# Patient Record
Sex: Female | Born: 1945
Health system: Southern US, Community
[De-identification: ages and names within clinical notes are randomized; demographics above are authoritative.]

## PROBLEM LIST (undated history)

## (undated) DIAGNOSIS — M1990 Unspecified osteoarthritis, unspecified site: Secondary | ICD-10-CM

## (undated) DIAGNOSIS — R0981 Nasal congestion: Secondary | ICD-10-CM

## (undated) DIAGNOSIS — I10 Essential (primary) hypertension: Secondary | ICD-10-CM

## (undated) DIAGNOSIS — M810 Age-related osteoporosis without current pathological fracture: Secondary | ICD-10-CM

## (undated) DIAGNOSIS — E7800 Pure hypercholesterolemia, unspecified: Secondary | ICD-10-CM

## (undated) DIAGNOSIS — M5126 Other intervertebral disc displacement, lumbar region: Secondary | ICD-10-CM

## (undated) DIAGNOSIS — R0600 Dyspnea, unspecified: Secondary | ICD-10-CM

## (undated) DIAGNOSIS — K219 Gastro-esophageal reflux disease without esophagitis: Secondary | ICD-10-CM

## (undated) HISTORY — PX: COLONOSCOPY: SHX174

## (undated) HISTORY — DX: Age-related osteoporosis without current pathological fracture: M81.0

## (undated) HISTORY — PX: OTHER SURGICAL HISTORY: SHX169

## (undated) HISTORY — PX: CATARACT EXTRACTION: SUR2

---

## 1998-07-19 ENCOUNTER — Other Ambulatory Visit: Admission: RE | Admit: 1998-07-19 | Discharge: 1998-07-19 | Payer: Self-pay | Admitting: Family Medicine

## 2008-08-12 LAB — HM COLONOSCOPY

## 2008-12-27 ENCOUNTER — Ambulatory Visit (HOSPITAL_COMMUNITY): Admission: RE | Admit: 2008-12-27 | Discharge: 2008-12-27 | Payer: Self-pay | Admitting: Ophthalmology

## 2010-08-20 LAB — HEMOGLOBIN AND HEMATOCRIT, BLOOD
HCT: 38.2 % (ref 36.0–46.0)
Hemoglobin: 13.2 g/dL (ref 12.0–15.0)

## 2010-08-20 LAB — BASIC METABOLIC PANEL
BUN: 12 mg/dL (ref 6–23)
GFR calc Af Amer: >60 mL/min (ref >60)
GFR calc non Af Amer: >60 mL/min (ref >60)
Potassium: 4.2 mEq/L (ref 3.5–5.1)
Sodium: 140 mEq/L (ref 135–145)

## 2011-04-15 ENCOUNTER — Emergency Department (HOSPITAL_COMMUNITY)
Admission: EM | Admit: 2011-04-15 | Discharge: 2011-04-15 | Disposition: A | Discharge status: Home / Self Care | Payer: Medicare Other | Attending: Emergency Medicine | Admitting: Emergency Medicine

## 2011-04-15 ENCOUNTER — Emergency Department (HOSPITAL_COMMUNITY): Payer: Medicare Other

## 2011-04-15 DIAGNOSIS — R42 Dizziness and giddiness: Secondary | ICD-10-CM | POA: Insufficient documentation

## 2011-04-15 DIAGNOSIS — E7800 Pure hypercholesterolemia, unspecified: Secondary | ICD-10-CM | POA: Insufficient documentation

## 2011-04-15 DIAGNOSIS — I10 Essential (primary) hypertension: Secondary | ICD-10-CM | POA: Insufficient documentation

## 2011-04-15 DIAGNOSIS — R51 Headache: Secondary | ICD-10-CM | POA: Insufficient documentation

## 2011-04-15 DIAGNOSIS — Z9849 Cataract extraction status, unspecified eye: Secondary | ICD-10-CM | POA: Insufficient documentation

## 2011-04-15 DIAGNOSIS — R509 Fever, unspecified: Secondary | ICD-10-CM | POA: Insufficient documentation

## 2011-04-15 DIAGNOSIS — K219 Gastro-esophageal reflux disease without esophagitis: Secondary | ICD-10-CM | POA: Insufficient documentation

## 2011-04-15 DIAGNOSIS — R112 Nausea with vomiting, unspecified: Secondary | ICD-10-CM | POA: Insufficient documentation

## 2011-04-15 HISTORY — DX: Gastro-esophageal reflux disease without esophagitis: K21.9

## 2011-04-15 HISTORY — DX: Essential (primary) hypertension: I10

## 2011-04-15 HISTORY — DX: Pure hypercholesterolemia, unspecified: E78.00

## 2011-04-15 HISTORY — DX: Nasal congestion: R09.81

## 2011-04-15 LAB — URINE MICROSCOPIC-ADD ON

## 2011-04-15 LAB — URINALYSIS, ROUTINE W REFLEX MICROSCOPIC
Nitrite: NEGATIVE
Protein, ur: NEGATIVE mg/dL
Urobilinogen, UA: 0.2 mg/dL (ref 0.0–1.0)

## 2011-04-15 MED ORDER — MECLIZINE HCL 25 MG PO TABS: 25.0000 mg | ORAL_TABLET | Freq: Three times a day (TID) | ORAL | Status: AC | PRN

## 2011-04-15 MED ORDER — MECLIZINE HCL 12.5 MG PO TABS
25.0000 mg | ORAL_TABLET | Freq: Once | ORAL | Status: AC
  Administered 2011-04-15: 25 mg via ORAL
  Filled 2011-04-15: qty 2

## 2011-04-15 NOTE — ED Notes (Signed)
Pt became dizzy while driving today, multiple other complaints, shaky at times, legs hurt, face felt hot, pressure from ear to ear--thinks may be sinus?Marland Kitchen

## 2011-04-15 NOTE — ED Provider Notes (Signed)
History  Scribed for Joya Gaskins, MD, the patient was seen in [facility]. The chart was scribed by Gilman Schmidt. The patients care was started at 9:52 PM.   CSN: [id number] Arrival date & time: 04/15/2011  9:27 PM   First MD Initiated Contact with Patient 04/15/11 2140      Chief Complaint  Patient presents with  . Dizziness    multiple other complaints     HPI Cheryl Lee is a 65 y.o. female who presents to the Emergency Department complaining of dizziness. Patient reports loss of balance ~6 hours ago and becoming dizzy while driving today. Patient also reports amultiple other complaints such as leg pain, face feeling hot, and headache (pressure). Pt has never had similar symptoms. Pt also notes numbness in left arm that has improved. Denies any LOC, vision loss, hearing loss, vomiting, or weakness in extremities. Denies any history or MI or stroke. There are no other associated symptoms and no other alleviating or aggravating factors.    Past Medical History  Diagnosis Date  . Hypercholesteremia   . Hypertension   . Acid reflux   . Sinus congestion     Past Surgical History  Procedure Date  . Cataract extraction     No family history on file.  History  Substance Use Topics  . Smoking status: Never Smoker   . Smokeless tobacco: Not on file  . Alcohol Use: No    Review of Systems  Constitutional: Positive for fever.  HENT: Negative for hearing loss.   Eyes: Negative for visual disturbance.  Gastrointestinal: Positive for nausea and vomiting.  Neurological: Positive for dizziness and headaches. Negative for syncope and weakness.  All other systems reviewed and are negative.    Allergies  Review of patient's allergies indicates not on file.  Home Medications  No current outpatient prescriptions on file.  BP 141/84  Pulse 81  Temp(Src) 98.6 F (37 C) (Oral)  Resp 17  Ht 5\' 2"  (1.575 m)  Wt 121 lb (54.885 kg)  BMI 22.13 kg/m2  SpO2 99%  Physical  Exam CONSTITUTIONAL: Well developed/well nourished HEAD AND FACE: Normocephalic/atraumatic EYES: EOMI/PERRL ENMT: Mucous membranes moist, left TM/right TM normal NECK: supple no meningeal signs, no bruits SPINE:entire spine nontender CV: S1/S2 noted, no murmurs/rubs/gallops noted LUNGS: Lungs are clear to auscultation bilaterally, no apparent distress ABDOMEN: soft, nontender, no rebound or guarding GU:no cva tenderness NEURO: no past pointing, no ataxia  Awake/alert, facies symmetric, no arm or leg drift is noted Cranial nerves 3/4/5/6/11/19/08/11/12 tested and intact Gait normal EXTREMITIES: pulses normal, full ROM SKIN: warm, color normal PSYCH: no abnormalities of mood noted   ED Course  Procedures   LABS Results for orders placed during the hospital encounter of 04/15/11  URINALYSIS, ROUTINE W REFLEX MICROSCOPIC      Component Value Range   Color, Urine YELLOW  YELLOW    APPearance CLEAR  CLEAR    Specific Gravity, Urine <1.005 (*) 1.005 - 1.030    pH 6.0  5.0 - 8.0    Glucose, UA NEGATIVE  NEGATIVE (mg/dL)   Hgb urine dipstick TRACE (*) NEGATIVE    Bilirubin Urine NEGATIVE  NEGATIVE    Ketones, ur TRACE (*) NEGATIVE (mg/dL)   Protein, ur NEGATIVE  NEGATIVE (mg/dL)   Urobilinogen, UA 0.2  0.0 - 1.0 (mg/dL)   Nitrite NEGATIVE  NEGATIVE    Leukocytes, UA TRACE (*) NEGATIVE   URINE MICROSCOPIC-ADD ON      Component Value Range  Squamous Epithelial / LPF FEW (*) RARE    WBC, UA 7-10  <3 (WBC/hpf)   RBC / HPF 3-6  <3 (RBC/hpf)   Bacteria, UA FEW (*) RARE    Radiology:  IMPRESSION: No acute intracranial findings. Original Report Authenticated By: Gerrianne Scale, M.D.   DIAGNOSTIC STUDIES: Oxygen Saturation is 100% on room air, normal by my interpretation.    COORDINATION OF CARE: 9:52pm:  - Patient evaluated by ED physician, CT Head, UA, Urine microscopic ordered   Pt well appearing, report symptoms worsened by sitting up/changing position Acute in onset,  normal gait, no motor deficits, likely peripheral vertigo  MDM  Nursing notes reviewed and considered in documentation All labs/vitals reviewed and considered   I personally performed the services described in this documentation, which was scribed in my presence. The recorded information has been reviewed and considered.          Joya Gaskins, MD 04/15/11 [phone]

## 2011-05-17 DIAGNOSIS — E785 Hyperlipidemia, unspecified: Secondary | ICD-10-CM | POA: Diagnosis not present

## 2011-05-22 DIAGNOSIS — S82899A Other fracture of unspecified lower leg, initial encounter for closed fracture: Secondary | ICD-10-CM | POA: Diagnosis not present

## 2011-06-21 DIAGNOSIS — S82899A Other fracture of unspecified lower leg, initial encounter for closed fracture: Secondary | ICD-10-CM | POA: Diagnosis not present

## 2011-06-26 DIAGNOSIS — E785 Hyperlipidemia, unspecified: Secondary | ICD-10-CM | POA: Diagnosis not present

## 2011-06-26 DIAGNOSIS — I10 Essential (primary) hypertension: Secondary | ICD-10-CM | POA: Diagnosis not present

## 2011-06-26 DIAGNOSIS — E559 Vitamin D deficiency, unspecified: Secondary | ICD-10-CM | POA: Diagnosis not present

## 2011-06-26 DIAGNOSIS — R350 Frequency of micturition: Secondary | ICD-10-CM | POA: Diagnosis not present

## 2011-07-11 DIAGNOSIS — M810 Age-related osteoporosis without current pathological fracture: Secondary | ICD-10-CM | POA: Diagnosis not present

## 2011-09-06 DIAGNOSIS — Z1231 Encounter for screening mammogram for malignant neoplasm of breast: Secondary | ICD-10-CM | POA: Diagnosis not present

## 2011-09-06 LAB — HM MAMMOGRAPHY: HM Mammogram: NEGATIVE

## 2011-09-28 DIAGNOSIS — I10 Essential (primary) hypertension: Secondary | ICD-10-CM | POA: Diagnosis not present

## 2011-09-28 DIAGNOSIS — Z23 Encounter for immunization: Secondary | ICD-10-CM | POA: Diagnosis not present

## 2011-09-28 DIAGNOSIS — E785 Hyperlipidemia, unspecified: Secondary | ICD-10-CM | POA: Diagnosis not present

## 2012-01-01 DIAGNOSIS — I10 Essential (primary) hypertension: Secondary | ICD-10-CM | POA: Diagnosis not present

## 2012-01-01 DIAGNOSIS — E559 Vitamin D deficiency, unspecified: Secondary | ICD-10-CM | POA: Diagnosis not present

## 2012-01-01 DIAGNOSIS — E785 Hyperlipidemia, unspecified: Secondary | ICD-10-CM | POA: Diagnosis not present

## 2012-04-04 DIAGNOSIS — E785 Hyperlipidemia, unspecified: Secondary | ICD-10-CM | POA: Diagnosis not present

## 2012-04-04 DIAGNOSIS — M810 Age-related osteoporosis without current pathological fracture: Secondary | ICD-10-CM | POA: Diagnosis not present

## 2012-04-04 DIAGNOSIS — I10 Essential (primary) hypertension: Secondary | ICD-10-CM | POA: Diagnosis not present

## 2012-04-04 LAB — HEPATIC FUNCTION PANEL
AST: 18 U/L (ref 13–35)
Bilirubin, Direct: 0.1 mg/dL (ref 0.01–0.4)

## 2012-04-04 LAB — BASIC METABOLIC PANEL
BUN: 21 mg/dL (ref 4–21)
Glucose: 98 mg/dL
Sodium: 138 mmol/L (ref 137–147)

## 2012-04-04 LAB — LIPID PANEL
HDL: 59 mg/dL (ref 35–70)
LDL Cholesterol: 58 mg/dL

## 2012-05-16 DIAGNOSIS — H2510 Age-related nuclear cataract, unspecified eye: Secondary | ICD-10-CM | POA: Diagnosis not present

## 2012-07-19 ENCOUNTER — Encounter: Payer: Self-pay | Admitting: Nurse Practitioner

## 2012-07-19 DIAGNOSIS — I10 Essential (primary) hypertension: Secondary | ICD-10-CM | POA: Insufficient documentation

## 2012-07-19 DIAGNOSIS — E785 Hyperlipidemia, unspecified: Secondary | ICD-10-CM | POA: Insufficient documentation

## 2012-07-19 LAB — HM DEXA SCAN: HM DEXA SCAN: NORMAL

## 2012-08-04 ENCOUNTER — Encounter: Payer: Self-pay | Admitting: Nurse Practitioner

## 2012-08-04 ENCOUNTER — Ambulatory Visit (INDEPENDENT_AMBULATORY_CARE_PROVIDER_SITE_OTHER): Payer: Medicare Other | Admitting: Nurse Practitioner

## 2012-08-04 VITALS — BP 139/83 | HR 78 | Temp 99.0°F | Ht 60.0 in | Wt 133.0 lb

## 2012-08-04 DIAGNOSIS — K219 Gastro-esophageal reflux disease without esophagitis: Secondary | ICD-10-CM

## 2012-08-04 DIAGNOSIS — E785 Hyperlipidemia, unspecified: Secondary | ICD-10-CM | POA: Diagnosis not present

## 2012-08-04 DIAGNOSIS — M810 Age-related osteoporosis without current pathological fracture: Secondary | ICD-10-CM | POA: Diagnosis not present

## 2012-08-04 DIAGNOSIS — I10 Essential (primary) hypertension: Secondary | ICD-10-CM | POA: Diagnosis not present

## 2012-08-04 DIAGNOSIS — Z1231 Encounter for screening mammogram for malignant neoplasm of breast: Secondary | ICD-10-CM

## 2012-08-04 LAB — COMPLETE METABOLIC PANEL WITH GFR
AST: 14 U/L (ref 0–37)
Alkaline Phosphatase: 55 U/L (ref 39–117)
BUN: 20 mg/dL (ref 6–23)
Creat: 0.86 mg/dL (ref 0.50–1.10)

## 2012-08-04 NOTE — Progress Notes (Signed)
  Subjective:    Patient ID: Cheryl Lee, female    DOB: 04-Feb-1946, 67 y.o.   MRN: [id number]  Hypertension This is a chronic problem. The current episode started more than 1 year ago. The problem is unchanged. The problem is controlled. Associated symptoms include peripheral edema. Pertinent negatives include no anxiety, blurred vision, chest pain, malaise/fatigue, neck pain, palpitations or shortness of breath. There are no associated agents to hypertension. Risk factors for coronary artery disease include dyslipidemia. Past treatments include ACE inhibitors and diuretics. The current treatment provides significant improvement. There are no compliance problems.   Hyperlipidemia This is a chronic problem. The current episode started more than 1 year ago. The problem is controlled. Recent lipid tests were reviewed and are normal. There are no known factors aggravating her hyperlipidemia. Pertinent negatives include no chest pain, myalgias or shortness of breath. Current antihyperlipidemic treatment includes statins. The current treatment provides significant improvement of lipids. There are no compliance problems.  Risk factors for coronary artery disease include hypertension.  OSTEOPOROSIS Currently on alendronate. Tolerating well. Last DEXA was 07/11/11 GERD Currently on omeprazole and paient says if doesn't take then she will start vomiting after meals  Review of Systems  Constitutional: Negative for malaise/fatigue.  HENT: Negative for neck pain.   Eyes: Negative for blurred vision.  Respiratory: Negative for shortness of breath.   Cardiovascular: Negative for chest pain and palpitations.  Musculoskeletal: Negative for myalgias.  All other systems reviewed and are negative.       Objective:   Physical Exam  Vitals reviewed. Constitutional: She is oriented to person, place, and time. She appears well-developed and well-nourished.  HENT:  Head: Normocephalic.  Right Ear: External  ear normal.  Left Ear: External ear normal.  Nose: Nose normal.  Mouth/Throat: Oropharynx is clear and moist.  Eyes: Pupils are equal, round, and reactive to light.  Neck: Normal range of motion. Neck supple. No JVD present.  Cardiovascular: Normal rate, regular rhythm, normal heart sounds and intact distal pulses.  Exam reveals no gallop and no friction rub.   No murmur heard. No peripheral edema bil  Pulmonary/Chest: Effort normal and breath sounds normal.  Abdominal: Soft. Bowel sounds are normal.  Musculoskeletal: Normal range of motion. She exhibits no edema.  Neurological: She is alert and oriented to person, place, and time.  Skin: Skin is warm and dry.  Psychiatric: She has a normal mood and affect. Her behavior is normal. Judgment and thought content normal.  BP 139/83  Pulse 78  Temp(Src) 99 F (37.2 C) (Oral)  Ht 5' (1.524 m)  Wt 133 lb (60.328 kg)  BMI 25.97 kg/m2  LMP 08/05/1991         Assessment & Plan:  1. Essential hypertension, benig - COMPLETE METABOLIC PANEL WITH GFR  2. Other and unspecified hyperlipidemi - NMR Lipoprofile with Lipids  3. Osteoporosis, unspecifie  4. GERD (gastroesophageal reflux diseas  5. Other screening mammogra - MM Digital Screening; Future  Continue all meds Labs pending Diet encouraged F/U 3 months and prn  Mary-Margaret Daphine Deutscher, FNP

## 2012-08-04 NOTE — Patient Instructions (Signed)
Hypertriglyceridemia  Diet for High blood levels of Triglycerides Most fats in food are triglycerides. Triglycerides in your blood are stored as fat in your body. High levels of triglycerides in your blood may put you at a greater risk for heart disease and stroke.  Normal triglyceride levels are less than 150 mg/dL. Borderline high levels are 150-199 mg/dl. High levels are 200 - 499 mg/dL, and very high triglyceride levels are greater than 500 mg/dL. The decision to treat high triglycerides is generally based on the level. For people with borderline or high triglyceride levels, treatment includes weight loss and exercise. Drugs are recommended for people with very high triglyceride levels. Many people who need treatment for high triglyceride levels have metabolic syndrome. This syndrome is a collection of disorders that often include: insulin resistance, high blood pressure, blood clotting problems, high cholesterol and triglycerides. TESTING PROCEDURE FOR TRIGLYCERIDES  You should not eat 4 hours before getting your triglycerides measured. The normal range of triglycerides is between 10 and 250 milligrams per deciliter (mg/dl). Some people may have extreme levels (1000 or above), but your triglyceride level may be too high if it is above 150 mg/dl, depending on what other risk factors you have for heart disease.  People with high blood triglycerides may also have high blood cholesterol levels. If you have high blood cholesterol as well as high blood triglycerides, your risk for heart disease is probably greater than if you only had high triglycerides. High blood cholesterol is one of the main risk factors for heart disease. CHANGING YOUR DIET  Your weight can affect your blood triglyceride level. If you are more than 20% above your ideal body weight, you may be able to lower your blood triglycerides by losing weight. Eating less and exercising regularly is the best way to combat this. Fat provides more  calories than any other food. The best way to lose weight is to eat less fat. Only 30% of your total calories should come from fat. Less than 7% of your diet should come from saturated fat. A diet low in fat and saturated fat is the same as a diet to decrease blood cholesterol. By eating a diet lower in fat, you may lose weight, lower your blood cholesterol, and lower your blood triglyceride level.  Eating a diet low in fat, especially saturated fat, may also help you lower your blood triglyceride level. Ask your dietitian to help you figure how much fat you can eat based on the number of calories your caregiver has prescribed for you.  Exercise, in addition to helping with weight loss may also help lower triglyceride levels.   Alcohol can increase blood triglycerides. You may need to stop drinking alcoholic beverages.  Too much carbohydrate in your diet may also increase your blood triglycerides. Some complex carbohydrates are necessary in your diet. These may include bread, rice, potatoes, other starchy vegetables and cereals.  Reduce "simple" carbohydrates. These may include pure sugars, candy, honey, and jelly without losing other nutrients. If you have the kind of high blood triglycerides that is affected by the amount of carbohydrates in your diet, you will need to eat less sugar and less high-sugar foods. Your caregiver can help you with this.  Adding 2-4 grams of fish oil (EPA+ DHA) may also help lower triglycerides. Speak with your caregiver before adding any supplements to your regimen. Following the Diet  Maintain your ideal weight. Your caregivers can help you with a diet. Generally, eating less food and getting more   exercise will help you lose weight. Joining a weight control group may also help. Ask your caregivers for a good weight control group in your area.  Eat low-fat foods instead of high-fat foods. This can help you lose weight too.  These foods are lower in fat. Eat MORE of these:    Dried beans, peas, and lentils.  Egg whites.  Low-fat cottage cheese.  Fish.  Lean cuts of meat, such as round, sirloin, rump, and flank (cut extra fat off meat you fix).  Whole grain breads, cereals and pasta.  Skim and nonfat dry milk.  Low-fat yogurt.  Poultry without the skin.  Cheese made with skim or part-skim milk, such as mozzarella, parmesan, farmers', ricotta, or pot cheese. These are higher fat foods. Eat LESS of these:   Whole milk and foods made from whole milk, such as American, blue, cheddar, monterey jack, and swiss cheese  High-fat meats, such as luncheon meats, sausages, knockwurst, bratwurst, hot dogs, ribs, corned beef, ground pork, and regular ground beef.  Fried foods. Limit saturated fats in your diet. Substituting unsaturated fat for saturated fat may decrease your blood triglyceride level. You will need to read package labels to know which products contain saturated fats.  These foods are high in saturated fat. Eat LESS of these:   Fried pork skins.  Whole milk.  Skin and fat from poultry.  Palm oil.  Butter.  Shortening.  Cream cheese.  Bacon.  Margarines and baked goods made from listed oils.  Vegetable shortenings.  Chitterlings.  Fat from meats.  Coconut oil.  Palm kernel oil.  Lard.  Cream.  Sour cream.  Fatback.  Coffee whiteners and non-dairy creamers made with these oils.  Cheese made from whole milk. Use unsaturated fats (both polyunsaturated and monounsaturated) moderately. Remember, even though unsaturated fats are better than saturated fats; you still want a diet low in total fat.  These foods are high in unsaturated fat:   Canola oil.  Sunflower oil.  Mayonnaise.  Almonds.  Peanuts.  Pine nuts.  Margarines made with these oils.  Safflower oil.  Olive oil.  Avocados.  Cashews.  Peanut butter.  Sunflower seeds.  Soybean oil.  Peanut  oil.  Olives.  Pecans.  Walnuts.  Pumpkin seeds. Avoid sugar and other high-sugar foods. This will decrease carbohydrates without decreasing other nutrients. Sugar in your food goes rapidly to your blood. When there is excess sugar in your blood, your liver may use it to make more triglycerides. Sugar also contains calories without other important nutrients.  Eat LESS of these:   Sugar, brown sugar, powdered sugar, jam, jelly, preserves, honey, syrup, molasses, pies, candy, cakes, cookies, frosting, pastries, colas, soft drinks, punches, fruit drinks, and regular gelatin.  Avoid alcohol. Alcohol, even more than sugar, may increase blood triglycerides. In addition, alcohol is high in calories and low in nutrients. Ask for sparkling water, or a diet soft drink instead of an alcoholic beverage. Suggestions for planning and preparing meals   Bake, broil, grill or roast meats instead of frying.  Remove fat from meats and skin from poultry before cooking.  Add spices, herbs, lemon juice or vinegar to vegetables instead of salt, rich sauces or gravies.  Use a non-stick skillet without fat or use no-stick sprays.  Cool and refrigerate stews and broth. Then remove the hardened fat floating on the surface before serving.  Refrigerate meat drippings and skim off fat to make low-fat gravies.  Serve more fish.  Use less butter,   margarine and other high-fat spreads on bread or vegetables.  Use skim or reconstituted non-fat dry milk for cooking.  Cook with low-fat cheeses.  Substitute low-fat yogurt or cottage cheese for all or part of the sour cream in recipes for sauces, dips or congealed salads.  Use half yogurt/half mayonnaise in salad recipes.  Substitute evaporated skim milk for cream. Evaporated skim milk or reconstituted non-fat dry milk can be whipped and substituted for whipped cream in certain recipes.  Choose fresh fruits for dessert instead of high-fat foods such as pies or  cakes. Fruits are naturally low in fat. When Dining Out   Order low-fat appetizers such as fruit or vegetable juice, pasta with vegetables or tomato sauce.  Select clear, rather than cream soups.  Ask that dressings and gravies be served on the side. Then use less of them.  Order foods that are baked, broiled, poached, steamed, stir-fried, or roasted.  Ask for margarine instead of butter, and use only a small amount.  Drink sparkling water, unsweetened tea or coffee, or diet soft drinks instead of alcohol or other sweet beverages. QUESTIONS AND ANSWERS ABOUT OTHER FATS IN THE BLOOD: SATURATED FAT, TRANS FAT, AND CHOLESTEROL What is trans fat? Trans fat is a type of fat that is formed when vegetable oil is hardened through a process called hydrogenation. This process helps makes foods more solid, gives them shape, and prolongs their shelf life. Trans fats are also called hydrogenated or partially hydrogenated oils.  What do saturated fat, trans fat, and cholesterol in foods have to do with heart disease? Saturated fat, trans fat, and cholesterol in the diet all raise the level of LDL "bad" cholesterol in the blood. The higher the LDL cholesterol, the greater the risk for coronary heart disease (CHD). Saturated fat and trans fat raise LDL similarly.  What foods contain saturated fat, trans fat, and cholesterol? High amounts of saturated fat are found in animal products, such as fatty cuts of meat, chicken skin, and full-fat dairy products like butter, whole milk, cream, and cheese, and in tropical vegetable oils such as palm, palm kernel, and coconut oil. Trans fat is found in some of the same foods as saturated fat, such as vegetable shortening, some margarines (especially hard or stick margarine), crackers, cookies, baked goods, fried foods, salad dressings, and other processed foods made with partially hydrogenated vegetable oils. Small amounts of trans fat also occur naturally in some animal  products, such as milk products, beef, and lamb. Foods high in cholesterol include liver, other organ meats, egg yolks, shrimp, and full-fat dairy products. How can I use the new food label to make heart-healthy food choices? Check the Nutrition Facts panel of the food label. Choose foods lower in saturated fat, trans fat, and cholesterol. For saturated fat and cholesterol, you can also use the Percent Daily Value (%DV): 5% DV or less is low, and 20% DV or more is high. (There is no %DV for trans fat.) Use the Nutrition Facts panel to choose foods low in saturated fat and cholesterol, and if the trans fat is not listed, read the ingredients and limit products that list shortening or hydrogenated or partially hydrogenated vegetable oil, which tend to be high in trans fat. POINTS TO REMEMBER:   Discuss your risk for heart disease with your caregivers, and take steps to reduce risk factors.  Change your diet. Choose foods that are low in saturated fat, trans fat, and cholesterol.  Add exercise to your daily routine if   it is not already being done. Participate in physical activity of moderate intensity, like brisk walking, for at least 30 minutes on most, and preferably all days of the week. No time? Break the 30 minutes into three, 10-minute segments during the day.  Stop smoking. If you do smoke, contact your caregiver to discuss ways in which they can help you quit.  Do not use street drugs.  Maintain a normal weight.  Maintain a healthy blood pressure.  Keep up with your blood work for checking the fats in your blood as directed by your caregiver. Document Released: 02/16/2004 Document Revised: 10/30/2011 Document Reviewed: 09/13/2008 [employer] Patient Information 2013 [employer].  

## 2012-08-07 LAB — NMR LIPOPROFILE WITH LIPIDS
Cholesterol, Total: 159 mg/dL (ref <200)
LDL (calc): 63 mg/dL (ref <100)
LDL Particle Number: 846 nmol/L (ref <1000)
LP-IR Score: 46 — ABNORMAL HIGH (ref <=45)
Small LDL Particle Number: 483 nmol/L (ref <=527)
Triglycerides: 144 mg/dL (ref <150)
VLDL Size: 44.8 nm (ref >=46.6)

## 2012-10-01 DIAGNOSIS — Z1231 Encounter for screening mammogram for malignant neoplasm of breast: Secondary | ICD-10-CM | POA: Diagnosis not present

## 2012-10-07 ENCOUNTER — Telehealth: Payer: Self-pay | Admitting: Nurse Practitioner

## 2012-10-07 ENCOUNTER — Ambulatory Visit (INDEPENDENT_AMBULATORY_CARE_PROVIDER_SITE_OTHER): Payer: Medicare Other

## 2012-10-07 ENCOUNTER — Encounter: Payer: Self-pay | Admitting: Physician Assistant

## 2012-10-07 ENCOUNTER — Ambulatory Visit (INDEPENDENT_AMBULATORY_CARE_PROVIDER_SITE_OTHER): Payer: Medicare Other | Admitting: Physician Assistant

## 2012-10-07 VITALS — BP 119/89 | HR 102 | Temp 97.8°F

## 2012-10-07 DIAGNOSIS — S8990XA Unspecified injury of unspecified lower leg, initial encounter: Secondary | ICD-10-CM

## 2012-10-07 DIAGNOSIS — S82409A Unspecified fracture of shaft of unspecified fibula, initial encounter for closed fracture: Secondary | ICD-10-CM | POA: Diagnosis not present

## 2012-10-07 DIAGNOSIS — S99911A Unspecified injury of right ankle, initial encounter: Secondary | ICD-10-CM

## 2012-10-07 DIAGNOSIS — S99919A Unspecified injury of unspecified ankle, initial encounter: Secondary | ICD-10-CM | POA: Diagnosis not present

## 2012-10-07 DIAGNOSIS — S99929A Unspecified injury of unspecified foot, initial encounter: Secondary | ICD-10-CM | POA: Diagnosis not present

## 2012-10-07 DIAGNOSIS — S82401A Unspecified fracture of shaft of right fibula, initial encounter for closed fracture: Secondary | ICD-10-CM

## 2012-10-07 NOTE — Telephone Encounter (Signed)
Apt made

## 2012-10-07 NOTE — Progress Notes (Signed)
Subjective:     Patient ID: Cheryl Lee, female   DOB: [birth year], 67 y.o.   MRN: [id number]  HPI Pt was mowing lawn Sat when she stepped in a hole twisting the R foot/ankle Pt noted pain and swelling to the area She has soaked and taken Tylenol for pain relief Sx are sig worse and to the lateral ankle with weight bearing Due to cont sx here for review  Review of Systems  All other systems reviewed and are negative.      Objective:   Physical Exam + edema/ecchy to the R foot/ankle lateral Good pulses Sensory intact Decrease in ROM due to sx ++ TTP distal fibula No TTP medial ankle, foot, or Achilles Xray- + fx     Assessment:     1. Closed fibular fracture, right, initial encounter   2. Right ankle injury, initial encounter        Plan:     Referral to Dr Simonne Come per pt request She has walking boot at home  No weight bearing She states Tylenol is enough for her sx Work note- out until further notice F/U prn

## 2012-10-07 NOTE — Patient Instructions (Signed)
Fibular Fracture, Ankle, Adult, Treated with or without Immobilization  You have a fracture (break) of your fibula. This is the bone in your lower leg located on the outside of the leg. These fractures are easily diagnosed with x-rays.  TREATMENT   You have a simple fracture of the part of the fibula which is located between the knee and ankle. This bone usually will heal without problems and can often be treated without casting or splinting. This means the fracture will heal well during normal use and daily activities without being held in place. Sometimes a cast or splint is placed on these fractures if it is needed for comfort or if the bones are badly out of place.  HOME CARE INSTRUCTIONS    Apply ice to the injury for 15-20 minutes, 3-4 times per day while awake, for 2 days. Put the ice in a plastic bag and place a thin towel between the bag of ice and your leg. This helps keep swelling down.   Use crutches as directed. Resume walking without crutches as directed by your caregiver or when comfortable doing so.   Only take over-the-counter or prescription medicines for pain, discomfort, or fever as directed by your caregiver.   Keep appointments for follow up x-rays if these are required.   If you have a removable splint or boot, do not remove the boot unless directed by your caregiver.   Warning: Do not drive a car or operate a motor vehicle until your caregiver specifically tells you it is safe to do so.  SEEK MEDICAL CARE IF:    You have continued severe pain or more swelling   The medications do not control the pain.   Your skin or nails below the injury turn blue or grey or feel cold or numb.   You develop severe pain in the leg or foot.  MAKE SURE YOU:    Understand these instructions.   Will watch your condition.   Will get help right away if you are not doing well or get worse.  Document Released: 04/30/2005 Document Revised: 07/23/2011 Document Reviewed: 12/05/2007  [employer] Patient  Information 2014 [employer].

## 2012-10-08 DIAGNOSIS — S8263XA Displaced fracture of lateral malleolus of unspecified fibula, initial encounter for closed fracture: Secondary | ICD-10-CM | POA: Diagnosis not present

## 2012-10-10 ENCOUNTER — Telehealth: Payer: Self-pay | Admitting: Nurse Practitioner

## 2012-10-10 NOTE — Telephone Encounter (Signed)
Patient aware.

## 2012-10-14 DIAGNOSIS — Y999 Unspecified external cause status: Secondary | ICD-10-CM | POA: Diagnosis not present

## 2012-10-14 DIAGNOSIS — Y929 Unspecified place or not applicable: Secondary | ICD-10-CM | POA: Diagnosis not present

## 2012-10-14 DIAGNOSIS — Y939 Activity, unspecified: Secondary | ICD-10-CM | POA: Diagnosis not present

## 2012-10-14 DIAGNOSIS — X58XXXA Exposure to other specified factors, initial encounter: Secondary | ICD-10-CM | POA: Diagnosis not present

## 2012-10-14 DIAGNOSIS — S8263XA Displaced fracture of lateral malleolus of unspecified fibula, initial encounter for closed fracture: Secondary | ICD-10-CM | POA: Diagnosis not present

## 2012-10-29 ENCOUNTER — Other Ambulatory Visit: Payer: Self-pay | Admitting: Nurse Practitioner

## 2012-11-03 DIAGNOSIS — S8290XD Unspecified fracture of unspecified lower leg, subsequent encounter for closed fracture with routine healing: Secondary | ICD-10-CM | POA: Diagnosis not present

## 2012-11-05 ENCOUNTER — Ambulatory Visit: Payer: BLUE CROSS/BLUE SHIELD | Admitting: Nurse Practitioner

## 2012-12-05 DIAGNOSIS — S8290XD Unspecified fracture of unspecified lower leg, subsequent encounter for closed fracture with routine healing: Secondary | ICD-10-CM | POA: Diagnosis not present

## 2012-12-09 ENCOUNTER — Ambulatory Visit: Payer: Medicare Other | Attending: Orthopedic Surgery | Admitting: Physical Therapy

## 2012-12-09 DIAGNOSIS — M25579 Pain in unspecified ankle and joints of unspecified foot: Secondary | ICD-10-CM | POA: Insufficient documentation

## 2012-12-09 DIAGNOSIS — M25673 Stiffness of unspecified ankle, not elsewhere classified: Secondary | ICD-10-CM | POA: Insufficient documentation

## 2012-12-09 DIAGNOSIS — IMO0001 Reserved for inherently not codable concepts without codable children: Secondary | ICD-10-CM | POA: Insufficient documentation

## 2012-12-09 DIAGNOSIS — M25676 Stiffness of unspecified foot, not elsewhere classified: Secondary | ICD-10-CM | POA: Insufficient documentation

## 2012-12-09 DIAGNOSIS — R269 Unspecified abnormalities of gait and mobility: Secondary | ICD-10-CM | POA: Insufficient documentation

## 2012-12-09 DIAGNOSIS — R5381 Other malaise: Secondary | ICD-10-CM | POA: Insufficient documentation

## 2012-12-12 ENCOUNTER — Ambulatory Visit: Payer: Medicare Other | Attending: Orthopedic Surgery | Admitting: Physical Therapy

## 2012-12-12 DIAGNOSIS — R5381 Other malaise: Secondary | ICD-10-CM | POA: Diagnosis not present

## 2012-12-12 DIAGNOSIS — R269 Unspecified abnormalities of gait and mobility: Secondary | ICD-10-CM | POA: Diagnosis not present

## 2012-12-12 DIAGNOSIS — IMO0001 Reserved for inherently not codable concepts without codable children: Secondary | ICD-10-CM | POA: Diagnosis not present

## 2012-12-12 DIAGNOSIS — M25673 Stiffness of unspecified ankle, not elsewhere classified: Secondary | ICD-10-CM | POA: Diagnosis not present

## 2012-12-12 DIAGNOSIS — M25579 Pain in unspecified ankle and joints of unspecified foot: Secondary | ICD-10-CM | POA: Insufficient documentation

## 2012-12-12 DIAGNOSIS — M25676 Stiffness of unspecified foot, not elsewhere classified: Secondary | ICD-10-CM | POA: Insufficient documentation

## 2012-12-16 ENCOUNTER — Ambulatory Visit: Payer: Medicare Other | Admitting: Physical Therapy

## 2012-12-18 ENCOUNTER — Ambulatory Visit: Payer: Medicare Other | Admitting: Physical Therapy

## 2012-12-23 ENCOUNTER — Ambulatory Visit: Payer: Medicare Other

## 2012-12-25 ENCOUNTER — Ambulatory Visit: Payer: Medicare Other

## 2012-12-30 ENCOUNTER — Ambulatory Visit: Payer: Medicare Other | Admitting: Physical Therapy

## 2013-01-01 ENCOUNTER — Ambulatory Visit: Payer: Medicare Other | Admitting: Physical Therapy

## 2013-01-05 DIAGNOSIS — S8290XD Unspecified fracture of unspecified lower leg, subsequent encounter for closed fracture with routine healing: Secondary | ICD-10-CM | POA: Diagnosis not present

## 2013-01-07 ENCOUNTER — Ambulatory Visit: Payer: Medicare Other

## 2013-01-09 ENCOUNTER — Ambulatory Visit: Payer: Medicare Other

## 2013-01-15 ENCOUNTER — Ambulatory Visit: Payer: Medicare Other | Attending: Orthopedic Surgery | Admitting: Physical Therapy

## 2013-01-15 DIAGNOSIS — M25673 Stiffness of unspecified ankle, not elsewhere classified: Secondary | ICD-10-CM | POA: Diagnosis not present

## 2013-01-15 DIAGNOSIS — IMO0001 Reserved for inherently not codable concepts without codable children: Secondary | ICD-10-CM | POA: Diagnosis not present

## 2013-01-15 DIAGNOSIS — R269 Unspecified abnormalities of gait and mobility: Secondary | ICD-10-CM | POA: Insufficient documentation

## 2013-01-15 DIAGNOSIS — M25579 Pain in unspecified ankle and joints of unspecified foot: Secondary | ICD-10-CM | POA: Diagnosis not present

## 2013-01-15 DIAGNOSIS — R5381 Other malaise: Secondary | ICD-10-CM | POA: Insufficient documentation

## 2013-01-15 DIAGNOSIS — M25676 Stiffness of unspecified foot, not elsewhere classified: Secondary | ICD-10-CM | POA: Insufficient documentation

## 2013-01-20 ENCOUNTER — Ambulatory Visit: Payer: Medicare Other | Admitting: *Deleted

## 2013-01-20 DIAGNOSIS — IMO0001 Reserved for inherently not codable concepts without codable children: Secondary | ICD-10-CM | POA: Diagnosis not present

## 2013-01-20 DIAGNOSIS — R269 Unspecified abnormalities of gait and mobility: Secondary | ICD-10-CM | POA: Diagnosis not present

## 2013-01-20 DIAGNOSIS — M25579 Pain in unspecified ankle and joints of unspecified foot: Secondary | ICD-10-CM | POA: Diagnosis not present

## 2013-01-20 DIAGNOSIS — M25673 Stiffness of unspecified ankle, not elsewhere classified: Secondary | ICD-10-CM | POA: Diagnosis not present

## 2013-01-20 DIAGNOSIS — R5381 Other malaise: Secondary | ICD-10-CM | POA: Diagnosis not present

## 2013-01-22 ENCOUNTER — Ambulatory Visit: Payer: Medicare Other | Admitting: *Deleted

## 2013-01-22 DIAGNOSIS — M25579 Pain in unspecified ankle and joints of unspecified foot: Secondary | ICD-10-CM | POA: Diagnosis not present

## 2013-01-22 DIAGNOSIS — R269 Unspecified abnormalities of gait and mobility: Secondary | ICD-10-CM | POA: Diagnosis not present

## 2013-01-22 DIAGNOSIS — R5381 Other malaise: Secondary | ICD-10-CM | POA: Diagnosis not present

## 2013-01-22 DIAGNOSIS — IMO0001 Reserved for inherently not codable concepts without codable children: Secondary | ICD-10-CM | POA: Diagnosis not present

## 2013-01-22 DIAGNOSIS — M25673 Stiffness of unspecified ankle, not elsewhere classified: Secondary | ICD-10-CM | POA: Diagnosis not present

## 2013-01-27 ENCOUNTER — Ambulatory Visit: Payer: Medicare Other

## 2013-01-27 DIAGNOSIS — R269 Unspecified abnormalities of gait and mobility: Secondary | ICD-10-CM | POA: Diagnosis not present

## 2013-01-27 DIAGNOSIS — M25579 Pain in unspecified ankle and joints of unspecified foot: Secondary | ICD-10-CM | POA: Diagnosis not present

## 2013-01-27 DIAGNOSIS — R5381 Other malaise: Secondary | ICD-10-CM | POA: Diagnosis not present

## 2013-01-27 DIAGNOSIS — M25673 Stiffness of unspecified ankle, not elsewhere classified: Secondary | ICD-10-CM | POA: Diagnosis not present

## 2013-01-27 DIAGNOSIS — IMO0001 Reserved for inherently not codable concepts without codable children: Secondary | ICD-10-CM | POA: Diagnosis not present

## 2013-01-29 ENCOUNTER — Ambulatory Visit: Payer: Medicare Other

## 2013-01-29 DIAGNOSIS — R5381 Other malaise: Secondary | ICD-10-CM | POA: Diagnosis not present

## 2013-01-29 DIAGNOSIS — M25579 Pain in unspecified ankle and joints of unspecified foot: Secondary | ICD-10-CM | POA: Diagnosis not present

## 2013-01-29 DIAGNOSIS — R269 Unspecified abnormalities of gait and mobility: Secondary | ICD-10-CM | POA: Diagnosis not present

## 2013-01-29 DIAGNOSIS — IMO0001 Reserved for inherently not codable concepts without codable children: Secondary | ICD-10-CM | POA: Diagnosis not present

## 2013-01-29 DIAGNOSIS — M25673 Stiffness of unspecified ankle, not elsewhere classified: Secondary | ICD-10-CM | POA: Diagnosis not present

## 2013-02-03 ENCOUNTER — Ambulatory Visit: Payer: Medicare Other

## 2013-02-03 DIAGNOSIS — R269 Unspecified abnormalities of gait and mobility: Secondary | ICD-10-CM | POA: Diagnosis not present

## 2013-02-03 DIAGNOSIS — M25673 Stiffness of unspecified ankle, not elsewhere classified: Secondary | ICD-10-CM | POA: Diagnosis not present

## 2013-02-03 DIAGNOSIS — IMO0001 Reserved for inherently not codable concepts without codable children: Secondary | ICD-10-CM | POA: Diagnosis not present

## 2013-02-03 DIAGNOSIS — R5381 Other malaise: Secondary | ICD-10-CM | POA: Diagnosis not present

## 2013-02-03 DIAGNOSIS — M25579 Pain in unspecified ankle and joints of unspecified foot: Secondary | ICD-10-CM | POA: Diagnosis not present

## 2013-02-04 DIAGNOSIS — S8290XD Unspecified fracture of unspecified lower leg, subsequent encounter for closed fracture with routine healing: Secondary | ICD-10-CM | POA: Diagnosis not present

## 2013-02-06 ENCOUNTER — Ambulatory Visit (INDEPENDENT_AMBULATORY_CARE_PROVIDER_SITE_OTHER): Payer: Medicare Other | Admitting: Nurse Practitioner

## 2013-02-06 ENCOUNTER — Encounter: Payer: Self-pay | Admitting: Nurse Practitioner

## 2013-02-06 VITALS — BP 116/76 | HR 89 | Temp 96.9°F | Ht 60.0 in | Wt 121.0 lb

## 2013-02-06 DIAGNOSIS — I10 Essential (primary) hypertension: Secondary | ICD-10-CM | POA: Diagnosis not present

## 2013-02-06 DIAGNOSIS — M810 Age-related osteoporosis without current pathological fracture: Secondary | ICD-10-CM | POA: Diagnosis not present

## 2013-02-06 DIAGNOSIS — K219 Gastro-esophageal reflux disease without esophagitis: Secondary | ICD-10-CM | POA: Diagnosis not present

## 2013-02-06 DIAGNOSIS — E785 Hyperlipidemia, unspecified: Secondary | ICD-10-CM | POA: Diagnosis not present

## 2013-02-06 MED ORDER — LISINOPRIL-HYDROCHLOROTHIAZIDE 20-12.5 MG PO TABS: 0.5000 | ORAL_TABLET | Freq: Every day | ORAL | Status: DC

## 2013-02-06 MED ORDER — ALENDRONATE SODIUM 70 MG PO TABS: 70.0000 mg | ORAL_TABLET | ORAL | Status: DC

## 2013-02-06 MED ORDER — MOMETASONE FUROATE 50 MCG/ACT NA SUSP: 2.0000 | Freq: Every day | NASAL | Status: DC

## 2013-02-06 MED ORDER — OMEPRAZOLE 20 MG PO CPDR: 20.0000 mg | DELAYED_RELEASE_CAPSULE | Freq: Every day | ORAL | Status: DC

## 2013-02-06 MED ORDER — ATORVASTATIN CALCIUM 40 MG PO TABS: 40.0000 mg | ORAL_TABLET | Freq: Every day | ORAL | Status: DC

## 2013-02-06 NOTE — Patient Instructions (Addendum)

## 2013-02-06 NOTE — Progress Notes (Signed)
Subjective:    Patient ID: Cheryl Lee, female    DOB: 02-03-1946, 67 y.o.   MRN: [id number]  Hypertension This is a chronic problem. The current episode started more than 1 year ago. The problem has been resolved since onset. The problem is controlled. Associated symptoms include peripheral edema. Pertinent negatives include no anxiety, blurred vision, chest pain, malaise/fatigue, neck pain, palpitations or shortness of breath. There are no associated agents to hypertension. Risk factors for coronary artery disease include dyslipidemia and post-menopausal state. Past treatments include ACE inhibitors and diuretics. The current treatment provides significant improvement. There are no compliance problems.  There is no history of a thyroid problem.  Hyperlipidemia This is a chronic problem. The current episode started more than 1 year ago. The problem is controlled. Recent lipid tests were reviewed and are normal. She has no history of diabetes. There are no known factors aggravating her hyperlipidemia. Pertinent negatives include no chest pain, myalgias or shortness of breath. Current antihyperlipidemic treatment includes statins. The current treatment provides significant improvement of lipids. There are no compliance problems.  Risk factors for coronary artery disease include hypertension.  Gastrophageal Reflux She reports no chest pain, no coughing, no heartburn or no nausea. This is a chronic problem. The current episode started more than 1 year ago. The problem occurs rarely. The problem has been resolved. The symptoms are aggravated by certain foods. She has tried a PPI for the symptoms. The treatment provided significant relief.  OSTEOPOROSIS Currently on alendronate. Tolerating well. Last DEXA was 07/11/11   Review of Systems  Constitutional: Negative for malaise/fatigue.  HENT: Negative for neck pain.   Eyes: Negative for blurred vision.  Respiratory: Negative for cough and shortness of  breath.   Cardiovascular: Negative for chest pain and palpitations.  Gastrointestinal: Negative for heartburn and nausea.  Musculoskeletal: Negative for myalgias.  All other systems reviewed and are negative.       Objective:   Physical Exam  Vitals reviewed. Constitutional: She is oriented to person, place, and time. She appears well-developed and well-nourished.  HENT:  Head: Normocephalic.  Right Ear: External ear normal.  Left Ear: External ear normal.  Nose: Nose normal.  Mouth/Throat: Oropharynx is clear and moist.  Eyes: Pupils are equal, round, and reactive to light.  Neck: Normal range of motion. Neck supple. No JVD present.  Cardiovascular: Normal rate, regular rhythm, normal heart sounds and intact distal pulses.  Exam reveals no gallop and no friction rub.   No murmur heard. No peripheral edema bil  Pulmonary/Chest: Effort normal and breath sounds normal.  Abdominal: Soft. Bowel sounds are normal.  Musculoskeletal: Normal range of motion. She exhibits edema.  Trace amt of swelling bil ankles-broke right ankle 10/04/12  Neurological: She is alert and oriented to person, place, and time.  Skin: Skin is warm and dry.  Psychiatric: She has a normal mood and affect. Her behavior is normal. Judgment and thought content normal.   BP 116/76  Pulse 89  Temp(Src) 96.9 F (36.1 C) (Oral)  Ht 5' (1.524 m)  Wt 121 lb (54.885 kg)  BMI 23.63 kg/m2  LMP 08/05/1991        Assessment & Plan:   1. Essential hypertension, benign   2. GERD (gastroesophageal reflux disease)   3. Hyperlipidemia LDL goal < 100   4. Osteoporosis, unspecified    Orders Placed This Encounter  Procedures  . CMP14+EGFR  . NMR, lipoprofile   Meds ordered this encounter  Medications  . mometasone (  NASONEX) 50 MCG/ACT nasal spray    Sig: Place 2 sprays into the nose daily.    Dispense:  52 g    Refill:  1    Order Specific Question:  Supervising Provider    Answer:  Ernestina Penna [[id number]]   . alendronate (FOSAMAX) 70 MG tablet    Sig: Take 1 tablet (70 mg total) by mouth every 7 (seven) days. Take with a full glass of water on an empty stomach.    Dispense:  12 tablet    Refill:  1    Order Specific Question:  Supervising Provider    Answer:  Ernestina Penna [[id number]]  . omeprazole (PRILOSEC) 20 MG capsule    Sig: Take 1 capsule (20 mg total) by mouth daily.    Dispense:  90 capsule    Refill:  1    Order Specific Question:  Supervising Provider    Answer:  Ernestina Penna [[id number]]  . atorvastatin (LIPITOR) 40 MG tablet    Sig: Take 1 tablet (40 mg total) by mouth daily.    Dispense:  90 tablet    Refill:  1    Order Specific Question:  Supervising Provider    Answer:  Ernestina Penna [[id number]]  . lisinopril-hydrochlorothiazide (PRINZIDE,ZESTORETIC) 20-12.5 MG per tablet    Sig: Take 0.5 tablets by mouth daily.    Dispense:  45 tablet    Refill:  1    Order Specific Question:  Supervising Provider    Answer:  Deborra Medina    Continue all meds Labs pending Diet and exercise encouraged Health maintenance reviewed Follow up in 3 months  Mary-Margaret Daphine Deutscher, FNP

## 2013-02-08 LAB — CMP14+EGFR
AST: 11 IU/L (ref 0–40)
BUN: 13 mg/dL (ref 8–27)
CO2: 26 mmol/L (ref 18–29)
Calcium: 9.6 mg/dL (ref 8.6–10.2)
Creatinine, Ser: 0.9 mg/dL (ref 0.57–1.00)
Globulin, Total: 2 g/dL (ref 1.5–4.5)
Sodium: 141 mmol/L (ref 134–144)

## 2013-02-08 LAB — NMR, LIPOPROFILE
HDL Cholesterol by NMR: 61 mg/dL (ref >=40)
HDL Particle Number: 42.1 umol/L (ref >=30.5)
LDLC SERPL CALC-MCNC: 62 mg/dL (ref <100)

## 2013-05-20 ENCOUNTER — Ambulatory Visit (INDEPENDENT_AMBULATORY_CARE_PROVIDER_SITE_OTHER): Payer: Medicare Other | Admitting: Nurse Practitioner

## 2013-05-20 ENCOUNTER — Encounter: Payer: Self-pay | Admitting: Nurse Practitioner

## 2013-05-20 VITALS — BP 144/91 | HR 88 | Temp 97.0°F | Ht 60.0 in | Wt 128.0 lb

## 2013-05-20 DIAGNOSIS — I10 Essential (primary) hypertension: Secondary | ICD-10-CM | POA: Diagnosis not present

## 2013-05-20 DIAGNOSIS — J329 Chronic sinusitis, unspecified: Secondary | ICD-10-CM

## 2013-05-20 DIAGNOSIS — E7800 Pure hypercholesterolemia, unspecified: Secondary | ICD-10-CM | POA: Diagnosis not present

## 2013-05-20 DIAGNOSIS — M810 Age-related osteoporosis without current pathological fracture: Secondary | ICD-10-CM | POA: Diagnosis not present

## 2013-05-20 DIAGNOSIS — K219 Gastro-esophageal reflux disease without esophagitis: Secondary | ICD-10-CM | POA: Diagnosis not present

## 2013-05-20 DIAGNOSIS — M25519 Pain in unspecified shoulder: Secondary | ICD-10-CM

## 2013-05-20 DIAGNOSIS — M25512 Pain in left shoulder: Secondary | ICD-10-CM

## 2013-05-20 DIAGNOSIS — H2510 Age-related nuclear cataract, unspecified eye: Secondary | ICD-10-CM | POA: Diagnosis not present

## 2013-05-20 MED ORDER — OMEPRAZOLE 20 MG PO CPDR: 20.0000 mg | DELAYED_RELEASE_CAPSULE | Freq: Every day | ORAL | Status: DC

## 2013-05-20 NOTE — Patient Instructions (Signed)
   Shoulder Pain The shoulder is the joint that connects your arms to your body. The bones that form the shoulder joint include the upper arm bone (humerus), the shoulder blade (scapula), and the collarbone (clavicle). The top of the humerus is shaped like a ball and fits into a rather flat socket on the scapula (glenoid cavity). A combination of muscles and strong, fibrous tissues that connect muscles to bones (tendons) support your shoulder joint and hold the ball in the socket. Small, fluid-filled sacs (bursae) are located in different areas of the joint. They act as cushions between the bones and the overlying soft tissues and help reduce friction between the gliding tendons and the bone as you move your arm. Your shoulder joint allows a wide range of motion in your arm. This range of motion allows you to do things like scratch your back or throw a ball. However, this range of motion also makes your shoulder more prone to pain from overuse and injury. Causes of shoulder pain can originate from both injury and overuse and usually can be grouped in the following four categories:  Redness, swelling, and pain (inflammation) of the tendon (tendinitis) or the bursae (bursitis).  Instability, such as a dislocation of the joint.  Inflammation of the joint (arthritis).  Broken bone (fracture). HOME CARE INSTRUCTIONS   Apply ice to the sore area.  Put ice in a plastic bag.  Place a towel between your skin and the bag.  Leave the ice on for 15-20 minutes, 03-04 times per day for the first 2 days.  Stop using cold packs if they do not help with the pain.  If you have a shoulder sling or immobilizer, wear it as long as your caregiver instructs. Only remove it to shower or bathe. Move your arm as little as possible, but keep your hand moving to prevent swelling.  Squeeze a soft ball or foam pad as much as possible to help prevent swelling.  Only take over-the-counter or prescription medicines for  pain, discomfort, or fever as directed by your caregiver. SEEK MEDICAL CARE IF:   Your shoulder pain increases, or new pain develops in your arm, hand, or fingers.  Your hand or fingers become cold and numb.  Your pain is not relieved with medicines. SEEK IMMEDIATE MEDICAL CARE IF:   Your arm, hand, or fingers are numb or tingling.  Your arm, hand, or fingers are significantly swollen or turn white or blue. MAKE SURE YOU:   Understand these instructions.  Will watch your condition.  Will get help right away if you are not doing well or get worse. Document Released: 02/07/2005 Document Revised: 01/23/2012 Document Reviewed: 04/14/2011 [employer] Patient Information 2014 [employer].  

## 2013-05-20 NOTE — Progress Notes (Signed)
Subjective:    Patient ID: Cheryl Lee, female    DOB: [birth year], 69 y.o.   MRN: [id number]  Patient in today for follow up of chronic medical problems.  Sinus Problem Pertinent negatives include no coughing, neck pain or shortness of breath.  Hypertension This is a chronic problem. The current episode started more than 1 year ago. The problem has been resolved since onset. The problem is controlled. Associated symptoms include peripheral edema. Pertinent negatives include no anxiety, blurred vision, chest pain, malaise/fatigue, neck pain, palpitations or shortness of breath. There are no associated agents to hypertension. Risk factors for coronary artery disease include dyslipidemia and post-menopausal state. Past treatments include ACE inhibitors and diuretics. The current treatment provides significant improvement. There are no compliance problems.  There is no history of a thyroid problem.  Hyperlipidemia This is a chronic problem. The current episode started more than 1 year ago. The problem is controlled. Recent lipid tests were reviewed and are normal. She has no history of diabetes. There are no known factors aggravating her hyperlipidemia. Pertinent negatives include no chest pain, myalgias or shortness of breath. Current antihyperlipidemic treatment includes statins. The current treatment provides significant improvement of lipids. There are no compliance problems.  Risk factors for coronary artery disease include hypertension.  Gastrophageal Reflux She reports no chest pain, no coughing, no heartburn or no nausea. This is a chronic problem. The current episode started more than 1 year ago. The problem occurs rarely. The problem has been resolved. The symptoms are aggravated by certain foods. She has tried a PPI for the symptoms. The treatment provided significant relief.  Shoulder Pain  The pain is present in the left shoulder. This is a recurrent problem. The current episode started  more than 1 month ago. There has been no history of extremity trauma. The problem occurs intermittently. The problem has been unchanged. Quality: soreness with movement. The pain is at a severity of 10/10. The pain is severe. The symptoms are aggravated by activity (trying to hook her bra causes the most pain.). She has tried acetaminophen for the symptoms. The treatment provided mild relief. There is no history of diabetes.  OSTEOPOROSIS Currently on alendronate. Tolerating well. Last DEXA was 07/11/11   Review of Systems  Constitutional: Negative for malaise/fatigue.  Eyes: Negative for blurred vision.  Respiratory: Negative for cough and shortness of breath.   Cardiovascular: Negative for chest pain and palpitations.  Gastrointestinal: Negative for heartburn and nausea.  Musculoskeletal: Negative for myalgias and neck pain.  All other systems reviewed and are negative.       Objective:   Physical Exam  Vitals reviewed. Constitutional: She is oriented to person, place, and time. She appears well-developed and well-nourished.  HENT:  Head: Normocephalic.  Right Ear: External ear normal.  Left Ear: External ear normal.  Nose: Mucosal edema present.  Mouth/Throat: Oropharynx is clear and moist.  Eyes: Pupils are equal, round, and reactive to light.  Neck: Normal range of motion. Neck supple. No JVD present.  Cardiovascular: Normal rate, regular rhythm, normal heart sounds and intact distal pulses.  Exam reveals no gallop and no friction rub.   No murmur heard. No peripheral edema bil  Pulmonary/Chest: Effort normal and breath sounds normal.  Abdominal: Soft. Bowel sounds are normal.  Musculoskeletal: Normal range of motion. She exhibits edema.  Trace amt of swelling bil ankles-broke right ankle 10/04/12  Decrease ROM of left shoulder due to pain on abduction at 45 degrees and internal rotation. grips  equal bil  Neurological: She is alert and oriented to person, place, and time.   Skin: Skin is warm and dry.  Psychiatric: She has a normal mood and affect. Her behavior is normal. Judgment and thought content normal.   BP 144/91  Pulse 88  Temp(Src) 97 F (36.1 C) (Oral)  Ht 5' (1.524 m)  Wt 128 lb (58.06 kg)  BMI 25.00 kg/m2  LMP 08/05/1991        Assessment & Plan:    1. Hypertension   2. Hypercholesteremia   3. Acid reflux   4. Osteoporosis   5. Sinusitis, chronic   6. Left shoulder pain    Orders Placed This Encounter  Procedures  . CMP14+EGFR  . NMR, lipoprofile  . Ambulatory referral to Orthopedic Surgery    Referral Priority:  Routine    Referral Type:  Surgical    Referral Reason:  Specialty Services Required    Requested Specialty:  Orthopedic Surgery    Number of Visits Requested:  1   Meds ordered this encounter  Medications  . atorvastatin (LIPITOR) 10 MG tablet    Sig: Take 10 mg by mouth daily.  Marland Kitchen omeprazole (PRILOSEC) 20 MG capsule    Sig: Take 1 capsule (20 mg total) by mouth daily.    Dispense:  90 capsule    Refill:  1    Order Specific Question:  Supervising Provider    Answer:  Laurance Flatten, DONALD W [[id number]]  nettie pot for chronic sinusitis Referral to ortho for shoulder Continue all meds Labs pending Diet and exercise encouraged Health maintenance reviewed Follow up in 3 months  [place], FNP

## 2013-05-21 LAB — CMP14+EGFR
ALK PHOS: 55 IU/L (ref 39–117)
ALT: 13 IU/L (ref 0–32)
AST: 16 IU/L (ref 0–40)
Albumin/Globulin Ratio: 1.9 (ref 1.1–2.5)
Albumin: 4.4 g/dL (ref 3.6–4.8)
BUN / CREAT RATIO: 22 (ref 11–26)
BUN: 18 mg/dL (ref 8–27)
CALCIUM: 10.3 mg/dL — AB (ref 8.6–10.2)
CHLORIDE: 98 mmol/L (ref 97–108)
CO2: 27 mmol/L (ref 18–29)
Creatinine, Ser: 0.82 mg/dL (ref 0.57–1.00)
GFR calc Af Amer: 86 mL/min/{1.73_m2} (ref >59)
GFR calc non Af Amer: 74 mL/min/{1.73_m2} (ref >59)
Globulin, Total: 2.3 g/dL (ref 1.5–4.5)
Glucose: 98 mg/dL (ref 65–99)
Potassium: 4.4 mmol/L (ref 3.5–5.2)
SODIUM: 140 mmol/L (ref 134–144)
Total Bilirubin: 0.3 mg/dL (ref 0.0–1.2)
Total Protein: 6.7 g/dL (ref 6.0–8.5)

## 2013-05-21 LAB — NMR, LIPOPROFILE
Cholesterol: 169 mg/dL (ref <200)
HDL CHOLESTEROL BY NMR: 66 mg/dL (ref >=40)
HDL Particle Number: 46.8 umol/L (ref >=30.5)
LDL PARTICLE NUMBER: 977 nmol/L (ref <1000)
LDL Size: 20 nm — ABNORMAL LOW (ref >20.5)
LDLC SERPL CALC-MCNC: 70 mg/dL (ref <100)
LP-IR SCORE: 49 — AB (ref <=45)
Small LDL Particle Number: 662 nmol/L — ABNORMAL HIGH (ref <=527)
Triglycerides by NMR: 166 mg/dL — ABNORMAL HIGH (ref <150)

## 2013-06-12 ENCOUNTER — Other Ambulatory Visit: Payer: Medicare Other

## 2013-06-15 DIAGNOSIS — M25519 Pain in unspecified shoulder: Secondary | ICD-10-CM | POA: Diagnosis not present

## 2013-06-15 DIAGNOSIS — Z1212 Encounter for screening for malignant neoplasm of rectum: Secondary | ICD-10-CM | POA: Diagnosis not present

## 2013-06-17 LAB — FECAL OCCULT BLOOD, IMMUNOCHEMICAL: FECAL OCCULT BLD: NEGATIVE

## 2013-06-22 DIAGNOSIS — M25519 Pain in unspecified shoulder: Secondary | ICD-10-CM | POA: Diagnosis not present

## 2013-07-07 DIAGNOSIS — M25519 Pain in unspecified shoulder: Secondary | ICD-10-CM | POA: Diagnosis not present

## 2013-07-07 DIAGNOSIS — M67919 Unspecified disorder of synovium and tendon, unspecified shoulder: Secondary | ICD-10-CM | POA: Diagnosis not present

## 2013-07-07 DIAGNOSIS — M719 Bursopathy, unspecified: Secondary | ICD-10-CM | POA: Diagnosis not present

## 2013-07-27 ENCOUNTER — Ambulatory Visit: Payer: Medicare Other | Attending: Orthopedic Surgery | Admitting: Physical Therapy

## 2013-07-27 DIAGNOSIS — M25519 Pain in unspecified shoulder: Secondary | ICD-10-CM | POA: Diagnosis not present

## 2013-07-27 DIAGNOSIS — R5381 Other malaise: Secondary | ICD-10-CM | POA: Diagnosis not present

## 2013-07-27 DIAGNOSIS — M25619 Stiffness of unspecified shoulder, not elsewhere classified: Secondary | ICD-10-CM | POA: Diagnosis not present

## 2013-07-29 ENCOUNTER — Ambulatory Visit: Payer: Medicare Other | Admitting: Physical Therapy

## 2013-07-31 ENCOUNTER — Ambulatory Visit: Payer: Medicare Other | Admitting: *Deleted

## 2013-08-04 ENCOUNTER — Ambulatory Visit: Payer: Medicare Other | Admitting: Physical Therapy

## 2013-08-06 ENCOUNTER — Ambulatory Visit: Payer: Medicare Other | Admitting: Physical Therapy

## 2013-08-07 ENCOUNTER — Ambulatory Visit: Payer: Medicare Other | Admitting: Physical Therapy

## 2013-08-10 ENCOUNTER — Ambulatory Visit: Payer: Medicare Other | Admitting: Physical Therapy

## 2013-08-12 ENCOUNTER — Ambulatory Visit: Payer: Medicare Other | Attending: Orthopedic Surgery | Admitting: Physical Therapy

## 2013-08-12 DIAGNOSIS — R5381 Other malaise: Secondary | ICD-10-CM | POA: Diagnosis not present

## 2013-08-12 DIAGNOSIS — M25519 Pain in unspecified shoulder: Secondary | ICD-10-CM | POA: Insufficient documentation

## 2013-08-12 DIAGNOSIS — M25619 Stiffness of unspecified shoulder, not elsewhere classified: Secondary | ICD-10-CM | POA: Diagnosis not present

## 2013-08-18 ENCOUNTER — Ambulatory Visit: Payer: Medicare Other | Admitting: Physical Therapy

## 2013-08-19 ENCOUNTER — Ambulatory Visit (INDEPENDENT_AMBULATORY_CARE_PROVIDER_SITE_OTHER): Payer: Medicare Other | Admitting: Nurse Practitioner

## 2013-08-19 ENCOUNTER — Ambulatory Visit (INDEPENDENT_AMBULATORY_CARE_PROVIDER_SITE_OTHER): Payer: Medicare Other

## 2013-08-19 ENCOUNTER — Encounter: Payer: Self-pay | Admitting: Nurse Practitioner

## 2013-08-19 VITALS — BP 131/83 | HR 80 | Temp 97.9°F | Ht 60.0 in | Wt 138.6 lb

## 2013-08-19 DIAGNOSIS — I10 Essential (primary) hypertension: Secondary | ICD-10-CM

## 2013-08-19 DIAGNOSIS — E7800 Pure hypercholesterolemia, unspecified: Secondary | ICD-10-CM | POA: Diagnosis not present

## 2013-08-19 DIAGNOSIS — M810 Age-related osteoporosis without current pathological fracture: Secondary | ICD-10-CM | POA: Diagnosis not present

## 2013-08-19 DIAGNOSIS — K219 Gastro-esophageal reflux disease without esophagitis: Secondary | ICD-10-CM | POA: Diagnosis not present

## 2013-08-19 DIAGNOSIS — Z0000 Encounter for general adult medical examination without abnormal findings: Secondary | ICD-10-CM

## 2013-08-19 MED ORDER — LISINOPRIL-HYDROCHLOROTHIAZIDE 20-12.5 MG PO TABS: 0.5000 | ORAL_TABLET | Freq: Every day | ORAL | Status: DC

## 2013-08-19 MED ORDER — ALENDRONATE SODIUM 70 MG PO TABS: 70.0000 mg | ORAL_TABLET | ORAL | Status: DC

## 2013-08-19 MED ORDER — OMEPRAZOLE 20 MG PO CPDR: 20.0000 mg | DELAYED_RELEASE_CAPSULE | Freq: Every day | ORAL | Status: DC

## 2013-08-19 NOTE — Progress Notes (Signed)
Subjective:    Patient ID: Cheryl Lee, female    DOB: January 05, 1946, 68 y.o.   MRN: [id number]  Patient in today for follow up of chronic medical problems. States she has no new complaints.  Sinus Problem This is a chronic problem. The current episode started more than 1 year ago. The problem has been waxing and waning since onset. There has been no fever. She is experiencing no pain. Pertinent negatives include no congestion, coughing, headaches, neck pain, shortness of breath or sinus pressure. Past treatments include spray decongestants. The treatment provided moderate relief.  Hypertension This is a chronic problem. The current episode started more than 1 year ago. The problem has been resolved since onset. The problem is controlled. Pertinent negatives include no anxiety, blurred vision, chest pain, headaches, malaise/fatigue, neck pain, palpitations, peripheral edema or shortness of breath. There are no associated agents to hypertension. Risk factors for coronary artery disease include dyslipidemia and post-menopausal state. Past treatments include ACE inhibitors and diuretics. The current treatment provides significant improvement. Compliance problems include exercise and diet.  There is no history of a thyroid problem.  Hyperlipidemia This is a chronic problem. The current episode started more than 1 year ago. The problem is controlled. Recent lipid tests were reviewed and are normal. She has no history of diabetes. There are no known factors aggravating her hyperlipidemia. Pertinent negatives include no chest pain, leg pain, myalgias or shortness of breath. Current antihyperlipidemic treatment includes statins. The current treatment provides significant improvement of lipids. Compliance problems include adherence to diet and adherence to exercise.  Risk factors for coronary artery disease include hypertension.  Gastrophageal Reflux She reports no chest pain, no coughing, no heartburn or no  nausea. This is a chronic problem. The current episode started more than 1 year ago. The problem occurs rarely. The problem has been resolved. The symptoms are aggravated by certain foods. She has tried a PPI for the symptoms. The treatment provided significant relief.  Shoulder Pain  The pain is present in the left shoulder. This is a recurrent (Saw orthopedics and was diagnosed with Bursitis) problem. The current episode started more than 1 month ago. There has been no history of extremity trauma. The problem occurs intermittently. The problem has been gradually improving. Quality: soreness and stiffness with movement. The patient is experiencing no pain. Pertinent negatives include no numbness or tingling. The symptoms are aggravated by activity (trying to hook her bra causes the most pain.). She has tried acetaminophen for the symptoms. The treatment provided significant (Currently receiving PT twice a week) relief. There is no history of diabetes.  OSTEOPOROSIS Currently on alendronate once a week - tolerating well. Last DEXA was 07/19/12   Review of Systems  Constitutional: Negative for malaise/fatigue.  HENT: Negative for congestion and sinus pressure.   Eyes: Negative for blurred vision.  Respiratory: Negative for cough and shortness of breath.   Cardiovascular: Negative for chest pain and palpitations.  Gastrointestinal: Negative for heartburn, nausea and constipation.  Musculoskeletal: Negative for myalgias and neck pain.  Neurological: Negative for dizziness, tingling, numbness and headaches.  Psychiatric/Behavioral: Negative for sleep disturbance.  All other systems reviewed and are negative.      Objective:   Physical Exam  Vitals reviewed. Constitutional: She is oriented to person, place, and time. She appears well-developed and well-nourished.  HENT:  Head: Normocephalic.  Right Ear: External ear normal.  Left Ear: External ear normal.  Mouth/Throat: Oropharynx is clear and  moist.  Eyes: Pupils are  equal, round, and reactive to light.  Neck: Normal range of motion. Neck supple. No JVD present.  Cardiovascular: Normal rate, regular rhythm, normal heart sounds and intact distal pulses.  Exam reveals no gallop and no friction rub.   No murmur heard.    Pulmonary/Chest: Effort normal and breath sounds normal.  Abdominal: Soft. Bowel sounds are normal.  Musculoskeletal: Normal range of motion. She exhibits no edema.     Neurological: She is alert and oriented to person, place, and time.  Skin: Skin is warm and dry.  Psychiatric: She has a normal mood and affect. Her behavior is normal. Judgment and thought content normal.   BP 131/83  Pulse 80  Temp(Src) 97.9 F (36.6 C) (Oral)  Ht 5' (1.524 m)  Wt 138 lb 9.6 oz (62.869 kg)  BMI 27.07 kg/m2  LMP 08/05/1991   EKG: NSR Preliminary reading by Ronnald Collum, FNP  [facility] Chest X-Ray: WNL Preliminary reading by Ronnald Collum, FNP  [facility]      Assessment & Plan:   1. Osteoporosis   2. Hypertension   3. Hypercholesteremia   4. Acid reflux    Orders Placed This Encounter  Procedures  . CMP14+EGFR  . NMR, lipoprofile  . EKG 12-Lead    Meds ordered this encounter  Medications  . omeprazole (PRILOSEC) 20 MG capsule    Sig: Take 1 capsule (20 mg total) by mouth daily.    Dispense:  90 capsule    Refill:  1    Order Specific Question:  Supervising Provider    Answer:  Chipper Herb [[id number]]  . lisinopril-hydrochlorothiazide (PRINZIDE,ZESTORETIC) 20-12.5 MG per tablet    Sig: Take 0.5 tablets by mouth daily.    Dispense:  45 tablet    Refill:  1    Order Specific Question:  Supervising Provider    Answer:  Chipper Herb [[id number]]  . alendronate (FOSAMAX) 70 MG tablet    Sig: Take 1 tablet (70 mg total) by mouth every 7 (seven) days. Take with a full glass of water on an empty stomach.    Dispense:  12 tablet    Refill:  1    Order Specific Question:  Supervising Provider    Answer:  Joycelyn Man   Continue PT for shoulder pain Labs pending Health maintenance reviewed Diet and exercise encouraged Continue all meds Follow up  In 3 months   [place], FNP

## 2013-08-19 NOTE — Patient Instructions (Signed)
Fat and Cholesterol Control Diet  Fat and cholesterol levels in your blood and organs are influenced by your diet. High levels of fat and cholesterol may lead to diseases of the heart, small and large blood vessels, gallbladder, liver, and pancreas.  CONTROLLING FAT AND CHOLESTEROL WITH DIET  Although exercise and lifestyle factors are important, your diet is key. That is because certain foods are known to raise cholesterol and others to lower it. The goal is to balance foods for their effect on cholesterol and more importantly, to replace saturated and trans fat with other types of fat, such as monounsaturated fat, polyunsaturated fat, and omega-3 fatty acids.  On average, a person should consume no more than 15 to 17 g of saturated fat daily. Saturated and trans fats are considered "bad" fats, and they will raise LDL cholesterol. Saturated fats are primarily found in animal products such as meats, butter, and cream. However, that does not mean you need to give up all your favorite foods. Today, there are good tasting, low-fat, low-cholesterol substitutes for most of the things you like to eat. Choose low-fat or nonfat alternatives. Choose round or loin cuts of red meat. These types of cuts are lowest in fat and cholesterol. Chicken (without the skin), fish, veal, and ground turkey breast are great choices. Eliminate fatty meats, such as hot dogs and salami. Even shellfish have little or no saturated fat. Have a 3 oz (85 g) portion when you eat lean meat, poultry, or fish.  Trans fats are also called "partially hydrogenated oils." They are oils that have been scientifically manipulated so that they are solid at room temperature resulting in a longer shelf life and improved taste and texture of foods in which they are added. Trans fats are found in stick margarine, some tub margarines, cookies, crackers, and baked goods.   When baking and cooking, oils are a great substitute for butter. The monounsaturated oils are  especially beneficial since it is believed they lower LDL and raise HDL. The oils you should avoid entirely are saturated tropical oils, such as coconut and palm.   Remember to eat a lot from food groups that are naturally free of saturated and trans fat, including fish, fruit, vegetables, beans, grains (barley, rice, couscous, bulgur wheat), and pasta (without cream sauces).   IDENTIFYING FOODS THAT LOWER FAT AND CHOLESTEROL   Soluble fiber may lower your cholesterol. This type of fiber is found in fruits such as apples, vegetables such as broccoli, potatoes, and carrots, legumes such as beans, peas, and lentils, and grains such as barley. Foods fortified with plant sterols (phytosterol) may also lower cholesterol. You should eat at least 2 g per day of these foods for a cholesterol lowering effect.   Read package labels to identify low-saturated fats, trans fat free, and low-fat foods at the supermarket. Select cheeses that have only 2 to 3 g saturated fat per ounce. Use a heart-healthy tub margarine that is free of trans fats or partially hydrogenated oil. When buying baked goods (cookies, crackers), avoid partially hydrogenated oils. Breads and muffins should be made from whole grains (whole-wheat or whole oat flour, instead of "flour" or "enriched flour"). Buy non-creamy canned soups with reduced salt and no added fats.   FOOD PREPARATION TECHNIQUES   Never deep-fry. If you must fry, either stir-fry, which uses very little fat, or use non-stick cooking sprays. When possible, broil, bake, or roast meats, and steam vegetables. Instead of putting butter or margarine on vegetables, use lemon   and herbs, applesauce, and cinnamon (for squash and sweet potatoes). Use nonfat yogurt, salsa, and low-fat dressings for salads.   LOW-SATURATED FAT / LOW-FAT FOOD SUBSTITUTES  Meats / Saturated Fat (g)  · Avoid: Steak, marbled (3 oz/85 g) / 11 g  · Choose: Steak, lean (3 oz/85 g) / 4 g  · Avoid: Hamburger (3 oz/85 g) / 7  g  · Choose: Hamburger, lean (3 oz/85 g) / 5 g  · Avoid: Ham (3 oz/85 g) / 6 g  · Choose: Ham, lean cut (3 oz/85 g) / 2.4 g  · Avoid: Chicken, with skin, dark meat (3 oz/85 g) / 4 g  · Choose: Chicken, skin removed, dark meat (3 oz/85 g) / 2 g  · Avoid: Chicken, with skin, light meat (3 oz/85 g) / 2.5 g  · Choose: Chicken, skin removed, light meat (3 oz/85 g) / 1 g  Dairy / Saturated Fat (g)  · Avoid: Whole milk (1 cup) / 5 g  · Choose: Low-fat milk, 2% (1 cup) / 3 g  · Choose: Low-fat milk, 1% (1 cup) / 1.5 g  · Choose: Skim milk (1 cup) / 0.3 g  · Avoid: Hard cheese (1 oz/28 g) / 6 g  · Choose: Skim milk cheese (1 oz/28 g) / 2 to 3 g  · Avoid: Cottage cheese, 4% fat (1 cup) / 6.5 g  · Choose: Low-fat cottage cheese, 1% fat (1 cup) / 1.5 g  · Avoid: Ice cream (1 cup) / 9 g  · Choose: Sherbet (1 cup) / 2.5 g  · Choose: Nonfat frozen yogurt (1 cup) / 0.3 g  · Choose: Frozen fruit bar / trace  · Avoid: Whipped cream (1 tbs) / 3.5 g  · Choose: Nondairy whipped topping (1 tbs) / 1 g  Condiments / Saturated Fat (g)  · Avoid: Mayonnaise (1 tbs) / 2 g  · Choose: Low-fat mayonnaise (1 tbs) / 1 g  · Avoid: Butter (1 tbs) / 7 g  · Choose: Extra light margarine (1 tbs) / 1 g  · Avoid: Coconut oil (1 tbs) / 11.8 g  · Choose: Olive oil (1 tbs) / 1.8 g  · Choose: Corn oil (1 tbs) / 1.7 g  · Choose: Safflower oil (1 tbs) / 1.2 g  · Choose: Sunflower oil (1 tbs) / 1.4 g  · Choose: Soybean oil (1 tbs) / 2.4 g  · Choose: Canola oil (1 tbs) / 1 g  Document Released: 04/30/2005 Document Revised: 08/25/2012 Document Reviewed: 10/19/2010  ExitCare® Patient Information ©2014 [employer].

## 2013-08-20 ENCOUNTER — Ambulatory Visit: Payer: Medicare Other | Admitting: Physical Therapy

## 2013-08-20 LAB — CMP14+EGFR
A/G RATIO: 2.2 (ref 1.1–2.5)
ALT: 19 IU/L (ref 0–32)
AST: 17 IU/L (ref 0–40)
Albumin: 4.7 g/dL (ref 3.6–4.8)
Alkaline Phosphatase: 55 IU/L (ref 39–117)
BUN/Creatinine Ratio: 26 (ref 11–26)
BUN: 20 mg/dL (ref 8–27)
CHLORIDE: 100 mmol/L (ref 97–108)
CO2: 24 mmol/L (ref 18–29)
Calcium: 10 mg/dL (ref 8.7–10.3)
Creatinine, Ser: 0.78 mg/dL (ref 0.57–1.00)
GFR calc Af Amer: 91 mL/min/{1.73_m2} (ref >59)
GFR, EST NON AFRICAN AMERICAN: 79 mL/min/{1.73_m2} (ref >59)
GLUCOSE: 93 mg/dL (ref 65–99)
Globulin, Total: 2.1 g/dL (ref 1.5–4.5)
POTASSIUM: 4.6 mmol/L (ref 3.5–5.2)
SODIUM: 143 mmol/L (ref 134–144)
TOTAL PROTEIN: 6.8 g/dL (ref 6.0–8.5)
Total Bilirubin: 0.4 mg/dL (ref 0.0–1.2)

## 2013-08-20 LAB — NMR, LIPOPROFILE
Cholesterol: 181 mg/dL (ref <200)
HDL CHOLESTEROL BY NMR: 68 mg/dL (ref >=40)
HDL Particle Number: 50.6 umol/L (ref >=30.5)
LDL PARTICLE NUMBER: 1227 nmol/L — AB (ref <1000)
LDL Size: 20.6 nm (ref >20.5)
LDLC SERPL CALC-MCNC: 81 mg/dL (ref <100)
LP-IR Score: 45 (ref <=45)
SMALL LDL PARTICLE NUMBER: 509 nmol/L (ref <=527)
TRIGLYCERIDES BY NMR: 158 mg/dL — AB (ref <150)

## 2013-08-25 ENCOUNTER — Ambulatory Visit: Payer: Medicare Other | Admitting: Physical Therapy

## 2013-08-27 ENCOUNTER — Ambulatory Visit: Payer: Medicare Other | Admitting: Physical Therapy

## 2013-08-31 ENCOUNTER — Ambulatory Visit: Payer: Medicare Other | Admitting: Physical Therapy

## 2013-09-04 ENCOUNTER — Ambulatory Visit: Payer: Medicare Other | Admitting: Physical Therapy

## 2013-09-07 ENCOUNTER — Ambulatory Visit: Payer: Medicare Other | Admitting: Physical Therapy

## 2013-09-09 ENCOUNTER — Ambulatory Visit: Payer: Medicare Other | Admitting: Physical Therapy

## 2013-09-15 ENCOUNTER — Ambulatory Visit: Payer: Medicare Other | Attending: Orthopedic Surgery | Admitting: Physical Therapy

## 2013-09-15 DIAGNOSIS — M25619 Stiffness of unspecified shoulder, not elsewhere classified: Secondary | ICD-10-CM | POA: Diagnosis not present

## 2013-09-15 DIAGNOSIS — M25519 Pain in unspecified shoulder: Secondary | ICD-10-CM | POA: Diagnosis not present

## 2013-09-15 DIAGNOSIS — R5381 Other malaise: Secondary | ICD-10-CM | POA: Diagnosis not present

## 2013-09-17 ENCOUNTER — Ambulatory Visit: Payer: Medicare Other | Admitting: Physical Therapy

## 2013-10-27 DIAGNOSIS — Z1231 Encounter for screening mammogram for malignant neoplasm of breast: Secondary | ICD-10-CM | POA: Diagnosis not present

## 2013-11-20 ENCOUNTER — Ambulatory Visit (INDEPENDENT_AMBULATORY_CARE_PROVIDER_SITE_OTHER): Payer: Medicare Other | Admitting: Nurse Practitioner

## 2013-11-20 ENCOUNTER — Encounter: Payer: Self-pay | Admitting: Nurse Practitioner

## 2013-11-20 VITALS — BP 132/76 | HR 92 | Temp 98.8°F | Ht 60.0 in | Wt 141.0 lb

## 2013-11-20 DIAGNOSIS — E7800 Pure hypercholesterolemia, unspecified: Secondary | ICD-10-CM | POA: Diagnosis not present

## 2013-11-20 DIAGNOSIS — I10 Essential (primary) hypertension: Secondary | ICD-10-CM

## 2013-11-20 DIAGNOSIS — K219 Gastro-esophageal reflux disease without esophagitis: Secondary | ICD-10-CM | POA: Diagnosis not present

## 2013-11-20 DIAGNOSIS — M810 Age-related osteoporosis without current pathological fracture: Secondary | ICD-10-CM | POA: Diagnosis not present

## 2013-11-20 MED ORDER — ALENDRONATE SODIUM 70 MG PO TABS: 70.0000 mg | ORAL_TABLET | ORAL | Status: DC

## 2013-11-20 MED ORDER — OMEPRAZOLE 20 MG PO CPDR: 20.0000 mg | DELAYED_RELEASE_CAPSULE | Freq: Every day | ORAL | Status: DC

## 2013-11-20 MED ORDER — LISINOPRIL-HYDROCHLOROTHIAZIDE 20-12.5 MG PO TABS: 0.5000 | ORAL_TABLET | Freq: Every day | ORAL | Status: DC

## 2013-11-20 NOTE — Patient Instructions (Signed)
Osteoporosis Throughout your life, your body breaks down old bone and replaces it with new bone. As you get older, your body does not replace bone as quickly as it breaks it down. By the age of 30 years, most people begin to gradually lose bone because of the imbalance between bone loss and replacement. Some people lose more bone than others. Bone loss beyond a specified normal degree is considered osteoporosis.  Osteoporosis affects the strength and durability of your bones. The inside of the ends of your bones and your flat bones, like the bones of your pelvis, look like honeycomb, filled with tiny open spaces. As bone loss occurs, your bones become less dense. This means that the open spaces inside your bones become bigger and the walls between these spaces become thinner. This makes your bones weaker. Bones of a person with osteoporosis can become so weak that they can break (fracture) during minor accidents, such as a simple fall. CAUSES  The following factors have been associated with the development of osteoporosis:  Smoking.  Drinking more than 2 alcoholic drinks several days per week.  Long-term use of certain medicines:  Corticosteroids.  Chemotherapy medicines.  Thyroid medicines.  Antiepileptic medicines.  Gonadal hormone suppression medicine.  Immunosuppression medicine.  Being underweight.  Lack of physical activity.  Lack of exposure to the sun. This can lead to vitamin D deficiency.  Certain medical conditions:  Certain inflammatory bowel diseases, such as Crohn disease and ulcerative colitis.  Diabetes.  Hyperthyroidism.  Hyperparathyroidism. RISK FACTORS Anyone can develop osteoporosis. However, the following factors can increase your risk of developing osteoporosis:  Gender--Women are at higher risk than men.  Age--Being older than 50 years increases your risk.  Ethnicity--White and Asian people have an increased risk.  Weight --Being extremely  underweight can increase your risk of osteoporosis.  Family history of osteoporosis--Having a family member who has developed osteoporosis can increase your risk. SYMPTOMS  Usually, people with osteoporosis have no symptoms.  DIAGNOSIS  Signs during a physical exam that may prompt your caregiver to suspect osteoporosis include:  Decreased height. This is usually caused by the compression of the bones that form your spine (vertebrae) because they have weakened and become fractured.  A curving or rounding of the upper back (kyphosis). To confirm signs of osteoporosis, your caregiver may request a procedure that uses 2 low-dose X-ray beams with different levels of energy to measure your bone mineral density (dual-energy X-ray absorptiometry [DXA]). Also, your caregiver may check your level of vitamin D. TREATMENT  The goal of osteoporosis treatment is to strengthen bones in order to decrease the risk of bone fractures. There are different types of medicines available to help achieve this goal. Some of these medicines work by slowing the processes of bone loss. Some medicines work by increasing bone density. Treatment also involves making sure that your levels of calcium and vitamin D are adequate. PREVENTION  There are things you can do to help prevent osteoporosis. Adequate intake of calcium and vitamin D can help you achieve optimal bone mineral density. Regular exercise can also help, especially resistance and weight-bearing activities. If you smoke, quitting smoking is an important part of osteoporosis prevention. MAKE SURE YOU:  Understand these instructions.  Will watch your condition.  Will get help right away if you are not doing well or get worse. FOR MORE INFORMATION www.osteo.org and www.nof.org Document Released: 02/07/2005 Document Revised: 08/25/2012 Document Reviewed: 04/14/2011 ExitCare Patient Information 2015 [employer]. This information is not   intended to replace advice  given to you by your health care provider. Make sure you discuss any questions you have with your health care provider.  

## 2013-11-20 NOTE — Progress Notes (Signed)
Subjective:    Patient ID: Cheryl Lee, female    DOB: 11-10-1945, 68 y.o.   MRN: [id number]  Patient in today for follow up of chronic medical problems.  Sinus Problem Pertinent negatives include no coughing, neck pain or shortness of breath.  Hypertension This is a chronic problem. The current episode started more than 1 year ago. The problem has been resolved since onset. The problem is controlled. Associated symptoms include peripheral edema. Pertinent negatives include no anxiety, blurred vision, chest pain, malaise/fatigue, neck pain, palpitations or shortness of breath. There are no associated agents to hypertension. Risk factors for coronary artery disease include dyslipidemia and post-menopausal state. Past treatments include ACE inhibitors and diuretics. The current treatment provides significant improvement. There are no compliance problems.  There is no history of a thyroid problem.  Hyperlipidemia This is a chronic problem. The current episode started more than 1 year ago. The problem is controlled. Recent lipid tests were reviewed and are normal. There are no known factors aggravating her hyperlipidemia. Pertinent negatives include no chest pain, myalgias or shortness of breath. Current antihyperlipidemic treatment includes statins. The current treatment provides significant improvement of lipids. There are no compliance problems.  Risk factors for coronary artery disease include hypertension.  Gastrophageal Reflux She reports no chest pain, no coughing, no heartburn or no nausea. This is a chronic problem. The current episode started more than 1 year ago. The problem occurs rarely. The problem has been resolved. The symptoms are aggravated by certain foods. She has tried a PPI for the symptoms. The treatment provided significant relief.  OSTEOPOROSIS Currently on alendronate. Tolerating well. Last DEXA was 07/11/11   Review of Systems  Constitutional: Negative for  malaise/fatigue.  Eyes: Negative for blurred vision.  Respiratory: Negative for cough and shortness of breath.   Cardiovascular: Negative for chest pain and palpitations.  Gastrointestinal: Negative for heartburn and nausea.  Musculoskeletal: Negative for myalgias and neck pain.  All other systems reviewed and are negative.      Objective:   Physical Exam  Vitals reviewed. Constitutional: She is oriented to person, place, and time. She appears well-developed and well-nourished.  HENT:  Head: Normocephalic.  Right Ear: External ear normal.  Left Ear: External ear normal.  Nose: Mucosal edema present.  Mouth/Throat: Oropharynx is clear and moist.  Eyes: Pupils are equal, round, and reactive to light.  Neck: Normal range of motion. Neck supple. No JVD present.  Cardiovascular: Normal rate, regular rhythm, normal heart sounds and intact distal pulses.  Exam reveals no gallop and no friction rub.   No murmur heard. No peripheral edema bil  Pulmonary/Chest: Effort normal and breath sounds normal.  Abdominal: Soft. Bowel sounds are normal.  Musculoskeletal: Normal range of motion. She exhibits edema.  Trace amt of swelling bil ankles-broke right ankle 10/04/12  Decrease ROM of left shoulder due to pain on abduction at 45 degrees and internal rotation. grips equal bil  Neurological: She is alert and oriented to person, place, and time.  Skin: Skin is warm and dry.  Psychiatric: She has a normal mood and affect. Her behavior is normal. Judgment and thought content normal.   BP 132/76  Pulse 92  Temp(Src) 98.8 F (37.1 C) (Oral)  Ht 5' (1.524 m)  Wt 141 lb (63.957 kg)  BMI 27.54 kg/m2  LMP 08/05/1991        Assessment & Plan:   1. Osteoporosis   2. Essential hypertension   3. Hypercholesteremia   4. Gastroesophageal reflux  disease without esophagitis    Orders Placed This Encounter  Procedures  . CMP14+EGFR  . NMR, lipoprofile   Meds ordered this encounter   Medications  . fluticasone (FLONASE) 50 MCG/ACT nasal spray    Sig: Place into both nostrils daily.  Marland Kitchen omeprazole (PRILOSEC) 20 MG capsule    Sig: Take 1 capsule (20 mg total) by mouth daily.    Dispense:  90 capsule    Refill:  1    Order Specific Question:  Supervising Provider    Answer:  Chipper Herb [[id number]]  . lisinopril-hydrochlorothiazide (PRINZIDE,ZESTORETIC) 20-12.5 MG per tablet    Sig: Take 0.5 tablets by mouth daily.    Dispense:  45 tablet    Refill:  1    Order Specific Question:  Supervising Provider    Answer:  Chipper Herb [[id number]]  . alendronate (FOSAMAX) 70 MG tablet    Sig: Take 1 tablet (70 mg total) by mouth every 7 (seven) days. Take with a full glass of water on an empty stomach.    Dispense:  12 tablet    Refill:  1    Order Specific Question:  Supervising Provider    Answer:  Chipper Herb [[id number]]    Labs pending Health maintenance reviewed Diet and exercise encouraged Continue all meds Follow up  In 3 months    [place], FNP

## 2013-11-21 LAB — CMP14+EGFR
ALT: 11 IU/L (ref 0–32)
AST: 16 IU/L (ref 0–40)
Albumin/Globulin Ratio: 2.3 (ref 1.1–2.5)
Albumin: 4.5 g/dL (ref 3.6–4.8)
Alkaline Phosphatase: 75 IU/L (ref 39–117)
BUN/Creatinine Ratio: 17 (ref 11–26)
BUN: 16 mg/dL (ref 8–27)
CO2: 25 mmol/L (ref 18–29)
Calcium: 10.1 mg/dL (ref 8.7–10.3)
Chloride: 100 mmol/L (ref 97–108)
Creatinine, Ser: 0.96 mg/dL (ref 0.57–1.00)
GFR calc Af Amer: 71 mL/min/{1.73_m2} (ref >59)
GFR calc non Af Amer: 61 mL/min/{1.73_m2} (ref >59)
Globulin, Total: 2 g/dL (ref 1.5–4.5)
Glucose: 97 mg/dL (ref 65–99)
Potassium: 5.1 mmol/L (ref 3.5–5.2)
Sodium: 139 mmol/L (ref 134–144)
Total Bilirubin: 0.5 mg/dL (ref 0.0–1.2)
Total Protein: 6.5 g/dL (ref 6.0–8.5)

## 2013-11-21 LAB — NMR, LIPOPROFILE
CHOLESTEROL: 168 mg/dL (ref 100–199)
HDL Cholesterol by NMR: 56 mg/dL (ref >39)
HDL Particle Number: 41.4 umol/L (ref >=30.5)
LDL Particle Number: 1113 nmol/L — ABNORMAL HIGH (ref <1000)
LDL SIZE: 20.8 nm (ref >20.5)
LDLC SERPL CALC-MCNC: 80 mg/dL (ref 0–99)
LP-IR SCORE: 69 — AB (ref <=45)
Small LDL Particle Number: 507 nmol/L (ref <=527)
Triglycerides by NMR: 158 mg/dL — ABNORMAL HIGH (ref 0–149)

## 2014-03-03 ENCOUNTER — Encounter: Payer: Self-pay | Admitting: Nurse Practitioner

## 2014-03-03 ENCOUNTER — Ambulatory Visit (INDEPENDENT_AMBULATORY_CARE_PROVIDER_SITE_OTHER): Payer: Medicare Other | Admitting: Nurse Practitioner

## 2014-03-03 VITALS — BP 136/84 | HR 88 | Temp 97.1°F | Ht 60.0 in | Wt 147.6 lb

## 2014-03-03 DIAGNOSIS — I10 Essential (primary) hypertension: Secondary | ICD-10-CM

## 2014-03-03 DIAGNOSIS — K219 Gastro-esophageal reflux disease without esophagitis: Secondary | ICD-10-CM

## 2014-03-03 DIAGNOSIS — E7800 Pure hypercholesterolemia, unspecified: Secondary | ICD-10-CM

## 2014-03-03 DIAGNOSIS — M810 Age-related osteoporosis without current pathological fracture: Secondary | ICD-10-CM | POA: Diagnosis not present

## 2014-03-03 MED ORDER — OMEPRAZOLE 20 MG PO CPDR: 20.0000 mg | DELAYED_RELEASE_CAPSULE | Freq: Every day | ORAL | Status: DC

## 2014-03-03 MED ORDER — ALENDRONATE SODIUM 70 MG PO TABS
70.0000 mg | ORAL_TABLET | ORAL | Status: DC
Start: 2014-03-03 — End: 2014-12-21

## 2014-03-03 MED ORDER — LISINOPRIL-HYDROCHLOROTHIAZIDE 20-12.5 MG PO TABS: 0.5000 | ORAL_TABLET | Freq: Every day | ORAL | Status: DC

## 2014-03-03 MED ORDER — ATORVASTATIN CALCIUM 10 MG PO TABS: 10.0000 mg | ORAL_TABLET | Freq: Every day | ORAL | Status: DC

## 2014-03-03 NOTE — Patient Instructions (Addendum)

## 2014-03-03 NOTE — Progress Notes (Signed)
Subjective:    Patient ID: Cheryl Lee, female    DOB: [birth year], 68 y.o.   MRN: [id number]  Patient in today for follow up of chronic medical problems. No acute complaints.   Hypertension This is a chronic problem. The current episode started more than 1 year ago. The problem has been resolved since onset. The problem is controlled. Associated symptoms include peripheral edema. Pertinent negatives include no anxiety, blurred vision, chest pain, malaise/fatigue or palpitations. There are no associated agents to hypertension. Risk factors for coronary artery disease include dyslipidemia and post-menopausal state. Past treatments include ACE inhibitors and diuretics. The current treatment provides significant improvement. There are no compliance problems.  There is no history of a thyroid problem.  Hyperlipidemia This is a chronic problem. The current episode started more than 1 year ago. The problem is controlled. Recent lipid tests were reviewed and are normal. There are no known factors aggravating her hyperlipidemia. Pertinent negatives include no chest pain or myalgias. Current antihyperlipidemic treatment includes statins. The current treatment provides significant improvement of lipids. There are no compliance problems.  Risk factors for coronary artery disease include hypertension.  Gastrophageal Reflux She reports no chest pain, no heartburn or no nausea. This is a chronic problem. The current episode started more than 1 year ago. The problem occurs rarely. The problem has been resolved. The symptoms are aggravated by certain foods. She has tried a PPI for the symptoms. The treatment provided significant relief.  OSTEOPOROSIS Currently on alendronate. Tolerating well. Last DEXA was 07/11/11  *Patient reports her shoulder pain is better, and she takes flonase to help with sinus problem.   Review of Systems  Constitutional: Negative for malaise/fatigue.  Eyes: Negative for blurred vision.   Cardiovascular: Negative for chest pain and palpitations.  Gastrointestinal: Negative for heartburn and nausea.  Musculoskeletal: Negative for myalgias.  All other systems reviewed and are negative.      Objective:   Physical Exam  Vitals reviewed. Constitutional: She is oriented to person, place, and time. She appears well-developed and well-nourished.  HENT:  Head: Normocephalic.  Right Ear: External ear normal.  Left Ear: External ear normal.  Mouth/Throat: Oropharynx is clear and moist.  Eyes: Pupils are equal, round, and reactive to light.  Neck: Normal range of motion. Neck supple. No JVD present.  Cardiovascular: Normal rate, regular rhythm, normal heart sounds and intact distal pulses.  Exam reveals no gallop and no friction rub.   No murmur heard. No peripheral edema bil  Pulmonary/Chest: Effort normal and breath sounds normal.  Abdominal: Soft. Bowel sounds are normal.  Musculoskeletal: Normal range of motion. She exhibits edema.  Trace amt of swelling bil ankles-broke right ankle 10/04/12    Neurological: She is alert and oriented to person, place, and time.  Skin: Skin is warm and dry.  Psychiatric: She has a normal mood and affect. Her behavior is normal. Judgment and thought content normal.   BP 136/84  Pulse 88  Temp(Src) 97.1 F (36.2 C) (Oral)  Ht 5' (1.524 m)  Wt 147 lb 9.6 oz (66.951 kg)  BMI 28.83 kg/m2  LMP 08/05/1991        Assessment & Plan:   1. Osteoporosis Weight bearing exerises - alendronate (FOSAMAX) 70 MG tablet; Take 1 tablet (70 mg total) by mouth every 7 (seven) days. Take with a full glass of water on an empty stomach.  Dispense: 12 tablet; Refill: 1  2. Essential hypertension Low NA+ diet -Low salt diet  - CMP14+EGFR -  lisinopril-hydrochlorothiazide (PRINZIDE,ZESTORETIC) 20-12.5 MG per tablet; Take 0.5 tablets by mouth daily.  Dispense: 45 tablet; Refill: 1  3. Gastroesophageal reflux disease without esophagitis Avoid spicy  and fatty foods - omeprazole (PRILOSEC) 20 MG capsule; Take 1 capsule (20 mg total) by mouth daily.  Dispense: 90 capsule; Refill: 1  4. Hypercholesteremia Watch fat in diet - NMR, lipoprofile - atorvastatin (LIPITOR) 10 MG tablet; Take 1 tablet (10 mg total) by mouth daily.  Dispense: 90 tablet; Refill: 1   Labs pending Health maintenance reviewed Diet and exercise encouraged Continue all meds Follow up  In 3 months   [place], FNP

## 2014-03-04 LAB — NMR, LIPOPROFILE
Cholesterol: 179 mg/dL (ref 100–199)
HDL Cholesterol by NMR: 54 mg/dL (ref >39)
HDL PARTICLE NUMBER: 37.2 umol/L (ref >=30.5)
LDL PARTICLE NUMBER: 1063 nmol/L — AB (ref <1000)
LDL Size: 20.6 nm (ref >20.5)
LDLC SERPL CALC-MCNC: 82 mg/dL (ref 0–99)
LP-IR Score: 74 — ABNORMAL HIGH (ref <=45)
Small LDL Particle Number: 504 nmol/L (ref <=527)
Triglycerides by NMR: 216 mg/dL — ABNORMAL HIGH (ref 0–149)

## 2014-03-04 LAB — CMP14+EGFR
ALT: 19 IU/L (ref 0–32)
AST: 16 IU/L (ref 0–40)
Albumin/Globulin Ratio: 2.1 (ref 1.1–2.5)
Albumin: 4.4 g/dL (ref 3.6–4.8)
Alkaline Phosphatase: 73 IU/L (ref 39–117)
BUN/Creatinine Ratio: 16 (ref 11–26)
BUN: 15 mg/dL (ref 8–27)
CALCIUM: 10 mg/dL (ref 8.7–10.3)
CHLORIDE: 100 mmol/L (ref 97–108)
CO2: 25 mmol/L (ref 18–29)
Creatinine, Ser: 0.95 mg/dL (ref 0.57–1.00)
GFR calc Af Amer: 72 mL/min/{1.73_m2} (ref >59)
GFR calc non Af Amer: 62 mL/min/{1.73_m2} (ref >59)
GLOBULIN, TOTAL: 2.1 g/dL (ref 1.5–4.5)
Glucose: 92 mg/dL (ref 65–99)
Potassium: 4.6 mmol/L (ref 3.5–5.2)
SODIUM: 141 mmol/L (ref 134–144)
Total Bilirubin: 0.4 mg/dL (ref 0.0–1.2)
Total Protein: 6.5 g/dL (ref 6.0–8.5)

## 2014-06-03 ENCOUNTER — Encounter: Payer: Self-pay | Admitting: Nurse Practitioner

## 2014-06-03 ENCOUNTER — Ambulatory Visit (INDEPENDENT_AMBULATORY_CARE_PROVIDER_SITE_OTHER): Payer: Medicare Other | Admitting: Nurse Practitioner

## 2014-06-03 VITALS — BP 134/84 | HR 100 | Temp 97.2°F | Ht 60.0 in | Wt 141.0 lb

## 2014-06-03 DIAGNOSIS — E785 Hyperlipidemia, unspecified: Secondary | ICD-10-CM | POA: Diagnosis not present

## 2014-06-03 DIAGNOSIS — K219 Gastro-esophageal reflux disease without esophagitis: Secondary | ICD-10-CM

## 2014-06-03 DIAGNOSIS — I10 Essential (primary) hypertension: Secondary | ICD-10-CM

## 2014-06-03 DIAGNOSIS — M810 Age-related osteoporosis without current pathological fracture: Secondary | ICD-10-CM | POA: Diagnosis not present

## 2014-06-03 NOTE — Patient Instructions (Signed)

## 2014-06-03 NOTE — Progress Notes (Signed)
  Subjective:    Patient ID: Cheryl Lee, female    DOB: 1945-12-04, 69 y.o.   MRN: [id number]  Patient in today for follow up of chronic medical problems. No acute complaints.   Hypertension This is a chronic problem. The current episode started more than 1 year ago. The problem is controlled. Pertinent negatives include no palpitations. Risk factors for coronary artery disease include dyslipidemia and post-menopausal state. Past treatments include ACE inhibitors and diuretics. The current treatment provides moderate improvement. Compliance problems include diet and exercise.   Hyperlipidemia This is a chronic problem. The current episode started more than 1 year ago. The problem is controlled. Recent lipid tests were reviewed and are variable. She has no history of diabetes, hypothyroidism or obesity. Pertinent negatives include no myalgias. Current antihyperlipidemic treatment includes statins. The current treatment provides moderate improvement of lipids. Compliance problems include adherence to diet and adherence to exercise.  Risk factors for coronary artery disease include dyslipidemia, hypertension and post-menopausal.  OSTEOPOROSIS Currently on alendronate. Tolerating well. Last DEXA was 07/11/11 GERD Omeprazole works well to keep symptoms under control  Review of Systems  Constitutional: Negative.   HENT: Negative.   Respiratory: Negative.   Cardiovascular: Negative for palpitations.  Gastrointestinal: Negative.   Genitourinary: Negative.   Musculoskeletal: Negative for myalgias.  Neurological: Negative.   Psychiatric/Behavioral: Negative.   All other systems reviewed and are negative.      Objective:   Physical Exam  Constitutional: She is oriented to person, place, and time. She appears well-developed and well-nourished.  HENT:  Head: Normocephalic.  Right Ear: External ear normal.  Left Ear: External ear normal.  Mouth/Throat: Oropharynx is clear and moist.  Eyes:  Pupils are equal, round, and reactive to light.  Neck: Normal range of motion. Neck supple. No JVD present.  Cardiovascular: Normal rate, regular rhythm, normal heart sounds and intact distal pulses.  Exam reveals no gallop and no friction rub.   No murmur heard. No peripheral edema bil  Pulmonary/Chest: Effort normal and breath sounds normal.  Abdominal: Soft. Bowel sounds are normal.  Musculoskeletal: Normal range of motion. She exhibits edema.  Trace amt of swelling bil ankles-broke right ankle 10/04/12    Neurological: She is alert and oriented to person, place, and time.  Skin: Skin is warm and dry.  Psychiatric: She has a normal mood and affect. Her behavior is normal. Judgment and thought content normal.  Vitals reviewed.  BP 134/84 mmHg  Pulse 100  Temp(Src) 97.2 F (36.2 C) (Oral)  Ht 5' (1.524 m)  Wt 141 lb (63.957 kg)  BMI 27.54 kg/m2  LMP 08/05/1991        Assessment & Plan:   1. Essential hypertension Do not add salt to diet - CMP14+EGFR  2. Osteoporosis Weight bearing exercises  3. Gastroesophageal reflux disease without esophagitis Avoid spicy foods Do not eat 2 hours prior to bedtime  4. Hyperlipidemia with target LDL less than 100 Low fat diet - NMR, lipoprofile   hemoccult cards given to patient with directions Labs pending Health maintenance reviewed Diet and exercise encouraged Continue all meds Follow up  In 3 months   [place], FNP

## 2014-07-21 ENCOUNTER — Other Ambulatory Visit: Payer: Medicare Other

## 2014-07-21 DIAGNOSIS — Z1212 Encounter for screening for malignant neoplasm of rectum: Secondary | ICD-10-CM | POA: Diagnosis not present

## 2014-07-21 NOTE — Progress Notes (Signed)
Lab only 

## 2014-07-24 LAB — FECAL OCCULT BLOOD, IMMUNOCHEMICAL: Fecal Occult Bld: NEGATIVE

## 2014-09-03 ENCOUNTER — Encounter: Payer: Self-pay | Admitting: Nurse Practitioner

## 2014-09-03 ENCOUNTER — Ambulatory Visit (INDEPENDENT_AMBULATORY_CARE_PROVIDER_SITE_OTHER): Payer: Medicare Other | Admitting: Nurse Practitioner

## 2014-09-03 VITALS — BP 125/78 | HR 78 | Temp 98.3°F | Ht 60.0 in | Wt 149.0 lb

## 2014-09-03 DIAGNOSIS — Z23 Encounter for immunization: Secondary | ICD-10-CM | POA: Diagnosis not present

## 2014-09-03 DIAGNOSIS — M810 Age-related osteoporosis without current pathological fracture: Secondary | ICD-10-CM

## 2014-09-03 DIAGNOSIS — E785 Hyperlipidemia, unspecified: Secondary | ICD-10-CM

## 2014-09-03 DIAGNOSIS — I10 Essential (primary) hypertension: Secondary | ICD-10-CM

## 2014-09-03 DIAGNOSIS — E7800 Pure hypercholesterolemia, unspecified: Secondary | ICD-10-CM

## 2014-09-03 DIAGNOSIS — K219 Gastro-esophageal reflux disease without esophagitis: Secondary | ICD-10-CM | POA: Diagnosis not present

## 2014-09-03 MED ORDER — ATORVASTATIN CALCIUM 10 MG PO TABS: 10.0000 mg | ORAL_TABLET | Freq: Every day | ORAL | Status: DC

## 2014-09-03 MED ORDER — OMEPRAZOLE 20 MG PO CPDR: 20.0000 mg | DELAYED_RELEASE_CAPSULE | Freq: Every day | ORAL | Status: DC

## 2014-09-03 MED ORDER — LISINOPRIL-HYDROCHLOROTHIAZIDE 20-12.5 MG PO TABS: 0.5000 | ORAL_TABLET | Freq: Every day | ORAL | Status: DC

## 2014-09-03 NOTE — Addendum Note (Signed)
Addended by: Jamelle Haring on: 09/03/2014 10:28 AM   Modules accepted: Orders

## 2014-09-03 NOTE — Progress Notes (Signed)
Subjective:    Patient ID: Cheryl Lee, female    DOB: [birth year], 69 y.o.   MRN: [id number]  Patient in today for follow up of chronic medical problems. No acute complaints.   Hypertension This is a chronic problem. The current episode started more than 1 year ago. The problem is controlled. Pertinent negatives include no palpitations. Risk factors for coronary artery disease include dyslipidemia and post-menopausal state. Past treatments include ACE inhibitors and diuretics. The current treatment provides moderate improvement. Compliance problems include diet and exercise.   Hyperlipidemia This is a chronic problem. The current episode started more than 1 year ago. The problem is controlled. Recent lipid tests were reviewed and are variable. She has no history of diabetes, hypothyroidism or obesity. Pertinent negatives include no myalgias. Current antihyperlipidemic treatment includes statins. The current treatment provides moderate improvement of lipids. Compliance problems include adherence to diet and adherence to exercise.  Risk factors for coronary artery disease include dyslipidemia, hypertension and post-menopausal.  OSTEOPOROSIS Currently on alendronate. Tolerating well. Last DEXA was 07/11/11 GERD Omeprazole works well to keep symptoms under control  Review of Systems  Constitutional: Negative.   HENT: Negative.   Respiratory: Negative.   Cardiovascular: Negative for palpitations.  Gastrointestinal: Negative.   Genitourinary: Negative.   Musculoskeletal: Negative for myalgias.  Neurological: Negative.   Psychiatric/Behavioral: Negative.   All other systems reviewed and are negative.      Objective:   Physical Exam  Constitutional: She is oriented to person, place, and time. She appears well-developed and well-nourished.  HENT:  Head: Normocephalic.  Right Ear: External ear normal.  Left Ear: External ear normal.  Mouth/Throat: Oropharynx is clear and moist.  Eyes:  Pupils are equal, round, and reactive to light.  Neck: Normal range of motion. Neck supple. No JVD present.  Cardiovascular: Normal rate, regular rhythm, normal heart sounds and intact distal pulses.  Exam reveals no gallop and no friction rub.   No murmur heard. No peripheral edema bil  Pulmonary/Chest: Effort normal and breath sounds normal.  Abdominal: Soft. Bowel sounds are normal.  Musculoskeletal: Normal range of motion. She exhibits edema.  Trace amt of swelling bil ankles-broke right ankle 10/04/12    Neurological: She is alert and oriented to person, place, and time.  Skin: Skin is warm and dry.  Psychiatric: She has a normal mood and affect. Her behavior is normal. Judgment and thought content normal.  Vitals reviewed.  BP 125/78 mmHg  Pulse 78  Temp(Src) 98.3 F (36.8 C) (Oral)  Ht 5' (1.524 m)  Wt 149 lb (67.586 kg)  BMI 29.10 kg/m2  LMP 08/05/1991        Assessment & Plan:   1. Essential hypertension Do not add salt to diet - CMP14+EGFR - lisinopril-hydrochlorothiazide (PRINZIDE,ZESTORETIC) 20-12.5 MG per tablet; Take 0.5 tablets by mouth daily.  Dispense: 45 tablet; Refill: 1  2. Gastroesophageal reflux disease without esophagitis Avoid spicy foods Do not eat 2 hours prior to bedtime - omeprazole (PRILOSEC) 20 MG capsule; Take 1 capsule (20 mg total) by mouth daily.  Dispense: 90 capsule; Refill: 1  3. Osteoporosis Weight bearing exercises  4. Hyperlipidemia with target LDL less than 100 Low fat diet - NMR, lipoprofile - atorvastatin (LIPITOR) 10 MG tablet; Take 1 tablet (10 mg total) by mouth daily.  Dispense: 90 tablet; Refill: 1   prevnar 13 today Labs pending Health maintenance reviewed Diet and exercise encouraged Continue all meds Follow up  In 3 months   [place], FNP

## 2014-09-03 NOTE — Patient Instructions (Signed)

## 2014-11-01 DIAGNOSIS — Z1231 Encounter for screening mammogram for malignant neoplasm of breast: Secondary | ICD-10-CM | POA: Diagnosis not present

## 2014-11-02 DIAGNOSIS — Z1231 Encounter for screening mammogram for malignant neoplasm of breast: Secondary | ICD-10-CM | POA: Diagnosis not present

## 2014-11-22 ENCOUNTER — Encounter: Payer: Self-pay | Admitting: Nurse Practitioner

## 2014-12-08 ENCOUNTER — Ambulatory Visit: Payer: Medicare Other | Admitting: Nurse Practitioner

## 2014-12-21 ENCOUNTER — Encounter: Payer: Self-pay | Admitting: Nurse Practitioner

## 2014-12-21 ENCOUNTER — Ambulatory Visit (INDEPENDENT_AMBULATORY_CARE_PROVIDER_SITE_OTHER): Payer: Medicare Other | Admitting: Nurse Practitioner

## 2014-12-21 VITALS — BP 104/74 | HR 103 | Temp 97.1°F | Ht 60.0 in | Wt 149.0 lb

## 2014-12-21 DIAGNOSIS — K219 Gastro-esophageal reflux disease without esophagitis: Secondary | ICD-10-CM | POA: Diagnosis not present

## 2014-12-21 DIAGNOSIS — E663 Overweight: Secondary | ICD-10-CM | POA: Insufficient documentation

## 2014-12-21 DIAGNOSIS — I10 Essential (primary) hypertension: Secondary | ICD-10-CM

## 2014-12-21 DIAGNOSIS — E7800 Pure hypercholesterolemia, unspecified: Secondary | ICD-10-CM

## 2014-12-21 DIAGNOSIS — M810 Age-related osteoporosis without current pathological fracture: Secondary | ICD-10-CM | POA: Diagnosis not present

## 2014-12-21 DIAGNOSIS — Z6829 Body mass index (BMI) 29.0-29.9, adult: Secondary | ICD-10-CM

## 2014-12-21 DIAGNOSIS — E785 Hyperlipidemia, unspecified: Secondary | ICD-10-CM

## 2014-12-21 MED ORDER — ATORVASTATIN CALCIUM 10 MG PO TABS: 10.0000 mg | ORAL_TABLET | Freq: Every day | ORAL | Status: DC

## 2014-12-21 MED ORDER — OMEPRAZOLE 20 MG PO CPDR: 20.0000 mg | DELAYED_RELEASE_CAPSULE | Freq: Every day | ORAL | Status: DC

## 2014-12-21 MED ORDER — LISINOPRIL-HYDROCHLOROTHIAZIDE 20-12.5 MG PO TABS: 0.5000 | ORAL_TABLET | Freq: Every day | ORAL | Status: DC

## 2014-12-21 MED ORDER — ALENDRONATE SODIUM 70 MG PO TABS: 70.0000 mg | ORAL_TABLET | ORAL | Status: DC

## 2014-12-21 NOTE — Patient Instructions (Signed)

## 2014-12-21 NOTE — Progress Notes (Signed)
Subjective:    Patient ID: Cheryl Lee, female    DOB: 23-Nov-1945, 69 y.o.   MRN: [id number]  Patient in today for follow up of chronic medical problems. No acute complaints.   Hypertension This is a chronic problem. The current episode started more than 1 year ago. The problem is controlled. Pertinent negatives include no palpitations. Risk factors for coronary artery disease include dyslipidemia and post-menopausal state. Past treatments include ACE inhibitors and diuretics. The current treatment provides moderate improvement. Compliance problems include diet and exercise.   Hyperlipidemia This is a chronic problem. The current episode started more than 1 year ago. The problem is controlled. Recent lipid tests were reviewed and are variable. She has no history of diabetes, hypothyroidism or obesity. Pertinent negatives include no myalgias. Current antihyperlipidemic treatment includes statins. The current treatment provides moderate improvement of lipids. Compliance problems include adherence to diet and adherence to exercise.  Risk factors for coronary artery disease include dyslipidemia, hypertension and post-menopausal.  OSTEOPOROSIS Currently on alendronate. Tolerating well. Last DEXA was 07/11/11 GERD Omeprazole works well to keep symptoms under control  Review of Systems  Constitutional: Negative.   HENT: Negative.   Respiratory: Negative.   Cardiovascular: Negative for palpitations.  Gastrointestinal: Negative.   Genitourinary: Negative.   Musculoskeletal: Negative for myalgias.  Neurological: Negative.   Psychiatric/Behavioral: Negative.   All other systems reviewed and are negative.      Objective:   Physical Exam  Constitutional: She is oriented to person, place, and time. She appears well-developed and well-nourished.  HENT:  Head: Normocephalic.  Right Ear: External ear normal.  Left Ear: External ear normal.  Mouth/Throat: Oropharynx is clear and moist.  Eyes:  Pupils are equal, round, and reactive to light.  Neck: Normal range of motion. Neck supple. No JVD present.  Cardiovascular: Normal rate, regular rhythm, normal heart sounds and intact distal pulses.  Exam reveals no gallop and no friction rub.   No murmur heard. No peripheral edema bil  Pulmonary/Chest: Effort normal and breath sounds normal.  Abdominal: Soft. Bowel sounds are normal.  Musculoskeletal: Normal range of motion. She exhibits edema.  Trace amt of swelling bil ankles-broke right ankle 10/04/12 Compression hose in place    Neurological: She is alert and oriented to person, place, and time.  Skin: Skin is warm and dry.  Psychiatric: She has a normal mood and affect. Her behavior is normal. Judgment and thought content normal.  Vitals reviewed.  BP 104/74 mmHg  Pulse 103  Temp(Src) 97.1 F (36.2 C) (Oral)  Ht 5' (1.524 m)  Wt 149 lb (67.586 kg)  BMI 29.10 kg/m2  LMP 08/05/1991        Assessment & Plan:   1. Essential hypertension Do not add salt to diet - lisinopril-hydrochlorothiazide (PRINZIDE,ZESTORETIC) 20-12.5 MG per tablet; Take 0.5 tablets by mouth daily.  Dispense: 45 tablet; Refill: 1 - CMP14+EGFR  2. Gastroesophageal reflux disease without esophagitis Avoid spicy foods Do not eat 2 hours prior to bedtime - omeprazole (PRILOSEC) 20 MG capsule; Take 1 capsule (20 mg total) by mouth daily.  Dispense: 90 capsule; Refill: 1  3. Osteoporosis Weight bearing execises  4. Hyperlipidemia with target LDL less than 100 Low fat diet - Lipid panel - atorvastatin (LIPITOR) 10 MG tablet; Take 1 tablet (10 mg total) by mouth daily.  Dispense: 90 tablet; Refill: 1   5. BMI 29.0-29.9,adult Discussed diet and exercise for person with BMI >25 Will recheck weight in 3-6 months    Labs  pending Health maintenance reviewed Diet and exercise encouraged Continue all meds Follow up  In 3 months   [place], FNP

## 2014-12-21 NOTE — Addendum Note (Signed)
Addended by: Chevis Pretty on: 12/21/2014 12:46 PM   Modules accepted: Orders

## 2014-12-22 LAB — CMP14+EGFR
ALT: 27 IU/L (ref 0–32)
AST: 20 IU/L (ref 0–40)
Albumin/Globulin Ratio: 1.8 (ref 1.1–2.5)
Albumin: 4.2 g/dL (ref 3.6–4.8)
Alkaline Phosphatase: 68 IU/L (ref 39–117)
BUN / CREAT RATIO: 17 (ref 11–26)
BUN: 23 mg/dL (ref 8–27)
Bilirubin Total: 0.3 mg/dL (ref 0.0–1.2)
CO2: 23 mmol/L (ref 18–29)
CREATININE: 1.37 mg/dL — AB (ref 0.57–1.00)
Calcium: 10.3 mg/dL (ref 8.7–10.3)
Chloride: 99 mmol/L (ref 97–108)
GFR calc non Af Amer: 40 mL/min/{1.73_m2} — ABNORMAL LOW (ref >59)
GFR, EST AFRICAN AMERICAN: 46 mL/min/{1.73_m2} — AB (ref >59)
GLOBULIN, TOTAL: 2.3 g/dL (ref 1.5–4.5)
Glucose: 99 mg/dL (ref 65–99)
Potassium: 4.5 mmol/L (ref 3.5–5.2)
SODIUM: 140 mmol/L (ref 134–144)
Total Protein: 6.5 g/dL (ref 6.0–8.5)

## 2014-12-22 LAB — LIPID PANEL
Chol/HDL Ratio: 3.3 ratio units (ref 0.0–4.4)
Cholesterol, Total: 163 mg/dL (ref 100–199)
HDL: 49 mg/dL (ref >39)
LDL Calculated: 81 mg/dL (ref 0–99)
Triglycerides: 165 mg/dL — ABNORMAL HIGH (ref 0–149)
VLDL CHOLESTEROL CAL: 33 mg/dL (ref 5–40)

## 2015-02-28 ENCOUNTER — Encounter: Payer: Self-pay | Admitting: Nurse Practitioner

## 2015-02-28 ENCOUNTER — Ambulatory Visit (INDEPENDENT_AMBULATORY_CARE_PROVIDER_SITE_OTHER): Payer: Medicare Other | Admitting: Nurse Practitioner

## 2015-02-28 VITALS — BP 119/86 | HR 107 | Temp 98.0°F | Ht 60.0 in | Wt 148.0 lb

## 2015-02-28 DIAGNOSIS — Z0000 Encounter for general adult medical examination without abnormal findings: Secondary | ICD-10-CM | POA: Diagnosis not present

## 2015-02-28 NOTE — Patient Instructions (Signed)

## 2015-02-28 NOTE — Progress Notes (Signed)
Subjective:    Cheryl Lee is a 69 y.o. female who presents for Medicare Annual/Subsequent preventive examination.  Preventive Screening-Counseling & Management  Tobacco History  Smoking status  . Never Smoker   Smokeless tobacco  . Not on file     Problems Prior to Visit- none  Current Problems (verified) Patient Active Problem List   Diagnosis Date Noted  . BMI 29.0-29.9,adult 12/21/2014  . GERD (gastroesophageal reflux disease)   . Hyperlipidemia with target LDL less than 100   . Hypertension   . Osteoporosis     Medications Prior to Visit Current Outpatient Prescriptions on File Prior to Visit  Medication Sig Dispense Refill  . alendronate (FOSAMAX) 70 MG tablet Take 1 tablet (70 mg total) by mouth every 7 (seven) days. Take with a full glass of water on an empty stomach. 12 tablet 1  . aspirin 81 MG tablet Take 81 mg by mouth daily.    Marland Kitchen atorvastatin (LIPITOR) 10 MG tablet Take 1 tablet (10 mg total) by mouth daily. 90 tablet 1  . fluticasone (FLONASE) 50 MCG/ACT nasal spray Place into both nostrils daily.    Marland Kitchen lisinopril-hydrochlorothiazide (PRINZIDE,ZESTORETIC) 20-12.5 MG per tablet Take 0.5 tablets by mouth daily. 45 tablet 1  . Multiple Vitamins-Minerals (MULTIVITAMINS THER. W/MINERALS) TABS Take 1 tablet by mouth daily.      Marland Kitchen omeprazole (PRILOSEC) 20 MG capsule Take 1 capsule (20 mg total) by mouth daily. 90 capsule 1  . vitamin C (ASCORBIC ACID) 500 MG tablet Take 500 mg by mouth daily.       No current facility-administered medications on file prior to visit.    Current Medications (verified) Current Outpatient Prescriptions  Medication Sig Dispense Refill  . alendronate (FOSAMAX) 70 MG tablet Take 1 tablet (70 mg total) by mouth every 7 (seven) days. Take with a full glass of water on an empty stomach. 12 tablet 1  . aspirin 81 MG tablet Take 81 mg by mouth daily.    Marland Kitchen atorvastatin (LIPITOR) 10 MG tablet Take 1 tablet (10 mg total) by mouth daily. 90  tablet 1  . fluticasone (FLONASE) 50 MCG/ACT nasal spray Place into both nostrils daily.    Marland Kitchen lisinopril-hydrochlorothiazide (PRINZIDE,ZESTORETIC) 20-12.5 MG per tablet Take 0.5 tablets by mouth daily. 45 tablet 1  . Multiple Vitamins-Minerals (MULTIVITAMINS THER. W/MINERALS) TABS Take 1 tablet by mouth daily.      Marland Kitchen omeprazole (PRILOSEC) 20 MG capsule Take 1 capsule (20 mg total) by mouth daily. 90 capsule 1  . vitamin C (ASCORBIC ACID) 500 MG tablet Take 500 mg by mouth daily.       No current facility-administered medications for this visit.     Allergies (verified) Lycopene   PAST HISTORY  Family History Family History  Problem Relation Age of Onset  . Cancer Mother   . Alzheimer's disease Mother   . Heart disease Mother   . Early death Father   . Diabetes Sister   . Diabetes Sister   . Hypertension Sister     Social History Social History  Substance Use Topics  . Smoking status: Never Smoker   . Smokeless tobacco: Not on file  . Alcohol Use: No     Are there smokers in your home (other than you)? No  Risk Factors Current exercise habits: Home exercise routine includes walking 5 hrs per week. Exercise is limited by  Dietary issues discussed: well balanced- avoids adding salt   Cardiac risk factors: advanced age (older than 55  for men, 2 for women), family history of premature cardiovascular disease and hypertension.  Depression Screen (Note: if answer to either of the following is "Yes", a more complete depression screening is indicated)   Over the past two weeks, have you felt down, depressed or hopeless? No  Over the past two weeks, have you felt little interest or pleasure in doing things? No  Have you lost interest or pleasure in daily life? No  Do you often feel hopeless? No  Do you cry easily over simple problems? No  Activities of Daily Living In your present state of health, do you have any difficulty performing the following activities?:  Driving?  No Managing money?  No Feeding yourself? No Getting from bed to chair? NoNo exam performed today, AWV. Climbing a flight of stairs? No Preparing food and eating?: No Bathing or showering? No Getting dressed: No Getting to the toilet? No Using the toilet:No Moving around from place to place: No In the past year have you fallen or had a near fall?:No   Are you sexually active?  No  Do you have more than one partner?  No  Hearing Difficulties: No Do you often ask people to speak up or repeat themselves? No Do you experience ringing or noises in your ears? No Do you have difficulty understanding soft or whispered voices? No   Do you feel that you have a problem with memory? No  Do you often misplace items? No  Do you feel safe at home?  No  Cognitive Testing  Alert? Yes  Normal Appearance?Yes  Oriented to person? Yes  Place? Yes   Time? Yes  Recall of three objects?  Yes  Can perform simple calculations? Yes  Displays appropriate judgment?Yes  Can read the correct time from a watch face?Yes   Advanced Directives have been discussed with the patient? Yes  List the Names of Other Physician/Practitioners you currently use: 1.    Indicate any recent Medical Services you may have received from other than [facility] providers in the past year (date may be approximate).  Immunization History  Administered Date(s) Administered  . Pneumococcal Conjugate-13 09/03/2014    Screening Tests Health Maintenance  Topic Date Due  . INFLUENZA VACCINE  12/13/2014  . Hepatitis C Screening  12/21/2015 (Originally 1945/11/20)  . COLON CANCER SCREENING ANNUAL FOBT  07/24/2015  . PNA vac Low Risk Adult (2 of 2 - PPSV23) 09/03/2015  . MAMMOGRAM  11/01/2016  . COLONOSCOPY  08/13/2018  . TETANUS/TDAP  12/12/2021  . DEXA SCAN  Completed  . ZOSTAVAX  Completed    All answers were reviewed with the patient and necessary referrals were made:  Chevis Pretty, [place]   02/28/2015   History  reviewed: allergies, current medications, past family history, past medical history, past social history, past surgical history and problem list  Review of Systems Pertinent items are noted in HPI.    Objective:     Vision by Snellen chart: right RCV:ELFYBOF Blind, left eye:20/50  There is no weight on file to calculate BMI. LMP 08/05/1991  No exam performed today, AWV.     Assessment:     AWV      Plan:     During the course of the visit the patient was educated and counseled about appropriate screening and preventive services including:    Health Maintaince Up to date  Diet review for nutrition referral? Yes ____  Not Indicated ____   Patient Instructions (the written plan) was given  to the patient.  Medicare Attestation I have personally reviewed: The patient's medical and social history Their use of alcohol, tobacco or illicit drugs Their current medications and supplements The patient's functional ability including ADLs,fall risks, home safety risks, cognitive, and hearing and visual impairment Diet and physical activities Evidence for depression or mood disorders  The patient's weight, height, BMI, and visual acuity have been recorded in the chart.  I have made referrals, counseling, and provided education to the patient based on review of the above and I have provided the patient with a written personalized care plan for preventive services.     Chevis Pretty, FNP   02/28/2015

## 2015-03-31 ENCOUNTER — Ambulatory Visit (INDEPENDENT_AMBULATORY_CARE_PROVIDER_SITE_OTHER): Payer: Medicare Other | Admitting: Nurse Practitioner

## 2015-03-31 ENCOUNTER — Encounter: Payer: Self-pay | Admitting: Nurse Practitioner

## 2015-03-31 VITALS — BP 133/79 | HR 81 | Temp 97.3°F | Ht 60.0 in | Wt 149.0 lb

## 2015-03-31 DIAGNOSIS — K219 Gastro-esophageal reflux disease without esophagitis: Secondary | ICD-10-CM | POA: Diagnosis not present

## 2015-03-31 DIAGNOSIS — I10 Essential (primary) hypertension: Secondary | ICD-10-CM | POA: Diagnosis not present

## 2015-03-31 DIAGNOSIS — E7800 Pure hypercholesterolemia, unspecified: Secondary | ICD-10-CM

## 2015-03-31 DIAGNOSIS — Z1159 Encounter for screening for other viral diseases: Secondary | ICD-10-CM | POA: Diagnosis not present

## 2015-03-31 DIAGNOSIS — M810 Age-related osteoporosis without current pathological fracture: Secondary | ICD-10-CM

## 2015-03-31 DIAGNOSIS — E785 Hyperlipidemia, unspecified: Secondary | ICD-10-CM | POA: Diagnosis not present

## 2015-03-31 DIAGNOSIS — Z6829 Body mass index (BMI) 29.0-29.9, adult: Secondary | ICD-10-CM | POA: Diagnosis not present

## 2015-03-31 MED ORDER — OMEPRAZOLE 20 MG PO CPDR: 20.0000 mg | DELAYED_RELEASE_CAPSULE | Freq: Every day | ORAL | Status: DC

## 2015-03-31 MED ORDER — LISINOPRIL-HYDROCHLOROTHIAZIDE 20-12.5 MG PO TABS: 0.5000 | ORAL_TABLET | Freq: Every day | ORAL | Status: DC

## 2015-03-31 MED ORDER — ALENDRONATE SODIUM 70 MG PO TABS: 70.0000 mg | ORAL_TABLET | ORAL | Status: DC

## 2015-03-31 MED ORDER — ATORVASTATIN CALCIUM 10 MG PO TABS: 10.0000 mg | ORAL_TABLET | Freq: Every day | ORAL | Status: DC

## 2015-03-31 NOTE — Addendum Note (Signed)
Addended by: Chevis Pretty on: 03/31/2015 10:53 AM   Modules accepted: Orders

## 2015-03-31 NOTE — Patient Instructions (Signed)
Bone Health Bones protect organs, store calcium, and anchor muscles. Good health habits, such as eating nutritious foods and exercising regularly, are important for maintaining healthy bones. They can also help to prevent a condition that causes bones to lose density and become weak and brittle (osteoporosis). WHY IS BONE MASS IMPORTANT? Bone mass refers to the amount of bone tissue that you have. The higher your bone mass, the stronger your bones. An important step toward having healthy bones throughout life is to have strong and dense bones during childhood. A young adult who has a high bone mass is more likely to have a high bone mass later in life. Bone mass at its greatest it is called peak bone mass. A large decline in bone mass occurs in older adults. In women, it occurs about the time of menopause. During this time, it is important to practice good health habits, because if more bone is lost than what is replaced, the bones will become less healthy and more likely to break (fracture). If you find that you have a low bone mass, you may be able to prevent osteoporosis or further bone loss by changing your diet and lifestyle. HOW CAN I FIND OUT IF MY BONE MASS IS LOW? Bone mass can be measured with an X-ray test that is called a bone mineral density (BMD) test. This test is recommended for all women who are age 65 or older. It may also be recommended for men who are age 70 or older, or for people who are more likely to develop osteoporosis due to:  Having bones that break easily.  Having a long-term disease that weakens bones, such as kidney disease or rheumatoid arthritis.  Having menopause earlier than normal.  Taking medicine that weakens bones, such as steroids, thyroid hormones, or hormone treatment for breast cancer or prostate cancer.  Smoking.  Drinking three or more alcoholic drinks each day. WHAT ARE THE NUTRITIONAL RECOMMENDATIONS FOR HEALTHY BONES? To have healthy bones, you need  to get enough of the right minerals and vitamins. Most nutrition experts recommend getting these nutrients from the foods that you eat. Nutritional recommendations vary from person to person. Ask your health care provider what is healthy for you. Here are some general guidelines. Calcium Recommendations Calcium is the most important (essential) mineral for bone health. Most people can get enough calcium from their diet, but supplements may be recommended for people who are at risk for osteoporosis. Good sources of calcium include:  Dairy products, such as low-fat or nonfat milk, cheese, and yogurt.  Dark green leafy vegetables, such as bok choy and broccoli.  Calcium-fortified foods, such as orange juice, cereal, bread, soy beverages, and tofu products.  Nuts, such as almonds. Follow these recommended amounts for daily calcium intake:  Children, age 1-3: 700 mg.  Children, age 4-8: 1,000 mg.  Children, age 9-13: 1,300 mg.  Teens, age 14-18: 1,300 mg.  Adults, age 19-50: 1,000 mg.  Adults, age 51-70:  Men: 1,000 mg.  Women: 1,200 mg.  Adults, age 71 or older: 1,200 mg.  Pregnant and breastfeeding females:  Teens: 1,300 mg.  Adults: 1,000 mg. Vitamin D Recommendations Vitamin D is the most essential vitamin for bone health. It helps the body to absorb calcium. Sunlight stimulates the skin to make vitamin D, so be sure to get enough sunlight. If you live in a cold climate or you do not get outside often, your health care provider may recommend that you take vitamin D supplements. Good   sources of vitamin D in your diet include:  Egg yolks.  Saltwater fish.  Milk and cereal fortified with vitamin D. Follow these recommended amounts for daily vitamin D intake:  Children and teens, age 1-18: 600 international units.  Adults, age 50 or younger: 400-800 international units.  Adults, age 51 or older: 800-1,000 international units. Other Nutrients Other nutrients for bone  health include:  Phosphorus. This mineral is found in meat, poultry, dairy foods, nuts, and legumes. The recommended daily intake for adult men and adult women is 700 mg.  Magnesium. This mineral is found in seeds, nuts, dark green vegetables, and legumes. The recommended daily intake for adult men is 400-420 mg. For adult women, it is 310-320 mg.  Vitamin K. This vitamin is found in green leafy vegetables. The recommended daily intake is 120 mg for adult men and 90 mg for adult women. WHAT TYPE OF PHYSICAL ACTIVITY IS BEST FOR BUILDING AND MAINTAINING HEALTHY BONES? Weight-bearing and strength-building activities are important for building and maintaining peak bone mass. Weight-bearing activities cause muscles and bones to work against gravity. Strength-building activities increases muscle strength that supports bones. Weight-bearing and muscle-building activities include:  Walking and hiking.  Jogging and running.  Dancing.  Gym exercises.  Lifting weights.  Tennis and racquetball.  Climbing stairs.  Aerobics. Adults should get at least 30 minutes of moderate physical activity on most days. Children should get at least 60 minutes of moderate physical activity on most days. Ask your health care provide what type of exercise is best for you. WHERE CAN I FIND MORE INFORMATION? For more information, check out the following websites:  National Osteoporosis Foundation: http://nof.org/learn/basics  National Institutes of Health: http://www.niams.nih.gov/Health_Info/Bone/Bone_Health/bone_health_for_life.asp   This information is not intended to replace advice given to you by your health care provider. Make sure you discuss any questions you have with your health care provider.   Document Released: 07/21/2003 Document Revised: 09/14/2014 Document Reviewed: 05/05/2014 Elsevier Interactive Patient Education 2016 [employer]  

## 2015-03-31 NOTE — Progress Notes (Signed)
Subjective:    Patient ID: Cheryl Lee, female    DOB: 12/04/1945, 69 y.o.   MRN: [id number]  Patient in today for follow up of chronic medical problems. No acute complaints.   Hypertension This is a chronic problem. The current episode started more than 1 year ago. The problem is controlled. Pertinent negatives include no palpitations. Risk factors for coronary artery disease include dyslipidemia and post-menopausal state. Past treatments include ACE inhibitors and diuretics. The current treatment provides moderate improvement. Compliance problems include diet and exercise.   Hyperlipidemia This is a chronic problem. The current episode started more than 1 year ago. The problem is controlled. Recent lipid tests were reviewed and are variable. She has no history of diabetes, hypothyroidism or obesity. Pertinent negatives include no myalgias. Current antihyperlipidemic treatment includes statins. The current treatment provides moderate improvement of lipids. Compliance problems include adherence to diet and adherence to exercise.  Risk factors for coronary artery disease include dyslipidemia, hypertension and post-menopausal.  OSTEOPOROSIS Currently on alendronate. Tolerating well. Last DEXA was 07/11/11 GERD Omeprazole works well to keep symptoms under control  Review of Systems  Constitutional: Negative.   HENT: Negative.   Respiratory: Negative.   Cardiovascular: Negative for palpitations.  Gastrointestinal: Negative.   Genitourinary: Negative.   Musculoskeletal: Negative for myalgias.  Neurological: Negative.   Psychiatric/Behavioral: Negative.   All other systems reviewed and are negative.      Objective:   Physical Exam  Constitutional: She is oriented to person, place, and time. She appears well-developed and well-nourished.  HENT:  Head: Normocephalic.  Right Ear: External ear normal.  Left Ear: External ear normal.  Mouth/Throat: Oropharynx is clear and moist.  Eyes:  Pupils are equal, round, and reactive to light.  Neck: Normal range of motion. Neck supple. No JVD present.  Cardiovascular: Normal rate, regular rhythm, normal heart sounds and intact distal pulses.  Exam reveals no gallop and no friction rub.   No murmur heard. No peripheral edema bil  Pulmonary/Chest: Effort normal and breath sounds normal.  Abdominal: Soft. Bowel sounds are normal.  Musculoskeletal: Normal range of motion. She exhibits edema.  Trace amt of swelling bil ankles-broke right ankle 10/04/12 Compression hose in place    Neurological: She is alert and oriented to person, place, and time.  Skin: Skin is warm and dry.  Psychiatric: She has a normal mood and affect. Her behavior is normal. Judgment and thought content normal.  Vitals reviewed.  BP 133/79 mmHg  Pulse 81  Temp(Src) 97.3 F (36.3 C) (Oral)  Ht 5' (1.524 m)  Wt 149 lb (67.586 kg)  BMI 29.10 kg/m2  LMP 08/05/1991        Assessment & Plan:   1. Essential hypertension Do not add salt to diet - lisinopril-hydrochlorothiazide (PRINZIDE,ZESTORETIC) 20-12.5 MG tablet; Take 0.5 tablets by mouth daily.  Dispense: 45 tablet; Refill: 1  2. Gastroesophageal reflux disease without esophagitis Avoid spicy foods Do not eat 2 hours prior to bedtime   - omeprazole (PRILOSEC) 20 MG capsule; Take 1 capsule (20 mg total) by mouth daily.  Dispense: 90 capsule; Refill: 1  3. Hyperlipidemia with target LDL less than 100 Low fat diet  - atorvastatin (LIPITOR) 10 MG tablet; Take 1 tablet (10 mg total) by mouth daily.  Dispense: 90 tablet; Refill: 1   4. BMI 29.0-29.9,adult Discussed diet and exercise for person with BMI >25 Will recheck weight in 3-6 months   5. Osteoporosis Weight bearing exercises - alendronate (FOSAMAX) 70 MG tablet;  Take 1 tablet (70 mg total) by mouth every 7 (seven) days. Take with a full glass of water on an empty stomach.  Dispense: 12 tablet; Refill: 1   7. Need for hepatitis C  screening test - Hepatitis C antibody    Labs pending Health maintenance reviewed Diet and exercise encouraged Continue all meds Follow up  In 3 months  [place], FNP

## 2015-04-01 LAB — CMP14+EGFR
A/G RATIO: 2.3 (ref 1.1–2.5)
ALT: 13 IU/L (ref 0–32)
AST: 14 IU/L (ref 0–40)
Albumin: 4.1 g/dL (ref 3.6–4.8)
Alkaline Phosphatase: 66 IU/L (ref 39–117)
BILIRUBIN TOTAL: 0.5 mg/dL (ref 0.0–1.2)
BUN/Creatinine Ratio: 20 (ref 11–26)
BUN: 18 mg/dL (ref 8–27)
CO2: 24 mmol/L (ref 18–29)
Calcium: 10.1 mg/dL (ref 8.7–10.3)
Chloride: 100 mmol/L (ref 97–106)
Creatinine, Ser: 0.92 mg/dL (ref 0.57–1.00)
GFR calc Af Amer: 73 mL/min/{1.73_m2} (ref >59)
GFR calc non Af Amer: 64 mL/min/{1.73_m2} (ref >59)
Globulin, Total: 1.8 g/dL (ref 1.5–4.5)
Glucose: 91 mg/dL (ref 65–99)
POTASSIUM: 4.4 mmol/L (ref 3.5–5.2)
SODIUM: 138 mmol/L (ref 136–144)
Total Protein: 5.9 g/dL — ABNORMAL LOW (ref 6.0–8.5)

## 2015-04-01 LAB — HEPATITIS C ANTIBODY

## 2015-04-01 LAB — LIPID PANEL
Chol/HDL Ratio: 3.2 ratio units (ref 0.0–4.4)
Cholesterol, Total: 168 mg/dL (ref 100–199)
HDL: 53 mg/dL (ref >39)
LDL Calculated: 84 mg/dL (ref 0–99)
TRIGLYCERIDES: 155 mg/dL — AB (ref 0–149)
VLDL Cholesterol Cal: 31 mg/dL (ref 5–40)

## 2015-07-04 ENCOUNTER — Ambulatory Visit (INDEPENDENT_AMBULATORY_CARE_PROVIDER_SITE_OTHER): Payer: Medicare Other | Admitting: Nurse Practitioner

## 2015-07-04 ENCOUNTER — Ambulatory Visit (INDEPENDENT_AMBULATORY_CARE_PROVIDER_SITE_OTHER): Payer: Medicare Other

## 2015-07-04 ENCOUNTER — Encounter: Payer: Self-pay | Admitting: Nurse Practitioner

## 2015-07-04 VITALS — BP 127/78 | HR 75 | Temp 97.3°F | Ht 60.0 in | Wt 149.0 lb

## 2015-07-04 DIAGNOSIS — I10 Essential (primary) hypertension: Secondary | ICD-10-CM

## 2015-07-04 DIAGNOSIS — E7800 Pure hypercholesterolemia, unspecified: Secondary | ICD-10-CM

## 2015-07-04 DIAGNOSIS — K219 Gastro-esophageal reflux disease without esophagitis: Secondary | ICD-10-CM | POA: Diagnosis not present

## 2015-07-04 MED ORDER — LISINOPRIL-HYDROCHLOROTHIAZIDE 20-12.5 MG PO TABS: 0.5000 | ORAL_TABLET | Freq: Every day | ORAL | Status: DC

## 2015-07-04 MED ORDER — OMEPRAZOLE 20 MG PO CPDR: 20.0000 mg | DELAYED_RELEASE_CAPSULE | Freq: Every day | ORAL | Status: DC

## 2015-07-04 MED ORDER — ATORVASTATIN CALCIUM 10 MG PO TABS: 10.0000 mg | ORAL_TABLET | Freq: Every day | ORAL | Status: DC

## 2015-07-04 NOTE — Patient Instructions (Signed)
Fall Prevention in the Home  Falls can cause injuries and can affect people from all age groups. There are many simple things that you can do to make your home safe and to help prevent falls. WHAT CAN I DO ON THE OUTSIDE OF MY HOME?  Regularly repair the edges of walkways and driveways and fix any cracks.  Remove high doorway thresholds.  Trim any shrubbery on the main path into your home.  Use bright outdoor lighting.  Clear walkways of debris and clutter, including tools and rocks.  Regularly check that handrails are securely fastened and in good repair. Both sides of any steps should have handrails.  Install guardrails along the edges of any raised decks or porches.  Have leaves, snow, and ice cleared regularly.  Use sand or salt on walkways during winter months.  In the garage, clean up any spills right away, including grease or oil spills. WHAT CAN I DO IN THE BATHROOM?  Use night lights.  Install grab bars by the toilet and in the tub and shower. Do not use towel bars as grab bars.  Use non-skid mats or decals on the floor of the tub or shower.  If you need to sit down while you are in the shower, use a plastic, non-slip stool..  Keep the floor dry. Immediately clean up any water that spills on the floor.  Remove soap buildup in the tub or shower on a regular basis.  Attach bath mats securely with double-sided non-slip rug tape.  Remove throw rugs and other tripping hazards from the floor. WHAT CAN I DO IN THE BEDROOM?  Use night lights.  Make sure that a bedside light is easy to reach.  Do not use oversized bedding that drapes onto the floor.  Have a firm chair that has side arms to use for getting dressed.  Remove throw rugs and other tripping hazards from the floor. WHAT CAN I DO IN THE KITCHEN?   Clean up any spills right away.  Avoid walking on wet floors.  Place frequently used items in easy-to-reach places.  If you need to reach for something  above you, use a sturdy step stool that has a grab bar.  Keep electrical cables out of the way.  Do not use floor polish or wax that makes floors slippery. If you have to use wax, make sure that it is non-skid floor wax.  Remove throw rugs and other tripping hazards from the floor. WHAT CAN I DO IN THE STAIRWAYS?  Do not leave any items on the stairs.  Make sure that there are handrails on both sides of the stairs. Fix handrails that are broken or loose. Make sure that handrails are as long as the stairways.  Check any carpeting to make sure that it is firmly attached to the stairs. Fix any carpet that is loose or worn.  Avoid having throw rugs at the top or bottom of stairways, or secure the rugs with carpet tape to prevent them from moving.  Make sure that you have a light switch at the top of the stairs and the bottom of the stairs. If you do not have them, have them installed. WHAT ARE SOME OTHER FALL PREVENTION TIPS?  Wear closed-toe shoes that fit well and support your feet. Wear shoes that have rubber soles or low heels.  When you use a stepladder, make sure that it is completely opened and that the sides are firmly locked. Have someone hold the ladder while you   are using it. Do not climb a closed stepladder.  Add color or contrast paint or tape to grab bars and handrails in your home. Place contrasting color strips on the first and last steps.  Use mobility aids as needed, such as canes, walkers, scooters, and crutches.  Turn on lights if it is dark. Replace any light bulbs that burn out.  Set up furniture so that there are clear paths. Keep the furniture in the same spot.  Fix any uneven floor surfaces.  Choose a carpet design that does not hide the edge of steps of a stairway.  Be aware of any and all pets.  Review your medicines with your healthcare provider. Some medicines can cause dizziness or changes in blood pressure, which increase your risk of falling. Talk  with your health care provider about other ways that you can decrease your risk of falls. This may include working with a physical therapist or trainer to improve your strength, balance, and endurance.   This information is not intended to replace advice given to you by your health care provider. Make sure you discuss any questions you have with your health care provider.   Document Released: 04/20/2002 Document Revised: 09/14/2014 Document Reviewed: 06/04/2014 Elsevier Interactive Patient Education 2016 [employer]  

## 2015-07-04 NOTE — Progress Notes (Signed)
Subjective:    Patient ID: Cheryl Lee, female    DOB: March 03, 1946, 70 y.o.   MRN: [id number]  Patient here today for follow up of chronic medical problems.  Outpatient Encounter Prescriptions as of 07/04/2015  Medication Sig  . alendronate (FOSAMAX) 70 MG tablet Take 1 tablet (70 mg total) by mouth every 7 (seven) days. Take with a full glass of water on an empty stomach.  Marland Kitchen aspirin 81 MG tablet Take 81 mg by mouth daily.  Marland Kitchen atorvastatin (LIPITOR) 10 MG tablet Take 1 tablet (10 mg total) by mouth daily.  . fluticasone (FLONASE) 50 MCG/ACT nasal spray Place into both nostrils daily.  Marland Kitchen lisinopril-hydrochlorothiazide (PRINZIDE,ZESTORETIC) 20-12.5 MG tablet Take 0.5 tablets by mouth daily.  . Multiple Vitamins-Minerals (MULTIVITAMINS THER. W/MINERALS) TABS Take 1 tablet by mouth daily.    Marland Kitchen omeprazole (PRILOSEC) 20 MG capsule Take 1 capsule (20 mg total) by mouth daily.  . vitamin C (ASCORBIC ACID) 500 MG tablet Take 500 mg by mouth daily.     No facility-administered encounter medications on file as of 07/04/2015.    Hypertension This is a chronic problem. The current episode started more than 1 year ago. The problem is controlled. Pertinent negatives include no palpitations. Risk factors for coronary artery disease include dyslipidemia and post-menopausal state. Past treatments include ACE inhibitors and diuretics. The current treatment provides moderate improvement. Compliance problems include diet and exercise.   Hyperlipidemia This is a chronic problem. The current episode started more than 1 year ago. The problem is controlled. Recent lipid tests were reviewed and are variable. She has no history of diabetes, hypothyroidism or obesity. Pertinent negatives include no myalgias. Current antihyperlipidemic treatment includes statins. The current treatment provides moderate improvement of lipids. Compliance problems include adherence to diet and adherence to exercise.  Risk factors for  coronary artery disease include dyslipidemia, hypertension and post-menopausal.  OSTEOPOROSIS Currently on alendronate. Tolerating well. Last DEXA was 07/11/11 GERD Omeprazole works well to keep symptoms under control  Review of Systems  Constitutional: Negative.   HENT: Negative.   Respiratory: Negative.   Cardiovascular: Negative for palpitations.  Gastrointestinal: Negative.   Genitourinary: Negative.   Musculoskeletal: Negative for myalgias.  Neurological: Negative.   Psychiatric/Behavioral: Negative.   All other systems reviewed and are negative.      Objective:   Physical Exam  Constitutional: She is oriented to person, place, and time. She appears well-developed and well-nourished.  HENT:  Head: Normocephalic.  Right Ear: External ear normal.  Left Ear: External ear normal.  Mouth/Throat: Oropharynx is clear and moist.  Eyes: Pupils are equal, round, and reactive to light.  Neck: Normal range of motion. Neck supple. No JVD present.  Cardiovascular: Normal rate, regular rhythm, normal heart sounds and intact distal pulses.  Exam reveals no gallop and no friction rub.   No murmur heard. No peripheral edema bil  Pulmonary/Chest: Effort normal and breath sounds normal.  Abdominal: Soft. Bowel sounds are normal.  Musculoskeletal: Normal range of motion. She exhibits edema.  Trace amt of swelling bil ankles-broke right ankle 10/04/12 Compression hose in place    Neurological: She is alert and oriented to person, place, and time.  Skin: Skin is warm and dry.  Psychiatric: She has a normal mood and affect. Her behavior is normal. Judgment and thought content normal.  Vitals reviewed.  BP 127/78 mmHg  Pulse 75  Temp(Src) 97.3 F (36.3 C) (Oral)  Ht 5' (1.524 m)  Wt 149 lb (67.586 kg)  BMI  29.10 kg/m2  LMP 08/05/1991  Chest xray- no cardiopulmonary disease-Preliminary reading by Ronnald Collum, FNP  [facility]  EKG- NSR-Mary-Margaret Hassell Done, FNP        Assessment &  Plan:  1. Essential hypertension Do not add salt to diet - lisinopril-hydrochlorothiazide (PRINZIDE,ZESTORETIC) 20-12.5 MG tablet; Take 0.5 tablets by mouth daily.  Dispense: 45 tablet; Refill: 1 - CMP14+EGFR - DG Chest 2 View; Future - EKG 12-Lead  2. Gastroesophageal reflux disease without esophagitis Avoid spicy foods Do not eat 2 hours prior to bedtime - omeprazole (PRILOSEC) 20 MG capsule; Take 1 capsule (20 mg total) by mouth daily.  Dispense: 90 capsule; Refill: 1  3. Hypercholesteremia Low fta diet - atorvastatin (LIPITOR) 10 MG tablet; Take 1 tablet (10 mg total) by mouth daily.  Dispense: 90 tablet; Refill: 1 - Lipid panel    Labs pending Health maintenance reviewed Diet and exercise encouraged Continue all meds Follow up  In 3 months   [place], FNP

## 2015-07-05 LAB — CMP14+EGFR
ALK PHOS: 59 IU/L (ref 39–117)
ALT: 15 IU/L (ref 0–32)
AST: 15 IU/L (ref 0–40)
Albumin/Globulin Ratio: 1.8 (ref 1.1–2.5)
Albumin: 4.3 g/dL (ref 3.6–4.8)
BUN/Creatinine Ratio: 19 (ref 11–26)
BUN: 18 mg/dL (ref 8–27)
Bilirubin Total: 0.4 mg/dL (ref 0.0–1.2)
CALCIUM: 9.8 mg/dL (ref 8.7–10.3)
CO2: 23 mmol/L (ref 18–29)
CREATININE: 0.96 mg/dL (ref 0.57–1.00)
Chloride: 99 mmol/L (ref 96–106)
GFR calc Af Amer: 70 mL/min/{1.73_m2} (ref >59)
GFR calc non Af Amer: 61 mL/min/{1.73_m2} (ref >59)
GLOBULIN, TOTAL: 2.4 g/dL (ref 1.5–4.5)
GLUCOSE: 91 mg/dL (ref 65–99)
Potassium: 4.5 mmol/L (ref 3.5–5.2)
SODIUM: 140 mmol/L (ref 134–144)
Total Protein: 6.7 g/dL (ref 6.0–8.5)

## 2015-07-05 LAB — LIPID PANEL
CHOLESTEROL TOTAL: 166 mg/dL (ref 100–199)
Chol/HDL Ratio: 2.9 ratio units (ref 0.0–4.4)
HDL: 58 mg/dL (ref >39)
LDL CALC: 78 mg/dL (ref 0–99)
TRIGLYCERIDES: 152 mg/dL — AB (ref 0–149)
VLDL Cholesterol Cal: 30 mg/dL (ref 5–40)

## 2015-07-06 NOTE — Progress Notes (Signed)
Patient aware.

## 2015-08-27 DIAGNOSIS — K529 Noninfective gastroenteritis and colitis, unspecified: Secondary | ICD-10-CM | POA: Diagnosis not present

## 2015-08-27 DIAGNOSIS — R1084 Generalized abdominal pain: Secondary | ICD-10-CM | POA: Diagnosis not present

## 2015-10-13 ENCOUNTER — Ambulatory Visit: Payer: Medicare Other | Admitting: Nurse Practitioner

## 2015-10-14 ENCOUNTER — Encounter: Payer: Self-pay | Admitting: *Deleted

## 2015-10-26 ENCOUNTER — Other Ambulatory Visit: Payer: Self-pay

## 2015-10-26 DIAGNOSIS — E7800 Pure hypercholesterolemia, unspecified: Secondary | ICD-10-CM

## 2015-10-26 DIAGNOSIS — I10 Essential (primary) hypertension: Secondary | ICD-10-CM

## 2015-10-26 DIAGNOSIS — M810 Age-related osteoporosis without current pathological fracture: Secondary | ICD-10-CM

## 2015-10-26 DIAGNOSIS — K219 Gastro-esophageal reflux disease without esophagitis: Secondary | ICD-10-CM

## 2015-10-26 MED ORDER — ALENDRONATE SODIUM 70 MG PO TABS: 70.0000 mg | ORAL_TABLET | ORAL | Status: DC

## 2015-10-26 MED ORDER — ATORVASTATIN CALCIUM 10 MG PO TABS: 10.0000 mg | ORAL_TABLET | Freq: Every day | ORAL | Status: DC

## 2015-10-26 MED ORDER — LISINOPRIL-HYDROCHLOROTHIAZIDE 20-12.5 MG PO TABS: 0.5000 | ORAL_TABLET | Freq: Every day | ORAL | Status: DC

## 2015-10-26 MED ORDER — OMEPRAZOLE 20 MG PO CPDR: 20.0000 mg | DELAYED_RELEASE_CAPSULE | Freq: Every day | ORAL | Status: DC

## 2015-11-29 ENCOUNTER — Ambulatory Visit (INDEPENDENT_AMBULATORY_CARE_PROVIDER_SITE_OTHER): Payer: Medicare Other | Admitting: Nurse Practitioner

## 2015-11-29 ENCOUNTER — Encounter: Payer: Self-pay | Admitting: Nurse Practitioner

## 2015-11-29 VITALS — BP 111/76 | HR 94 | Temp 96.9°F | Ht 60.0 in | Wt 148.0 lb

## 2015-11-29 DIAGNOSIS — K219 Gastro-esophageal reflux disease without esophagitis: Secondary | ICD-10-CM | POA: Diagnosis not present

## 2015-11-29 DIAGNOSIS — E785 Hyperlipidemia, unspecified: Secondary | ICD-10-CM | POA: Diagnosis not present

## 2015-11-29 DIAGNOSIS — Z6829 Body mass index (BMI) 29.0-29.9, adult: Secondary | ICD-10-CM

## 2015-11-29 DIAGNOSIS — I10 Essential (primary) hypertension: Secondary | ICD-10-CM

## 2015-11-29 DIAGNOSIS — M810 Age-related osteoporosis without current pathological fracture: Secondary | ICD-10-CM

## 2015-11-29 LAB — CMP14+EGFR
ALBUMIN: 4.3 g/dL (ref 3.6–4.8)
ALK PHOS: 62 IU/L (ref 39–117)
ALT: 14 IU/L (ref 0–32)
AST: 13 IU/L (ref 0–40)
Albumin/Globulin Ratio: 1.7 (ref 1.2–2.2)
BILIRUBIN TOTAL: 0.4 mg/dL (ref 0.0–1.2)
BUN/Creatinine Ratio: 24 (ref 12–28)
BUN: 25 mg/dL (ref 8–27)
CHLORIDE: 100 mmol/L (ref 96–106)
CO2: 23 mmol/L (ref 18–29)
Calcium: 9.9 mg/dL (ref 8.7–10.3)
Creatinine, Ser: 1.03 mg/dL — ABNORMAL HIGH (ref 0.57–1.00)
GFR, EST AFRICAN AMERICAN: 64 mL/min/{1.73_m2} (ref >59)
GFR, EST NON AFRICAN AMERICAN: 56 mL/min/{1.73_m2} — AB (ref >59)
GLUCOSE: 95 mg/dL (ref 65–99)
Globulin, Total: 2.5 g/dL (ref 1.5–4.5)
POTASSIUM: 4.6 mmol/L (ref 3.5–5.2)
Sodium: 140 mmol/L (ref 134–144)
Total Protein: 6.8 g/dL (ref 6.0–8.5)

## 2015-11-29 LAB — LIPID PANEL
CHOL/HDL RATIO: 2.9 ratio (ref 0.0–4.4)
Cholesterol, Total: 173 mg/dL (ref 100–199)
HDL: 59 mg/dL (ref >39)
LDL Calculated: 80 mg/dL (ref 0–99)
TRIGLYCERIDES: 169 mg/dL — AB (ref 0–149)
VLDL CHOLESTEROL CAL: 34 mg/dL (ref 5–40)

## 2015-11-29 MED ORDER — LISINOPRIL-HYDROCHLOROTHIAZIDE 20-12.5 MG PO TABS: 0.5000 | ORAL_TABLET | Freq: Every day | ORAL | Status: DC

## 2015-11-29 MED ORDER — OMEPRAZOLE 20 MG PO CPDR: 20.0000 mg | DELAYED_RELEASE_CAPSULE | Freq: Every day | ORAL | Status: DC

## 2015-11-29 MED ORDER — FLUTICASONE PROPIONATE 50 MCG/ACT NA SUSP: 2.0000 | Freq: Every day | NASAL | Status: DC

## 2015-11-29 MED ORDER — ATORVASTATIN CALCIUM 10 MG PO TABS: 10.0000 mg | ORAL_TABLET | Freq: Every day | ORAL | Status: DC

## 2015-11-29 NOTE — Patient Instructions (Signed)
Fall Prevention in the Home  Falls can cause injuries and can affect people from all age groups. There are many simple things that you can do to make your home safe and to help prevent falls. WHAT CAN I DO ON THE OUTSIDE OF MY HOME?  Regularly repair the edges of walkways and driveways and fix any cracks.  Remove high doorway thresholds.  Trim any shrubbery on the main path into your home.  Use bright outdoor lighting.  Clear walkways of debris and clutter, including tools and rocks.  Regularly check that handrails are securely fastened and in good repair. Both sides of any steps should have handrails.  Install guardrails along the edges of any raised decks or porches.  Have leaves, snow, and ice cleared regularly.  Use sand or salt on walkways during winter months.  In the garage, clean up any spills right away, including grease or oil spills. WHAT CAN I DO IN THE BATHROOM?  Use night lights.  Install grab bars by the toilet and in the tub and shower. Do not use towel bars as grab bars.  Use non-skid mats or decals on the floor of the tub or shower.  If you need to sit down while you are in the shower, use a plastic, non-slip stool..  Keep the floor dry. Immediately clean up any water that spills on the floor.  Remove soap buildup in the tub or shower on a regular basis.  Attach bath mats securely with double-sided non-slip rug tape.  Remove throw rugs and other tripping hazards from the floor. WHAT CAN I DO IN THE BEDROOM?  Use night lights.  Make sure that a bedside light is easy to reach.  Do not use oversized bedding that drapes onto the floor.  Have a firm chair that has side arms to use for getting dressed.  Remove throw rugs and other tripping hazards from the floor. WHAT CAN I DO IN THE KITCHEN?   Clean up any spills right away.  Avoid walking on wet floors.  Place frequently used items in easy-to-reach places.  If you need to reach for something  above you, use a sturdy step stool that has a grab bar.  Keep electrical cables out of the way.  Do not use floor polish or wax that makes floors slippery. If you have to use wax, make sure that it is non-skid floor wax.  Remove throw rugs and other tripping hazards from the floor. WHAT CAN I DO IN THE STAIRWAYS?  Do not leave any items on the stairs.  Make sure that there are handrails on both sides of the stairs. Fix handrails that are broken or loose. Make sure that handrails are as long as the stairways.  Check any carpeting to make sure that it is firmly attached to the stairs. Fix any carpet that is loose or worn.  Avoid having throw rugs at the top or bottom of stairways, or secure the rugs with carpet tape to prevent them from moving.  Make sure that you have a light switch at the top of the stairs and the bottom of the stairs. If you do not have them, have them installed. WHAT ARE SOME OTHER FALL PREVENTION TIPS?  Wear closed-toe shoes that fit well and support your feet. Wear shoes that have rubber soles or low heels.  When you use a stepladder, make sure that it is completely opened and that the sides are firmly locked. Have someone hold the ladder while you   are using it. Do not climb a closed stepladder.  Add color or contrast paint or tape to grab bars and handrails in your home. Place contrasting color strips on the first and last steps.  Use mobility aids as needed, such as canes, walkers, scooters, and crutches.  Turn on lights if it is dark. Replace any light bulbs that burn out.  Set up furniture so that there are clear paths. Keep the furniture in the same spot.  Fix any uneven floor surfaces.  Choose a carpet design that does not hide the edge of steps of a stairway.  Be aware of any and all pets.  Review your medicines with your healthcare provider. Some medicines can cause dizziness or changes in blood pressure, which increase your risk of falling. Talk  with your health care provider about other ways that you can decrease your risk of falls. This may include working with a physical therapist or trainer to improve your strength, balance, and endurance.   This information is not intended to replace advice given to you by your health care provider. Make sure you discuss any questions you have with your health care provider.   Document Released: 04/20/2002 Document Revised: 09/14/2014 Document Reviewed: 06/04/2014 Elsevier Interactive Patient Education 2016 [employer]  

## 2015-11-29 NOTE — Progress Notes (Signed)
Subjective:    Patient ID: Cheryl Lee, female    DOB: Oct 17, 1945, 70 y.o.   MRN: [id number]  Patient here today for follow up of chronic medical problems.  Outpatient Encounter Prescriptions as of 11/29/2015  Medication Sig  . alendronate (FOSAMAX) 70 MG tablet Take 1 tablet (70 mg total) by mouth every 7 (seven) days. Take with a full glass of water on an empty stomach.  Marland Kitchen aspirin 81 MG tablet Take 81 mg by mouth daily.  Marland Kitchen atorvastatin (LIPITOR) 10 MG tablet Take 1 tablet (10 mg total) by mouth daily.  . fluticasone (FLONASE) 50 MCG/ACT nasal spray Place into both nostrils daily.  Marland Kitchen lisinopril-hydrochlorothiazide (PRINZIDE,ZESTORETIC) 20-12.5 MG tablet Take 0.5 tablets by mouth daily.  . Multiple Vitamins-Minerals (MULTIVITAMINS THER. W/MINERALS) TABS Take 1 tablet by mouth daily.    Marland Kitchen omeprazole (PRILOSEC) 20 MG capsule Take 1 capsule (20 mg total) by mouth daily.  . vitamin C (ASCORBIC ACID) 500 MG tablet Take 500 mg by mouth daily.     No facility-administered encounter medications on file as of 11/29/2015.    Hypertension This is a chronic problem. The current episode started more than 1 year ago. The problem is controlled. Pertinent negatives include no palpitations. Risk factors for coronary artery disease include dyslipidemia and post-menopausal state. Past treatments include ACE inhibitors and diuretics. The current treatment provides moderate improvement. Compliance problems include diet and exercise.   Hyperlipidemia This is a chronic problem. The current episode started more than 1 year ago. The problem is controlled. Recent lipid tests were reviewed and are variable. She has no history of diabetes, hypothyroidism or obesity. Pertinent negatives include no myalgias. Current antihyperlipidemic treatment includes statins. The current treatment provides moderate improvement of lipids. Compliance problems include adherence to diet and adherence to exercise.  Risk factors for  coronary artery disease include dyslipidemia, hypertension and post-menopausal.  OSTEOPOROSIS Currently on alendronate. Tolerating well. Last DEXA was 07/11/11 GERD Omeprazole works well to keep symptoms under control  Review of Systems  Constitutional: Negative.   HENT: Negative.   Respiratory: Negative.   Cardiovascular: Negative for palpitations.  Gastrointestinal: Negative.   Genitourinary: Negative.   Musculoskeletal: Negative for myalgias.  Neurological: Negative.   Psychiatric/Behavioral: Negative.   All other systems reviewed and are negative.      Objective:   Physical Exam  Constitutional: She is oriented to person, place, and time. She appears well-developed and well-nourished.  HENT:  Head: Normocephalic.  Right Ear: External ear normal.  Left Ear: External ear normal.  Mouth/Throat: Oropharynx is clear and moist.  Eyes: Pupils are equal, round, and reactive to light.  Neck: Normal range of motion. Neck supple. No JVD present.  Cardiovascular: Normal rate, regular rhythm, normal heart sounds and intact distal pulses.  Exam reveals no gallop and no friction rub.   No murmur heard. No peripheral edema bil  Pulmonary/Chest: Effort normal and breath sounds normal.  Abdominal: Soft. Bowel sounds are normal.  Musculoskeletal: Normal range of motion. She exhibits edema.  Compression hose in place    Neurological: She is alert and oriented to person, place, and time.  Skin: Skin is warm and dry.  Psychiatric: She has a normal mood and affect. Her behavior is normal. Judgment and thought content normal.  Vitals reviewed.  BP 111/76 mmHg  Pulse 94  Temp(Src) 96.9 F (36.1 C) (Oral)  Ht 5' (1.524 m)  Wt 148 lb (67.132 kg)  BMI 28.90 kg/m2  LMP 08/05/1991  Assessment & Plan:  1. Essential hypertension Do not add salt to diet - lisinopril-hydrochlorothiazide (PRINZIDE,ZESTORETIC) 20-12.5 MG tablet; Take 0.5 tablets by mouth daily.  Dispense: 45 tablet;  Refill: 1 - CMP14+EGFR  2. Gastroesophageal reflux disease without esophagitis Avoid spicy foods Do not eat 2 hours prior to bedtime - omeprazole (PRILOSEC) 20 MG capsule; Take 1 capsule (20 mg total) by mouth daily.  Dispense: 90 capsule; Refill: 1  3. Osteoporosis Weight bearing exercises  4. Hyperlipidemia with target LDL less than 100 Low fat diet - Lipid panel - atorvastatin (LIPITOR) 10 MG tablet; Take 1 tablet (10 mg total) by mouth daily.  Dispense: 90 tablet; Refill: 1  5. BMI 29.0-29.9,adult Discussed diet and exercise for person with BMI >25 Will recheck weight in 3-6 months      Labs pending Health maintenance reviewed Diet and exercise encouraged Continue all meds Follow up  In 6 months   [place], FNP

## 2015-12-02 ENCOUNTER — Telehealth: Payer: Self-pay | Admitting: *Deleted

## 2015-12-02 NOTE — Telephone Encounter (Signed)
-----   Message from [facility], [place] sent at 11/30/2015  4:55 PM EDT ----- Kidney and liver function stable Cholesterol looks good Continue current meds- low fat diet and exercise and recheck in 3 months

## 2015-12-02 NOTE — Telephone Encounter (Signed)
Pt notified of results Verbalizes understanding 

## 2016-01-04 ENCOUNTER — Encounter: Payer: Medicare Other | Admitting: *Deleted

## 2016-01-04 DIAGNOSIS — Z1231 Encounter for screening mammogram for malignant neoplasm of breast: Secondary | ICD-10-CM | POA: Diagnosis not present

## 2016-02-28 ENCOUNTER — Other Ambulatory Visit: Payer: Self-pay | Admitting: Nurse Practitioner

## 2016-02-28 DIAGNOSIS — K219 Gastro-esophageal reflux disease without esophagitis: Secondary | ICD-10-CM

## 2016-03-01 ENCOUNTER — Other Ambulatory Visit: Payer: Self-pay | Admitting: Nurse Practitioner

## 2016-03-01 MED ORDER — ALENDRONATE SODIUM 70 MG PO TABS: 70.0000 mg | ORAL_TABLET | ORAL | 1 refills | Status: DC

## 2016-03-01 NOTE — Telephone Encounter (Signed)
one

## 2016-06-01 ENCOUNTER — Ambulatory Visit (INDEPENDENT_AMBULATORY_CARE_PROVIDER_SITE_OTHER): Payer: Medicare Other | Admitting: Nurse Practitioner

## 2016-06-01 ENCOUNTER — Encounter (INDEPENDENT_AMBULATORY_CARE_PROVIDER_SITE_OTHER): Payer: Self-pay

## 2016-06-01 ENCOUNTER — Encounter: Payer: Self-pay | Admitting: Nurse Practitioner

## 2016-06-01 VITALS — BP 122/81 | HR 96 | Temp 97.1°F | Ht 60.0 in | Wt 148.0 lb

## 2016-06-01 DIAGNOSIS — M810 Age-related osteoporosis without current pathological fracture: Secondary | ICD-10-CM | POA: Diagnosis not present

## 2016-06-01 DIAGNOSIS — E785 Hyperlipidemia, unspecified: Secondary | ICD-10-CM

## 2016-06-01 DIAGNOSIS — I10 Essential (primary) hypertension: Secondary | ICD-10-CM

## 2016-06-01 DIAGNOSIS — Z6828 Body mass index (BMI) 28.0-28.9, adult: Secondary | ICD-10-CM

## 2016-06-01 DIAGNOSIS — K219 Gastro-esophageal reflux disease without esophagitis: Secondary | ICD-10-CM | POA: Diagnosis not present

## 2016-06-01 MED ORDER — ATORVASTATIN CALCIUM 10 MG PO TABS: 10.0000 mg | ORAL_TABLET | Freq: Every day | ORAL | 1 refills | Status: DC

## 2016-06-01 MED ORDER — ALENDRONATE SODIUM 70 MG PO TABS: 70.0000 mg | ORAL_TABLET | ORAL | 1 refills | Status: DC

## 2016-06-01 MED ORDER — LISINOPRIL-HYDROCHLOROTHIAZIDE 20-12.5 MG PO TABS: 0.5000 | ORAL_TABLET | Freq: Every day | ORAL | 1 refills | Status: DC

## 2016-06-01 NOTE — Patient Instructions (Signed)
Bone Health Introduction Bones protect organs, store calcium, and anchor muscles. Good health habits, such as eating nutritious foods and exercising regularly, are important for maintaining healthy bones. They can also help to prevent a condition that causes bones to lose density and become weak and brittle (osteoporosis). Why is bone mass important? Bone mass refers to the amount of bone tissue that you have. The higher your bone mass, the stronger your bones. An important step toward having healthy bones throughout life is to have strong and dense bones during childhood. A young adult who has a high bone mass is more likely to have a high bone mass later in life. Bone mass at its greatest it is called peak bone mass. A large decline in bone mass occurs in older adults. In women, it occurs about the time of menopause. During this time, it is important to practice good health habits, because if more bone is lost than what is replaced, the bones will become less healthy and more likely to break (fracture). If you find that you have a low bone mass, you may be able to prevent osteoporosis or further bone loss by changing your diet and lifestyle. How can I find out if my bone mass is low? Bone mass can be measured with an X-ray test that is called a bone mineral density (BMD) test. This test is recommended for all women who are age 65 or older. It may also be recommended for men who are age 70 or older, or for people who are more likely to develop osteoporosis due to:  Having bones that break easily.  Having a long-term disease that weakens bones, such as kidney disease or rheumatoid arthritis.  Having menopause earlier than normal.  Taking medicine that weakens bones, such as steroids, thyroid hormones, or hormone treatment for breast cancer or prostate cancer.  Smoking.  Drinking three or more alcoholic drinks each day. What are the nutritional recommendations for healthy bones? To have healthy  bones, you need to get enough of the right minerals and vitamins. Most nutrition experts recommend getting these nutrients from the foods that you eat. Nutritional recommendations vary from person to person. Ask your health care provider what is healthy for you. Here are some general guidelines. Calcium Recommendations  Calcium is the most important (essential) mineral for bone health. Most people can get enough calcium from their diet, but supplements may be recommended for people who are at risk for osteoporosis. Good sources of calcium include:  Dairy products, such as low-fat or nonfat milk, cheese, and yogurt.  Dark green leafy vegetables, such as bok choy and broccoli.  Calcium-fortified foods, such as orange juice, cereal, bread, soy beverages, and tofu products.  Nuts, such as almonds. Follow these recommended amounts for daily calcium intake:  Children, age 1?3: 700 mg.  Children, age 4?8: 1,000 mg.  Children, age 9?13: 1,300 mg.  Teens, age 14?18: 1,300 mg.  Adults, age 19?50: 1,000 mg.  Adults, age 51?70:  Men: 1,000 mg.  Women: 1,200 mg.  Adults, age 71 or older: 1,200 mg.  Pregnant and breastfeeding females:  Teens: 1,300 mg.  Adults: 1,000 mg. Vitamin D Recommendations  Vitamin D is the most essential vitamin for bone health. It helps the body to absorb calcium. Sunlight stimulates the skin to make vitamin D, so be sure to get enough sunlight. If you live in a cold climate or you do not get outside often, your health care provider may recommend that you take vitamin   D supplements. Good sources of vitamin D in your diet include:  Egg yolks.  Saltwater fish.  Milk and cereal fortified with vitamin D. Follow these recommended amounts for daily vitamin D intake:  Children and teens, age 1?18: 600 international units.  Adults, age 50 or younger: 400-800 international units.  Adults, age 51 or older: 800-1,000 international units. Other Nutrients  Other  nutrients for bone health include:  Phosphorus. This mineral is found in meat, poultry, dairy foods, nuts, and legumes. The recommended daily intake for adult men and adult women is 700 mg.  Magnesium. This mineral is found in seeds, nuts, dark green vegetables, and legumes. The recommended daily intake for adult men is 400?420 mg. For adult women, it is 310?320 mg.  Vitamin K. This vitamin is found in green leafy vegetables. The recommended daily intake is 120 mg for adult men and 90 mg for adult women. What type of physical activity is best for building and maintaining healthy bones? Weight-bearing and strength-building activities are important for building and maintaining peak bone mass. Weight-bearing activities cause muscles and bones to work against gravity. Strength-building activities increases muscle strength that supports bones. Weight-bearing and muscle-building activities include:  Walking and hiking.  Jogging and running.  Dancing.  Gym exercises.  Lifting weights.  Tennis and racquetball.  Climbing stairs.  Aerobics. Adults should get at least 30 minutes of moderate physical activity on most days. Children should get at least 60 minutes of moderate physical activity on most days. Ask your health care provide what type of exercise is best for you. Where can I find more information? For more information, check out the following websites:  National Osteoporosis Foundation: http://nof.org/learn/basics  National Institutes of Health: http://www.niams.nih.gov/Health_Info/Bone/Bone_Health/bone_health_for_life.asp This information is not intended to replace advice given to you by your health care provider. Make sure you discuss any questions you have with your health care provider. Document Released: 07/21/2003 Document Revised: 11/18/2015 Document Reviewed: 05/05/2014  2017 Elsevier  

## 2016-06-01 NOTE — Progress Notes (Signed)
Subjective:    Patient ID: Cheryl Lee, female    DOB: 07-26-1945, 71 y.o.   MRN: [id number]  Patient here today for follow up of chronic medical problems. NO changes were made at last visit.  Outpatient Encounter Prescriptions as of 06/01/2016  Medication Sig  . alendronate (FOSAMAX) 70 MG tablet Take 1 tablet (70 mg total) by mouth every 7 (seven) days. Take with a full glass of water on an empty stomach.  Marland Kitchen aspirin 81 MG tablet Take 81 mg by mouth daily.  Marland Kitchen atorvastatin (LIPITOR) 10 MG tablet Take 1 tablet (10 mg total) by mouth daily.  . fluticasone (FLONASE) 50 MCG/ACT nasal spray Place 2 sprays into both nostrils daily.  Marland Kitchen lisinopril-hydrochlorothiazide (PRINZIDE,ZESTORETIC) 20-12.5 MG tablet Take 0.5 tablets by mouth daily.  . Multiple Vitamins-Minerals (MULTIVITAMINS THER. W/MINERALS) TABS Take 1 tablet by mouth daily.    Marland Kitchen omeprazole (PRILOSEC) 20 MG capsule TAKE 1 CAPSULE EVERY DAY  . vitamin C (ASCORBIC ACID) 500 MG tablet Take 500 mg by mouth daily.     No facility-administered encounter medications on file as of 06/01/2016.     Hypertension  This is a chronic problem. The current episode started more than 1 year ago. The problem is controlled. Pertinent negatives include no palpitations. Risk factors for coronary artery disease include dyslipidemia and post-menopausal state. Past treatments include ACE inhibitors and diuretics. The current treatment provides moderate improvement. Compliance problems include diet and exercise.   Hyperlipidemia  This is a chronic problem. The current episode started more than 1 year ago. The problem is controlled. Recent lipid tests were reviewed and are variable. She has no history of diabetes, hypothyroidism or obesity. Pertinent negatives include no myalgias. Current antihyperlipidemic treatment includes statins. The current treatment provides moderate improvement of lipids. Compliance problems include adherence to diet and adherence to  exercise.  Risk factors for coronary artery disease include dyslipidemia, hypertension and post-menopausal.  OSTEOPOROSIS Currently on alendronate. Tolerating well. Last DEXA was 07/11/11 GERD Omeprazole works well to keep symptoms under control  Review of Systems  Constitutional: Negative.   HENT: Negative.   Respiratory: Negative.   Cardiovascular: Negative for palpitations.  Gastrointestinal: Negative.   Genitourinary: Negative.   Musculoskeletal: Negative for myalgias.  Neurological: Negative.   Psychiatric/Behavioral: Negative.   All other systems reviewed and are negative.      Objective:   Physical Exam  Constitutional: She is oriented to person, place, and time. She appears well-developed and well-nourished.  HENT:  Head: Normocephalic.  Right Ear: External ear normal.  Left Ear: External ear normal.  Mouth/Throat: Oropharynx is clear and moist.  Eyes: Pupils are equal, round, and reactive to light.  Neck: Normal range of motion. Neck supple. No JVD present.  Cardiovascular: Normal rate, regular rhythm, normal heart sounds and intact distal pulses.  Exam reveals no gallop and no friction rub.   No murmur heard. No peripheral edema bil  Pulmonary/Chest: Effort normal and breath sounds normal.  Abdominal: Soft. Bowel sounds are normal.  Musculoskeletal: Normal range of motion. She exhibits edema.  Compression hose in place    Neurological: She is alert and oriented to person, place, and time.  Skin: Skin is warm and dry.  Psychiatric: She has a normal mood and affect. Her behavior is normal. Judgment and thought content normal.  Vitals reviewed.  LMP 08/05/1991   BP 122/81   Pulse 96   Temp 97.1 F (36.2 C) (Oral)   Ht 5' (1.524 m)   [facility]  148 lb (67.1 kg)   LMP 08/05/1991   BMI 28.90 kg/m        Assessment & Plan:  1. Essential hypertension Low sodium diet - lisinopril-hydrochlorothiazide (PRINZIDE,ZESTORETIC) 20-12.5 MG tablet; Take 0.5 tablets by  mouth daily.  Dispense: 45 tablet; Refill: 1 - CMP14+EGFR  2. Gastroesophageal reflux disease without esophagitis Avoid spicy foods Do not eat 2 hours prior to bedtime  3. Hyperlipidemia with target LDL less than 100 Low fat diet - atorvastatin (LIPITOR) 10 MG tablet; Take 1 tablet (10 mg total) by mouth daily.  Dispense: 90 tablet; Refill: 1 - Lipid panel  4. Age-related osteoporosis without current pathological fracture Weight bearing exercises as can tolerate  5. BMI 28.0-28.9,adult Discussed diet and exercise for person with BMI >25 Will recheck weight in 3-6 months    Labs pending Health maintenance reviewed Diet and exercise encouraged Continue all meds Follow up  In 6 months   [place], FNP

## 2016-06-02 LAB — CMP14+EGFR
A/G RATIO: 1.8 (ref 1.2–2.2)
ALT: 14 IU/L (ref 0–32)
AST: 16 IU/L (ref 0–40)
Albumin: 4.4 g/dL (ref 3.5–4.8)
Alkaline Phosphatase: 64 IU/L (ref 39–117)
BUN/Creatinine Ratio: 15 (ref 12–28)
BUN: 15 mg/dL (ref 8–27)
Bilirubin Total: 0.6 mg/dL (ref 0.0–1.2)
CALCIUM: 10.2 mg/dL (ref 8.7–10.3)
CO2: 25 mmol/L (ref 18–29)
Chloride: 99 mmol/L (ref 96–106)
Creatinine, Ser: 1 mg/dL (ref 0.57–1.00)
GFR, EST AFRICAN AMERICAN: 66 mL/min/{1.73_m2} (ref >59)
GFR, EST NON AFRICAN AMERICAN: 57 mL/min/{1.73_m2} — AB (ref >59)
Globulin, Total: 2.5 g/dL (ref 1.5–4.5)
Glucose: 95 mg/dL (ref 65–99)
POTASSIUM: 4.4 mmol/L (ref 3.5–5.2)
Sodium: 141 mmol/L (ref 134–144)
TOTAL PROTEIN: 6.9 g/dL (ref 6.0–8.5)

## 2016-06-02 LAB — LIPID PANEL
CHOL/HDL RATIO: 2.7 ratio (ref 0.0–4.4)
Cholesterol, Total: 164 mg/dL (ref 100–199)
HDL: 60 mg/dL (ref >39)
LDL Calculated: 75 mg/dL (ref 0–99)
Triglycerides: 143 mg/dL (ref 0–149)
VLDL CHOLESTEROL CAL: 29 mg/dL (ref 5–40)

## 2016-07-03 ENCOUNTER — Other Ambulatory Visit: Payer: Medicare Other

## 2016-07-03 DIAGNOSIS — Z1212 Encounter for screening for malignant neoplasm of rectum: Secondary | ICD-10-CM | POA: Diagnosis not present

## 2016-07-06 LAB — FECAL OCCULT BLOOD, IMMUNOCHEMICAL: FECAL OCCULT BLD: POSITIVE — AB

## 2016-07-13 ENCOUNTER — Other Ambulatory Visit (INDEPENDENT_AMBULATORY_CARE_PROVIDER_SITE_OTHER): Payer: Medicare Other

## 2016-07-13 DIAGNOSIS — Z1212 Encounter for screening for malignant neoplasm of rectum: Secondary | ICD-10-CM

## 2016-07-15 LAB — FECAL OCCULT BLOOD, IMMUNOCHEMICAL: FECAL OCCULT BLD: NEGATIVE

## 2016-10-12 ENCOUNTER — Other Ambulatory Visit: Payer: Self-pay

## 2016-10-12 DIAGNOSIS — K219 Gastro-esophageal reflux disease without esophagitis: Secondary | ICD-10-CM

## 2016-10-12 MED ORDER — FLUTICASONE PROPIONATE 50 MCG/ACT NA SUSP
2.0000 | Freq: Every day | NASAL | 1 refills | Status: DC
Start: 2016-10-12 — End: 2016-10-17

## 2016-10-12 MED ORDER — OMEPRAZOLE 20 MG PO CPDR: 20.0000 mg | DELAYED_RELEASE_CAPSULE | Freq: Every day | ORAL | 1 refills | Status: DC

## 2016-10-17 ENCOUNTER — Other Ambulatory Visit: Payer: Self-pay | Admitting: *Deleted

## 2016-10-17 MED ORDER — FLUTICASONE PROPIONATE 50 MCG/ACT NA SUSP: 2.0000 | Freq: Every day | NASAL | 1 refills | Status: DC

## 2016-11-29 ENCOUNTER — Encounter: Payer: Self-pay | Admitting: Nurse Practitioner

## 2016-11-29 ENCOUNTER — Ambulatory Visit (INDEPENDENT_AMBULATORY_CARE_PROVIDER_SITE_OTHER): Payer: Medicare Other | Admitting: Nurse Practitioner

## 2016-11-29 VITALS — BP 128/76 | HR 80 | Temp 97.0°F | Ht 60.0 in | Wt 141.0 lb

## 2016-11-29 DIAGNOSIS — E785 Hyperlipidemia, unspecified: Secondary | ICD-10-CM | POA: Diagnosis not present

## 2016-11-29 DIAGNOSIS — Z6829 Body mass index (BMI) 29.0-29.9, adult: Secondary | ICD-10-CM | POA: Diagnosis not present

## 2016-11-29 DIAGNOSIS — I10 Essential (primary) hypertension: Secondary | ICD-10-CM | POA: Diagnosis not present

## 2016-11-29 DIAGNOSIS — K219 Gastro-esophageal reflux disease without esophagitis: Secondary | ICD-10-CM | POA: Diagnosis not present

## 2016-11-29 DIAGNOSIS — M810 Age-related osteoporosis without current pathological fracture: Secondary | ICD-10-CM | POA: Diagnosis not present

## 2016-11-29 MED ORDER — ATORVASTATIN CALCIUM 10 MG PO TABS: 10.0000 mg | ORAL_TABLET | Freq: Every day | ORAL | 1 refills | Status: DC

## 2016-11-29 MED ORDER — ALENDRONATE SODIUM 70 MG PO TABS: 70.0000 mg | ORAL_TABLET | ORAL | 1 refills | Status: DC

## 2016-11-29 MED ORDER — OMEPRAZOLE 20 MG PO CPDR: 20.0000 mg | DELAYED_RELEASE_CAPSULE | Freq: Every day | ORAL | 1 refills | Status: DC

## 2016-11-29 MED ORDER — LISINOPRIL-HYDROCHLOROTHIAZIDE 20-12.5 MG PO TABS: 0.5000 | ORAL_TABLET | Freq: Every day | ORAL | 1 refills | Status: DC

## 2016-11-29 NOTE — Patient Instructions (Signed)

## 2016-11-29 NOTE — Addendum Note (Signed)
Addended by: Chevis Pretty on: 11/29/2016 08:51 AM   Modules accepted: Orders

## 2016-11-29 NOTE — Progress Notes (Signed)
Subjective:    Patient ID: Cheryl Lee, female    DOB: 07-03-1945, 71 y.o.   MRN: [id number]  HPI ZETTIE GOOTEE is here today for follow up of chronic medical problem.  Outpatient Encounter Prescriptions as of 11/29/2016  Medication Sig  . alendronate (FOSAMAX) 70 MG tablet Take 1 tablet (70 mg total) by mouth every 7 (seven) days. Take with a full glass of water on an empty stomach.  Marland Kitchen aspirin 81 MG tablet Take 81 mg by mouth daily.  Marland Kitchen atorvastatin (LIPITOR) 10 MG tablet Take 1 tablet (10 mg total) by mouth daily.  . fluticasone (FLONASE) 50 MCG/ACT nasal spray Place 2 sprays into both nostrils daily.  Marland Kitchen lisinopril-hydrochlorothiazide (PRINZIDE,ZESTORETIC) 20-12.5 MG tablet Take 0.5 tablets by mouth daily.  . Multiple Vitamins-Minerals (MULTIVITAMINS THER. W/MINERALS) TABS Take 1 tablet by mouth daily.    Marland Kitchen omeprazole (PRILOSEC) 20 MG capsule Take 1 capsule (20 mg total) by mouth daily.  . vitamin C (ASCORBIC ACID) 500 MG tablet Take 500 mg by mouth daily.     No facility-administered encounter medications on file as of 11/29/2016.     1. Essential hypertension  Patient taking combination lisinopril-HCTZ for blood pressure management.  Patient checks blood pressure occasionally and it is usually no higher than 120s/70s.  2. Gastroesophageal reflux disease without esophagitis  Symptoms managed with omeprazole.  No complaints at this time.  3. Age-related osteoporosis without current pathological fracture Patient taking alendronate for symptoms management.     4. Hyperlipidemia with target LDL less than 100  Managed with atorvastatin and low fat diet.  5. BMI 29.0-29.9,adult  No recent weight gain or loss.    New complaints: None today.  Social history: Lives by herself. Her brother Servando Salina lives next door and helps her a lot . Her daughters husband is an alcoholic so she does not have a lot of time to help her.   Review of Systems  Constitutional: Negative for  activity change, appetite change and fatigue.  Respiratory: Negative for shortness of breath and wheezing.   Cardiovascular: Negative for chest pain and palpitations.  Gastrointestinal: Negative for abdominal distention and abdominal pain.  Neurological: Negative for dizziness, light-headedness and headaches.  All other systems reviewed and are negative.      Objective:   Physical Exam  Constitutional: She is oriented to person, place, and time. She appears well-developed and well-nourished. No distress.  HENT:  Head: Normocephalic.  Right Ear: External ear normal.  Left Ear: External ear normal.  Mouth/Throat: Oropharynx is clear and moist.  Eyes: Pupils are equal, round, and reactive to light.  Neck: Normal range of motion. Neck supple. No JVD present. No thyromegaly present.  Cardiovascular: Normal rate, regular rhythm, normal heart sounds and intact distal pulses.   No murmur heard. Pulmonary/Chest: Effort normal and breath sounds normal. No respiratory distress. She has no wheezes.  Abdominal: Soft. Bowel sounds are normal. She exhibits no distension. There is no tenderness.  Musculoskeletal: Normal range of motion. She exhibits no edema.  Lymphadenopathy:    She has no cervical adenopathy.  Neurological: She is alert and oriented to person, place, and time.  Skin: Skin is warm and dry.  Psychiatric: She has a normal mood and affect. Her behavior is normal. Judgment and thought content normal.   BP 128/76   Pulse 80   Temp (!) 97 F (36.1 C) (Oral)   Ht 5' (1.524 m)   Wt 141 lb (64 kg)  LMP 08/05/1991   BMI 27.54 kg/m       Assessment & Plan:  1. Essential hypertension Low sodium diet - lisinopril-hydrochlorothiazide (PRINZIDE,ZESTORETIC) 20-12.5 MG tablet; Take 0.5 tablets by mouth daily.  Dispense: 45 tablet; Refill: 1 - CMP14+EGFR  2. Gastroesophageal reflux disease without esophagitis Avoid spicy foods Do not eat 2 hours prior to bedtime - omeprazole  (PRILOSEC) 20 MG capsule; Take 1 capsule (20 mg total) by mouth daily.  Dispense: 90 capsule; Refill: 1  3. Age-related osteoporosis without current pathological fracture Weight bearing exercises - alendronate (FOSAMAX) 70 MG tablet; Take 1 tablet (70 mg total) by mouth every 7 (seven) days. Take with a full glass of water on an empty stomach.  Dispense: 12 tablet; Refill: 1  4. Hyperlipidemia with target LDL less than 100 Low fat diet - atorvastatin (LIPITOR) 10 MG tablet; Take 1 tablet (10 mg total) by mouth daily.  Dispense: 90 tablet; Refill: 1 - Lipid panel  5. BMI 29.0-29.9,adult Discussed diet and exercise for person with BMI >25 Will recheck weight in 3-6 months     Labs pending Health maintenance reviewed Diet and exercise encouraged Continue all meds Follow up  In 6 months   [place], FNP

## 2016-11-30 LAB — CMP14+EGFR
ALT: 15 IU/L (ref 0–32)
AST: 17 IU/L (ref 0–40)
Albumin/Globulin Ratio: 1.8 (ref 1.2–2.2)
Albumin: 4.3 g/dL (ref 3.5–4.8)
Alkaline Phosphatase: 61 IU/L (ref 39–117)
BUN/Creatinine Ratio: 23 (ref 12–28)
BUN: 22 mg/dL (ref 8–27)
Bilirubin Total: 0.5 mg/dL (ref 0.0–1.2)
CALCIUM: 9.9 mg/dL (ref 8.7–10.3)
CO2: 23 mmol/L (ref 20–29)
CREATININE: 0.97 mg/dL (ref 0.57–1.00)
Chloride: 100 mmol/L (ref 96–106)
GFR calc Af Amer: 68 mL/min/{1.73_m2} (ref >59)
GFR, EST NON AFRICAN AMERICAN: 59 mL/min/{1.73_m2} — AB (ref >59)
GLUCOSE: 88 mg/dL (ref 65–99)
Globulin, Total: 2.4 g/dL (ref 1.5–4.5)
Potassium: 4.5 mmol/L (ref 3.5–5.2)
Sodium: 137 mmol/L (ref 134–144)
TOTAL PROTEIN: 6.7 g/dL (ref 6.0–8.5)

## 2016-11-30 LAB — LIPID PANEL
Chol/HDL Ratio: 3 ratio (ref 0.0–4.4)
Cholesterol, Total: 166 mg/dL (ref 100–199)
HDL: 55 mg/dL (ref >39)
LDL CALC: 76 mg/dL (ref 0–99)
Triglycerides: 174 mg/dL — ABNORMAL HIGH (ref 0–149)
VLDL Cholesterol Cal: 35 mg/dL (ref 5–40)

## 2016-11-30 LAB — PLEASE NOTE

## 2016-12-28 ENCOUNTER — Encounter: Payer: Self-pay | Admitting: Nurse Practitioner

## 2016-12-28 ENCOUNTER — Ambulatory Visit (INDEPENDENT_AMBULATORY_CARE_PROVIDER_SITE_OTHER): Payer: Medicare Other | Admitting: Nurse Practitioner

## 2016-12-28 VITALS — BP 127/89 | HR 97 | Temp 97.3°F | Ht 60.0 in | Wt 141.0 lb

## 2016-12-28 DIAGNOSIS — M79652 Pain in left thigh: Secondary | ICD-10-CM | POA: Diagnosis not present

## 2016-12-28 MED ORDER — METHYLPREDNISOLONE ACETATE 80 MG/ML IJ SUSP
80.0000 mg | Freq: Once | INTRAMUSCULAR | Status: AC
  Administered 2016-12-28: 80 mg via INTRAMUSCULAR

## 2016-12-28 NOTE — Progress Notes (Signed)
   Subjective:    Patient ID: Cheryl Lee, female    DOB: 12-13-1945, 71 y.o.   MRN: [id number]  HPI Patient come sin today c/o left anterior thigh pain. Started 1 week ago. Says pain comes and goes. Rates 2-3 /10. Has used ibuprofen and aspercreme on it shich helps some. Sitting or laying makes area worse. 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    Review of Systems  Constitutional: Negative.   Respiratory: Negative.   Cardiovascular: Negative.   Genitourinary: Negative.   Musculoskeletal: Positive for myalgias.  Neurological: Negative.   Psychiatric/Behavioral: Negative.   All other systems reviewed and are negative.      Objective:   Physical Exam  Constitutional: She is oriented to person, place, and time. She appears well-developed and well-nourished. No distress.  Cardiovascular: Normal rate.   Pulmonary/Chest: Effort normal and breath sounds normal.  Musculoskeletal:  Mild left anterior thigh pain on palpation.  No edema, erythema or bug bites noted  Neurological: She is alert and oriented to person, place, and time.  Skin: Skin is warm.  Psychiatric: She has a normal mood and affect. Her behavior is normal. Judgment and thought content normal.   BP 127/89   Pulse 97   Temp (!) 97.3 F (36.3 C) (Oral)   Ht 5' (1.524 m)   Wt 141 lb (64 kg)   LMP 08/05/1991   BMI 27.54 kg/m      Assessment & Plan:  1. Pain of left thigh Moist heat No stooping or squating for 3 days RTO prn - methylPREDNISolone acetate (DEPO-MEDROL) injection 80 mg; Inject 1 mL (80 mg total) into the muscle once.   Mary-Margaret Hassell Done, FNP

## 2016-12-31 ENCOUNTER — Encounter: Payer: Self-pay | Admitting: Nurse Practitioner

## 2016-12-31 ENCOUNTER — Other Ambulatory Visit: Payer: Self-pay

## 2016-12-31 ENCOUNTER — Ambulatory Visit (INDEPENDENT_AMBULATORY_CARE_PROVIDER_SITE_OTHER): Payer: Medicare Other | Admitting: Nurse Practitioner

## 2016-12-31 VITALS — BP 129/79 | HR 82 | Temp 98.2°F | Ht 60.0 in | Wt 141.0 lb

## 2016-12-31 DIAGNOSIS — M79652 Pain in left thigh: Secondary | ICD-10-CM

## 2016-12-31 MED ORDER — DICLOFENAC SODIUM 1 % TD GEL: 2.0000 g | Freq: Four times a day (QID) | TRANSDERMAL | 1 refills | Status: DC

## 2016-12-31 MED ORDER — MELOXICAM 15 MG PO TABS: 15.0000 mg | ORAL_TABLET | Freq: Every day | ORAL | 0 refills | Status: DC

## 2016-12-31 NOTE — Progress Notes (Signed)
   Subjective:    Patient ID: Cheryl Lee, female    DOB: [birth year], 71 y.o.   MRN: [id number]  HPI Patient comes in today c/o left leg pain. SHe was seen on 12/28/16 with same complaint. She was given a depomedrol shot. She says that it did not help at all. She works at [employer] and it really hurts her leg to be up walking.    Review of Systems  Constitutional: Negative.   Respiratory: Negative.   Cardiovascular: Negative.  Negative for leg swelling.  Musculoskeletal: Positive for myalgias.  Neurological: Negative.   Psychiatric/Behavioral: Negative.   All other systems reviewed and are negative.      Objective:   Physical Exam  Constitutional: She is oriented to person, place, and time. She appears well-developed and well-nourished. No distress.  Cardiovascular: Normal rate.   Pulmonary/Chest: Effort normal.  Musculoskeletal: Normal range of motion.  FROM of left hip without pain. Mild tenderness on palpation of thigh.  Neurological: She is alert and oriented to person, place, and time.  Skin: Skin is warm.  Psychiatric: She has a normal mood and affect. Her behavior is normal. Judgment and thought content normal.    BP 129/79   Pulse 82   Temp 98.2 F (36.8 C) (Oral)   Ht 5' (1.524 m)   Wt 141 lb (64 kg)   LMP 08/05/1991   BMI 27.54 kg/m        Assessment & Plan:   1. Left thigh pain    Meds ordered this encounter  Medications  . meloxicam (MOBIC) 15 MG tablet    Sig: Take 1 tablet (15 mg total) by mouth daily.    Dispense:  30 tablet    Refill:  0    Order Specific Question:   Supervising Provider    Answer:   VINCENT, CAROL L [[id number]]  . diclofenac sodium (VOLTAREN) 1 % GEL    Sig: Apply 2 g topically 4 (four) times daily.    Dispense:  5 Tube    Refill:  1    Order Specific Question:   Supervising Provider    Answer:   Eustaquio Maize [[id number]]   Rest No stooping or squatting RTO prn  Mary-Margaret Hassell Done, FNP

## 2017-01-03 ENCOUNTER — Telehealth: Payer: Self-pay | Admitting: Nurse Practitioner

## 2017-01-03 DIAGNOSIS — M25562 Pain in left knee: Secondary | ICD-10-CM | POA: Diagnosis not present

## 2017-01-03 DIAGNOSIS — M79662 Pain in left lower leg: Secondary | ICD-10-CM | POA: Diagnosis not present

## 2017-01-03 NOTE — Telephone Encounter (Signed)
Pt states the muscle in her leg is just getting weak and it is hurting from top to bottom. She feels like she needs something more because medicine isn't working and she can't work like this.

## 2017-01-03 NOTE — Telephone Encounter (Signed)
Needs to see orthi- where would she like to go

## 2017-01-26 NOTE — Telephone Encounter (Signed)
Detailed message left for patient to please call back if she is still having trouble with her legs. Several attempts have been made to contact patient.

## 2017-01-27 ENCOUNTER — Other Ambulatory Visit: Payer: Self-pay | Admitting: Nurse Practitioner

## 2017-01-27 DIAGNOSIS — M79652 Pain in left thigh: Secondary | ICD-10-CM

## 2017-01-29 ENCOUNTER — Telehealth: Payer: Self-pay | Admitting: Nurse Practitioner

## 2017-02-11 ENCOUNTER — Ambulatory Visit (INDEPENDENT_AMBULATORY_CARE_PROVIDER_SITE_OTHER): Payer: Medicare Other | Admitting: Family Medicine

## 2017-02-11 ENCOUNTER — Ambulatory Visit (INDEPENDENT_AMBULATORY_CARE_PROVIDER_SITE_OTHER): Payer: Medicare Other

## 2017-02-11 ENCOUNTER — Encounter: Payer: Self-pay | Admitting: Family Medicine

## 2017-02-11 VITALS — BP 153/92 | HR 86 | Temp 97.4°F | Ht 61.0 in | Wt 140.6 lb

## 2017-02-11 DIAGNOSIS — M5416 Radiculopathy, lumbar region: Secondary | ICD-10-CM

## 2017-02-11 NOTE — Progress Notes (Signed)
   HPI  Patient presents today for acute back pain.  Patient reports that she has presented to our clinic previously, on 817/2018 with left thigh pain that is similar to her previous pain. She states that Friday she developed left-sided low back pain it's extending to the left thigh in the similar distribution. She states that meloxicam does help it, however she was concerned about taking more of it.  She has since been seen in urgent care and her left knee was x-rayed. She also had ultrasound to evaluate her circulation she states. This was normal.  Patient has been treated with IM steroids, and then NSAIDs.  She was previously seen [facility] but requests to go to orthopedics in [place] if needed.  She states that the pain worsens with bending forward and then trying to straighten up after bending for long periods of time.  PMH: Smoking status noted ROS: Per HPI  Objective: BP (!) 153/92   Pulse 86   Temp (!) 97.4 F (36.3 C) (Oral)   Ht 5\' 1"  (1.549 m)   Wt 140 lb 9.6 oz (63.8 kg)   LMP 08/05/1991   BMI 26.57 kg/m  Gen: NAD, alert, cooperative with exam HEENT: NCAT CV: RRR, good S1/S2, no murmur Resp: CTABL, no wheezes, non-labored Ext: No edema, warm Neuro: Alert and oriented, No gross deficits MSK:  Tenderness to palpation of left-sided paraspinal muscles in the upper lumbar area, no midline tenderness Negative modified straight leg raise 2+ patellar tendon reflexes bilaterally   Assessment and plan:  # Acute lumbar radiculopathy Continue meloxicam as prescribed Refer to orthopedic surgery, patient would likely benefit from MRI and nerve root injections Discussed safety around NSAIDs at the age of 89 Return to clinic with any concerns     Orders Placed This Encounter  Procedures  . Ambulatory referral to Orthopedic Surgery    Referral Priority:   Routine    Referral Type:   Surgical    Referral Reason:   Specialty Services Required    Requested  Specialty:   Orthopedic Surgery    Number of Visits Requested:   [place], MD [facility] 02/11/2017, 10:26 AM

## 2017-02-11 NOTE — Patient Instructions (Signed)
Great to see you!  Continue meloxciam for now, we would lik eto have you off of it as soon as we can however.   I have referred you to orthopedics

## 2017-02-13 ENCOUNTER — Other Ambulatory Visit (INDEPENDENT_AMBULATORY_CARE_PROVIDER_SITE_OTHER): Payer: Self-pay | Admitting: Orthopaedic Surgery

## 2017-02-13 ENCOUNTER — Encounter (INDEPENDENT_AMBULATORY_CARE_PROVIDER_SITE_OTHER): Payer: Self-pay | Admitting: Orthopaedic Surgery

## 2017-02-13 ENCOUNTER — Ambulatory Visit (INDEPENDENT_AMBULATORY_CARE_PROVIDER_SITE_OTHER): Payer: Medicare Other | Admitting: Orthopaedic Surgery

## 2017-02-13 ENCOUNTER — Encounter (HOSPITAL_COMMUNITY): Payer: Self-pay

## 2017-02-13 ENCOUNTER — Emergency Department (HOSPITAL_COMMUNITY)
Admission: EM | Admit: 2017-02-13 | Discharge: 2017-02-14 | Disposition: A | Discharge status: Home / Self Care | Payer: Medicare Other | Source: Home / Self Care | Attending: Emergency Medicine | Admitting: Emergency Medicine

## 2017-02-13 VITALS — Temp 99.9°F | Resp 12 | Ht 60.0 in | Wt 140.0 lb

## 2017-02-13 DIAGNOSIS — M545 Low back pain: Secondary | ICD-10-CM | POA: Diagnosis not present

## 2017-02-13 DIAGNOSIS — M5442 Lumbago with sciatica, left side: Secondary | ICD-10-CM

## 2017-02-13 DIAGNOSIS — M79605 Pain in left leg: Secondary | ICD-10-CM

## 2017-02-13 DIAGNOSIS — M48061 Spinal stenosis, lumbar region without neurogenic claudication: Secondary | ICD-10-CM | POA: Diagnosis not present

## 2017-02-13 DIAGNOSIS — Z7982 Long term (current) use of aspirin: Secondary | ICD-10-CM

## 2017-02-13 DIAGNOSIS — Z79899 Other long term (current) drug therapy: Secondary | ICD-10-CM | POA: Insufficient documentation

## 2017-02-13 DIAGNOSIS — M5432 Sciatica, left side: Secondary | ICD-10-CM

## 2017-02-13 DIAGNOSIS — M5126 Other intervertebral disc displacement, lumbar region: Secondary | ICD-10-CM | POA: Diagnosis not present

## 2017-02-13 DIAGNOSIS — M5416 Radiculopathy, lumbar region: Secondary | ICD-10-CM

## 2017-02-13 DIAGNOSIS — X58XXXA Exposure to other specified factors, initial encounter: Secondary | ICD-10-CM | POA: Diagnosis not present

## 2017-02-13 DIAGNOSIS — I10 Essential (primary) hypertension: Secondary | ICD-10-CM | POA: Insufficient documentation

## 2017-02-13 DIAGNOSIS — M4316 Spondylolisthesis, lumbar region: Secondary | ICD-10-CM | POA: Diagnosis not present

## 2017-02-13 DIAGNOSIS — M5410 Radiculopathy, site unspecified: Secondary | ICD-10-CM

## 2017-02-13 DIAGNOSIS — M4856XA Collapsed vertebra, not elsewhere classified, lumbar region, initial encounter for fracture: Secondary | ICD-10-CM | POA: Diagnosis not present

## 2017-02-13 MED ORDER — METHOCARBAMOL 500 MG PO TABS
500.0000 mg | ORAL_TABLET | Freq: Once | ORAL | Status: AC
  Administered 2017-02-13: 500 mg via ORAL
  Filled 2017-02-13: qty 1

## 2017-02-13 MED ORDER — HYDROMORPHONE HCL 1 MG/ML IJ SOLN
0.5000 mg | Freq: Once | INTRAMUSCULAR | Status: AC
  Administered 2017-02-13: 0.5 mg via INTRAMUSCULAR
  Filled 2017-02-13: qty 1

## 2017-02-13 MED ORDER — ONDANSETRON HCL 4 MG PO TABS
4.0000 mg | ORAL_TABLET | Freq: Once | ORAL | Status: AC
  Administered 2017-02-13: 4 mg via ORAL
  Filled 2017-02-13: qty 1

## 2017-02-13 NOTE — Progress Notes (Signed)
Office Visit Note   Patient: Cheryl Lee           Date of Birth: 09/11/1945           MRN: [id number] Visit Date: 02/13/2017              Requested by: Chevis Pretty, [address] [place], [place] [id number] PCP: Chevis Pretty, FNP   Assessment & Plan: Visit Diagnoses:  1. Acute left-sided low back pain with left-sided sciatica     Plan: Long discussion with [name] and her daughter regarding the problem with her back and left lower extremity. I think it's worth obtaining an MRI scan. We'll schedule the scan and see her back next week. No work until reevaluated. There might be a small compression fracture of L4. She might have spinal stenosis creating claudication in her left lower extremity. Time spent nearly 45 minutes with the above discussion and plan  Follow-Up Instructions: Return after MRI L-S spine.   Orders:  No orders of the defined types were placed in this encounter.  No orders of the defined types were placed in this encounter.     Procedures: No procedures performed   Clinical Data: No additional findings.   Subjective: Chief Complaint  Patient presents with  . Lower Back - Weakness    Ms. [name] is a 71 y o that presents with chronic left sided low back pain, radiating into left thigh. Compression fx, L E Doppler negative, 1 month ago. Onset 9/28 , 1 wk prior she had been reaching, stretching, sweeping spider webs from a window  Mrs. [name] is accompanied by her daughter and here for evaluation of acute onset of low back pain associated with a more chronic problem with her left lower extremity. As his [place]'s had some recurrent episodes of pain in her left thigh associated with "cramping". She had a rather acute onset of low back pain this past Friday that she thinks may be related to the work that she performs at [employer]. Her work cleaning and some maintenance. She is constantly on her feet that bending stooping and squatting.  She does not re-Embry specific injury but developed pain to the point where she had difficulty standing without having to "bend my hip. Now she is having quite a bit of pain when she sleeps or when she walks. She is more comfortable when she sits but she certainly gets "stiff". She is having recurrent pain in her left thigh "as well". She denies any abdominal complaints or bowel or bladder dysfunction. She's not had any fever or chills. She also relates not having any numbness or tingling in either lower extremity. The pain in her left leg is predominantly in her thigh. No groin pain. She has does have some "achiness. Month ago she was seen at the [facility] in [place] and was told she had some arthritis in her left knee. They also performed a lower extremity Doppler without evidence of "blood clot or "poor circulation". Based on meloxicam and some "type of gel" notes been helpful. Recent x-rays performed within the last several days and she was told she might have a "compression fracture". I did review her films and the PACS system and there is suggestion that she may very minimal compression of L4 but without evidence of the listhesis. She also had some other degenerative changes.  HPI  Review of Systems  Constitutional: Positive for fatigue. Negative for chills and fever.  Eyes: Negative  for itching.  Respiratory: Negative for chest tightness and shortness of breath.   Cardiovascular: Negative for chest pain, palpitations and leg swelling.  Gastrointestinal: Negative for blood in stool, constipation and diarrhea.  Endocrine: Negative for polyuria.  Genitourinary: Negative for dysuria.  Musculoskeletal: Positive for back pain and joint swelling. Negative for neck pain and neck stiffness.  Allergic/Immunologic: Negative for immunocompromised state.  Neurological: Positive for weakness. Negative for dizziness and numbness.  Hematological: Does not bruise/bleed easily.    Psychiatric/Behavioral: The patient is not nervous/anxious.      Objective: Vital Signs: Temp 99.9 F (37.7 C)   Resp 12   Ht 5' (1.524 m)   Wt 140 lb (63.5 kg)   LMP 08/05/1991   BMI 27.34 kg/m   Physical Exam  Ortho Exam awake alert and oriented 3. Somewhat of a poor historian. Daughter was helpful. Evaluated in a wheelchair. Straight leg raise was negative for back pain but did have some "tightness" in both of her thighs. Neither knee was hot red or swollen. No instability. No swelling distally neurovascular exam appears to be intact. She did have some percussible tenderness of lumbar spine distally. No pain over the sacrum or either sacroiliac joint. Skin intact. Painless range of motion of both hips. No calf pain. No shortness of breath or chest pain  Specialty Comments:  No specialty comments available.  Imaging: No results found.   PMFS History: Patient Active Problem List   Diagnosis Date Noted  . BMI 29.0-29.9,adult 12/21/2014  . GERD (gastroesophageal reflux disease)   . Hyperlipidemia with target LDL less than 100   . Hypertension   . Osteoporosis    Past Medical History:  Diagnosis Date  . Acid reflux   . Hypercholesteremia   . Hypertension   . Osteoporosis   . Sinus congestion     Family History  Problem Relation Age of Onset  . Cancer Mother   . Alzheimer's disease Mother   . Heart disease Mother   . Early death Father   . Diabetes Sister   . Diabetes Sister   . Hypertension Sister     Past Surgical History:  Procedure Laterality Date  . CATARACT EXTRACTION    . Right snkle repair     Social History   Occupational History  . Not on file.   Social History Main Topics  . Smoking status: Never Smoker  . Smokeless tobacco: Never Used  . Alcohol use No  . Drug use: No  . Sexual activity: No

## 2017-02-13 NOTE — ED Notes (Signed)
Pt was unable to lay in bed so she moved to the chair with assistance.

## 2017-02-13 NOTE — ED Triage Notes (Signed)
Pt c/o pain in left lower back and left leg x 1 month.  Denies injury.  Pt saw her pcp Monday and had xray and was told she has a compression fracture and sent to ortho.  Ortho requested MRI because he wasn't sure it was a fracture.  Reports MRI is scheduled for Oct 13. Denies any urinary symptoms.

## 2017-02-14 ENCOUNTER — Ambulatory Visit (HOSPITAL_COMMUNITY)
Admission: RE | Admit: 2017-02-14 | Discharge: 2017-02-14 | Disposition: A | Discharge status: Home / Self Care | Payer: Medicare Other | Source: Ambulatory Visit | Attending: Physician Assistant | Admitting: Physician Assistant

## 2017-02-14 DIAGNOSIS — M4856XA Collapsed vertebra, not elsewhere classified, lumbar region, initial encounter for fracture: Secondary | ICD-10-CM | POA: Insufficient documentation

## 2017-02-14 DIAGNOSIS — M545 Low back pain: Secondary | ICD-10-CM | POA: Insufficient documentation

## 2017-02-14 DIAGNOSIS — M5126 Other intervertebral disc displacement, lumbar region: Secondary | ICD-10-CM | POA: Insufficient documentation

## 2017-02-14 DIAGNOSIS — M48061 Spinal stenosis, lumbar region without neurogenic claudication: Secondary | ICD-10-CM | POA: Insufficient documentation

## 2017-02-14 DIAGNOSIS — X58XXXA Exposure to other specified factors, initial encounter: Secondary | ICD-10-CM | POA: Insufficient documentation

## 2017-02-14 DIAGNOSIS — M4316 Spondylolisthesis, lumbar region: Secondary | ICD-10-CM | POA: Insufficient documentation

## 2017-02-14 MED ORDER — MELOXICAM 15 MG PO TABS: 15.0000 mg | ORAL_TABLET | Freq: Every day | ORAL | 0 refills | Status: DC

## 2017-02-14 MED ORDER — ONDANSETRON HCL 4 MG PO TABS: 4.0000 mg | ORAL_TABLET | Freq: Four times a day (QID) | ORAL | 0 refills | Status: DC

## 2017-02-14 MED ORDER — OXYCODONE-ACETAMINOPHEN 5-325 MG PO TABS: 1.0000 | ORAL_TABLET | Freq: Four times a day (QID) | ORAL | 0 refills | Status: DC | PRN

## 2017-02-14 MED ORDER — OXYCODONE-ACETAMINOPHEN 5-325 MG PO TABS
1.0000 | ORAL_TABLET | Freq: Once | ORAL | Status: AC
  Administered 2017-02-14: 1 via ORAL
  Filled 2017-02-14: qty 1

## 2017-02-14 NOTE — ED Provider Notes (Signed)
She returned for discussion of MRI results.  She is fairly comfortable sitting in a wheelchair.  Her daughter is here and also was involved with the discussion.  MRI does not show anything that will require urgent intervention.  She does have compression fracture, age-indeterminate, and a disc herniation, primarily left-sided L4-L5.  Both of these processes can be managed as an outpatient by her orthopedist, who she plans on seeing.  Family will call for an appointment with Dr. Durward Fortes, and she will continue her current prescribed medication.  Mr Lumbar Spine Wo Contrast  Result Date: 02/14/2017 CLINICAL DATA:  71 year old female with 1 week of lumbar back pain radiating to the left leg with no known injury. EXAM: MRI LUMBAR SPINE WITHOUT CONTRAST TECHNIQUE: Multiplanar, multisequence MR imaging of the lumbar spine was performed. No intravenous contrast was administered. COMPARISON:  Lumbar radiographs 02/11/2017. FINDINGS: Segmentation:  Normal as demonstrated on the comparison radiographs. Alignment: Mild levoconvex lumbar spine curvature. Straightening of lumbar lordosis. Mild anterolisthesis of L1 on L2 and retrolisthesis of L3 on L4. Vertebrae: Mild L4 vertebral body loss of height with inferior endplate deformity demonstrating mild to moderate inferior endplate marrow edema. 25-30% loss of vertebral body height. Mild retropulsion of the posterior inferior endplate. Marrow edema in the left L4 pedicle. The other posterior elements appear intact. Elsewhere Visualized bone marrow signal is within normal limits. No other marrow edema or acute osseous abnormality. Intact visible sacrum and SI joints. Conus medullaris: Extends to the T12-L1 level and appears normal. Paraspinal and other soft tissues: Mild edema within the medial left psoas muscle at the L4 vertebral level. Partially visible common bile duct enlargement at the porta hepatis and tracking to the duodenum (series 6, image 2) estimated at up to 11  mm. The duct appears to taper to the papilla. The gallbladder appears to be present. Other visible abdominal viscera and paraspinal soft tissues are within normal limits. Disc levels: Lumbar disc space loss, circumferential disc bulging, and posterior element hypertrophy. At L4-L5 there is leftward disc bulging and focal left subarticular disc protrusion (series 3, image 9 and series 6, image 25) superimposed on moderate facet and ligament flavum hypertrophy and mild L4 inferior endplate retropulsion. Moderate to severe proximal left L4 neural foraminal stenosis. Up to moderate left lateral recess stenosis (descending left L5 nerve root level). IMPRESSION: 1. Acute to subacute mild L4 compression fracture with inferior endplate deformity, mild bony retropulsion, and superimposed leftward disc disease combining for moderate to severe left neural foraminal and left lateral recess stenosis. Query left L4 and/or L5 radiculitis. 2. Other lumbar spine degeneration is typical for age. 3. Partially visible extrahepatic biliary ductal enlargement of unknown significance. Query hyperbilirubinemia. Electronically Signed   By: Genevie Ann M.D.   On: 02/14/2017 10:40     Daleen Bo, MD 02/14/17 1139

## 2017-02-14 NOTE — Discharge Instructions (Signed)
Please call the radiology MRI unit at [phone] to schedule the MRI for tomorrow morning.Please use meloxicam daily with food. Please use Percocet every 6 hours as needed for pain. This medication will cause drowsiness, as well as constipation. Please use the stool softener of your choice while taking this medication. Use Zofran for nausea if needed. Use a heating pad to your lower back. Once you have obtained the results of your MRI, please contact your radiology specialist so that they may make the appropriate plans.Please see Ms. Hassell Done or return to the emergency department if not improving.

## 2017-02-14 NOTE — ED Provider Notes (Signed)
[facility] DEPT Provider Note   CSN: [id number] Arrival date & time: 02/13/17  1904     History   Chief Complaint Chief Complaint  Patient presents with  . Back Pain    HPI Cheryl Lee is a 71 y.o. female.  Patient is a 71 year old female who presents to the emergency department with a complaint of left lower back pain and left leg pain.  The patient states that for a little over a month she's been having problem with leg pain and back pain. She has been evaluated by her primary physician. She has been placed on oral medication as well as gels. She states these have not been working. She was seen by her primary physician on Monday, October 1 and was told that she had a compression fracture. She was sent to the orthopedic specialist Dr. Joni Fears. It was Dr. Milana Obey feels opinion that the patient needed an MRI to further evaluate the pain in the problem. There was also some question of spinal stenosis creating the pain and problem the patient was having with the left extremity. The patient states that this point when she attempts to lift her left leg even minimally she has severe pain from the knee up to the hip and at times it goes from the hip into the back area. She has a hard time finding a comfortable place to rest. The pain is severe when she changes positions or attempts to walk or stand for any. Of time. She's not had any loss of bowel or bladder function. She's not had frequent falls.      Past Medical History:  Diagnosis Date  . Acid reflux   . Hypercholesteremia   . Hypertension   . Osteoporosis   . Sinus congestion     Patient Active Problem List   Diagnosis Date Noted  . BMI 29.0-29.9,adult 12/21/2014  . GERD (gastroesophageal reflux disease)   . Hyperlipidemia with target LDL less than 100   . Hypertension   . Osteoporosis     Past Surgical History:  Procedure Laterality Date  . CATARACT EXTRACTION    . Right snkle repair      OB History    No data available       Home Medications    Prior to Admission medications   Medication Sig Start Date End Date Taking? Authorizing Provider  alendronate (FOSAMAX) 70 MG tablet Take 1 tablet (70 mg total) by mouth every 7 (seven) days. Take with a full glass of water on an empty stomach. Patient taking differently: Take 70 mg by mouth every Monday. Take with a full glass of water on an empty stomach. 11/29/16  Yes Hassell Done, Mary-Margaret, FNP  aspirin 81 MG tablet Take 81 mg by mouth daily.   Yes [provider]  atorvastatin (LIPITOR) 10 MG tablet Take 1 tablet (10 mg total) by mouth daily. 11/29/16  Yes Martin, Mary-Margaret, FNP  fluticasone (FLONASE) 50 MCG/ACT nasal spray Place 2 sprays into both nostrils daily. 10/17/16  Yes Martin, Mary-Margaret, FNP  lisinopril-hydrochlorothiazide (PRINZIDE,ZESTORETIC) 20-12.5 MG tablet Take 0.5 tablets by mouth daily. 11/29/16  Yes Hassell Done, Mary-Margaret, FNP  Multiple Vitamins-Minerals (MULTIVITAMINS THER. W/MINERALS) TABS Take 0.5 tablets by mouth 2 (two) times daily.    Yes [provider]  omeprazole (PRILOSEC) 20 MG capsule Take 1 capsule (20 mg total) by mouth daily. 11/29/16  Yes Martin, Mary-Margaret, FNP  vitamin C (ASCORBIC ACID) 500 MG tablet Take 500 mg by mouth daily.  Yes [provider]  diclofenac sodium (VOLTAREN) 1 % GEL Apply 2 g topically 4 (four) times daily. Patient not taking: Reported on 02/11/2017 12/31/16   Chevis Pretty, FNP  meloxicam (MOBIC) 15 MG tablet Take 1 tablet (15 mg total) by mouth daily. 02/14/17   Lily Kocher, PA-C  ondansetron (ZOFRAN) 4 MG tablet Take 1 tablet (4 mg total) by mouth every 6 (six) hours. 02/14/17   Lily Kocher, PA-C  ondansetron (ZOFRAN) 4 MG tablet Take 1 tablet (4 mg total) by mouth every 6 (six) hours. 02/14/17   Lily Kocher, PA-C  oxyCODONE-acetaminophen (PERCOCET/ROXICET) 5-325 MG tablet Take 1 tablet by mouth every 6 (six) hours as needed for severe pain.  02/14/17   Lily Kocher, PA-C  oxyCODONE-acetaminophen (PERCOCET/ROXICET) 5-325 MG tablet Take 1 tablet by mouth every 6 (six) hours as needed. 02/14/17   Lily Kocher, PA-C    Family History Family History  Problem Relation Age of Onset  . Cancer Mother   . Alzheimer's disease Mother   . Heart disease Mother   . Early death Father   . Diabetes Sister   . Diabetes Sister   . Hypertension Sister     Social History Social History  Substance Use Topics  . Smoking status: Never Smoker  . Smokeless tobacco: Never Used  . Alcohol use No     Allergies   Lycopene   Review of Systems Review of Systems  Constitutional: Negative for activity change.       All ROS Neg except as noted in HPI  HENT: Negative for nosebleeds.   Eyes: Negative for photophobia and discharge.  Respiratory: Negative for cough, shortness of breath and wheezing.   Cardiovascular: Negative for chest pain and palpitations.  Gastrointestinal: Negative for abdominal pain and blood in stool.  Genitourinary: Negative for dysuria, frequency and hematuria.  Musculoskeletal: Positive for arthralgias and back pain. Negative for neck pain.  Skin: Negative.   Neurological: Negative for dizziness, seizures and speech difficulty.  Psychiatric/Behavioral: Negative for confusion and hallucinations.     Physical Exam Updated Vital Signs BP (!) 141/85 (BP Location: Right Arm)   Pulse 68   Temp 98.2 F (36.8 C) (Oral)   Resp 16   Ht 5' (1.524 m)   Wt 63.5 kg (140 lb)   LMP 08/05/1991   SpO2 97%   BMI 27.34 kg/m   Physical Exam  Constitutional: She is oriented to person, place, and time. She appears well-developed and well-nourished.  Non-toxic appearance.  HENT:  Head: Normocephalic.  Right Ear: Tympanic membrane and external ear normal.  Left Ear: Tympanic membrane and external ear normal.  Eyes: Pupils are equal, round, and reactive to light. EOM and lids are normal.  Neck: Normal range of motion. Neck  supple. Carotid bruit is not present.  Cardiovascular: Normal rate, regular rhythm, normal heart sounds, intact distal pulses and normal pulses.   Pulmonary/Chest: Breath sounds normal. No respiratory distress.  Abdominal: Soft. Bowel sounds are normal. There is no tenderness. There is no guarding.  Musculoskeletal: Normal range of motion.  There is no palpable step off of the cervical, thoracic, or lumbar spine. There no hot areas appreciated. There is pain to percussion of the lower lumbar area. There is pain of the left paraspinal muscle area extending into the hip.  There is a positive straight leg raise at 15-20. There is good range of motion of the left ankle. There is good range of motion of the left knee. There no hot joints  on the left or the right lower extremity. There is no palpable hematoma or hot area involving the quadricep or thigh area.  Lymphadenopathy:       Head (right side): No submandibular adenopathy present.       Head (left side): No submandibular adenopathy present.    She has no cervical adenopathy.  Neurological: She is alert and oriented to person, place, and time. She has normal strength. No cranial nerve deficit or sensory deficit.  Skin: Skin is warm and dry.  Psychiatric: She has a normal mood and affect. Her speech is normal.  Nursing note and vitals reviewed.    ED Treatments / Results  Labs (all labs ordered are listed, but only abnormal results are displayed) Labs Reviewed - No data to display  EKG  EKG Interpretation None       Radiology No results found.  Procedures Procedures (including critical care time)  Medications Ordered in ED Medications  HYDROmorphone (DILAUDID) injection 0.5 mg (0.5 mg Intramuscular Given 02/13/17 2342)  methocarbamol (ROBAXIN) tablet 500 mg (500 mg Oral Given 02/13/17 2341)  ondansetron (ZOFRAN) tablet 4 mg (4 mg Oral Given 02/13/17 2341)  oxyCODONE-acetaminophen (PERCOCET/ROXICET) 5-325 MG per tablet 1 tablet  (1 tablet Oral Given 02/14/17 0047)     Initial Impression / Assessment and Plan / ED Course  I have reviewed the triage vital signs and the nursing notes.  Pertinent labs & imaging results that were available during my care of the patient were reviewed by me and considered in my medical decision making (see chart for details).       Final Clinical Impressions(s) / ED Diagnoses MDM Vital signs reviewed. I have reviewed the lumbar spine films done 2 days ago. There is question of a small compression fracture at the L4 area. There is extensive degenerative disc disease changes also noted of the lumbar spine.  Patient was treated with 0.5 mg of Dilaudid and 500 mg of Robaxin. Patient states she got very little relief from this. She could not move from wheelchair onto the bed.  Patient was given Percocet. She states this helped him more, but made her a little nauseated. She still did not feel that she could get from the chair to the bed without causing a great deal of pain.  Case discussed in detail with Dr. Stark Jock.  Will schedule the patient to have the MRI that was previously scheduled on tomorrow here at this facility. Patient is given a to go pack of Percocet and Zofran for nausea. Patient is given a prescription for meloxicam and Percocet. I warned the patient that this medication can cause drowsiness and she should use a stool softener while taking this medication. Also asked her to take the Percocet and the meloxicam with food. The patient acknowledges understanding of these instructions. After the MRI tomorrow, the patient will speak with her orthopedic specialist Dr. Durward Fortes so that he may review the results of the MRI and also help her with planning pain management.    Final diagnoses:  Left sided sciatica  Lumbar radiculopathy    New Prescriptions New Prescriptions   MELOXICAM (MOBIC) 15 MG TABLET    Take 1 tablet (15 mg total) by mouth daily.   ONDANSETRON (ZOFRAN) 4 MG TABLET     Take 1 tablet (4 mg total) by mouth every 6 (six) hours.   ONDANSETRON (ZOFRAN) 4 MG TABLET    Take 1 tablet (4 mg total) by mouth every 6 (six) hours.   OXYCODONE-ACETAMINOPHEN (  PERCOCET/ROXICET) 5-325 MG TABLET    Take 1 tablet by mouth every 6 (six) hours as needed for severe pain.   OXYCODONE-ACETAMINOPHEN (PERCOCET/ROXICET) 5-325 MG TABLET    Take 1 tablet by mouth every 6 (six) hours as needed.     Lily Kocher, PA-C 02/14/17 6122    Veryl Speak, MD 02/14/17 [phone]

## 2017-02-15 ENCOUNTER — Emergency Department (HOSPITAL_COMMUNITY)
Admission: EM | Admit: 2017-02-15 | Discharge: 2017-02-15 | Disposition: A | Discharge status: Home Health | Payer: Medicare Other | Attending: Emergency Medicine | Admitting: Emergency Medicine

## 2017-02-15 ENCOUNTER — Encounter (HOSPITAL_COMMUNITY): Payer: Self-pay

## 2017-02-15 ENCOUNTER — Emergency Department (HOSPITAL_COMMUNITY): Payer: Medicare Other

## 2017-02-15 DIAGNOSIS — Z7982 Long term (current) use of aspirin: Secondary | ICD-10-CM | POA: Diagnosis not present

## 2017-02-15 DIAGNOSIS — R059 Cough, unspecified: Secondary | ICD-10-CM

## 2017-02-15 DIAGNOSIS — R05 Cough: Secondary | ICD-10-CM | POA: Diagnosis not present

## 2017-02-15 DIAGNOSIS — M5432 Sciatica, left side: Secondary | ICD-10-CM | POA: Diagnosis not present

## 2017-02-15 DIAGNOSIS — R609 Edema, unspecified: Secondary | ICD-10-CM | POA: Diagnosis not present

## 2017-02-15 DIAGNOSIS — I10 Essential (primary) hypertension: Secondary | ICD-10-CM | POA: Insufficient documentation

## 2017-02-15 DIAGNOSIS — Z79899 Other long term (current) drug therapy: Secondary | ICD-10-CM | POA: Diagnosis not present

## 2017-02-15 DIAGNOSIS — M2550 Pain in unspecified joint: Secondary | ICD-10-CM | POA: Diagnosis not present

## 2017-02-15 DIAGNOSIS — R110 Nausea: Secondary | ICD-10-CM | POA: Diagnosis not present

## 2017-02-15 DIAGNOSIS — R600 Localized edema: Secondary | ICD-10-CM | POA: Diagnosis not present

## 2017-02-15 LAB — URINALYSIS, ROUTINE W REFLEX MICROSCOPIC
Bilirubin Urine: NEGATIVE
GLUCOSE, UA: NEGATIVE mg/dL
Hgb urine dipstick: NEGATIVE
KETONES UR: NEGATIVE mg/dL
Nitrite: NEGATIVE
PH: 5 (ref 5.0–8.0)
Protein, ur: NEGATIVE mg/dL
SPECIFIC GRAVITY, URINE: 1.011 (ref 1.005–1.030)
SQUAMOUS EPITHELIAL / LPF: NONE SEEN

## 2017-02-15 LAB — HEPATIC FUNCTION PANEL
ALBUMIN: 3.6 g/dL (ref 3.5–5.0)
ALK PHOS: 71 U/L (ref 38–126)
ALT: 16 U/L (ref 14–54)
AST: 19 U/L (ref 15–41)
BILIRUBIN DIRECT: 0.1 mg/dL (ref 0.1–0.5)
BILIRUBIN TOTAL: 0.7 mg/dL (ref 0.3–1.2)
Indirect Bilirubin: 0.6 mg/dL (ref 0.3–0.9)
Total Protein: 6.7 g/dL (ref 6.5–8.1)

## 2017-02-15 LAB — BASIC METABOLIC PANEL
Anion gap: 11 (ref 5–15)
BUN: 39 mg/dL — AB (ref 6–20)
CHLORIDE: 102 mmol/L (ref 101–111)
CO2: 24 mmol/L (ref 22–32)
CREATININE: 1.75 mg/dL — AB (ref 0.44–1.00)
Calcium: 9.4 mg/dL (ref 8.9–10.3)
GFR calc Af Amer: 33 mL/min — ABNORMAL LOW (ref >60)
GFR, EST NON AFRICAN AMERICAN: 28 mL/min — AB (ref >60)
GLUCOSE: 115 mg/dL — AB (ref 65–99)
Potassium: 4 mmol/L (ref 3.5–5.1)
SODIUM: 137 mmol/L (ref 135–145)

## 2017-02-15 LAB — CBC
HCT: 39.3 % (ref 36.0–46.0)
Hemoglobin: 13 g/dL (ref 12.0–15.0)
MCH: 32.5 pg (ref 26.0–34.0)
MCHC: 33.1 g/dL (ref 30.0–36.0)
MCV: 98.3 fL (ref 78.0–100.0)
PLATELETS: 286 10*3/uL (ref 150–400)
RBC: 4 MIL/uL (ref 3.87–5.11)
RDW: 13.3 % (ref 11.5–15.5)
WBC: 18.5 10*3/uL — AB (ref 4.0–10.5)

## 2017-02-15 MED ORDER — SODIUM CHLORIDE 0.9 % IV BOLUS (SEPSIS): 1000.0000 mL | Freq: Once | INTRAVENOUS | Status: DC

## 2017-02-15 MED ORDER — SODIUM CHLORIDE 0.9 % IV BOLUS (SEPSIS)
500.0000 mL | Freq: Once | INTRAVENOUS | Status: AC
  Administered 2017-02-15: 500 mL via INTRAVENOUS

## 2017-02-15 MED ORDER — OXYCODONE-ACETAMINOPHEN 5-325 MG PO TABS
1.0000 | ORAL_TABLET | Freq: Once | ORAL | Status: AC
  Administered 2017-02-15: 1 via ORAL
  Filled 2017-02-15: qty 1

## 2017-02-15 MED ORDER — ONDANSETRON HCL 4 MG/2ML IJ SOLN
4.0000 mg | Freq: Once | INTRAMUSCULAR | Status: AC
  Administered 2017-02-15: 4 mg via INTRAVENOUS
  Filled 2017-02-15: qty 2

## 2017-02-15 NOTE — ED Provider Notes (Signed)
[facility] DEPT Provider Note   CSN: [id number] Arrival date & time: 02/15/17  0720     History   Chief Complaint Chief Complaint  Patient presents with  . Leg Swelling    HPI Cheryl Lee is a 71 y.o. female who is currently being evaluated and treated for newly diagnosed severe lumbar degenerative disk disease with radiculitis and a vertebral compression fracture of unknown chronicity, currently awaiting her first steroid injection from Dr Durward Fortes, presenting with a one day history of increasing pain and swelling in her left leg to her ankle.  She endorses she had been very sedentary for the past week since her back became painful.  She denies sob, cp, fevers, chills, injury. She has had nausea along with decreased appetite, suspects this is due to pain but also the oxycodone she is taking, although this medicine is helping with her back pain, it makes her nauseated.  It is improved some when she takes it with zofran.   The history is provided by the patient and a relative.    Past Medical History:  Diagnosis Date  . Acid reflux   . Hypercholesteremia   . Hypertension   . Osteoporosis   . Sinus congestion     Patient Active Problem List   Diagnosis Date Noted  . BMI 29.0-29.9,adult 12/21/2014  . GERD (gastroesophageal reflux disease)   . Hyperlipidemia with target LDL less than 100   . Hypertension   . Osteoporosis     Past Surgical History:  Procedure Laterality Date  . CATARACT EXTRACTION    . Right snkle repair      OB History    No data available       Home Medications    Prior to Admission medications   Medication Sig Start Date End Date Taking? Authorizing Provider  alendronate (FOSAMAX) 70 MG tablet Take 1 tablet (70 mg total) by mouth every 7 (seven) days. Take with a full glass of water on an empty stomach. Patient taking differently: Take 70 mg by mouth every Monday. Take with a full glass of water on an empty stomach. 11/29/16  Yes Hassell Done,  Mary-Margaret, FNP  aspirin 81 MG tablet Take 81 mg by mouth daily.   Yes [provider]  atorvastatin (LIPITOR) 10 MG tablet Take 1 tablet (10 mg total) by mouth daily. 11/29/16  Yes Martin, Mary-Margaret, FNP  fluticasone (FLONASE) 50 MCG/ACT nasal spray Place 2 sprays into both nostrils daily. 10/17/16  Yes Martin, Mary-Margaret, FNP  lisinopril-hydrochlorothiazide (PRINZIDE,ZESTORETIC) 20-12.5 MG tablet Take 0.5 tablets by mouth daily. 11/29/16  Yes Hassell Done, Mary-Margaret, FNP  meloxicam (MOBIC) 15 MG tablet Take 1 tablet (15 mg total) by mouth daily. 02/14/17  Yes Lily Kocher, PA-C  Multiple Vitamins-Minerals (MULTIVITAMINS THER. W/MINERALS) TABS Take 0.5 tablets by mouth 2 (two) times daily.    Yes [provider]  omeprazole (PRILOSEC) 20 MG capsule Take 1 capsule (20 mg total) by mouth daily. 11/29/16  Yes Martin, Mary-Margaret, FNP  ondansetron (ZOFRAN) 4 MG tablet Take 1 tablet (4 mg total) by mouth every 6 (six) hours. 02/14/17  Yes Lily Kocher, PA-C  oxyCODONE-acetaminophen (PERCOCET/ROXICET) 5-325 MG tablet Take 1 tablet by mouth every 6 (six) hours as needed. 02/14/17  Yes Lily Kocher, PA-C  vitamin C (ASCORBIC ACID) 500 MG tablet Take 500 mg by mouth daily.     Yes [provider]  diclofenac sodium (VOLTAREN) 1 % GEL Apply 2 g topically 4 (four) times daily. Patient not taking: Reported on  02/11/2017 12/31/16   Chevis Pretty, FNP    Family History Family History  Problem Relation Age of Onset  . Cancer Mother   . Alzheimer's disease Mother   . Heart disease Mother   . Early death Father   . Diabetes Sister   . Diabetes Sister   . Hypertension Sister     Social History Social History  Substance Use Topics  . Smoking status: Never Smoker  . Smokeless tobacco: Never Used  . Alcohol use No     Allergies   Lycopene   Review of Systems Review of Systems  Constitutional: Negative for fever.  Respiratory: Negative for shortness of  breath.   Cardiovascular: Negative for chest pain.  Musculoskeletal: Positive for arthralgias and back pain. Negative for joint swelling and myalgias.  Neurological: Negative for weakness and numbness.     Physical Exam Updated Vital Signs BP 137/71   Pulse 92   Temp 98.4 F (36.9 C) (Oral)   Resp 20   Ht 5' (1.524 m)   Wt 63.5 kg (140 lb)   LMP 08/05/1991   SpO2 98%   BMI 27.34 kg/m   Physical Exam  Constitutional: She appears well-developed and well-nourished.  Pt laying on her right side with left leg crossed over the right, reporting as her only comfortable position.  HENT:  Head: Normocephalic and atraumatic.  Eyes: Conjunctivae are normal.  Neck: Normal range of motion.  Cardiovascular: Normal rate, regular rhythm, normal heart sounds and intact distal pulses.   Pulses:      Dorsalis pedis pulses are 2+ on the right side, and 2+ on the left side.  Pulmonary/Chest: Effort normal and breath sounds normal. She has no wheezes.  Abdominal: Soft. Bowel sounds are normal. There is no tenderness.  Musculoskeletal: Normal range of motion. She exhibits edema.  Bilateral ankle edema, left greater than right, trace right.   Bilateral calfs are soft, ttp upper left calf and lower posterior thigh, no induration, erythema, no palpable cords.   Neurological: She is alert.  Skin: Skin is warm and dry.  Psychiatric: She has a normal mood and affect.  Nursing note and vitals reviewed.    ED Treatments / Results  Labs (all labs ordered are listed, but only abnormal results are displayed) Labs Reviewed  BASIC METABOLIC PANEL - Abnormal; Notable for the following:       Result Value   Glucose, Bld 115 (*)    BUN 39 (*)    Creatinine, Ser 1.75 (*)    GFR calc non Af Amer 28 (*)    GFR calc Af Amer 33 (*)    All other components within normal limits  CBC - Abnormal; Notable for the following:    WBC 18.5 (*)    All other components within normal limits  URINALYSIS, ROUTINE W  REFLEX MICROSCOPIC - Abnormal; Notable for the following:    Leukocytes, UA TRACE (*)    Bacteria, UA RARE (*)    All other components within normal limits  HEPATIC FUNCTION PANEL    EKG  EKG Interpretation None       Radiology Dg Chest 1 View  Result Date: 02/15/2017 CLINICAL DATA:  71 year old female with cough. L4 compression fracture diagnosed recently following back pain for nearly a week. EXAM: CHEST 1 VIEW COMPARISON:  Chest radiographs 07/04/2015. FINDINGS: Portable AP semi upright view at 1000 hours. Chronic but increased curvilinear opacity at both lung bases most compatible with atelectasis. Elsewhere when allowing for portable technique  the lungs are clear. Stable cardiac size and mediastinal contours. Visualized tracheal air column is within normal limits. Negative visible bowel gas pattern. IMPRESSION: Lung base atelectasis.  No other acute cardiopulmonary abnormality. Electronically Signed   By: Genevie Ann M.D.   On: 02/15/2017 10:26   Mr Lumbar Spine Wo Contrast  Result Date: 02/14/2017 CLINICAL DATA:  71 year old female with 1 week of lumbar back pain radiating to the left leg with no known injury. EXAM: MRI LUMBAR SPINE WITHOUT CONTRAST TECHNIQUE: Multiplanar, multisequence MR imaging of the lumbar spine was performed. No intravenous contrast was administered. COMPARISON:  Lumbar radiographs 02/11/2017. FINDINGS: Segmentation:  Normal as demonstrated on the comparison radiographs. Alignment: Mild levoconvex lumbar spine curvature. Straightening of lumbar lordosis. Mild anterolisthesis of L1 on L2 and retrolisthesis of L3 on L4. Vertebrae: Mild L4 vertebral body loss of height with inferior endplate deformity demonstrating mild to moderate inferior endplate marrow edema. 25-30% loss of vertebral body height. Mild retropulsion of the posterior inferior endplate. Marrow edema in the left L4 pedicle. The other posterior elements appear intact. Elsewhere Visualized bone marrow signal  is within normal limits. No other marrow edema or acute osseous abnormality. Intact visible sacrum and SI joints. Conus medullaris: Extends to the T12-L1 level and appears normal. Paraspinal and other soft tissues: Mild edema within the medial left psoas muscle at the L4 vertebral level. Partially visible common bile duct enlargement at the porta hepatis and tracking to the duodenum (series 6, image 2) estimated at up to 11 mm. The duct appears to taper to the papilla. The gallbladder appears to be present. Other visible abdominal viscera and paraspinal soft tissues are within normal limits. Disc levels: Lumbar disc space loss, circumferential disc bulging, and posterior element hypertrophy. At L4-L5 there is leftward disc bulging and focal left subarticular disc protrusion (series 3, image 9 and series 6, image 25) superimposed on moderate facet and ligament flavum hypertrophy and mild L4 inferior endplate retropulsion. Moderate to severe proximal left L4 neural foraminal stenosis. Up to moderate left lateral recess stenosis (descending left L5 nerve root level). IMPRESSION: 1. Acute to subacute mild L4 compression fracture with inferior endplate deformity, mild bony retropulsion, and superimposed leftward disc disease combining for moderate to severe left neural foraminal and left lateral recess stenosis. Query left L4 and/or L5 radiculitis. 2. Other lumbar spine degeneration is typical for age. 3. Partially visible extrahepatic biliary ductal enlargement of unknown significance. Query hyperbilirubinemia. Electronically Signed   By: Genevie Ann M.D.   On: 02/14/2017 10:40   US Venous Img Lower Unilateral Left  Result Date: 02/15/2017 CLINICAL DATA:  Lower extremity pain and edema EXAM: LEFT LOWER EXTREMITY VENOUS DUPLEX ULTRASOUND TECHNIQUE: Gray-scale sonography with graded compression, as well as color Doppler and duplex ultrasound were performed to evaluate the left lower extremity deep venous system from the  level of the common femoral vein and including the common femoral, femoral, profunda femoral, popliteal and calf veins including the posterior tibial, peroneal and gastrocnemius veins when visible. The superficial great saphenous vein was also interrogated. Spectral Doppler was utilized to evaluate flow at rest and with distal augmentation maneuvers in the common femoral, femoral and popliteal veins. COMPARISON:  None. FINDINGS: Contralateral Common Femoral Vein: Respiratory phasicity is normal and symmetric with the symptomatic side. No evidence of thrombus. Normal compressibility. Common Femoral Vein: No evidence of thrombus. Normal compressibility, respiratory phasicity and response to augmentation. Saphenofemoral Junction: No evidence of thrombus. Normal compressibility and flow on color Doppler imaging. Profunda  Femoral Vein: No evidence of thrombus. Normal compressibility and flow on color Doppler imaging. Femoral Vein: No evidence of thrombus. Normal compressibility, respiratory phasicity and response to augmentation. Popliteal Vein: No evidence of thrombus. Normal compressibility, respiratory phasicity and response to augmentation. Calf Veins: No evidence of thrombus. Normal compressibility and flow on color Doppler imaging. Superficial Great Saphenous Vein: No evidence of thrombus. Normal compressibility and flow on color Doppler imaging. Venous Reflux:  None. Other Findings:  None. IMPRESSION: No evidence of deep venous thrombosis in the left lower extremity. Right common femoral vein also patent. Electronically Signed   By: Lowella Grip III M.D.   On: 02/15/2017 09:18    Procedures Procedures (including critical care time)  Medications Ordered in ED Medications  ondansetron (ZOFRAN) injection 4 mg (4 mg Intravenous Given 02/15/17 0820)  oxyCODONE-acetaminophen (PERCOCET/ROXICET) 5-325 MG per tablet 1 tablet (1 tablet Oral Given 02/15/17 0830)  sodium chloride 0.9 % bolus 500 mL (0 mLs  Intravenous Stopped 02/15/17 1049)     Initial Impression / Assessment and Plan / ED Course  I have reviewed the triage vital signs and the nursing notes.  Pertinent labs & imaging results that were available during my care of the patient were reviewed by me and considered in my medical decision making (see chart for details).     Labs and imaging reviewed with dvt ruled out today. She has dehydration with increased BUN and creatinine. She was given IV fluids here and advised to increased fluid intake, recheck these labs in 3 days with pcp..  WBC elevation of unclear etiology - no source of possible infection found today. Possibly pain driven. Advised recheck for any worsened or new sx.  Also, discussed having a home health consult for assistance in her home with mobility issues and other supportive help - pt defers, currently staying with dg who has been helpful. Questioned need for neurosurgical evaluation , referral given for this.   Pt discussed with Dr. Laverta Baltimore, who also saw pt during visit.  Final Clinical Impressions(s) / ED Diagnoses   Final diagnoses:  Peripheral edema  Sciatica of left side    New Prescriptions Discharge Medication List as of 02/15/2017 12:05 PM       Evalee Jefferson, PA-C 02/15/17 2202    Long, Wonda Old[facility], MD 02/16/17 [phone]

## 2017-02-15 NOTE — ED Triage Notes (Signed)
Pt reports last Friday she started having back pain and had MRI yesterday and was told she has a compression fracture and a bulging disc.  Reports she was here Wednesday because pain was so bad.  Reports pain radiates into left thigh.  Today pt reports swelling to left leg.  Pt c/o nausea and unable to tolerate laying in bed.

## 2017-02-15 NOTE — Discharge Instructions (Signed)
Your lab tests , ultrasound and xrays are normal except as discussed.  Your creatinine and your BUN are both elevated today, indicating that you are dehydrated and need to make sure you are drinking more fluids.  You have received fluids here, but you need to have these 2 tests rechecked by your doctor on Monday to make sure they are improving. Continue taking your pain medicine as needed along with the zofran to avoid nausea.

## 2017-02-17 ENCOUNTER — Emergency Department (HOSPITAL_COMMUNITY)
Admission: EM | Admit: 2017-02-17 | Discharge: 2017-02-17 | Disposition: A | Discharge status: Home / Self Care | Payer: Medicare Other | Attending: Emergency Medicine | Admitting: Emergency Medicine

## 2017-02-17 ENCOUNTER — Encounter (HOSPITAL_COMMUNITY): Payer: Self-pay | Admitting: Emergency Medicine

## 2017-02-17 DIAGNOSIS — M5442 Lumbago with sciatica, left side: Secondary | ICD-10-CM | POA: Diagnosis not present

## 2017-02-17 DIAGNOSIS — M79605 Pain in left leg: Secondary | ICD-10-CM | POA: Diagnosis present

## 2017-02-17 DIAGNOSIS — Z7982 Long term (current) use of aspirin: Secondary | ICD-10-CM | POA: Diagnosis not present

## 2017-02-17 DIAGNOSIS — I10 Essential (primary) hypertension: Secondary | ICD-10-CM | POA: Diagnosis not present

## 2017-02-17 DIAGNOSIS — Z79899 Other long term (current) drug therapy: Secondary | ICD-10-CM | POA: Diagnosis not present

## 2017-02-17 MED ORDER — PREDNISONE 20 MG PO TABS
60.0000 mg | ORAL_TABLET | Freq: Once | ORAL | Status: AC
  Administered 2017-02-17: 60 mg via ORAL
  Filled 2017-02-17: qty 3

## 2017-02-17 MED ORDER — IBUPROFEN 400 MG PO TABS: 400.0000 mg | ORAL_TABLET | Freq: Three times a day (TID) | ORAL | 0 refills | Status: DC | PRN

## 2017-02-17 MED ORDER — IBUPROFEN 400 MG PO TABS
600.0000 mg | ORAL_TABLET | Freq: Once | ORAL | Status: AC
  Administered 2017-02-17: 06:00:00 600 mg via ORAL
  Filled 2017-02-17: qty 1

## 2017-02-17 MED ORDER — MORPHINE SULFATE (PF) 4 MG/ML IV SOLN
8.0000 mg | Freq: Once | INTRAVENOUS | Status: AC
  Administered 2017-02-17: 8 mg via INTRAMUSCULAR
  Filled 2017-02-17: qty 2

## 2017-02-17 MED ORDER — CYCLOBENZAPRINE HCL 10 MG PO TABS: 10.0000 mg | ORAL_TABLET | Freq: Three times a day (TID) | ORAL | 0 refills | Status: DC | PRN

## 2017-02-17 MED ORDER — PREDNISONE 20 MG PO TABS: 40.0000 mg | ORAL_TABLET | Freq: Every day | ORAL | 0 refills | Status: DC

## 2017-02-17 MED ORDER — OXYCODONE HCL 5 MG PO TABS: 5.0000 mg | ORAL_TABLET | ORAL | 0 refills | Status: DC | PRN

## 2017-02-17 NOTE — ED Provider Notes (Signed)
[facility] DEPT Provider Note   CSN: [id number] Arrival date & time: 02/17/17  0158     History   Chief Complaint Chief Complaint  Patient presents with  . Leg Pain    HPI Cheryl Lee is a 71 y.o. female.  HPI Patient is a 71 year old female who was recently diagnosed with a new L4 compression fracture with resultant foraminal narrowing on the left side and likely left-sided sciatica.  She was discharged home on Percocet but reports ongoing discomfort and pain despite pain medication.  She is taking 1 Percocet every 6 hours as needed for pain and states this is not helping with her discomfort.  No new weakness in her arms or legs.  No fevers or chills.  She continues to have pain coming from her left low back down her left buttock region and down her left leg.  She has not found a neurosurgeon to follow up with yet.  No other new complaints.  No bladder or bowel dysfunction   Past Medical History:  Diagnosis Date  . Acid reflux   . Hypercholesteremia   . Hypertension   . Osteoporosis   . Sinus congestion     Patient Active Problem List   Diagnosis Date Noted  . BMI 29.0-29.9,adult 12/21/2014  . GERD (gastroesophageal reflux disease)   . Hyperlipidemia with target LDL less than 100   . Hypertension   . Osteoporosis     Past Surgical History:  Procedure Laterality Date  . CATARACT EXTRACTION    . Right snkle repair      OB History    No data available       Home Medications    Prior to Admission medications   Medication Sig Start Date End Date Taking? Authorizing Provider  alendronate (FOSAMAX) 70 MG tablet Take 1 tablet (70 mg total) by mouth every 7 (seven) days. Take with a full glass of water on an empty stomach. Patient taking differently: Take 70 mg by mouth every Monday. Take with a full glass of water on an empty stomach. 11/29/16  Yes Hassell Done, Mary-Margaret, FNP  aspirin 81 MG tablet Take 81 mg by mouth daily.   Yes [provider]    atorvastatin (LIPITOR) 10 MG tablet Take 1 tablet (10 mg total) by mouth daily. 11/29/16  Yes Martin, Mary-Margaret, FNP  fluticasone (FLONASE) 50 MCG/ACT nasal spray Place 2 sprays into both nostrils daily. 10/17/16  Yes Martin, Mary-Margaret, FNP  lisinopril-hydrochlorothiazide (PRINZIDE,ZESTORETIC) 20-12.5 MG tablet Take 0.5 tablets by mouth daily. 11/29/16  Yes Hassell Done, Mary-Margaret, FNP  Multiple Vitamins-Minerals (MULTIVITAMINS THER. W/MINERALS) TABS Take 0.5 tablets by mouth 2 (two) times daily.    Yes [provider]  omeprazole (PRILOSEC) 20 MG capsule Take 1 capsule (20 mg total) by mouth daily. 11/29/16  Yes Martin, Mary-Margaret, FNP  ondansetron (ZOFRAN) 4 MG tablet Take 1 tablet (4 mg total) by mouth every 6 (six) hours. 02/14/17  Yes Lily Kocher, PA-C  vitamin C (ASCORBIC ACID) 500 MG tablet Take 500 mg by mouth daily.     Yes [provider]  cyclobenzaprine (FLEXERIL) 10 MG tablet Take 1 tablet (10 mg total) by mouth 3 (three) times daily as needed for muscle spasms. 02/17/17   Jola Schmidt, MD  diclofenac sodium (VOLTAREN) 1 % GEL Apply 2 g topically 4 (four) times daily. Patient not taking: Reported on 02/11/2017 12/31/16   Chevis Pretty, FNP  ibuprofen (ADVIL,MOTRIN) 400 MG tablet Take 1 tablet (400 mg total) by mouth every 8 (  eight) hours as needed. 02/17/17   Jola Schmidt, MD  oxyCODONE (ROXICODONE) 5 MG immediate release tablet Take 1 tablet (5 mg total) by mouth every 4 (four) hours as needed for severe pain. 02/17/17   Jola Schmidt, MD  predniSONE (DELTASONE) 20 MG tablet Take 2 tablets (40 mg total) by mouth daily. 02/17/17 02/22/17  Jola Schmidt, MD    Family History Family History  Problem Relation Age of Onset  . Cancer Mother   . Alzheimer's disease Mother   . Heart disease Mother   . Early death Father   . Diabetes Sister   . Diabetes Sister   . Hypertension Sister     Social History Social History  Substance Use Topics  . Smoking  status: Never Smoker  . Smokeless tobacco: Never Used  . Alcohol use No     Allergies   Lycopene   Review of Systems Review of Systems  All other systems reviewed and are negative.    Physical Exam Updated Vital Signs BP 126/77   Pulse 95   Temp 100 F (37.8 C) (Oral)   Resp 18   Ht 5' (1.524 m)   Wt 63.5 kg (140 lb)   LMP 08/05/1991   SpO2 94%   BMI 27.34 kg/m   Physical Exam  Constitutional: She is oriented to person, place, and time. She appears well-developed and well-nourished. No distress.  HENT:  Head: Normocephalic and atraumatic.  Eyes: EOM are normal.  Neck: Normal range of motion.  Cardiovascular: Normal rate, regular rhythm and normal heart sounds.   Pulmonary/Chest: Effort normal and breath sounds normal.  Abdominal: Soft. She exhibits no distension. There is no tenderness.  Musculoskeletal: Normal range of motion.  Normal pulses left foot.  Left lower extremity normal in size as compared to the right.. No rash noted on the left lower extremity.  Full range of motion of left ankle and left knee with some pain range of motion of the left hip.  Mild paralumbar tenderness without obvious spasm.  Tenderness in the left sciatic groove  Neurological: She is alert and oriented to person, place, and time.  Skin: Skin is warm and dry.  Psychiatric: She has a normal mood and affect. Judgment normal.  Nursing note and vitals reviewed.    ED Treatments / Results  Labs (all labs ordered are listed, but only abnormal results are displayed) Labs Reviewed - No data to display  EKG  EKG Interpretation None       Radiology Dg Chest 1 View  Result Date: 02/15/2017 CLINICAL DATA:  71 year old female with cough. L4 compression fracture diagnosed recently following back pain for nearly a week. EXAM: CHEST 1 VIEW COMPARISON:  Chest radiographs 07/04/2015. FINDINGS: Portable AP semi upright view at 1000 hours. Chronic but increased curvilinear opacity at both lung  bases most compatible with atelectasis. Elsewhere when allowing for portable technique the lungs are clear. Stable cardiac size and mediastinal contours. Visualized tracheal air column is within normal limits. Negative visible bowel gas pattern. IMPRESSION: Lung base atelectasis.  No other acute cardiopulmonary abnormality. Electronically Signed   By: Genevie Ann M.D.   On: 02/15/2017 10:26   US Venous Img Lower Unilateral Left  Result Date: 02/15/2017 CLINICAL DATA:  Lower extremity pain and edema EXAM: LEFT LOWER EXTREMITY VENOUS DUPLEX ULTRASOUND TECHNIQUE: Gray-scale sonography with graded compression, as well as color Doppler and duplex ultrasound were performed to evaluate the left lower extremity deep venous system from the level of the common femoral  vein and including the common femoral, femoral, profunda femoral, popliteal and calf veins including the posterior tibial, peroneal and gastrocnemius veins when visible. The superficial great saphenous vein was also interrogated. Spectral Doppler was utilized to evaluate flow at rest and with distal augmentation maneuvers in the common femoral, femoral and popliteal veins. COMPARISON:  None. FINDINGS: Contralateral Common Femoral Vein: Respiratory phasicity is normal and symmetric with the symptomatic side. No evidence of thrombus. Normal compressibility. Common Femoral Vein: No evidence of thrombus. Normal compressibility, respiratory phasicity and response to augmentation. Saphenofemoral Junction: No evidence of thrombus. Normal compressibility and flow on color Doppler imaging. Profunda Femoral Vein: No evidence of thrombus. Normal compressibility and flow on color Doppler imaging. Femoral Vein: No evidence of thrombus. Normal compressibility, respiratory phasicity and response to augmentation. Popliteal Vein: No evidence of thrombus. Normal compressibility, respiratory phasicity and response to augmentation. Calf Veins: No evidence of thrombus. Normal  compressibility and flow on color Doppler imaging. Superficial Great Saphenous Vein: No evidence of thrombus. Normal compressibility and flow on color Doppler imaging. Venous Reflux:  None. Other Findings:  None. IMPRESSION: No evidence of deep venous thrombosis in the left lower extremity. Right common femoral vein also patent. Electronically Signed   By: Lowella Grip III M.D.   On: 02/15/2017 09:18    Procedures Procedures (including critical care time)  Medications Ordered in ED Medications  predniSONE (DELTASONE) tablet 60 mg (60 mg Oral Given 02/17/17 0558)  morphine 4 MG/ML injection 8 mg (8 mg Intramuscular Given 02/17/17 0559)  ibuprofen (ADVIL,MOTRIN) tablet 600 mg (600 mg Oral Given 02/17/17 0559)     Initial Impression / Assessment and Plan / ED Course  I have reviewed the triage vital signs and the nursing notes.  Pertinent labs & imaging results that were available during my care of the patient were reviewed by me and considered in my medical decision making (see chart for details).     Feels much better after treatment of her pain here in emergency department.  Patient will be placed on oxycodone every 4 hours as well as anti-inflammatories and steroids and muscle relaxants.  Outpatient neurosurgical primary care follow-up.  Patient understands return the ER for new or worsening symptoms.  Final Clinical Impressions(s) / ED Diagnoses   Final diagnoses:  Acute back pain with sciatica, left    New Prescriptions Discharge Medication List as of 02/17/2017  8:03 AM    START taking these medications   Details  cyclobenzaprine (FLEXERIL) 10 MG tablet Take 1 tablet (10 mg total) by mouth 3 (three) times daily as needed for muscle spasms., Starting Sun 02/17/2017, Print    ibuprofen (ADVIL,MOTRIN) 400 MG tablet Take 1 tablet (400 mg total) by mouth every 8 (eight) hours as needed., Starting Sun 02/17/2017, Print    oxyCODONE (ROXICODONE) 5 MG immediate release tablet Take 1  tablet (5 mg total) by mouth every 4 (four) hours as needed for severe pain., Starting Sun 02/17/2017, Print    predniSONE (DELTASONE) 20 MG tablet Take 2 tablets (40 mg total) by mouth daily., Starting Sun 02/17/2017, Until Fri 02/22/2017, Print         Jola Schmidt, MD 02/17/17 [phone]

## 2017-02-17 NOTE — ED Notes (Signed)
Patient transported to X-ray 

## 2017-02-17 NOTE — ED Triage Notes (Signed)
Pt c/o back pain that radiates to the left leg. Left leg swollen as well, pt reports that swelling goes down when she elevates it. Pt has been seen at [facility] and by PCP for same issue.

## 2017-02-18 ENCOUNTER — Other Ambulatory Visit: Payer: Self-pay | Admitting: *Deleted

## 2017-02-18 DIAGNOSIS — M5126 Other intervertebral disc displacement, lumbar region: Secondary | ICD-10-CM

## 2017-02-18 DIAGNOSIS — M5136 Other intervertebral disc degeneration, lumbar region: Secondary | ICD-10-CM

## 2017-02-18 DIAGNOSIS — S32040A Wedge compression fracture of fourth lumbar vertebra, initial encounter for closed fracture: Secondary | ICD-10-CM

## 2017-02-18 MED FILL — Oxycodone w/ Acetaminophen Tab 5-325 MG: ORAL | Qty: 6 | Status: AC

## 2017-02-18 MED FILL — Ondansetron HCl Tab 4 MG: ORAL | Qty: 4 | Status: AC

## 2017-02-19 ENCOUNTER — Other Ambulatory Visit: Payer: Self-pay | Admitting: *Deleted

## 2017-02-19 ENCOUNTER — Telehealth: Payer: Self-pay | Admitting: *Deleted

## 2017-02-19 MED ORDER — IBUPROFEN 400 MG PO TABS: 400.0000 mg | ORAL_TABLET | Freq: Three times a day (TID) | ORAL | 0 refills | Status: DC | PRN

## 2017-02-19 MED ORDER — PREDNISONE 20 MG PO TABS: 40.0000 mg | ORAL_TABLET | Freq: Every day | ORAL | 0 refills | Status: AC

## 2017-02-19 MED ORDER — PREDNISONE 20 MG PO TABS: 40.0000 mg | ORAL_TABLET | Freq: Every day | ORAL | 0 refills | Status: DC

## 2017-02-19 MED ORDER — CYCLOBENZAPRINE HCL 10 MG PO TABS: 10.0000 mg | ORAL_TABLET | Freq: Three times a day (TID) | ORAL | 0 refills | Status: DC | PRN

## 2017-02-19 NOTE — Telephone Encounter (Signed)
Ok to fill medication other than oxycodone.  Per Chevis Pretty, FNP Daughter aware that it is office policy that patient will need to be seen in this office for narcotic refill. Daughter verbalizes understanding.

## 2017-02-19 NOTE — Telephone Encounter (Signed)
Daughter aware that patient would have to be seen.

## 2017-02-19 NOTE — Telephone Encounter (Signed)
Patient's daughter Mardene Celeste called stating that patient is suppose to see neurosurgeon on Monday 10/15 for compression fracture of lumbar spine and bulging disc.  Patient was prescribed oxycodone, ibuprofen, flexeril and prednisone in the ER and will only have enough medication to last her until Friday.  Patient will need medication to get her through Monday and would like a refill on these medications until she can be seen by the neurosurgeon

## 2017-02-19 NOTE — Telephone Encounter (Signed)
She will need to be seen to get pain medication

## 2017-02-23 ENCOUNTER — Other Ambulatory Visit: Payer: Medicare Other

## 2017-02-25 DIAGNOSIS — S32040A Wedge compression fracture of fourth lumbar vertebra, initial encounter for closed fracture: Secondary | ICD-10-CM | POA: Diagnosis not present

## 2017-02-25 DIAGNOSIS — M5126 Other intervertebral disc displacement, lumbar region: Secondary | ICD-10-CM | POA: Diagnosis not present

## 2017-03-08 DIAGNOSIS — M5126 Other intervertebral disc displacement, lumbar region: Secondary | ICD-10-CM | POA: Diagnosis not present

## 2017-03-08 DIAGNOSIS — M5416 Radiculopathy, lumbar region: Secondary | ICD-10-CM | POA: Diagnosis not present

## 2017-03-26 DIAGNOSIS — M5126 Other intervertebral disc displacement, lumbar region: Secondary | ICD-10-CM | POA: Diagnosis not present

## 2017-03-26 DIAGNOSIS — I10 Essential (primary) hypertension: Secondary | ICD-10-CM | POA: Diagnosis not present

## 2017-03-26 DIAGNOSIS — Z6832 Body mass index (BMI) 32.0-32.9, adult: Secondary | ICD-10-CM | POA: Diagnosis not present

## 2017-04-10 DIAGNOSIS — M5126 Other intervertebral disc displacement, lumbar region: Secondary | ICD-10-CM | POA: Diagnosis not present

## 2017-04-23 DIAGNOSIS — M5126 Other intervertebral disc displacement, lumbar region: Secondary | ICD-10-CM | POA: Diagnosis not present

## 2017-04-24 ENCOUNTER — Other Ambulatory Visit: Payer: Self-pay | Admitting: Neurosurgery

## 2017-04-30 ENCOUNTER — Other Ambulatory Visit: Payer: Self-pay

## 2017-04-30 ENCOUNTER — Encounter (HOSPITAL_COMMUNITY): Payer: Self-pay | Admitting: *Deleted

## 2017-04-30 NOTE — Progress Notes (Signed)
Pt denies SOB, chest pain, and being under the care of a cardiologist. Pt denies having a stress test, echo, and cardiac cath. Pt denies having an EKG within the last year. Pt made aware to stop taking  Aspirin, vitamins, fish oil and herbal medications. Do not take any NSAIDs ie: Ibuprofen, Advil, Naproxen (Aleve), Motrin, BC and Goody Powder or any medication containing Aspirin. Pt verbalized understanding of al pre-op instructions.

## 2017-05-01 ENCOUNTER — Ambulatory Visit (HOSPITAL_COMMUNITY): Payer: Medicare Other | Admitting: Certified Registered Nurse Anesthetist

## 2017-05-01 ENCOUNTER — Encounter (HOSPITAL_COMMUNITY): Payer: Self-pay | Admitting: Urology

## 2017-05-01 ENCOUNTER — Encounter (HOSPITAL_COMMUNITY)
Admission: RE | Disposition: A | Discharge status: Home / Self Care | Payer: Self-pay | Source: Ambulatory Visit | Attending: Neurosurgery

## 2017-05-01 ENCOUNTER — Ambulatory Visit (HOSPITAL_COMMUNITY): Payer: Medicare Other

## 2017-05-01 ENCOUNTER — Ambulatory Visit (HOSPITAL_COMMUNITY)
Admission: RE | Admit: 2017-05-01 | Discharge: 2017-05-02 | Disposition: A | Discharge status: Home / Self Care | Payer: Medicare Other | Source: Ambulatory Visit | Attending: Neurosurgery | Admitting: Neurosurgery

## 2017-05-01 DIAGNOSIS — E7800 Pure hypercholesterolemia, unspecified: Secondary | ICD-10-CM | POA: Diagnosis not present

## 2017-05-01 DIAGNOSIS — E785 Hyperlipidemia, unspecified: Secondary | ICD-10-CM | POA: Diagnosis not present

## 2017-05-01 DIAGNOSIS — M5126 Other intervertebral disc displacement, lumbar region: Secondary | ICD-10-CM | POA: Diagnosis not present

## 2017-05-01 DIAGNOSIS — M48061 Spinal stenosis, lumbar region without neurogenic claudication: Secondary | ICD-10-CM | POA: Insufficient documentation

## 2017-05-01 DIAGNOSIS — M47814 Spondylosis without myelopathy or radiculopathy, thoracic region: Secondary | ICD-10-CM | POA: Diagnosis not present

## 2017-05-01 DIAGNOSIS — M5116 Intervertebral disc disorders with radiculopathy, lumbar region: Secondary | ICD-10-CM | POA: Insufficient documentation

## 2017-05-01 DIAGNOSIS — K219 Gastro-esophageal reflux disease without esophagitis: Secondary | ICD-10-CM | POA: Diagnosis not present

## 2017-05-01 DIAGNOSIS — I10 Essential (primary) hypertension: Secondary | ICD-10-CM | POA: Diagnosis not present

## 2017-05-01 DIAGNOSIS — Z419 Encounter for procedure for purposes other than remedying health state, unspecified: Secondary | ICD-10-CM

## 2017-05-01 DIAGNOSIS — Z7982 Long term (current) use of aspirin: Secondary | ICD-10-CM | POA: Diagnosis not present

## 2017-05-01 DIAGNOSIS — Z79899 Other long term (current) drug therapy: Secondary | ICD-10-CM | POA: Diagnosis not present

## 2017-05-01 HISTORY — PX: LUMBAR LAMINECTOMY/DECOMPRESSION MICRODISCECTOMY: SHX5026

## 2017-05-01 HISTORY — DX: Other intervertebral disc displacement, lumbar region: M51.26

## 2017-05-01 HISTORY — DX: Unspecified osteoarthritis, unspecified site: M19.90

## 2017-05-01 LAB — CBC
HCT: 43.1 % (ref 36.0–46.0)
HEMOGLOBIN: 13.8 g/dL (ref 12.0–15.0)
MCH: 31.9 pg (ref 26.0–34.0)
MCHC: 32 g/dL (ref 30.0–36.0)
MCV: 99.8 fL (ref 78.0–100.0)
PLATELETS: 331 10*3/uL (ref 150–400)
RBC: 4.32 MIL/uL (ref 3.87–5.11)
RDW: 13.7 % (ref 11.5–15.5)
WBC: 9.6 10*3/uL (ref 4.0–10.5)

## 2017-05-01 LAB — BASIC METABOLIC PANEL
ANION GAP: 10 (ref 5–15)
BUN: 24 mg/dL — AB (ref 6–20)
CHLORIDE: 101 mmol/L (ref 101–111)
CO2: 24 mmol/L (ref 22–32)
Calcium: 9.2 mg/dL (ref 8.9–10.3)
Creatinine, Ser: 1.03 mg/dL — ABNORMAL HIGH (ref 0.44–1.00)
GFR, EST NON AFRICAN AMERICAN: 53 mL/min — AB (ref >60)
Glucose, Bld: 96 mg/dL (ref 65–99)
POTASSIUM: 4.6 mmol/L (ref 3.5–5.1)
SODIUM: 135 mmol/L (ref 135–145)

## 2017-05-01 SURGERY — LUMBAR LAMINECTOMY/DECOMPRESSION MICRODISCECTOMY 1 LEVEL
Anesthesia: General | Site: Back | Laterality: Left

## 2017-05-01 MED ORDER — ADULT MULTIVITAMIN W/MINERALS CH
1.0000 | ORAL_TABLET | Freq: Every day | ORAL | Status: DC
  Administered 2017-05-02: 1 via ORAL
  Filled 2017-05-01: qty 1

## 2017-05-01 MED ORDER — 0.9 % SODIUM CHLORIDE (POUR BTL) OPTIME
TOPICAL | Status: DC | PRN
  Administered 2017-05-01: 1000 mL

## 2017-05-01 MED ORDER — PROPOFOL 10 MG/ML IV BOLUS
INTRAVENOUS | Status: DC | PRN
  Administered 2017-05-01: 100 mg via INTRAVENOUS

## 2017-05-01 MED ORDER — SODIUM CHLORIDE 0.9% FLUSH: 3.0000 mL | INTRAVENOUS | Status: DC | PRN

## 2017-05-01 MED ORDER — ONDANSETRON HCL 4 MG PO TABS: 4.0000 mg | ORAL_TABLET | Freq: Four times a day (QID) | ORAL | Status: DC

## 2017-05-01 MED ORDER — BUPIVACAINE HCL (PF) 0.25 % IJ SOLN
INTRAMUSCULAR | Status: AC
  Filled 2017-05-01: qty 30

## 2017-05-01 MED ORDER — OXYCODONE HCL 5 MG PO TABS: 5.0000 mg | ORAL_TABLET | ORAL | Status: DC | PRN

## 2017-05-01 MED ORDER — LISINOPRIL 10 MG PO TABS
10.0000 mg | ORAL_TABLET | Freq: Every day | ORAL | Status: DC
  Administered 2017-05-01 – 2017-05-02 (×2): 10 mg via ORAL
  Filled 2017-05-01 (×2): qty 1

## 2017-05-01 MED ORDER — PHENYLEPHRINE 40 MCG/ML (10ML) SYRINGE FOR IV PUSH (FOR BLOOD PRESSURE SUPPORT)
PREFILLED_SYRINGE | INTRAVENOUS | Status: DC | PRN
  Administered 2017-05-01: 80 ug via INTRAVENOUS

## 2017-05-01 MED ORDER — CHLORHEXIDINE GLUCONATE CLOTH 2 % EX PADS: 6.0000 | MEDICATED_PAD | Freq: Once | CUTANEOUS | Status: DC

## 2017-05-01 MED ORDER — ACETAMINOPHEN 650 MG RE SUPP: 650.0000 mg | RECTAL | Status: DC | PRN

## 2017-05-01 MED ORDER — SODIUM CHLORIDE 0.9 % IV SOLN: 250.0000 mL | INTRAVENOUS | Status: DC

## 2017-05-01 MED ORDER — MENTHOL 3 MG MT LOZG: 1.0000 | LOZENGE | OROMUCOSAL | Status: DC | PRN

## 2017-05-01 MED ORDER — LIDOCAINE 2% (20 MG/ML) 5 ML SYRINGE
INTRAMUSCULAR | Status: AC
  Filled 2017-05-01: qty 10

## 2017-05-01 MED ORDER — FENTANYL CITRATE (PF) 100 MCG/2ML IJ SOLN
25.0000 ug | INTRAMUSCULAR | Status: DC | PRN
  Administered 2017-05-01 (×2): 50 ug via INTRAVENOUS

## 2017-05-01 MED ORDER — PHENYLEPHRINE 40 MCG/ML (10ML) SYRINGE FOR IV PUSH (FOR BLOOD PRESSURE SUPPORT)
PREFILLED_SYRINGE | INTRAVENOUS | Status: AC
  Filled 2017-05-01: qty 10

## 2017-05-01 MED ORDER — LIDOCAINE 2% (20 MG/ML) 5 ML SYRINGE
INTRAMUSCULAR | Status: DC | PRN
  Administered 2017-05-01: 60 mg via INTRAVENOUS

## 2017-05-01 MED ORDER — PANTOPRAZOLE SODIUM 40 MG IV SOLR: 40.0000 mg | Freq: Every day | INTRAVENOUS | Status: DC

## 2017-05-01 MED ORDER — ONDANSETRON HCL 4 MG PO TABS: 4.0000 mg | ORAL_TABLET | Freq: Four times a day (QID) | ORAL | Status: DC | PRN

## 2017-05-01 MED ORDER — LIDOCAINE-EPINEPHRINE 1 %-1:100000 IJ SOLN
INTRAMUSCULAR | Status: AC
  Filled 2017-05-01: qty 1

## 2017-05-01 MED ORDER — SODIUM CHLORIDE 0.9 % IR SOLN
Status: DC | PRN
  Administered 2017-05-01: 500 mL

## 2017-05-01 MED ORDER — PHENYLEPHRINE HCL 10 MG/ML IJ SOLN
INTRAVENOUS | Status: DC | PRN
  Administered 2017-05-01: 20 ug/min via INTRAVENOUS

## 2017-05-01 MED ORDER — ALENDRONATE SODIUM 70 MG PO TABS: 70.0000 mg | ORAL_TABLET | ORAL | Status: DC

## 2017-05-01 MED ORDER — EPHEDRINE 5 MG/ML INJ
INTRAVENOUS | Status: AC
  Filled 2017-05-01: qty 10

## 2017-05-01 MED ORDER — ATORVASTATIN CALCIUM 20 MG PO TABS
10.0000 mg | ORAL_TABLET | Freq: Every day | ORAL | Status: DC
  Administered 2017-05-01: 10 mg via ORAL
  Filled 2017-05-01: qty 1

## 2017-05-01 MED ORDER — PHENOL 1.4 % MT LIQD: 1.0000 | OROMUCOSAL | Status: DC | PRN

## 2017-05-01 MED ORDER — SUGAMMADEX SODIUM 200 MG/2ML IV SOLN
INTRAVENOUS | Status: AC
  Filled 2017-05-01: qty 2

## 2017-05-01 MED ORDER — SUGAMMADEX SODIUM 500 MG/5ML IV SOLN
INTRAVENOUS | Status: AC
  Filled 2017-05-01: qty 5

## 2017-05-01 MED ORDER — HYDROCODONE-ACETAMINOPHEN 7.5-325 MG PO TABS
2.0000 | ORAL_TABLET | ORAL | Status: DC | PRN
  Administered 2017-05-01 – 2017-05-02 (×4): 2 via ORAL
  Filled 2017-05-01 (×4): qty 2

## 2017-05-01 MED ORDER — CYCLOBENZAPRINE HCL 10 MG PO TABS
ORAL_TABLET | ORAL | Status: AC
  Filled 2017-05-01: qty 1

## 2017-05-01 MED ORDER — LISINOPRIL-HYDROCHLOROTHIAZIDE 20-12.5 MG PO TABS: 0.5000 | ORAL_TABLET | Freq: Every day | ORAL | Status: DC

## 2017-05-01 MED ORDER — FLUTICASONE PROPIONATE 50 MCG/ACT NA SUSP
2.0000 | Freq: Every day | NASAL | Status: DC
  Administered 2017-05-02: 2 via NASAL
  Filled 2017-05-01: qty 16

## 2017-05-01 MED ORDER — CEFAZOLIN SODIUM-DEXTROSE 2-4 GM/100ML-% IV SOLN
2.0000 g | Freq: Three times a day (TID) | INTRAVENOUS | Status: AC
  Administered 2017-05-01 – 2017-05-02 (×2): 2 g via INTRAVENOUS
  Filled 2017-05-01 (×2): qty 100

## 2017-05-01 MED ORDER — ASPIRIN 81 MG PO CHEW
81.0000 mg | CHEWABLE_TABLET | Freq: Every day | ORAL | Status: DC
  Administered 2017-05-02: 81 mg via ORAL
  Filled 2017-05-01: qty 1

## 2017-05-01 MED ORDER — PANTOPRAZOLE SODIUM 40 MG PO TBEC
40.0000 mg | DELAYED_RELEASE_TABLET | Freq: Every day | ORAL | Status: DC
  Administered 2017-05-02: 40 mg via ORAL
  Filled 2017-05-01: qty 1

## 2017-05-01 MED ORDER — VITAMIN C 500 MG PO TABS
500.0000 mg | ORAL_TABLET | Freq: Every day | ORAL | Status: DC
  Administered 2017-05-02: 500 mg via ORAL
  Filled 2017-05-01: qty 1

## 2017-05-01 MED ORDER — ONDANSETRON HCL 4 MG/2ML IJ SOLN
INTRAMUSCULAR | Status: DC | PRN
  Administered 2017-05-01: 4 mg via INTRAVENOUS

## 2017-05-01 MED ORDER — OXYCODONE HCL 5 MG/5ML PO SOLN: 5.0000 mg | Freq: Once | ORAL | Status: AC | PRN

## 2017-05-01 MED ORDER — CEFAZOLIN SODIUM-DEXTROSE 2-4 GM/100ML-% IV SOLN
2.0000 g | INTRAVENOUS | Status: AC
  Administered 2017-05-01: 2 g via INTRAVENOUS

## 2017-05-01 MED ORDER — IBUPROFEN 200 MG PO TABS
400.0000 mg | ORAL_TABLET | Freq: Three times a day (TID) | ORAL | Status: DC | PRN
  Administered 2017-05-02: 400 mg via ORAL
  Filled 2017-05-01: qty 2

## 2017-05-01 MED ORDER — HYDROCHLOROTHIAZIDE 10 MG/ML ORAL SUSPENSION
6.2500 mg | Freq: Every day | ORAL | Status: DC
  Administered 2017-05-01 – 2017-05-02 (×2): 6.25 mg via ORAL
  Filled 2017-05-01 (×2): qty 1.25

## 2017-05-01 MED ORDER — PANTOPRAZOLE SODIUM 40 MG PO TBEC: 40.0000 mg | DELAYED_RELEASE_TABLET | Freq: Every day | ORAL | Status: DC

## 2017-05-01 MED ORDER — LACTATED RINGERS IV SOLN
INTRAVENOUS | Status: DC
  Administered 2017-05-01: 12:00:00 via INTRAVENOUS

## 2017-05-01 MED ORDER — OXYCODONE HCL 5 MG PO TABS
5.0000 mg | ORAL_TABLET | Freq: Once | ORAL | Status: AC | PRN
  Administered 2017-05-01: 5 mg via ORAL

## 2017-05-01 MED ORDER — ROCURONIUM BROMIDE 10 MG/ML (PF) SYRINGE
PREFILLED_SYRINGE | INTRAVENOUS | Status: AC
  Filled 2017-05-01: qty 10

## 2017-05-01 MED ORDER — THROMBIN (RECOMBINANT) 5000 UNITS EX SOLR
CUTANEOUS | Status: AC
  Filled 2017-05-01: qty 5000

## 2017-05-01 MED ORDER — ACETAMINOPHEN 325 MG PO TABS: 650.0000 mg | ORAL_TABLET | ORAL | Status: DC | PRN

## 2017-05-01 MED ORDER — ONDANSETRON HCL 4 MG/2ML IJ SOLN: 4.0000 mg | Freq: Four times a day (QID) | INTRAMUSCULAR | Status: DC | PRN

## 2017-05-01 MED ORDER — ALUM & MAG HYDROXIDE-SIMETH 200-200-20 MG/5ML PO SUSP: 30.0000 mL | Freq: Four times a day (QID) | ORAL | Status: DC | PRN

## 2017-05-01 MED ORDER — LIDOCAINE-EPINEPHRINE 1 %-1:100000 IJ SOLN
INTRAMUSCULAR | Status: DC | PRN
  Administered 2017-05-01: 4 mL

## 2017-05-01 MED ORDER — CYCLOBENZAPRINE HCL 10 MG PO TABS
10.0000 mg | ORAL_TABLET | Freq: Three times a day (TID) | ORAL | Status: DC | PRN
  Administered 2017-05-01: 10 mg via ORAL

## 2017-05-01 MED ORDER — BUPIVACAINE HCL (PF) 0.25 % IJ SOLN
INTRAMUSCULAR | Status: DC | PRN
Start: 2017-05-01 — End: 2017-05-01
  Administered 2017-05-01: 4 mL

## 2017-05-01 MED ORDER — THROMBIN (RECOMBINANT) 5000 UNITS EX SOLR
CUTANEOUS | Status: DC | PRN
  Administered 2017-05-01: 5 mL via TOPICAL

## 2017-05-01 MED ORDER — SODIUM CHLORIDE 0.9% FLUSH: 3.0000 mL | Freq: Two times a day (BID) | INTRAVENOUS | Status: DC

## 2017-05-01 MED ORDER — PROPOFOL 10 MG/ML IV BOLUS
INTRAVENOUS | Status: AC
  Filled 2017-05-01: qty 20

## 2017-05-01 MED ORDER — FENTANYL CITRATE (PF) 250 MCG/5ML IJ SOLN
INTRAMUSCULAR | Status: DC | PRN
  Administered 2017-05-01: 50 ug via INTRAVENOUS
  Administered 2017-05-01: 100 ug via INTRAVENOUS

## 2017-05-01 MED ORDER — ONDANSETRON HCL 4 MG/2ML IJ SOLN
INTRAMUSCULAR | Status: AC
  Filled 2017-05-01: qty 6

## 2017-05-01 MED ORDER — IBUPROFEN 200 MG PO TABS: 400.0000 mg | ORAL_TABLET | Freq: Three times a day (TID) | ORAL | Status: DC | PRN

## 2017-05-01 MED ORDER — DEXAMETHASONE SODIUM PHOSPHATE 10 MG/ML IJ SOLN
10.0000 mg | INTRAMUSCULAR | Status: AC
  Administered 2017-05-01: 10 mg via INTRAVENOUS

## 2017-05-01 MED ORDER — OXYCODONE HCL 5 MG PO TABS
ORAL_TABLET | ORAL | Status: AC
  Filled 2017-05-01: qty 1

## 2017-05-01 MED ORDER — CEFAZOLIN SODIUM-DEXTROSE 2-4 GM/100ML-% IV SOLN
INTRAVENOUS | Status: AC
  Filled 2017-05-01: qty 100

## 2017-05-01 MED ORDER — CYCLOBENZAPRINE HCL 10 MG PO TABS: 10.0000 mg | ORAL_TABLET | Freq: Three times a day (TID) | ORAL | Status: DC | PRN

## 2017-05-01 MED ORDER — SUGAMMADEX SODIUM 200 MG/2ML IV SOLN
INTRAVENOUS | Status: DC | PRN
  Administered 2017-05-01: 200 mg via INTRAVENOUS
  Administered 2017-05-01: 100 mg via INTRAVENOUS

## 2017-05-01 MED ORDER — FENTANYL CITRATE (PF) 100 MCG/2ML IJ SOLN
INTRAMUSCULAR | Status: AC
  Administered 2017-05-01: 50 ug via INTRAVENOUS
  Filled 2017-05-01: qty 2

## 2017-05-01 MED ORDER — DICLOFENAC SODIUM 1 % TD GEL
2.0000 g | Freq: Four times a day (QID) | TRANSDERMAL | Status: DC
  Filled 2017-05-01: qty 100

## 2017-05-01 MED ORDER — FENTANYL CITRATE (PF) 250 MCG/5ML IJ SOLN
INTRAMUSCULAR | Status: AC
  Filled 2017-05-01: qty 5

## 2017-05-01 MED ORDER — DEXAMETHASONE SODIUM PHOSPHATE 10 MG/ML IJ SOLN
INTRAMUSCULAR | Status: AC
  Filled 2017-05-01: qty 3

## 2017-05-01 MED ORDER — ROCURONIUM BROMIDE 10 MG/ML (PF) SYRINGE
PREFILLED_SYRINGE | INTRAVENOUS | Status: DC | PRN
  Administered 2017-05-01: 10 mg via INTRAVENOUS
  Administered 2017-05-01: 40 mg via INTRAVENOUS

## 2017-05-01 MED ORDER — HYDROMORPHONE HCL 1 MG/ML IJ SOLN: 0.5000 mg | INTRAMUSCULAR | Status: DC | PRN

## 2017-05-01 SURGICAL SUPPLY — 55 items
BAG DECANTER FOR FLEXI CONT (MISCELLANEOUS) ×3 IMPLANT
BENZOIN TINCTURE PRP APPL 2/3 (GAUZE/BANDAGES/DRESSINGS) ×3 IMPLANT
BLADE CLIPPER SURG (BLADE) IMPLANT
BLADE SURG 11 STRL SS (BLADE) ×3 IMPLANT
BUR CUTTER 7.0 ROUND (BURR) ×3 IMPLANT
BUR MATCHSTICK NEURO 3.0 LAGG (BURR) ×3 IMPLANT
CANISTER SUCT 3000ML PPV (MISCELLANEOUS) ×3 IMPLANT
CARTRIDGE OIL MAESTRO DRILL (MISCELLANEOUS) ×1 IMPLANT
CLOSURE WOUND 1/2 X4 (GAUZE/BANDAGES/DRESSINGS) ×1
DECANTER SPIKE VIAL GLASS SM (MISCELLANEOUS) ×3 IMPLANT
DERMABOND ADVANCED (GAUZE/BANDAGES/DRESSINGS) ×2
DERMABOND ADVANCED .7 DNX12 (GAUZE/BANDAGES/DRESSINGS) ×1 IMPLANT
DIFFUSER DRILL AIR PNEUMATIC (MISCELLANEOUS) ×3 IMPLANT
DRAPE HALF SHEET 40X57 (DRAPES) IMPLANT
DRAPE LAPAROTOMY 100X72X124 (DRAPES) ×3 IMPLANT
DRAPE MICROSCOPE LEICA (MISCELLANEOUS) ×3 IMPLANT
DRAPE POUCH INSTRU U-SHP 10X18 (DRAPES) ×3 IMPLANT
DRAPE SURG 17X23 STRL (DRAPES) ×3 IMPLANT
DRSG OPSITE POSTOP 3X4 (GAUZE/BANDAGES/DRESSINGS) ×3 IMPLANT
DURAPREP 26ML APPLICATOR (WOUND CARE) ×3 IMPLANT
ELECT REM PT RETURN 9FT ADLT (ELECTROSURGICAL) ×3
ELECTRODE REM PT RTRN 9FT ADLT (ELECTROSURGICAL) ×1 IMPLANT
GAUZE SPONGE 4X4 12PLY STRL (GAUZE/BANDAGES/DRESSINGS) ×3 IMPLANT
GAUZE SPONGE 4X4 16PLY XRAY LF (GAUZE/BANDAGES/DRESSINGS) IMPLANT
GLOVE BIO SURGEON STRL SZ7 (GLOVE) ×6 IMPLANT
GLOVE BIO SURGEON STRL SZ8 (GLOVE) ×6 IMPLANT
GLOVE BIOGEL PI IND STRL 7.0 (GLOVE) IMPLANT
GLOVE BIOGEL PI INDICATOR 7.0 (GLOVE)
GLOVE EXAM NITRILE LRG STRL (GLOVE) IMPLANT
GLOVE EXAM NITRILE XL STR (GLOVE) IMPLANT
GLOVE EXAM NITRILE XS STR PU (GLOVE) IMPLANT
GLOVE INDICATOR 8.5 STRL (GLOVE) ×3 IMPLANT
GOWN STRL REUS W/ TWL LRG LVL3 (GOWN DISPOSABLE) ×1 IMPLANT
GOWN STRL REUS W/ TWL XL LVL3 (GOWN DISPOSABLE) ×2 IMPLANT
GOWN STRL REUS W/TWL 2XL LVL3 (GOWN DISPOSABLE) IMPLANT
GOWN STRL REUS W/TWL LRG LVL3 (GOWN DISPOSABLE) ×2
GOWN STRL REUS W/TWL XL LVL3 (GOWN DISPOSABLE) ×4
HEMOSTAT POWDER KIT SURGIFOAM (HEMOSTASIS) ×3 IMPLANT
KIT BASIN OR (CUSTOM PROCEDURE TRAY) ×3 IMPLANT
KIT ROOM TURNOVER OR (KITS) ×3 IMPLANT
NEEDLE HYPO 22GX1.5 SAFETY (NEEDLE) ×3 IMPLANT
NEEDLE SPNL 22GX3.5 QUINCKE BK (NEEDLE) ×3 IMPLANT
NS IRRIG 1000ML POUR BTL (IV SOLUTION) ×3 IMPLANT
OIL CARTRIDGE MAESTRO DRILL (MISCELLANEOUS) ×3
PACK LAMINECTOMY NEURO (CUSTOM PROCEDURE TRAY) ×3 IMPLANT
RUBBERBAND STERILE (MISCELLANEOUS) ×6 IMPLANT
SPONGE SURGIFOAM ABS GEL SZ50 (HEMOSTASIS) IMPLANT
STRIP CLOSURE SKIN 1/2X4 (GAUZE/BANDAGES/DRESSINGS) ×2 IMPLANT
SUT VIC AB 0 CT1 18XCR BRD8 (SUTURE) ×1 IMPLANT
SUT VIC AB 0 CT1 8-18 (SUTURE) ×2
SUT VIC AB 2-0 CT1 18 (SUTURE) ×3 IMPLANT
SUT VICRYL 4-0 PS2 18IN ABS (SUTURE) ×3 IMPLANT
TOWEL GREEN STERILE (TOWEL DISPOSABLE) ×3 IMPLANT
TOWEL GREEN STERILE FF (TOWEL DISPOSABLE) ×3 IMPLANT
WATER STERILE IRR 1000ML POUR (IV SOLUTION) ×3 IMPLANT

## 2017-05-01 NOTE — Anesthesia Preprocedure Evaluation (Addendum)
Anesthesia Evaluation  Patient identified by MRN, date of birth, ID band Patient awake    Reviewed: Allergy & Precautions, H&P , NPO status , Patient's Chart, lab work & pertinent test results  Airway Mallampati: II   Neck ROM: full    Dental  (+) Dental Advisory Given, Teeth Intact   Pulmonary neg pulmonary ROS,    breath sounds clear to auscultation       Cardiovascular hypertension,  Rhythm:regular Rate:Normal     Neuro/Psych    GI/Hepatic GERD  ,  Endo/Other    Renal/GU      Musculoskeletal  (+) Arthritis ,   Abdominal   Peds  Hematology   Anesthesia Other Findings   Reproductive/Obstetrics                            Anesthesia Physical Anesthesia Plan  ASA: II  Anesthesia Plan: General   Post-op Pain Management:    Induction: Intravenous  PONV Risk Score and Plan: 3 and Ondansetron, Dexamethasone and Treatment may vary due to age or medical condition  Airway Management Planned: Oral ETT  Additional Equipment:   Intra-op Plan:   Post-operative Plan: Extubation in OR  Informed Consent: I have reviewed the patients History and Physical, chart, labs and discussed the procedure including the risks, benefits and alternatives for the proposed anesthesia with the patient or authorized representative who has indicated his/her understanding and acceptance.   Dental advisory given  Plan Discussed with: CRNA, Anesthesiologist and Surgeon  Anesthesia Plan Comments:        Anesthesia Quick Evaluation

## 2017-05-01 NOTE — Transfer of Care (Signed)
Immediate Anesthesia Transfer of Care Note  Patient: Cheryl Lee  Procedure(s) Performed: Microdiscectomy - Lumbar four-five  - left (Left Back)  Patient Location: PACU  Anesthesia Type:General  Level of Consciousness: awake, alert , oriented and patient cooperative  Airway & Oxygen Therapy: Patient Spontanous Breathing  Post-op Assessment: Report given to RN, Post -op Vital signs reviewed and stable and Patient moving all extremities X 4  Post vital signs: Reviewed and stable  Last Vitals:  Vitals:   05/01/17 1200 05/01/17 1737  BP:    Pulse: 99   Resp:    Temp:  37.2 C  SpO2:      Last Pain:  Vitals:   05/01/17 1152  TempSrc:   PainSc: 3       Patients Stated Pain Goal: 3 (93/81/01 7510)  Complications: No apparent anesthesia complications

## 2017-05-01 NOTE — H&P (Signed)
Cheryl Lee is an 71 y.o. female.   Chief Complaint: Back and left leg pain  HPI: [name]. Workup revealed a disc herniation L4-5 on the left. Due to patient's failed conservative treatment imaging findings and progressive clinical syndrome I recommended limiting microscopic discectomy on the left at L4-5. I extensively went over the risks and benefits of the operation with the patient as well as perioperative course expectations of outcome and alternatives of surgery and she understood and agreed to proceed forward.   Past Medical History:  Diagnosis Date  . Acid reflux   . Arthritis   . HNP (herniated nucleus pulposus), lumbar   . Hypercholesteremia   . Hypertension   . Osteoporosis   . Sinus congestion     Past Surgical History:  Procedure Laterality Date  . CATARACT EXTRACTION    . COLONOSCOPY    . Right snkle repair      Family History  Problem Relation Age of Onset  . Cancer Mother   . Alzheimer's disease Mother   . Heart disease Mother   . Early death Father   . Diabetes Sister   . Diabetes Sister   . Hypertension Sister    Social History:  reports that  has never smoked. she has never used smokeless tobacco. She reports that she does not drink alcohol or use drugs.  Allergies:  Allergies  Allergen Reactions  . Lycopene Itching and Rash    Medications Prior to Admission  Medication Sig Dispense Refill  . atorvastatin (LIPITOR) 10 MG tablet Take 1 tablet (10 mg total) by mouth daily. 90 tablet 1  . fluticasone (FLONASE) 50 MCG/ACT nasal spray Place 2 sprays into both nostrils daily. 48 g 1  . lisinopril-hydrochlorothiazide (PRINZIDE,ZESTORETIC) 20-12.5 MG tablet Take 0.5 tablets by mouth daily. 45 tablet 1  . Multiple Vitamins-Minerals (MULTIVITAMINS THER. W/MINERALS) TABS Take 0.5 tablets by mouth 2 (two) times daily.     Marland Kitchen omeprazole (PRILOSEC) 20 MG capsule Take 1  capsule (20 mg total) by mouth daily. 90 capsule 1  . oxyCODONE (ROXICODONE) 5 MG immediate release tablet Take 1 tablet (5 mg total) by mouth every 4 (four) hours as needed for severe pain. 20 tablet 0  . vitamin C (ASCORBIC ACID) 500 MG tablet Take 500 mg by mouth daily.      Marland Kitchen alendronate (FOSAMAX) 70 MG tablet Take 1 tablet (70 mg total) by mouth every 7 (seven) days. Take with a full glass of water on an empty stomach. (Patient taking differently: Take 70 mg by mouth every Monday. Take with a full glass of water on an empty stomach.) 12 tablet 1  . aspirin 81 MG tablet Take 81 mg by mouth daily.    . cyclobenzaprine (FLEXERIL) 10 MG tablet Take 1 tablet (10 mg total) by mouth 3 (three) times daily as needed for muscle spasms. 12 tablet 0  . diclofenac sodium (VOLTAREN) 1 % GEL Apply 2 g topically 4 (four) times daily. (Patient not taking: Reported on 02/11/2017) 5 Tube 1  . ibuprofen (ADVIL,MOTRIN) 400 MG tablet Take 1 tablet (400 mg total) by mouth every 8 (eight) hours as needed. 15 tablet 0  . ondansetron (ZOFRAN) 4 MG tablet Take 1 tablet (4 mg total) by mouth every 6 (six) hours. 4 tablet 0    Results for orders placed or performed during the hospital encounter of 05/01/17 (from the past 48 hour(s))  CBC     Status: None   Collection Time: 05/01/17 12:04 PM  Result Value Ref Range   WBC 9.6 4.0 - 10.5 K/uL   RBC 4.32 3.87 - 5.11 MIL/uL   Hemoglobin 13.8 12.0 - 15.0 g/dL   HCT 43.1 36.0 - 46.0 %   MCV 99.8 78.0 - 100.0 fL   MCH 31.9 26.0 - 34.0 pg   MCHC 32.0 30.0 - 36.0 g/dL   RDW 13.7 11.5 - 15.5 %   Platelets 331 150 - 400 K/uL  Basic metabolic panel     Status: Abnormal   Collection Time: 05/01/17 12:04 PM  Result Value Ref Range   Sodium 135 135 - 145 mmol/L   Potassium 4.6 3.5 - 5.1 mmol/L   Chloride 101 101 - 111 mmol/L   CO2 24 22 - 32 mmol/L   Glucose, Bld 96 65 - 99 mg/dL   BUN 24 (H) 6 - 20 mg/dL   Creatinine, Ser 1.03 (H) 0.44 - 1.00 mg/dL   Calcium 9.2 8.9 -  10.3 mg/dL   GFR calc non Af Amer 53 (L) >60 mL/min   GFR calc Af Amer >60 >60 mL/min    Comment: (NOTE) The eGFR has been calculated using the CKD EPI equation. This calculation has not been validated in all clinical situations. eGFR's persistently <60 mL/min signify possible Chronic Kidney Disease.    Anion gap 10 5 - 15   No results found.  Review of Systems  Musculoskeletal: Positive for back pain and myalgias.  Neurological: Positive for tingling and sensory change.    Blood pressure (!) 149/92, pulse 99, temperature 97.6 F (36.4 C), temperature source Oral, resp. rate 20, height 5' (1.524 m), weight 63 kg (139 lb), last menstrual period 08/05/1991, SpO2 98 %. Physical Exam  Constitutional: She is oriented to person, place, and time. She appears well-developed.  HENT:  Head: Normocephalic.  Eyes: Pupils are equal, round, and reactive to light.  Neck: Normal range of motion.  GI: Soft.  Neurological: She is alert and oriented to person, place, and time. She has normal strength. GCS eye subscore is 4. GCS verbal subscore is 5. GCS motor subscore is 6.  Strength is 5 out of 5 iliopsoas, quads, hamstrings, gastric, into tibialis, and EHL.     Assessment/Plan 71 year old presents for an L4-5 left-sided Limited microdiscectomy  Cheryl Lee P, MD 05/01/2017, 3:40 PM

## 2017-05-01 NOTE — Anesthesia Procedure Notes (Signed)
Procedure Name: Intubation Date/Time: 05/01/2017 4:13 PM Performed by: Julieta Bellini, CRNA Pre-anesthesia Checklist: Patient identified, Emergency Drugs available, Suction available and Patient being monitored Patient Re-evaluated:Patient Re-evaluated prior to induction Oxygen Delivery Method: Circle system utilized Preoxygenation: Pre-oxygenation with 100% oxygen Induction Type: IV induction Ventilation: Mask ventilation without difficulty Laryngoscope Size: Mac and 3 Grade View: Grade I Tube type: Oral Tube size: 7.0 mm Number of attempts: 1 Airway Equipment and Method: Stylet Placement Confirmation: ETT inserted through vocal cords under direct vision,  positive ETCO2 and breath sounds checked- equal and bilateral Secured at: 21 cm Tube secured with: Tape Dental Injury: Teeth and Oropharynx as per pre-operative assessment

## 2017-05-01 NOTE — Anesthesia Postprocedure Evaluation (Signed)
Anesthesia Post Note  Patient: Cheryl Lee  Procedure(s) Performed: Microdiscectomy - Lumbar four-five  - left (Left Back)     Patient location during evaluation: PACU Anesthesia Type: General Level of consciousness: awake, awake and alert and oriented Pain management: pain level controlled Vital Signs Assessment: post-procedure vital signs reviewed and stable Respiratory status: spontaneous breathing, respiratory function stable and nonlabored ventilation Cardiovascular status: blood pressure returned to baseline Anesthetic complications: no    Last Vitals:  Vitals:   05/01/17 1906 05/01/17 1920  BP:  (!) 150/86  Pulse: 100 (!) 105  Resp: 16 18  Temp: 37.1 C 36.8 C  SpO2: 95% 98%    Last Pain:  Vitals:   05/01/17 2030  TempSrc:   PainSc: 7                  Lenay Lovejoy COKER

## 2017-05-01 NOTE — Progress Notes (Signed)

## 2017-05-01 NOTE — Anesthesia Postprocedure Evaluation (Signed)
Anesthesia Post Note  Patient: Cheryl Lee  Procedure(s) Performed: Microdiscectomy - Lumbar four-five  - left (Left Back)     Patient location during evaluation: PACU Anesthesia Type: General Level of consciousness: awake Pain management: pain level controlled Respiratory status: spontaneous breathing Cardiovascular status: stable Anesthetic complications: no    Last Vitals:  Vitals:   05/01/17 1900 05/01/17 1906  BP:    Pulse: 96 100  Resp: 17 16  Temp:  37.1 C  SpO2: 98% 95%    Last Pain:  Vitals:   05/01/17 1823  TempSrc:   PainSc: 6     LLE Motor Response: Purposeful movement;Responds to commands (05/01/17 1900) LLE Sensation: Numbness;Tingling(same pre-op) (05/01/17 1900) RLE Motor Response: Purposeful movement;Responds to commands (05/01/17 1900) RLE Sensation: No numbness;No tingling (05/01/17 1900)      Bruk Tumolo

## 2017-05-01 NOTE — Op Note (Signed)
Preoperative diagnosis: Lumbar spinal stenosis and left L5 radicular the with herniated this pulposus L4-5 left  Postoperative diagnosis: Same  Procedure: Lumbar N laminectomy microscope discectomy L4-5 on the left with microdissection of left L5 nerve root microscopic discectomy  Surgeon: Dominica Severin Kessler Solly  Asst.: Glenford Peers  Anesthesia: Gen.  EBL: Minimal  History of present illness: 71 year old female with long-standing back painsustained an inferior endplate of L4 compression fracture at the same time the herniated disc on the left at L4-5. Due to patient's intractable left L5 pedicle up pain and progressive clinical syndrome I recommended laminectomy discectomy at L4-5 on the left. I extensively went over the risks and benefits of the operation with the patient as well as perioperative course expectations of outcome alternatives surgery she understands and agrees to proceed forward.  Operative procedure: Patient brought into the or was induced on general anesthesia positioned prone the Wilson frame her back was prepped and draped in routine sterile fashion preoperative x-ray localized the appropriate level so after infiltration of 8 mL lidocaine with epi a midline incision was made and Bovie light car was used to take down the subcutaneous tissues and subperiosteal dissections care lamina of L4 and L5 on the left. Interoperative x-ray confirmed the location. Level. So the inferior aspect of the lamina of L4 medial facet complex super aspect the limbus of L5 was drilled out with a high-speed drill laminotomy was begun with a 2 mm Kerrison punch ligament was noted be markedly hypertrophied and this was aggressively under bitten gaining access lateral margins of the canal and identified the left L5 pedicle. Then working cephalad identify the disc space there was a large subligamentous disc herniation causing distortion of the ventral side of the L5 nerve root and thecal sac. Annulotomy was made below  the scalpel disc space cleaned out. Several large free fragments removed from the central compartment as well as some ligamentous inferior to the disc space. At the end the discectomy there is no further stenosis. Sac was widely decompressed L5 foramen was widely decompressed wounds and to proceed irrigated meticulous hemostasis was maintained Gelfoam was opened up the dura the muscle fascia proximate layers with after Vicryl skin was closed running 4 subcuticular Dermabond benzo and Steri-Strips and sterile dressings applied patient recovered in stable condition. At the end the case all needle counts sponge counts were correct.

## 2017-05-02 ENCOUNTER — Encounter (HOSPITAL_COMMUNITY): Payer: Self-pay | Admitting: Neurosurgery

## 2017-05-02 DIAGNOSIS — M5116 Intervertebral disc disorders with radiculopathy, lumbar region: Secondary | ICD-10-CM | POA: Diagnosis not present

## 2017-05-02 DIAGNOSIS — E7800 Pure hypercholesterolemia, unspecified: Secondary | ICD-10-CM | POA: Diagnosis not present

## 2017-05-02 DIAGNOSIS — K219 Gastro-esophageal reflux disease without esophagitis: Secondary | ICD-10-CM | POA: Diagnosis not present

## 2017-05-02 DIAGNOSIS — M48061 Spinal stenosis, lumbar region without neurogenic claudication: Secondary | ICD-10-CM | POA: Diagnosis not present

## 2017-05-02 DIAGNOSIS — I10 Essential (primary) hypertension: Secondary | ICD-10-CM | POA: Diagnosis not present

## 2017-05-02 DIAGNOSIS — Z7982 Long term (current) use of aspirin: Secondary | ICD-10-CM | POA: Diagnosis not present

## 2017-05-02 MED ORDER — OXYCODONE HCL 5 MG PO TABS: 5.0000 mg | ORAL_TABLET | ORAL | 0 refills | Status: DC | PRN

## 2017-05-02 MED ORDER — CYCLOBENZAPRINE HCL 10 MG PO TABS: 10.0000 mg | ORAL_TABLET | Freq: Three times a day (TID) | ORAL | 0 refills | Status: DC | PRN

## 2017-05-02 NOTE — Progress Notes (Signed)
Physical Therapy Treatment Patient Details Name: Cheryl Lee MRN: [id number] DOB: [birth year] Today's Date: 05/02/2017    History of Present Illness Pt is a 71 y/o female who presents s/p L4-L5 laminectomy/decompression on 05/01/17.    PT Comments    Focus of session was pt education on precautions, car transfer and stair training. Unfortunately, pt declined stair training, so verbally reviewed sequencing and safety factors. Therapist assisted pt with car transfer, and pt required min assist for safe entry to car. VC's for sequencing and maintenance of precautions required. Daughter present for education as well. Will continue to follow and progress as able per POC.   Follow Up Recommendations  Home health PT;Supervision/Assistance - 24 hour     Equipment Recommendations  3in1 (PT);Rolling walker with 5" wheels(youth size)    Recommendations for Other Services OT consult     Precautions / Restrictions Precautions Precautions: Fall;Back Precaution Booklet Issued: No Precaution Comments: Reviewed precautions with pt and daughter Required Braces or Orthoses: Spinal Brace Spinal Brace: Lumbar corset Restrictions Weight Bearing Restrictions: No    Mobility  Bed Mobility Overal bed mobility: Needs Assistance Bed Mobility: Rolling;Sidelying to Sit;Sit to Sidelying Rolling: Supervision Sidelying to sit: Supervision     Sit to sidelying: Supervision General bed mobility comments: Pt received in chair  Transfers Overall transfer level: Needs assistance Equipment used: Rolling walker (2 wheeled) Transfers: Sit to/from Stand Sit to Stand: Min guard         General transfer comment: Cues for hand placement. Min guard for safety  Ambulation/Gait                 Stairs            Wheelchair Mobility    Modified Rankin (Stroke Patients Only)       Balance Overall balance assessment: Needs assistance Sitting-balance support: Feet supported;No upper  extremity supported Sitting balance-Leahy Scale: Good     Standing balance support: No upper extremity supported;During functional activity Standing balance-Leahy Scale: Fair Standing balance comment: Reliant on RW for support                            Cognition Arousal/Alertness: Awake/alert Behavior During Therapy: WFL for tasks assessed/performed Overall Cognitive Status: Within Functional Limits for tasks assessed                                        Exercises      General Comments        Pertinent Vitals/Pain Pain Assessment: Faces Faces Pain Scale: Hurts little more Pain Location: LLE Pain Descriptors / Indicators: Grimacing;Sore Pain Intervention(s): Limited activity within patient's tolerance;Monitored during session;Repositioned    Home Living Family/patient expects to be discharged to:: Private residence Living Arrangements: Children Available Help at Discharge: Family;Available PRN/intermittently Type of Home: House Home Access: Stairs to enter Entrance Stairs-Rails: Left Home Layout: One level Home Equipment: Walker - standard;Shower seat;Hand held shower head      Prior Function Level of Independence: Needs assistance  Gait / Transfers Assistance Needed: Supervision ADL's / Homemaking Assistance Needed: Pt typically sits on a folding chair in the shower and daughter assists with shower and dressing.      PT Goals (current goals can now be found in the care plan section) Acute Rehab PT Goals Patient Stated Goal: Home this afternoon PT Goal Formulation: With  patient/family Time For Goal Achievement: 05/16/17 Potential to Achieve Goals: Good Progress towards PT goals: Progressing toward goals    Frequency    Min 5X/week      PT Plan Current plan remains appropriate    Co-evaluation              AM-PAC PT "6 Clicks" Daily Activity  Outcome Measure  Difficulty turning over in bed (including adjusting  bedclothes, sheets and blankets)?: A Little Difficulty moving from lying on back to sitting on the side of the bed? : A Little Difficulty sitting down on and standing up from a chair with arms (e.g., wheelchair, bedside commode, etc,.)?: A Little Help needed moving to and from a bed to chair (including a wheelchair)?: A Little Help needed walking in hospital room?: A Little Help needed climbing 3-5 steps with a railing? : A Lot 6 Click Score: 17    End of Session Equipment Utilized During Treatment: Back brace Activity Tolerance: Patient tolerated treatment well Patient left: in chair;with call bell/phone within reach Nurse Communication: Mobility status PT Visit Diagnosis: Unsteadiness on feet (R26.81);Pain;Other symptoms and signs involving the nervous system (R29.898) Pain - Right/Left: Left Pain - part of body: Leg     Time: 1355-1415 PT Time Calculation (min) (ACUTE ONLY): 20 min  Charges:  $Gait Training: 8-22 mins                    G Codes:       Rolinda Roan, PT, DPT Acute Rehabilitation Services Pager: [place] 05/02/2017, 3:09 PM

## 2017-05-02 NOTE — Discharge Summary (Signed)
Physician Discharge Summary  Patient ID: Cheryl Lee MRN: [id number] DOB/AGE: [birth year] 71 y.o.  Admit date: 05/01/2017 Discharge date: 05/02/2017  Admission Diagnoses: Lumbar spinal stenosis and left L5 radicular the with herniated this pulposus L4-5 left    Discharge Diagnoses: same as admitting   Discharged Condition: good  Hospital Course: The patient was admitted on 05/01/2017 and taken to the operating room where the patient underwent L4-5 microdisckectomy. The patient tolerated the procedure well and was taken to the recovery room and then to the floor in stable condition. The hospital course was routine. There were no complications. The wound remained clean dry and intact. Pt had appropriate back soreness. No complaints of new pain or new N/T/W. The patient remained afebrile with stable vital signs, and tolerated a regular diet. The patient continued to increase activities, and pain was well controlled with oral pain medications.   Consults: None  Significant Diagnostic Studies:  Results for orders placed or performed during the hospital encounter of 05/01/17  CBC  Result Value Ref Range   WBC 9.6 4.0 - 10.5 K/uL   RBC 4.32 3.87 - 5.11 MIL/uL   Hemoglobin 13.8 12.0 - 15.0 g/dL   HCT 43.1 36.0 - 46.0 %   MCV 99.8 78.0 - 100.0 fL   MCH 31.9 26.0 - 34.0 pg   MCHC 32.0 30.0 - 36.0 g/dL   RDW 13.7 11.5 - 15.5 %   Platelets 331 150 - 400 K/uL  Basic metabolic panel  Result Value Ref Range   Sodium 135 135 - 145 mmol/L   Potassium 4.6 3.5 - 5.1 mmol/L   Chloride 101 101 - 111 mmol/L   CO2 24 22 - 32 mmol/L   Glucose, Bld 96 65 - 99 mg/dL   BUN 24 (H) 6 - 20 mg/dL   Creatinine, Ser 1.03 (H) 0.44 - 1.00 mg/dL   Calcium 9.2 8.9 - 10.3 mg/dL   GFR calc non Af Amer 53 (L) >60 mL/min   GFR calc Af Amer >60 >60 mL/min   Anion gap 10 5 - 15    Dg Lumbar Spine 2-3 Views  Result Date: 05/01/2017 CLINICAL DATA:  Micro discectomy EXAM: LUMBAR SPINE - 2-3 VIEW COMPARISON:   MRI 02/14/2017 FINDINGS: Three lateral images of the lumbar spine. Compression deformity of L4 as before. Surgical localizing device posterior to L4. Subsequent hardware posterior to L4-L5. Degenerative changes at L1-L2, L2-L3 and L3-L4. IMPRESSION: Lateral views of the lumbar spine obtained during lumbar spine surgery. Compression deformity at L4 Electronically Signed   By: Donavan Foil M.D.   On: 05/01/2017 19:28    Antibiotics:  Anti-infectives (From admission, onward)   Start     Dose/Rate Route Frequency Ordered Stop   05/01/17 2330  ceFAZolin (ANCEF) IVPB 2g/100 mL premix     2 g 200 mL/hr over 30 Minutes Intravenous Every 8 hours 05/01/17 1919 05/02/17 0650   05/01/17 1645  bacitracin 50,000 Units in sodium chloride irrigation 0.9 % 500 mL irrigation  Status:  Discontinued       As needed 05/01/17 1645 05/01/17 1735   05/01/17 1400  ceFAZolin (ANCEF) IVPB 2g/100 mL premix     2 g 200 mL/hr over 30 Minutes Intravenous On call to O.R. 05/01/17 1138 05/01/17 1625   05/01/17 1141  ceFAZolin (ANCEF) 2-4 GM/100ML-% IVPB    Comments:  Rosenberger, Meredit: cabinet override      05/01/17 1141 05/01/17 1615      Discharge Exam: Blood pressure 130/80, pulse  99, temperature 98.5 F (36.9 C), resp. rate 16, height 5' (1.524 m), weight 63 kg (139 lb), last menstrual period 08/05/1991, SpO2 97 %. Neurologic: Grossly normal Ambulating and voiding well  Discharge Medications:   Allergies as of 05/02/2017      Reactions   Lycopene Itching, Rash      Medication List    TAKE these medications   alendronate 70 MG tablet Commonly known as:  FOSAMAX Take 1 tablet (70 mg total) by mouth every 7 (seven) days. Take with a full glass of water on an empty stomach. What changed:    when to take this  additional instructions   aspirin 81 MG tablet Take 81 mg by mouth daily.   atorvastatin 10 MG tablet Commonly known as:  LIPITOR Take 1 tablet (10 mg total) by mouth daily.    cyclobenzaprine 10 MG tablet Commonly known as:  FLEXERIL Take 1 tablet (10 mg total) by mouth 3 (three) times daily as needed for muscle spasms.   diclofenac sodium 1 % Gel Commonly known as:  VOLTAREN Apply 2 g topically 4 (four) times daily.   fluticasone 50 MCG/ACT nasal spray Commonly known as:  FLONASE Place 2 sprays into both nostrils daily.   ibuprofen 400 MG tablet Commonly known as:  ADVIL,MOTRIN Take 1 tablet (400 mg total) by mouth every 8 (eight) hours as needed.   lisinopril-hydrochlorothiazide 20-12.5 MG tablet Commonly known as:  PRINZIDE,ZESTORETIC Take 0.5 tablets by mouth daily.   multivitamins ther. w/minerals Tabs tablet Take 0.5 tablets by mouth 2 (two) times daily.   omeprazole 20 MG capsule Commonly known as:  PRILOSEC Take 1 capsule (20 mg total) by mouth daily.   ondansetron 4 MG tablet Commonly known as:  ZOFRAN Take 1 tablet (4 mg total) by mouth every 6 (six) hours.   oxyCODONE 5 MG immediate release tablet Commonly known as:  ROXICODONE Take 1 tablet (5 mg total) by mouth every 4 (four) hours as needed for severe pain.   vitamin C 500 MG tablet Commonly known as:  ASCORBIC ACID Take 500 mg by mouth daily.       Disposition: home   Final Dx: same as admitting  Discharge Instructions     Remove dressing in 72 hours   Complete by:  As directed    Call MD for:  difficulty breathing, headache or visual disturbances   Complete by:  As directed    Call MD for:  extreme fatigue   Complete by:  As directed    Call MD for:  hives   Complete by:  As directed    Call MD for:  persistant dizziness or light-headedness   Complete by:  As directed    Call MD for:  persistant nausea and vomiting   Complete by:  As directed    Call MD for:  redness, tenderness, or signs of infection (pain, swelling, redness, odor or green/yellow discharge around incision site)   Complete by:  As directed    Call MD for:  severe uncontrolled pain   Complete  by:  As directed    Call MD for:  temperature >100.4   Complete by:  As directed    Diet - low sodium heart healthy   Complete by:  As directed    Driving Restrictions   Complete by:  As directed    Not until f/u appt   Increase activity slowly   Complete by:  As directed    Lifting restrictions  Complete by:  As directed    Nothing more than 8 lbs         Signed: Ocie Cornfield Maggi Hershkowitz 05/02/2017, 10:09 AM

## 2017-05-02 NOTE — Discharge Instructions (Signed)

## 2017-05-02 NOTE — Evaluation (Signed)
Occupational Therapy Evaluation Patient Details Name: Cheryl Lee MRN: [id number] DOB: [birth year] Today's Date: 05/02/2017    History of Present Illness Pt is a 71 y/o female who presents s/p L4-L5 laminectomy/decompression on 05/01/17.   Clinical Impression   Pt reports she required min assist from her daughter with ADL PTA. Currently pt overall min guard assist for ADL and functional mobility. Pt requires mod cues to maintain back precautions during functional activities. Educated pt and daughter on back precautions, home safety, and ADL management. Pt planning to d/c home with intermittent supervision from family. Pt would benefit from continued skilled OT to address established goals.    Follow Up Recommendations  No OT follow up;Supervision/Assistance - 24 hour    Equipment Recommendations  3 in 1 bedside commode    Recommendations for Other Services       Precautions / Restrictions Precautions Precautions: Fall;Back Precaution Booklet Issued: No Precaution Comments: Reviewed precautions with pt and daughter Required Braces or Orthoses: Spinal Brace Spinal Brace: Lumbar corset Restrictions Weight Bearing Restrictions: No      Mobility Bed Mobility Overal bed mobility: Needs Assistance Bed Mobility: Rolling;Sidelying to Sit;Sit to Sidelying Rolling: Supervision Sidelying to sit: Supervision     Sit to sidelying: Supervision General bed mobility comments: Supervision for safety. Step by step verbal cues provided for log roll technique. HOB flat without use of bed rail  Transfers Overall transfer level: Needs assistance Equipment used: Rolling walker (2 wheeled) Transfers: Sit to/from Stand Sit to Stand: Min guard         General transfer comment: Cues for hand placement. Min guard for safety    Balance Overall balance assessment: Needs assistance Sitting-balance support: Feet supported;No upper extremity supported Sitting balance-Leahy Scale: Good     Standing balance support: No upper extremity supported;During functional activity Standing balance-Leahy Scale: Fair                             ADL either performed or assessed with clinical judgement   ADL Overall ADL's : Needs assistance/impaired Eating/Feeding: Set up;Sitting   Grooming: Supervision/safety;Standing Grooming Details (indicate cue type and reason): Educated on use of 2 cups for oral care Upper Body Bathing: Set up;Supervision/ safety;Sitting   Lower Body Bathing: Min guard;Sit to/from stand   Upper Body Dressing : Set up;Supervision/safety;Sitting Upper Body Dressing Details (indicate cue type and reason): for shirt and brace management Lower Body Dressing: Min guard;Sit to/from stand Lower Body Dressing Details (indicate cue type and reason): pt able to don underwear, pants, socks and shoes without bending. Pt able to cross foot over opposite knee. Educated on compensatory strategies for LB ADL. Toilet Transfer: Min guard;Ambulation;BSC;RW     Toileting - Clothing Manipulation Details (indicate cue type and reason): Educated pt on proper technique for peri care without twisting and use of wet wipes Tub/ Shower Transfer: Min guard;Walk-in shower;Ambulation;Shower Technical sales engineer Details (indicate cue type and reason): educated on use of shower chair and supervision for safety upon return home Functional mobility during ADLs: Min guard;Rolling walker General ADL Comments: Educated pt on maintaining back precautions during functional activities, keeping frequently used items at counter top height, brace management and wear schedule, frequent mobility thorughout the day upon return home, log roll for bed mobility     Vision         Perception     Praxis      Pertinent Vitals/Pain Pain Assessment: Faces Faces Pain  Scale: Hurts little more Pain Location: LLE Pain Descriptors / Indicators: Grimacing;Sore Pain  Intervention(s): Monitored during session     Hand Dominance     Extremity/Trunk Assessment Upper Extremity Assessment Upper Extremity Assessment: Overall WFL for tasks assessed   Lower Extremity Assessment Lower Extremity Assessment: Defer to PT evaluation   Cervical / Trunk Assessment Cervical / Trunk Assessment: Kyphotic;Other exceptions Cervical / Trunk Exceptions: s/p spine sx   Communication Communication Communication: No difficulties   Cognition Arousal/Alertness: Awake/alert Behavior During Therapy: WFL for tasks assessed/performed Overall Cognitive Status: Within Functional Limits for tasks assessed                                     General Comments       Exercises     Shoulder Instructions      Home Living Family/patient expects to be discharged to:: Private residence Living Arrangements: Children Available Help at Discharge: Family;Available PRN/intermittently Type of Home: House Home Access: Stairs to enter [employer] of Steps: 4 Entrance Stairs-Rails: Left Home Layout: One level     Bathroom Shower/Tub: Occupational psychologist: Standard     Home Equipment: Walker - standard;Shower seat;Hand held shower head          Prior Functioning/Environment Level of Independence: Needs assistance  Gait / Transfers Assistance Needed: Supervision ADL's / Homemaking Assistance Needed: Pt typically sits on a folding chair in the shower and daughter assists with shower and dressing.             OT Problem List: Impaired balance (sitting and/or standing);Decreased knowledge of use of DME or AE;Decreased knowledge of precautions;Pain      OT Treatment/Interventions: Self-care/ADL training;Therapeutic exercise;Energy conservation;DME and/or AE instruction;Therapeutic activities;Patient/family education;Balance training    OT Goals(Current goals can be found in the care plan section) Acute Rehab OT Goals Patient  Stated Goal: Home at d/c OT Goal Formulation: With patient/family Time For Goal Achievement: 05/16/17 Potential to Achieve Goals: Good ADL Goals Additional ADL Goal #1: Pt will independently verbally recall 3/3 back precautions and maintain throughout ADL. Additional ADL Goal #2: Pt will don/doff back brace with set up as precursor to ADL. Additional ADL Goal #3: Pt will perform log roll for bed mobility with mod I.  OT Frequency: Min 2X/week   Barriers to D/C:            Co-evaluation              AM-PAC PT "6 Clicks" Daily Activity     Outcome Measure Help from another person eating meals?: None Help from another person taking care of personal grooming?: A Little Help from another person toileting, which includes using toliet, bedpan, or urinal?: A Little Help from another person bathing (including washing, rinsing, drying)?: A Little Help from another person to put on and taking off regular upper body clothing?: A Little Help from another person to put on and taking off regular lower body clothing?: A Little 6 Click Score: 19   End of Session Equipment Utilized During Treatment: Rolling walker;Back brace Nurse Communication: Mobility status;Other (comment)(equipment and f/u needs)  Activity Tolerance: Patient tolerated treatment well Patient left: in bed;with call bell/phone within reach;with family/visitor present  OT Visit Diagnosis: Other abnormalities of gait and mobility (R26.89);Pain Pain - part of body: (back)                Time: 5852-7782  OT Time Calculation (min): 27 min Charges:  OT General Charges $OT Visit: 1 Visit OT Evaluation $OT Eval Moderate Complexity: 1 Mod OT Treatments $Self Care/Home Management : 8-22 mins G-Codes:     Viaan Knippenberg A. Ulice Brilliant, M.S., OTR/L Pager: [place] 05/02/2017, 1:49 PM

## 2017-05-02 NOTE — Care Management Note (Signed)
Case Management Note  Patient Details  Name: Cheryl Lee MRN: [id number] Date of Birth: 1946-03-01  Subjective/Objective: 71 yr old female s/p L4-5 Microdisckectomy.                   Action/Plan: Case manager spoke with patient's daughter concerning discharge plan. Choice for Home Health Agency was offered. Daughter politely declines, stating she and her mom dont feel she needs Home Health. CM informed bedside nurse. Patient will have family support at [facility].   Expected Discharge Date:  05/02/17               Expected Discharge Plan:  Home/Self Care  In-House Referral:     Discharge planning Services  CM Consult  Post Acute Care Choice:  Home Health Choice offered to:  Spouse  DME Arranged:    DME Agency:     HH Arranged:  Patient Refused [place] Agency:  NA  Status of Service:  Completed, signed off  If discussed at [place] of Stay Meetings, dates discussed:    Additional Comments:  Cheryl Meeker, RN 05/02/2017, 10:36 AM

## 2017-05-02 NOTE — Progress Notes (Signed)
Patient alert and oriented, mae's well, voiding adequate amount of urine, swallowing without difficulty,  c/o mild pain at time of discharge. Patient discharged home with family. Script and discharged instructions given to patient. Patient and family stated understanding of instructions given. Patient has an appointment with Dr. Cram   

## 2017-05-02 NOTE — Evaluation (Signed)
Physical Therapy Evaluation Patient Details Name: Cheryl Lee MRN: [id number] DOB: [birth year] Today's Date: 05/02/2017   History of Present Illness  Pt is a 71 y/o female who presents s/p L4-L5 laminectomy/decompression on 05/01/17.  Clinical Impression  Pt admitted with above diagnosis. Pt currently with functional limitations due to the deficits listed below (see PT Problem List). At the time of PT eval pt was able to perform transfers and ambulation with gross min guard to min assist for balance support and safety. Pt demonstrated difficulty maintaining precautions during toileting, and feel an OT eval would be beneficial. Tolerance for functional activity is low, and will need 24 hour support at home initially. Pt will benefit from skilled PT to increase their independence and safety with mobility to allow discharge to the venue listed below.       Follow Up Recommendations Home health PT;Supervision/Assistance - 24 hour    Equipment Recommendations  3in1 (PT);Rolling walker with 5" wheels(youth size)    Recommendations for Other Services OT consult     Precautions / Restrictions Precautions Precautions: Fall;Back Precaution Booklet Issued: Yes (comment) Precaution Comments: Reviewed handout with pt and daughter. Pt was cued for precautions during functional mobility.  Required Braces or Orthoses: Spinal Brace Spinal Brace: Lumbar corset(None ordered, however has a corset for compression fx) Restrictions Weight Bearing Restrictions: No      Mobility  Bed Mobility Overal bed mobility: Needs Assistance Bed Mobility: Rolling;Sidelying to Sit Rolling: Supervision Sidelying to sit: Min guard       General bed mobility comments: VC's for log roll technique. No assist required.   Transfers Overall transfer level: Needs assistance Equipment used: Rolling walker (2 wheeled) Transfers: Sit to/from Stand Sit to Stand: Min guard         General transfer comment: VC's  for hand placement on seated surface for safety. No assist required however hands on guarding provided for safety.   Ambulation/Gait Ambulation/Gait assistance: Min assist Ambulation Distance (Feet): 100 Feet Assistive device: Rolling walker (2 wheeled) Gait Pattern/deviations: Step-through pattern;Trunk flexed Gait velocity: Decreased Gait velocity interpretation: Below normal speed for age/gender General Gait Details: VC's for improved posture and general safety with RW use. Pt required wheelchair for transport back to the room as LLE pain became too much for pt to continue ambulating.   Stairs Stairs: Yes Stairs assistance: Min assist Stair Management: One rail Left;Step to pattern;Forwards Number of Stairs: 1 General stair comments: VC's for sequencing and general safety. Heavy min assist for step up. Not following directions for safety and rushed step up which likely increased pain.   Wheelchair Mobility    Modified Rankin (Stroke Patients Only)       Balance Overall balance assessment: Needs assistance Sitting-balance support: Feet supported;No upper extremity supported Sitting balance-Leahy Scale: Fair     Standing balance support: No upper extremity supported;During functional activity Standing balance-Leahy Scale: Poor Standing balance comment: Reliant on RW for support                             Pertinent Vitals/Pain Pain Assessment: Faces Faces Pain Scale: Hurts whole lot Pain Location: Incision site, L leg Pain Descriptors / Indicators: Operative site guarding;Aching Pain Intervention(s): Limited activity within patient's tolerance;Monitored during session;Repositioned    Home Living Family/patient expects to be discharged to:: Private residence Living Arrangements: Children Available Help at Discharge: Family;Available PRN/intermittently Type of Home: House Home Access: Stairs to enter Entrance Stairs-Rails: Left Entrance Stairs-Number  of  Steps: 4 Home Layout: One level Home Equipment: Walker - standard Additional Comments: Above information is for daughter's home where she will be d/cing to.     Prior Function Level of Independence: Needs assistance   Gait / Transfers Assistance Needed: Supervision  ADL's / Homemaking Assistance Needed: Pt typically sits on a folding chair in the shower and daughter assists with shower and dressing.         Hand Dominance        Extremity/Trunk Assessment   Upper Extremity Assessment Upper Extremity Assessment: Defer to OT evaluation    Lower Extremity Assessment Lower Extremity Assessment: LLE deficits/detail LLE Deficits / Details: Decreased strength consistent with pre-op diagnosis LLE: Unable to fully assess due to pain    Cervical / Trunk Assessment Cervical / Trunk Assessment: Kyphotic  Communication      Cognition Arousal/Alertness: Awake/alert Behavior During Therapy: WFL for tasks assessed/performed Overall Cognitive Status: Within Functional Limits for tasks assessed                                        General Comments      Exercises     Assessment/Plan    PT Assessment    PT Problem List         PT Treatment Interventions      PT Goals (Current goals can be found in the Care Plan section)  Acute Rehab PT Goals Patient Stated Goal: Home at d/c PT Goal Formulation: With patient/family Time For Goal Achievement: 05/16/17 Potential to Achieve Goals: Good    Frequency Min 5X/week   Barriers to discharge        Co-evaluation               AM-PAC PT "6 Clicks" Daily Activity  Outcome Measure Difficulty turning over in bed (including adjusting bedclothes, sheets and blankets)?: A Little Difficulty moving from lying on back to sitting on the side of the bed? : A Little Difficulty sitting down on and standing up from a chair with arms (e.g., wheelchair, bedside commode, etc,.)?: A Little Help needed moving to and  from a bed to chair (including a wheelchair)?: A Little Help needed walking in hospital room?: A Little Help needed climbing 3-5 steps with a railing? : A Lot 6 Click Score: 17    End of Session Equipment Utilized During Treatment: Gait belt Activity Tolerance: Patient tolerated treatment well Patient left: in chair;with call bell/phone within reach Nurse Communication: Mobility status PT Visit Diagnosis: Unsteadiness on feet (R26.81);Pain;Other symptoms and signs involving the nervous system (R29.898) Pain - Right/Left: Left Pain - part of body: Leg    Time: 0810-0848 PT Time Calculation (min) (ACUTE ONLY): 38 min   Charges:   PT Evaluation $PT Eval Moderate Complexity: 1 Mod PT Treatments $Gait Training: 23-37 mins   PT G Codes:   PT G-Codes **NOT FOR INPATIENT CLASS** Functional Assessment Tool Used: Clinical judgement Functional Limitation: Mobility: Walking and moving around Mobility: Walking and Moving Around Current Status (N0539): At least 40 percent but less than 60 percent impaired, limited or restricted Mobility: Walking and Moving Around Goal Status [phone]): At least 20 percent but less than 40 percent impaired, limited or restricted    Rolinda Roan, PT, DPT Acute Rehabilitation Services Pager: [place] 05/02/2017, 9:15 AM

## 2017-05-08 NOTE — Progress Notes (Signed)
Late entry for missed gcode    05/02/17 1329  OT Time Calculation  OT Start Time (ACUTE ONLY) 1157  OT Stop Time (ACUTE ONLY) 1224  OT Time Calculation (min) 27 min  OT G-codes **NOT FOR INPATIENT CLASS**  Functional Assessment Tool Used Clinical judgement  Functional Limitation Self care  Self Care Current Status (C3837) CJ  Self Care Goal Status (R9396) CI  OT General Charges  $OT Visit 1 Visit  OT Evaluation  $OT Eval Moderate Complexity 1 Mod  OT Treatments  $Self Care/Home Management  8-22 mins   Nariyah Osias A. Ulice Brilliant, M.S., OTR/L Pager: [phone]

## 2017-05-16 ENCOUNTER — Other Ambulatory Visit (HOSPITAL_COMMUNITY): Payer: Self-pay | Admitting: Neurosurgery

## 2017-05-16 ENCOUNTER — Ambulatory Visit (HOSPITAL_COMMUNITY)
Admission: RE | Admit: 2017-05-16 | Discharge: 2017-05-16 | Disposition: A | Discharge status: Home / Self Care | Payer: Medicare Other | Source: Ambulatory Visit | Attending: Neurosurgery | Admitting: Neurosurgery

## 2017-05-16 DIAGNOSIS — M4856XA Collapsed vertebra, not elsewhere classified, lumbar region, initial encounter for fracture: Secondary | ICD-10-CM | POA: Diagnosis not present

## 2017-05-16 DIAGNOSIS — M48061 Spinal stenosis, lumbar region without neurogenic claudication: Secondary | ICD-10-CM | POA: Diagnosis not present

## 2017-05-16 DIAGNOSIS — M5126 Other intervertebral disc displacement, lumbar region: Secondary | ICD-10-CM | POA: Insufficient documentation

## 2017-05-16 MED ORDER — GADOBENATE DIMEGLUMINE 529 MG/ML IV SOLN
10.0000 mL | Freq: Once | INTRAVENOUS | Status: AC | PRN
  Administered 2017-05-16: 10 mL via INTRAVENOUS

## 2017-05-23 ENCOUNTER — Other Ambulatory Visit: Payer: Self-pay

## 2017-05-23 DIAGNOSIS — E785 Hyperlipidemia, unspecified: Secondary | ICD-10-CM

## 2017-05-23 DIAGNOSIS — K219 Gastro-esophageal reflux disease without esophagitis: Secondary | ICD-10-CM

## 2017-05-23 DIAGNOSIS — I10 Essential (primary) hypertension: Secondary | ICD-10-CM

## 2017-05-23 DIAGNOSIS — M810 Age-related osteoporosis without current pathological fracture: Secondary | ICD-10-CM

## 2017-05-23 MED ORDER — FLUTICASONE PROPIONATE 50 MCG/ACT NA SUSP: 2.0000 | Freq: Every day | NASAL | 1 refills | Status: DC

## 2017-05-23 MED ORDER — ALENDRONATE SODIUM 70 MG PO TABS: 70.0000 mg | ORAL_TABLET | ORAL | 1 refills | Status: DC

## 2017-05-23 MED ORDER — OMEPRAZOLE 20 MG PO CPDR: 20.0000 mg | DELAYED_RELEASE_CAPSULE | Freq: Every day | ORAL | 1 refills | Status: DC

## 2017-05-23 MED ORDER — LISINOPRIL-HYDROCHLOROTHIAZIDE 20-12.5 MG PO TABS: 0.5000 | ORAL_TABLET | Freq: Every day | ORAL | 1 refills | Status: DC

## 2017-05-23 MED ORDER — ATORVASTATIN CALCIUM 10 MG PO TABS: 10.0000 mg | ORAL_TABLET | Freq: Every day | ORAL | 1 refills | Status: DC

## 2017-06-03 ENCOUNTER — Ambulatory Visit: Payer: Medicare Other | Admitting: Nurse Practitioner

## 2017-06-11 ENCOUNTER — Ambulatory Visit: Payer: Medicare Other | Attending: Neurosurgery | Admitting: Physical Therapy

## 2017-06-11 ENCOUNTER — Other Ambulatory Visit: Payer: Self-pay

## 2017-06-11 ENCOUNTER — Encounter: Payer: Self-pay | Admitting: Physical Therapy

## 2017-06-11 DIAGNOSIS — M545 Low back pain: Secondary | ICD-10-CM | POA: Insufficient documentation

## 2017-06-11 DIAGNOSIS — M6281 Muscle weakness (generalized): Secondary | ICD-10-CM

## 2017-06-11 DIAGNOSIS — R293 Abnormal posture: Secondary | ICD-10-CM | POA: Insufficient documentation

## 2017-06-11 NOTE — Therapy (Signed)
[facility] [place], [place], [address] Phone: [phone]   Fax:  [phone]  Physical Therapy Evaluation  Patient Details  Name: Cheryl Lee MRN: [id number] Date of Birth: [birth year] Referring Provider: Dr. Kary Kos   Encounter Date: 06/11/2017  PT End of Session - 06/11/17 0907    Visit Number  1    Number of Visits  16    Date for PT Re-Evaluation  08/06/17    PT Start Time  0907    PT Stop Time  1001    PT Time Calculation (min)  54 min    Activity Tolerance  Patient tolerated treatment well    Behavior During Therapy  [facility] for tasks assessed/performed       Past Medical History:  Diagnosis Date  . Acid reflux   . Arthritis   . HNP (herniated nucleus pulposus), lumbar   . Hypercholesteremia   . Hypertension   . Osteoporosis   . Sinus congestion     Past Surgical History:  Procedure Laterality Date  . CATARACT EXTRACTION    . COLONOSCOPY    . LUMBAR LAMINECTOMY/DECOMPRESSION MICRODISCECTOMY Left 05/01/2017   Procedure: Microdiscectomy - Lumbar four-five  - left;  Surgeon: Kary Kos, MD;  Location: [place];  Service: Neurosurgery;  Laterality: Left;  . Right snkle repair      There were no vitals filed for this visit.   Subjective Assessment - 06/11/17 0909    Subjective  Patient began having pain in her left leg in August 2018. 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 Found small compression fracture.    Pertinent History  osteoporosis, R ankle fx from fall 2014    How long can you stand comfortably?  5 min (leg gives out)    How long can you walk comfortably?  2-3 min    Diagnostic tests  MRI showed L4/5 herniated disc    Patient Stated Goals  get rid of pain    Currently in Pain?  Yes    Pain Score  2     Pain Location  Leg    Pain Orientation  Left    Pain Type  Surgical pain;Acute pain    Pain Radiating Towards  intermittent in thigh or lower leg    Pain Onset   More than a month ago    Pain Frequency  Intermittent    Aggravating Factors   walking    Pain Relieving Factors  rest    Effect of Pain on Daily Activities  limited         [facility] PT Assessment - 06/11/17 0001      Assessment   Medical Diagnosis  closed compression fx L4 vertrebra    Referring Provider  Dr. Kary Kos    Onset Date/Surgical Date  05/01/17    Next MD Visit  06/22/17      Precautions   Precautions  Back    Precaution Comments  no bending, lifting or twisting    Required Braces or Orthoses  Spinal Brace    Spinal Brace  Lumbar corset where except when sleeping      Balance Screen   Has the patient fallen in the past 6 months  No    Has the patient had a decrease in activity level because of a fear of falling?   No    Is the patient reluctant to leave their home because of a fear of falling?  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      [facility] Adult PT Treatment/Exercise - 06/11/17 0001      Modalities   Modalities  Electrical Stimulation;Moist Heat      Moist Heat Therapy   Number Minutes Moist Heat  15 Minutes    Moist Heat Location  Lumbar Spine      Electrical Stimulation   Electrical Stimulation Location  premod 80-150 Hz x 59min to lumbar    Electrical Stimulation Goals  Pain             PT Education - 06/11/17 1053    Education provided  Yes    Education Details  HEP; supine<>sit transfers    Person(s) Educated  Patient    Methods  Explanation;Demonstration;Tactile cues;Handout;Verbal cues    Comprehension  Verbalized understanding;Returned demonstration       PT Short Term Goals - 06/11/17 1054      PT SHORT TERM GOAL #1   Title  I with initial HEP    Time  4    Period  Weeks    Status  New    Target Date  07/09/17      PT SHORT TERM GOAL #2   Title  Patient able to ambulate community distances safely with quad cane.    Time  4    Period  Weeks    Status  New    Target Date  07/09/17        PT Long Term Goals - 06/11/17 1055      PT LONG TERM GOAL #1   Title  I with advanced HEP including osteoporosis decompression exercises.    Time  8    Period  Weeks    Status  New    Target Date  08/06/17      PT LONG TERM GOAL #2   Title  Patient able to be on her feet for ADLS for 30 min or more with pain 2/10 or less.    Time  8  Period  Weeks    Status  New    Target Date  08/06/17      PT LONG TERM GOAL #3   Title  Patient able to ambulate community distances safely without AD.    Time  8    Period  Weeks    Status  New    Target Date  08/06/17             Plan - 06/11/17 0954    Clinical Impression Statement  Patient presents for low complexity evaluation s/p closed L4 compression fracture and L4/5 lumbar discectomy. She amb with a RW, has  decreased strength and ROM and is limited with standing and walking. Patient will benefit from PT to address these issues.    History and Personal Factors relevant to plan of care:  osteoporosis, L4 compression fx    Clinical Presentation  Stable    Clinical Decision Making  Low    Rehab Potential  Excellent    PT Frequency  2x / week    PT Duration  8 weeks    PT Treatment/Interventions  ADLs/Self Care Home Management;Cryotherapy;Electrical Stimulation;Moist Heat;Ultrasound;Therapeutic exercise;Neuromuscular re-education;Patient/family education    PT Next Visit Plan  s/p lumbar discectomy protocol; pain free strengthening for transverse abdominus/back/decompression for OP ; postural strengthening; gait training (wean off walker) modalities for pain prn    PT Home Exercise Plan  TA contraction, bridging and LTR    Consulted and Agree with Plan of Care  Patient       Patient will benefit from skilled therapeutic intervention in order to improve the following deficits and impairments:  Decreased range of motion, Pain, Decreased activity tolerance, Impaired flexibility, Postural dysfunction, Decreased strength  Visit Diagnosis: Acute midline low back pain, with sciatica presence unspecified - Plan: PT plan of care cert/re-cert  Muscle weakness (generalized) - Plan: PT plan of care cert/re-cert  Abnormal posture - Plan: PT plan of care cert/re-cert     Problem List Patient Active Problem List   Diagnosis Date Noted  . HNP (herniated nucleus pulposus), lumbar 05/01/2017  . BMI 29.0-29.9,adult 12/21/2014  . GERD (gastroesophageal reflux disease)   . Hyperlipidemia with target LDL less than 100   . Hypertension   . Osteoporosis     Madelyn Flavors PT 06/11/2017, 11:03 AM  [facility] [address] [place], [place], [address] Phone: [phone]   Fax:  [phone]  Name: Cheryl Lee MRN: [id number] Date of Birth: 16-Dec-1945

## 2017-06-11 NOTE — Patient Instructions (Signed)
  Abdominal bracing   Tighten stomach muscles by contracting pelvic floor muscles.  Hold 5-10 seconds. Repeat _10___ times per set. Do _1-3___ sets per session. Do __2__ sessions per day.  Bridge   Lie back, legs bent. Lift hips up and return down slowly. Repeat 10-30 times. Do __2__ sessions per day.     Knee to Chest (Flexion)   Pull knee toward chest. Feel stretch in lower back or buttock area. You may need to put the other leg straight to feel a better stretch. Breathing deeply, Hold __30__ seconds. Repeat with other knee. Repeat _3___ times each leg. Do _2___ sessions per day.    Lower Trunk Rotation Stretch   Keeping back flat and feet together, rotate knees to left side. Hold __10__ seconds. Repeat __5__ times per set. Do ____ sets per session. Do _2_ sessions per day.   Madelyn Flavors, PT 06/11/17 9:39 AM; [facility] [place], [place], [address] Phone: [phone]   Fax:  [phone]

## 2017-06-13 ENCOUNTER — Ambulatory Visit: Payer: Medicare Other | Admitting: Physical Therapy

## 2017-06-13 ENCOUNTER — Encounter: Payer: Self-pay | Admitting: Physical Therapy

## 2017-06-13 DIAGNOSIS — M545 Low back pain: Secondary | ICD-10-CM | POA: Diagnosis not present

## 2017-06-13 DIAGNOSIS — R293 Abnormal posture: Secondary | ICD-10-CM | POA: Diagnosis not present

## 2017-06-13 DIAGNOSIS — M6281 Muscle weakness (generalized): Secondary | ICD-10-CM

## 2017-06-13 NOTE — Therapy (Signed)
[facility] [place], [place], [address] Phone: [phone]   Fax:  [phone]  Physical Therapy Treatment  Patient Details  Name: Cheryl Lee MRN: [id number] Date of Birth: 19-Feb-1946 Referring Provider: Dr. Kary Kos   Encounter Date: 06/13/2017  PT End of Session - 06/13/17 0849    Visit Number  2    Number of Visits  16    Date for PT Re-Evaluation  08/06/17    PT Start Time  0902    PT Stop Time  0951    PT Time Calculation (min)  49 min    Activity Tolerance  Patient tolerated treatment well    Behavior During Therapy  [facility] for tasks assessed/performed       Past Medical History:  Diagnosis Date  . Acid reflux   . Arthritis   . HNP (herniated nucleus pulposus), lumbar   . Hypercholesteremia   . Hypertension   . Osteoporosis   . Sinus congestion     Past Surgical History:  Procedure Laterality Date  . CATARACT EXTRACTION    . COLONOSCOPY    . LUMBAR LAMINECTOMY/DECOMPRESSION MICRODISCECTOMY Left 05/01/2017   Procedure: Microdiscectomy - Lumbar four-five  - left;  Surgeon: Kary Kos, MD;  Location: [place];  Service: Neurosurgery;  Laterality: Left;  . Right snkle repair      There were no vitals filed for this visit.  Subjective Assessment - 06/13/17 0849    Subjective  Reports that her leg pain moves around some but reports that her back doesn't bother her. Reports that her pain in her leg she has pain.    Pertinent History  osteoporosis, R ankle fx from fall 2014    How long can you stand comfortably?  5 min (leg gives out)    How long can you walk comfortably?  2-3 min    Diagnostic tests  MRI showed L4/5 herniated disc    Patient Stated Goals  get rid of pain    Currently in Pain?  Yes    Pain Score  2     Pain Location  Leg    Pain Orientation  Left    Pain Descriptors / Indicators  Discomfort    Pain Type  Acute pain    Pain Onset  More than a month ago    Pain Frequency  Intermittent     Aggravating Factors   Walking         [facility] PT Assessment - 06/13/17 0001      Assessment   Medical Diagnosis  closed compression fx L4 vertrebra    Onset Date/Surgical Date  05/01/17    Next MD Visit  07/02/2017      Precautions   Precautions  Back    Precaution Comments  no bending, lifting or twisting    Required Braces or Orthoses  Spinal Brace    Spinal Brace  Lumbar corset                  [facility] Adult PT Treatment/Exercise - 06/13/17 0001      Exercises   Exercises  Lumbar      Lumbar Exercises: Stretches   Passive Hamstring Stretch  Right;Left;3 reps;30 seconds    Figure 4 Stretch  2 reps;30 seconds;Supine;With overpressure RLE    Gastroc Stretch  Left;3 reps;30 seconds      Lumbar Exercises: Aerobic   Nustep  L4 x12 min      Lumbar Exercises: Supine  Ab Set  10 reps;5 seconds    Bent Knee Raise  15 reps;2 seconds      Modalities   Modalities  Electrical Stimulation;Moist Heat      Moist Heat Therapy   Number Minutes Moist Heat  15 Minutes    Moist Heat Location  Lumbar Spine      Electrical Stimulation   Electrical Stimulation Location  B low back    Electrical Stimulation Action  Pre-mod    Electrical Stimulation Parameters  80-150 hz x15 min    Electrical Stimulation Goals  Pain               PT Short Term Goals - 06/11/17 1054      PT SHORT TERM GOAL #1   Title  I with initial HEP    Time  4    Period  Weeks    Status  New    Target Date  07/09/17      PT SHORT TERM GOAL #2   Title  Patient able to ambulate community distances safely with quad cane.    Time  4    Period  Weeks    Status  New    Target Date  07/09/17        PT Long Term Goals - 06/11/17 1055      PT LONG TERM GOAL #1   Title  I with advanced HEP including osteoporosis decompression exercises.    Time  8    Period  Weeks    Status  New    Target Date  08/06/17      PT LONG TERM GOAL #2   Title  Patient able to be on her feet for ADLS for 30  min or more with pain 2/10 or less.    Time  8    Period  Weeks    Status  New    Target Date  08/06/17      PT LONG TERM GOAL #3   Title  Patient able to ambulate community distances safely without AD.    Time  8    Period  Weeks    Status  New    Target Date  08/06/17            Plan - 06/13/17 4742    Clinical Impression Statement  Patient presented in treatment with only low level reports of LLE patient that extended to lateral calf during standing but with nustep and hip/knee flexion pain was reported in L thigh. Patient able to complete exercises as directed with reinforcement of VCs for proper core activation with cues of PPT. Patient initially reported discomfort in more of the calf region with HS thus gastroc stretch completed. Improved L HS stretch accomplished following gastroc stretch of LLE. VC and tactile cues provided resembling more PPT than ab set. Normal modalities response noted following removal of the modalities. Patient encouraged to continue HEP as directed as well as walking throughout the day.    Rehab Potential  Excellent    PT Frequency  2x / week    PT Duration  8 weeks    PT Treatment/Interventions  ADLs/Self Care Home Management;Cryotherapy;Electrical Stimulation;Moist Heat;Ultrasound;Therapeutic exercise;Neuromuscular re-education;Patient/family education    PT Next Visit Plan  s/p lumbar discectomy protocol; pain free strengthening for transverse abdominus/back/decompression for OP ; postural strengthening; gait training (wean off walker) modalities for pain prn    PT Home Exercise Plan  TA contraction, bridging and LTR    Consulted and  Agree with Plan of Care  Patient       Patient will benefit from skilled therapeutic intervention in order to improve the following deficits and impairments:  Decreased range of motion, Pain, Decreased activity tolerance, Impaired flexibility, Postural dysfunction, Decreased strength  Visit Diagnosis: Acute midline  low back pain, with sciatica presence unspecified  Muscle weakness (generalized)  Abnormal posture     Problem List Patient Active Problem List   Diagnosis Date Noted  . HNP (herniated nucleus pulposus), lumbar 05/01/2017  . BMI 29.0-29.9,adult 12/21/2014  . GERD (gastroesophageal reflux disease)   . Hyperlipidemia with target LDL less than 100   . Hypertension   . Osteoporosis     Standley Brooking, PTA 06/13/2017, 9:55 AM  [facility] [address] [place], [place], [address] Phone: [phone]   Fax:  [phone]  Name: Cheryl Lee MRN: [id number] Date of Birth: 03-06-1946

## 2017-06-18 ENCOUNTER — Ambulatory Visit: Payer: Medicare Other | Attending: Neurosurgery | Admitting: Physical Therapy

## 2017-06-18 ENCOUNTER — Encounter: Payer: Self-pay | Admitting: Physical Therapy

## 2017-06-18 DIAGNOSIS — M545 Low back pain: Secondary | ICD-10-CM

## 2017-06-18 DIAGNOSIS — R293 Abnormal posture: Secondary | ICD-10-CM | POA: Diagnosis not present

## 2017-06-18 DIAGNOSIS — M6281 Muscle weakness (generalized): Secondary | ICD-10-CM | POA: Diagnosis not present

## 2017-06-18 NOTE — Therapy (Signed)
[facility] [place], [place], [address] Phone: [phone]   Fax:  [phone]  Physical Therapy Treatment  Patient Details  Name: Cheryl Lee MRN: [id number] Date of Birth: [birth year] Referring Provider: Dr. Kary Kos   Encounter Date: 06/18/2017  PT End of Session - 06/18/17 1050    Visit Number  3    Number of Visits  16    Date for PT Re-Evaluation  08/06/17    PT Start Time  0900    PT Stop Time  0956    PT Time Calculation (min)  56 min       Past Medical History:  Diagnosis Date  . Acid reflux   . Arthritis   . HNP (herniated nucleus pulposus), lumbar   . Hypercholesteremia   . Hypertension   . Osteoporosis   . Sinus congestion     Past Surgical History:  Procedure Laterality Date  . CATARACT EXTRACTION    . COLONOSCOPY    . LUMBAR LAMINECTOMY/DECOMPRESSION MICRODISCECTOMY Left 05/01/2017   Procedure: Microdiscectomy - Lumbar four-five  - left;  Surgeon: Kary Kos, MD;  Location: [place];  Service: Neurosurgery;  Laterality: Left;  . Right snkle repair      There were no vitals filed for this visit.  Subjective Assessment - 06/18/17 1051    Subjective  I'm doing better today.    Pain Score  2     Pain Location  Leg    Pain Orientation  Left    Pain Descriptors / Indicators  Discomfort    Pain Type  Acute pain    Pain Onset  More than a month ago                      [facility] Adult PT Treatment/Exercise - 06/18/17 0001      Exercises   Exercises  Lumbar      Lumbar Exercises: Aerobic   Nustep  Level 3 x 17 minutes with patient wearing her lumbar corset.      Lumbar Exercises: Supine   Other Supine Lumbar Exercises  Single knee to chest bilateral with 30 sec holds x 4 reps f/b draw-in exercise, multiple reprs to fatigue.      Modalities   Modalities  Electrical Stimulation;Moist Heat      Moist Heat Therapy   Number Minutes Moist Heat  20 Minutes    Moist Heat Location   Lumbar Spine      Electrical Stimulation   Electrical Stimulation Location  Left lumbar.    Electrical Stimulation Action  Pre-mod.    Electrical Stimulation Parameters  80-150 Hz x 20 minutes.    Electrical Stimulation Goals  Pain               PT Short Term Goals - 06/11/17 1054      PT SHORT TERM GOAL #1   Title  I with initial HEP    Time  4    Period  Weeks    Status  New    Target Date  07/09/17      PT SHORT TERM GOAL #2   Title  Patient able to ambulate community distances safely with quad cane.    Time  4    Period  Weeks    Status  New    Target Date  07/09/17        PT Long Term Goals - 06/11/17 1055      PT LONG  TERM GOAL #1   Title  I with advanced HEP including osteoporosis decompression exercises.    Time  8    Period  Weeks    Status  New    Target Date  08/06/17      PT LONG TERM GOAL #2   Title  Patient able to be on her feet for ADLS for 30 min or more with pain 2/10 or less.    Time  8    Period  Weeks    Status  New    Target Date  08/06/17      PT LONG TERM GOAL #3   Title  Patient able to ambulate community distances safely without AD.    Time  8    Period  Weeks    Status  New    Target Date  08/06/17            Plan - 06/18/17 1056    Clinical Impression Statement  Patient did well today.  She is reporting low pain today.  Emphasis placed on hold time of SKTC stretch and correct activation of core musculature in supine without brace during draw-in exercise.    PT Treatment/Interventions  ADLs/Self Care Home Management;Cryotherapy;Electrical Stimulation;Moist Heat;Ultrasound;Therapeutic exercise;Neuromuscular re-education;Patient/family education    PT Next Visit Plan  s/p lumbar discectomy protocol; pain free strengthening for transverse abdominus/back/decompression for OP ; postural strengthening; gait training (wean off walker) modalities for pain prn    PT Home Exercise Plan  TA contraction, bridging and LTR     Consulted and Agree with Plan of Care  Patient       Patient will benefit from skilled therapeutic intervention in order to improve the following deficits and impairments:  Decreased range of motion, Pain, Decreased activity tolerance, Impaired flexibility, Postural dysfunction, Decreased strength  Visit Diagnosis: Acute midline low back pain, with sciatica presence unspecified  Muscle weakness (generalized)  Abnormal posture     Problem List Patient Active Problem List   Diagnosis Date Noted  . HNP (herniated nucleus pulposus), lumbar 05/01/2017  . BMI 29.0-29.9,adult 12/21/2014  . GERD (gastroesophageal reflux disease)   . Hyperlipidemia with target LDL less than 100   . Hypertension   . Osteoporosis     APPLEGATE, [place] MPT 06/18/2017, 11:01 AM  [facility] [place], [place], [address] Phone: [phone]   Fax:  [phone]  Name: SHADAY RAYBORN MRN: [id number] Date of Birth: 1946-02-13

## 2017-06-20 ENCOUNTER — Ambulatory Visit: Payer: Medicare Other | Admitting: Physical Therapy

## 2017-06-20 ENCOUNTER — Encounter: Payer: Self-pay | Admitting: Physical Therapy

## 2017-06-20 DIAGNOSIS — M545 Low back pain: Secondary | ICD-10-CM | POA: Diagnosis not present

## 2017-06-20 DIAGNOSIS — R293 Abnormal posture: Secondary | ICD-10-CM

## 2017-06-20 DIAGNOSIS — M6281 Muscle weakness (generalized): Secondary | ICD-10-CM | POA: Diagnosis not present

## 2017-06-20 NOTE — Therapy (Signed)
[facility] [place], [place], [address] Phone: [phone]   Fax:  [phone]  Physical Therapy Treatment  Patient Details  Name: Cheryl Lee MRN: [id number] Date of Birth: 1946/01/01 Referring Provider: Dr. Kary Kos   Encounter Date: 06/20/2017  PT End of Session - 06/20/17 0917    Visit Number  4    Number of Visits  16    Date for PT Re-Evaluation  08/06/17    PT Start Time  0903    PT Stop Time  0953    PT Time Calculation (min)  50 min    Activity Tolerance  Patient tolerated treatment well    Behavior During Therapy  [facility] for tasks assessed/performed       Past Medical History:  Diagnosis Date  . Acid reflux   . Arthritis   . HNP (herniated nucleus pulposus), lumbar   . Hypercholesteremia   . Hypertension   . Osteoporosis   . Sinus congestion     Past Surgical History:  Procedure Laterality Date  . CATARACT EXTRACTION    . COLONOSCOPY    . LUMBAR LAMINECTOMY/DECOMPRESSION MICRODISCECTOMY Left 05/01/2017   Procedure: Microdiscectomy - Lumbar four-five  - left;  Surgeon: Kary Kos, MD;  Location: [place];  Service: Neurosurgery;  Laterality: Left;  . Right snkle repair      There were no vitals filed for this visit.  Subjective Assessment - 06/20/17 0904    Subjective  Reports she has noticed a little tingling in her low back.    Pertinent History  osteoporosis, R ankle fx from fall 2014    How long can you stand comfortably?  5 min (leg gives out)    How long can you walk comfortably?  2-3 min    Diagnostic tests  MRI showed L4/5 herniated disc    Patient Stated Goals  get rid of pain    Currently in Pain?  Yes    Pain Score  3     Pain Location  -- varies from L ankle to L hip region per patient report with ambulation    Pain Orientation  Left    Pain Descriptors / Indicators  Discomfort    Pain Type  Acute pain    Pain Radiating Towards  from L ankle to L hip region    Pain Onset  More  than a month ago    Pain Frequency  Intermittent    Aggravating Factors   Walking         [facility] PT Assessment - 06/20/17 0001      Assessment   Medical Diagnosis  closed compression fx L4 vertrebra    Onset Date/Surgical Date  05/01/17    Next MD Visit  07/02/2017      Precautions   Precautions  Back    Precaution Comments  no bending, lifting or twisting    Required Braces or Orthoses  Spinal Brace    Spinal Brace  Lumbar corset                  [facility] Adult PT Treatment/Exercise - 06/20/17 0001      Lumbar Exercises: Stretches   Single Knee to Chest Stretch  Left;3 reps;30 seconds      Lumbar Exercises: Aerobic   Nustep  Level 3 x 16 minutes with patient wearing her lumbar corset.      Lumbar Exercises: Supine   Ab Set  10 reps;5 seconds    Glut  Set  10 reps;5 seconds    Clam  15 reps    Bent Knee Raise  15 reps;2 seconds      Modalities   Modalities  Electrical Stimulation;Moist Heat      Moist Heat Therapy   Number Minutes Moist Heat  15 Minutes    Moist Heat Location  Lumbar Spine      Electrical Stimulation   Electrical Stimulation Location  B low back    Electrical Stimulation Action  Pre-Mod    Electrical Stimulation Parameters  80-150 hz x15 min    Electrical Stimulation Goals  Pain               PT Short Term Goals - 06/11/17 1054      PT SHORT TERM GOAL #1   Title  I with initial HEP    Time  4    Period  Weeks    Status  New    Target Date  07/09/17      PT SHORT TERM GOAL #2   Title  Patient able to ambulate community distances safely with quad cane.    Time  4    Period  Weeks    Status  New    Target Date  07/09/17        PT Long Term Goals - 06/11/17 1055      PT LONG TERM GOAL #1   Title  I with advanced HEP including osteoporosis decompression exercises.    Time  8    Period  Weeks    Status  New    Target Date  08/06/17      PT LONG TERM GOAL #2   Title  Patient able to be on her feet for ADLS for 30  min or more with pain 2/10 or less.    Time  8    Period  Weeks    Status  New    Target Date  08/06/17      PT LONG TERM GOAL #3   Title  Patient able to ambulate community distances safely without AD.    Time  8    Period  Weeks    Status  New    Target Date  08/06/17            Plan - 06/20/17 5621    Clinical Impression Statement  Patient tolerated today's treatment well and was able to advanced low level core strengthening exercises. Patient required more VCs for proper ab set which may require further correction in future treatments. Patient demonstrated valsalva maneuver throughout ab set and core activities with VCs and demo of correct technique. With RLE marching with core patient reported when she lifted RLE she felt weakness in LLE. Patient compliant with brace and AD use as well as logrolling for bed mobility. Normal modalities response noted following removal of the modalities.    Rehab Potential  Excellent    PT Frequency  2x / week    PT Duration  8 weeks    PT Treatment/Interventions  ADLs/Self Care Home Management;Cryotherapy;Electrical Stimulation;Moist Heat;Ultrasound;Therapeutic exercise;Neuromuscular re-education;Patient/family education    PT Home Exercise Plan  TA contraction, bridging and LTR    Consulted and Agree with Plan of Care  Patient       Patient will benefit from skilled therapeutic intervention in order to improve the following deficits and impairments:  Decreased range of motion, Pain, Decreased activity tolerance, Impaired flexibility, Postural dysfunction, Decreased strength  Visit Diagnosis: Acute midline  low back pain, with sciatica presence unspecified  Muscle weakness (generalized)  Abnormal posture     Problem List Patient Active Problem List   Diagnosis Date Noted  . HNP (herniated nucleus pulposus), lumbar 05/01/2017  . BMI 29.0-29.9,adult 12/21/2014  . GERD (gastroesophageal reflux disease)   . Hyperlipidemia with target  LDL less than 100   . Hypertension   . Osteoporosis     Standley Brooking, PTA 06/20/2017, 9:57 AM  [facility] [address] [place], [place], [address] Phone: [phone]   Fax:  [phone]  Name: Cheryl Lee MRN: [id number] Date of Birth: Sep 22, 1945

## 2017-06-25 ENCOUNTER — Ambulatory Visit: Payer: Medicare Other | Admitting: Physical Therapy

## 2017-06-25 ENCOUNTER — Encounter: Payer: Self-pay | Admitting: Physical Therapy

## 2017-06-25 DIAGNOSIS — M545 Low back pain: Secondary | ICD-10-CM | POA: Diagnosis not present

## 2017-06-25 DIAGNOSIS — R293 Abnormal posture: Secondary | ICD-10-CM | POA: Diagnosis not present

## 2017-06-25 DIAGNOSIS — M6281 Muscle weakness (generalized): Secondary | ICD-10-CM | POA: Diagnosis not present

## 2017-06-25 NOTE — Therapy (Signed)
[facility] [place], [place], [address] Phone: [phone]   Fax:  [phone]  Physical Therapy Treatment  Patient Details  Name: Cheryl Lee MRN: [id number] Date of Birth: 04/30/1946 Referring Provider: Dr. Kary Kos   Encounter Date: 06/25/2017  PT End of Session - 06/25/17 0912    Visit Number  5    Number of Visits  16    Date for PT Re-Evaluation  08/06/17    PT Start Time  0905    PT Stop Time  0955    PT Time Calculation (min)  50 min    Activity Tolerance  Patient tolerated treatment well    Behavior During Therapy  [facility] for tasks assessed/performed       Past Medical History:  Diagnosis Date  . Acid reflux   . Arthritis   . HNP (herniated nucleus pulposus), lumbar   . Hypercholesteremia   . Hypertension   . Osteoporosis   . Sinus congestion     Past Surgical History:  Procedure Laterality Date  . CATARACT EXTRACTION    . COLONOSCOPY    . LUMBAR LAMINECTOMY/DECOMPRESSION MICRODISCECTOMY Left 05/01/2017   Procedure: Microdiscectomy - Lumbar four-five  - left;  Surgeon: Kary Kos, MD;  Location: [place];  Service: Neurosurgery;  Laterality: Left;  . Right snkle repair      There were no vitals filed for this visit.  Subjective Assessment - 06/25/17 0911    Subjective  Reports that upon waking her LLE hurts and low back is stiff. Reports that [facility] to LLE makes her neck and shoulder sore.    Pertinent History  osteoporosis, R ankle fx from fall 2014    How long can you stand comfortably?  5 min (leg gives out)    How long can you walk comfortably?  2-3 min    Diagnostic tests  MRI showed L4/5 herniated disc    Patient Stated Goals  get rid of pain    Currently in Pain?  Other (Comment) No pain assessment provided by patient upon arrival         [facility] PT Assessment - 06/25/17 0001      Assessment   Medical Diagnosis  closed compression fx L4 vertrebra    Onset Date/Surgical Date  05/01/17     Next MD Visit  07/02/2017      Precautions   Precautions  Back    Precaution Comments  no bending, lifting or twisting    Required Braces or Orthoses  Spinal Brace    Spinal Brace  Lumbar corset                  [facility] Adult PT Treatment/Exercise - 06/25/17 0001      Lumbar Exercises: Aerobic   Nustep  L4 x17 min      Lumbar Exercises: Seated   Long Arc Quad on Chair  Strengthening;Both;20 reps;Weights    LAQ on Chair Weights (lbs)  3    Sit to Stand  10 reps      Lumbar Exercises: Supine   Pelvic Tilt  15 reps;5 seconds    Clam  20 reps    Bent Knee Raise  15 reps      Modalities   Modalities  Electrical Stimulation;Moist Heat      Moist Heat Therapy   Number Minutes Moist Heat  15 Minutes    Moist Heat Location  Lumbar Spine      Electrical Stimulation   Electrical  Stimulation Location  B low back    Electrical Stimulation Action  Pre-Mod    Electrical Stimulation Parameters  80-150 hz x15 min    Electrical Stimulation Goals  Pain               PT Short Term Goals - 06/11/17 1054      PT SHORT TERM GOAL #1   Title  I with initial HEP    Time  4    Period  Weeks    Status  New    Target Date  07/09/17      PT SHORT TERM GOAL #2   Title  Patient able to ambulate community distances safely with quad cane.    Time  4    Period  Weeks    Status  New    Target Date  07/09/17        PT Long Term Goals - 06/11/17 1055      PT LONG TERM GOAL #1   Title  I with advanced HEP including osteoporosis decompression exercises.    Time  8    Period  Weeks    Status  New    Target Date  08/06/17      PT LONG TERM GOAL #2   Title  Patient able to be on her feet for ADLS for 30 min or more with pain 2/10 or less.    Time  8    Period  Weeks    Status  New    Target Date  08/06/17      PT LONG TERM GOAL #3   Title  Patient able to ambulate community distances safely without AD.    Time  8    Period  Weeks    Status  New    Target Date   08/06/17            Plan - 06/25/17 0945    Clinical Impression Statement  Patient tolerated today's treatment fairly well as she arrived with reports of stiffness in low back. Patient intermittantly reports LLE discomfort/ throbbing sensation. Greatest focus of today's treatment was placed in proper core activation. Greatest improvement seen following tactle instruction for posterior pelvic tilt in supine. Patient progressed through other supine exercises with instruction to maintain the pelvic tilt with other exercises. Patient still guarded with transition movements at this time. Normal modalities response. Still compliant with lumbar corset use as well as walker use.    Rehab Potential  Excellent    PT Frequency  2x / week    PT Duration  8 weeks    PT Treatment/Interventions  ADLs/Self Care Home Management;Cryotherapy;Electrical Stimulation;Moist Heat;Ultrasound;Therapeutic exercise;Neuromuscular re-education;Patient/family education    PT Next Visit Plan  s/p lumbar discectomy protocol; pain free strengthening for transverse abdominus/back/decompression for OP ; postural strengthening; gait training (wean off walker) modalities for pain prn    PT Home Exercise Plan  TA contraction, bridging and LTR    Consulted and Agree with Plan of Care  Patient       Patient will benefit from skilled therapeutic intervention in order to improve the following deficits and impairments:  Decreased range of motion, Pain, Decreased activity tolerance, Impaired flexibility, Postural dysfunction, Decreased strength  Visit Diagnosis: Acute midline low back pain, with sciatica presence unspecified  Muscle weakness (generalized)  Abnormal posture     Problem List Patient Active Problem List   Diagnosis Date Noted  . HNP (herniated nucleus pulposus), lumbar 05/01/2017  . BMI 29.0-29.9,adult  12/21/2014  . GERD (gastroesophageal reflux disease)   . Hyperlipidemia with target LDL less than 100   .  Hypertension   . Osteoporosis     Standley Brooking, PTA 06/25/2017, 10:02 AM  [facility] [address] [place], [place], [address] Phone: [phone]   Fax:  [phone]  Name: Cheryl Lee MRN: [id number] Date of Birth: [birth year]

## 2017-06-27 ENCOUNTER — Encounter: Payer: Self-pay | Admitting: Physical Therapy

## 2017-06-27 ENCOUNTER — Ambulatory Visit: Payer: Medicare Other | Admitting: Physical Therapy

## 2017-06-27 DIAGNOSIS — M6281 Muscle weakness (generalized): Secondary | ICD-10-CM | POA: Diagnosis not present

## 2017-06-27 DIAGNOSIS — R293 Abnormal posture: Secondary | ICD-10-CM

## 2017-06-27 DIAGNOSIS — M545 Low back pain: Secondary | ICD-10-CM

## 2017-06-27 NOTE — Therapy (Signed)
[facility] [place], [place], [address] Phone: [phone]   Fax:  [phone]  Physical Therapy Treatment  Patient Details  Name: Cheryl Lee MRN: [id number] Date of Birth: February 16, 1946 Referring Provider: Dr. Kary Kos   Encounter Date: 06/27/2017  PT End of Session - 06/27/17 0909    Visit Number  6    Number of Visits  16    Date for PT Re-Evaluation  08/06/17    PT Start Time  0901    PT Stop Time  0951    PT Time Calculation (min)  50 min    Activity Tolerance  Patient tolerated treatment well    Behavior During Therapy  [facility] for tasks assessed/performed       Past Medical History:  Diagnosis Date  . Acid reflux   . Arthritis   . HNP (herniated nucleus pulposus), lumbar   . Hypercholesteremia   . Hypertension   . Osteoporosis   . Sinus congestion     Past Surgical History:  Procedure Laterality Date  . CATARACT EXTRACTION    . COLONOSCOPY    . LUMBAR LAMINECTOMY/DECOMPRESSION MICRODISCECTOMY Left 05/01/2017   Procedure: Microdiscectomy - Lumbar four-five  - left;  Surgeon: Kary Kos, MD;  Location: [place];  Service: Neurosurgery;  Laterality: Left;  . Right snkle repair      There were no vitals filed for this visit.  Subjective Assessment - 06/27/17 0853    Subjective  Reports feeling stiff upon waking but also has experienced L knee giving out on her since waking.    Pertinent History  osteoporosis, R ankle fx from fall 2014    How long can you stand comfortably?  5 min (leg gives out)    How long can you walk comfortably?  2-3 min    Diagnostic tests  MRI showed L4/5 herniated disc    Patient Stated Goals  get rid of pain    Currently in Pain?  Other (Comment) No pain assessment provided by patient upon arrival         [facility] PT Assessment - 06/27/17 0001      Assessment   Medical Diagnosis  closed compression fx L4 vertrebra    Onset Date/Surgical Date  05/01/17    Next MD Visit   07/02/2017      Precautions   Precautions  Back    Precaution Comments  no bending, lifting or twisting    Required Braces or Orthoses  Spinal Brace    Spinal Brace  Lumbar corset                  [facility] Adult PT Treatment/Exercise - 06/27/17 0001      Lumbar Exercises: Stretches   Passive Hamstring Stretch  Left;3 reps;30 seconds      Lumbar Exercises: Aerobic   Nustep  L4 x17 min      Lumbar Exercises: Supine   Pelvic Tilt  10 reps;5 seconds    Clam  20 reps    Bent Knee Raise  15 reps    Straight Leg Raise  10 reps      Modalities   Modalities  Electrical Stimulation;Moist Heat      Moist Heat Therapy   Number Minutes Moist Heat  15 Minutes    Moist Heat Location  Lumbar Spine      Electrical Stimulation   Electrical Stimulation Location  B low back    Electrical Stimulation Action  Pre-Mod  Electrical Stimulation Parameters  80-150 hz x15 min    Electrical Stimulation Goals  Pain               PT Short Term Goals - 06/11/17 1054      PT SHORT TERM GOAL #1   Title  I with initial HEP    Time  4    Period  Weeks    Status  New    Target Date  07/09/17      PT SHORT TERM GOAL #2   Title  Patient able to ambulate community distances safely with quad cane.    Time  4    Period  Weeks    Status  New    Target Date  07/09/17        PT Long Term Goals - 06/11/17 1055      PT LONG TERM GOAL #1   Title  I with advanced HEP including osteoporosis decompression exercises.    Time  8    Period  Weeks    Status  New    Target Date  08/06/17      PT LONG TERM GOAL #2   Title  Patient able to be on her feet for ADLS for 30 min or more with pain 2/10 or less.    Time  8    Period  Weeks    Status  New    Target Date  08/06/17      PT LONG TERM GOAL #3   Title  Patient able to ambulate community distances safely without AD.    Time  8    Period  Weeks    Status  New    Target Date  08/06/17            Plan - 06/27/17 0945     Clinical Impression Statement  Patient presented in clinic with continued reports of lumbar stiffness upon waking and continued pain down LLE. Patient reported discomfort with passive L HS stretch as well as during SLR. Continued emphasis placed on proper core activation with verbal and tactile cueing for PPT technique throughout treatment. Normal modalities response noted following removal of the modalities.     Rehab Potential  Excellent    PT Frequency  2x / week    PT Duration  8 weeks    PT Treatment/Interventions  ADLs/Self Care Home Management;Cryotherapy;Electrical Stimulation;Moist Heat;Ultrasound;Therapeutic exercise;Neuromuscular re-education;Patient/family education    PT Next Visit Plan  s/p lumbar discectomy protocol; pain free strengthening for transverse abdominus/back/decompression for OP ; postural strengthening; gait training (wean off walker) modalities for pain prn    PT Home Exercise Plan  TA contraction, bridging and LTR    Consulted and Agree with Plan of Care  Patient       Patient will benefit from skilled therapeutic intervention in order to improve the following deficits and impairments:  Decreased range of motion, Pain, Decreased activity tolerance, Impaired flexibility, Postural dysfunction, Decreased strength  Visit Diagnosis: Acute midline low back pain, with sciatica presence unspecified  Muscle weakness (generalized)  Abnormal posture     Problem List Patient Active Problem List   Diagnosis Date Noted  . HNP (herniated nucleus pulposus), lumbar 05/01/2017  . BMI 29.0-29.9,adult 12/21/2014  . GERD (gastroesophageal reflux disease)   . Hyperlipidemia with target LDL less than 100   . Hypertension   . Osteoporosis     Standley Brooking, PTA 06/27/2017, 10:00 AM  [facility]  [place], [place], [address] Phone: [phone]   Fax:  [phone]  Name: Cheryl Lee MRN: [id number] Date of  Birth: [birth year]

## 2017-07-02 ENCOUNTER — Encounter: Payer: Self-pay | Admitting: Physical Therapy

## 2017-07-02 ENCOUNTER — Ambulatory Visit: Payer: Medicare Other | Admitting: Physical Therapy

## 2017-07-02 DIAGNOSIS — M545 Low back pain: Secondary | ICD-10-CM

## 2017-07-02 DIAGNOSIS — R293 Abnormal posture: Secondary | ICD-10-CM

## 2017-07-02 DIAGNOSIS — M6281 Muscle weakness (generalized): Secondary | ICD-10-CM

## 2017-07-02 DIAGNOSIS — M5440 Lumbago with sciatica, unspecified side: Secondary | ICD-10-CM | POA: Diagnosis not present

## 2017-07-02 NOTE — Therapy (Signed)
[facility] [place], [place], [address] Phone: [phone]   Fax:  [phone]  Physical Therapy Treatment  Patient Details  Name: Cheryl Lee MRN: [id number] Date of Birth: June 24, 1945 Referring Provider: Dr. Kary Kos   Encounter Date: 07/02/2017  PT End of Session - 07/02/17 0906    Visit Number  7    Number of Visits  16    Date for PT Re-Evaluation  08/06/17    PT Start Time  0902    PT Stop Time  0954    PT Time Calculation (min)  52 min    Activity Tolerance  Patient tolerated treatment well    Behavior During Therapy  [facility] for tasks assessed/performed       Past Medical History:  Diagnosis Date  . Acid reflux   . Arthritis   . HNP (herniated nucleus pulposus), lumbar   . Hypercholesteremia   . Hypertension   . Osteoporosis   . Sinus congestion     Past Surgical History:  Procedure Laterality Date  . CATARACT EXTRACTION    . COLONOSCOPY    . LUMBAR LAMINECTOMY/DECOMPRESSION MICRODISCECTOMY Left 05/01/2017   Procedure: Microdiscectomy - Lumbar four-five  - left;  Surgeon: Kary Kos, MD;  Location: [place];  Service: Neurosurgery;  Laterality: Left;  . Right snkle repair      There were no vitals filed for this visit.  Subjective Assessment - 07/02/17 0902    Subjective  Reports that she is still having aching pain in LLE and was sore from L calf. Reports that she took a muscle relaxer but has not taken any medication in two days.    Pertinent History  osteoporosis, R ankle fx from fall 2014    How long can you stand comfortably?  5 min (leg gives out)    How long can you walk comfortably?  2-3 min    Diagnostic tests  MRI showed L4/5 herniated disc    Patient Stated Goals  get rid of pain    Currently in Pain?  Yes    Pain Score  3     Pain Location  Leg    Pain Orientation  Left    Pain Descriptors / Indicators  Aching    Pain Type  Acute pain    Pain Onset  More than a month ago    Pain  Frequency  Intermittent    Aggravating Factors   Walking         [facility] PT Assessment - 07/02/17 0001      Assessment   Medical Diagnosis  closed compression fx L4 vertrebra    Onset Date/Surgical Date  05/01/17    Next MD Visit  07/02/2017      Precautions   Precautions  Back    Precaution Comments  no bending, lifting or twisting    Required Braces or Orthoses  Spinal Brace    Spinal Brace  Lumbar corset                  [facility] Adult PT Treatment/Exercise - 07/02/17 0001      Lumbar Exercises: Stretches   Passive Hamstring Stretch  Left;3 reps;30 seconds      Lumbar Exercises: Aerobic   Nustep  L4 x15 min      Lumbar Exercises: Standing   Other Standing Lumbar Exercises  Rockerboard x2 min for calf stretch      Lumbar Exercises: Seated   Long Arc Quad on Chair  Strengthening;Both;20 reps;Weights    LAQ on Chair Weights (lbs)  3    Sit to Stand  15 reps      Lumbar Exercises: Supine   Bent Knee Raise  15 reps    Bridge  15 reps    Straight Leg Raise  10 reps    Other Supine Lumbar Exercises  L sciatic glide with assist from PTA x10 rep      Modalities   Modalities  Electrical Stimulation;Moist Heat      Moist Heat Therapy   Number Minutes Moist Heat  15 Minutes    Moist Heat Location  Lumbar Spine      Electrical Stimulation   Electrical Stimulation Location  B low back    Electrical Stimulation Action  Pre-Mod    Electrical Stimulation Parameters  80-150 hz x15 min    Electrical Stimulation Goals  Pain               PT Short Term Goals - 07/02/17 0950      PT SHORT TERM GOAL #1   Title  I with initial HEP    Time  4    Period  Weeks    Status  Achieved      PT SHORT TERM GOAL #2   Title  Patient able to ambulate community distances safely with quad cane.    Time  4    Period  Weeks    Status  On-going FWW used as of 07/02/2017        PT Long Term Goals - 07/02/17 0950      PT LONG TERM GOAL #1   Title  I with advanced HEP  including osteoporosis decompression exercises.    Time  8    Period  Weeks    Status  On-going      PT LONG TERM GOAL #2   Title  Patient able to be on her feet for ADLS for 30 min or more with pain 2/10 or less.    Time  8    Period  Weeks    Status  On-going      PT LONG TERM GOAL #3   Title  Patient able to ambulate community distances safely without AD.    Time  8    Period  Weeks    Status  On-going            Plan - 07/02/17 6433    Clinical Impression Statement  Patient tolerated today's treatment fairly well although she is still presenting in clinic with reports of low level LLE aching that occurs during ambulation. Patient still diligent with lumbar corset use and FWW. Patient still requiring greater tactile and verbal cueing to promote proper core activation. Patient limited with SLRs secondary to weakness and reported "a little" LBP during bridges. Normal modalities response noted following removal of the modalities.    Rehab Potential  Excellent    PT Frequency  2x / week    PT Duration  8 weeks    PT Treatment/Interventions  ADLs/Self Care Home Management;Cryotherapy;Electrical Stimulation;Moist Heat;Ultrasound;Therapeutic exercise;Neuromuscular re-education;Patient/family education    PT Next Visit Plan  s/p lumbar discectomy protocol; pain free strengthening for transverse abdominus/back/decompression for OP ; postural strengthening; gait training (wean off walker) modalities for pain prn    PT Home Exercise Plan  TA contraction, bridging and LTR    Consulted and Agree with Plan of Care  Patient       Patient will benefit from  skilled therapeutic intervention in order to improve the following deficits and impairments:  Decreased range of motion, Pain, Decreased activity tolerance, Impaired flexibility, Postural dysfunction, Decreased strength  Visit Diagnosis: Acute midline low back pain, with sciatica presence unspecified  Muscle weakness  (generalized)  Abnormal posture     Problem List Patient Active Problem List   Diagnosis Date Noted  . HNP (herniated nucleus pulposus), lumbar 05/01/2017  . BMI 29.0-29.9,adult 12/21/2014  . GERD (gastroesophageal reflux disease)   . Hyperlipidemia with target LDL less than 100   . Hypertension   . Osteoporosis    Standley Brooking, PTA 07/02/17 10:03 AM    [facility] [address] [place], [place], [address] Phone: [phone]   Fax:  [phone]  Name: Cheryl Lee MRN: [id number] Date of Birth: 1946-04-13

## 2017-07-04 ENCOUNTER — Ambulatory Visit: Payer: Medicare Other | Admitting: *Deleted

## 2017-07-04 DIAGNOSIS — M545 Low back pain: Secondary | ICD-10-CM | POA: Diagnosis not present

## 2017-07-04 DIAGNOSIS — M6281 Muscle weakness (generalized): Secondary | ICD-10-CM | POA: Diagnosis not present

## 2017-07-04 DIAGNOSIS — R293 Abnormal posture: Secondary | ICD-10-CM | POA: Diagnosis not present

## 2017-07-04 NOTE — Therapy (Signed)
[facility] [place], [place], [address] Phone: [phone]   Fax:  [phone]  Physical Therapy Treatment  Patient Details  Name: Cheryl Lee MRN: [id number] Date of Birth: 21-Sep-1945 Referring Provider: Dr. Kary Kos   Encounter Date: 07/04/2017  PT End of Session - 07/04/17 6962    Visit Number  8    Number of Visits  66 New MD Order    Date for PT Re-Evaluation  08/13/17    PT Start Time  0900    PT Stop Time  0950    PT Time Calculation (min)  50 min       Past Medical History:  Diagnosis Date  . Acid reflux   . Arthritis   . HNP (herniated nucleus pulposus), lumbar   . Hypercholesteremia   . Hypertension   . Osteoporosis   . Sinus congestion     Past Surgical History:  Procedure Laterality Date  . CATARACT EXTRACTION    . COLONOSCOPY    . LUMBAR LAMINECTOMY/DECOMPRESSION MICRODISCECTOMY Left 05/01/2017   Procedure: Microdiscectomy - Lumbar four-five  - left;  Surgeon: Kary Kos, MD;  Location: [place];  Service: Neurosurgery;  Laterality: Left;  . Right snkle repair      There were no vitals filed for this visit.  Subjective Assessment - 07/04/17 0921    Subjective  Reports that she is still having aching pain in LLE and was sore from L calf. Reports that she took a muscle relaxer but has not taken any medication in two days.    How long can you stand comfortably?  5 min (leg gives out)    How long can you walk comfortably?  2-3 min    Diagnostic tests  MRI showed L4/5 herniated disc    Patient Stated Goals  get rid of pain    Currently in Pain?  Yes    Pain Score  2     Pain Location  Leg    Pain Orientation  Left    Pain Descriptors / Indicators  Aching    Pain Type  Acute pain    Pain Frequency  Intermittent                      [facility] Adult PT Treatment/Exercise - 07/04/17 0001      Lumbar Exercises: Aerobic   Nustep  L4 x15 min brace on      Lumbar Exercises: Standing    Other Standing Lumbar Exercises  Rockerboard x2 min for calf stretch    Other Standing Lumbar Exercises  marching in place 2x10      Lumbar Exercises: Seated   Long Arc Quad on Chair  Strengthening;Both;20 reps;Weights;10 reps    LAQ on Chair Weights (lbs)  3    Sit to Stand  20 reps      Lumbar Exercises: Supine   Bent Knee Raise  --    Bridge  --    Straight Leg Raise  --    Other Supine Lumbar Exercises  --      Modalities   Modalities  Electrical Stimulation;Moist Heat      Moist Heat Therapy   Number Minutes Moist Heat  15 Minutes    Moist Heat Location  Lumbar Spine      Electrical Stimulation   Electrical Stimulation Location  B low back Premod x 15 mins 80-150hz      Electrical Stimulation Goals  Pain  PT Short Term Goals - 07/02/17 0950      PT SHORT TERM GOAL #1   Title  I with initial HEP    Time  4    Period  Weeks    Status  Achieved      PT SHORT TERM GOAL #2   Title  Patient able to ambulate community distances safely with quad cane.    Time  4    Period  Weeks    Status  On-going FWW used as of 07/02/2017        PT Long Term Goals - 07/02/17 0950      PT LONG TERM GOAL #1   Title  I with advanced HEP including osteoporosis decompression exercises.    Time  8    Period  Weeks    Status  On-going      PT LONG TERM GOAL #2   Title  Patient able to be on her feet for ADLS for 30 min or more with pain 2/10 or less.    Time  8    Period  Weeks    Status  On-going      PT LONG TERM GOAL #3   Title  Patient able to ambulate community distances safely without AD.    Time  8    Period  Weeks    Status  On-going            Plan - 07/04/17 9417    Clinical Impression Statement  Pt arrived today doing fairly well with LBP and LT leg pain being a little less. She continues to wear the brace  most of the time during the day. She was able to complete all therex for core and lumbar strengthening. without complaints of pain.  She mainly complained of LE fatigue. Able to perform sit to stand with no UE assist and maintain balance. Her chief complaint was fatigue.    Clinical Presentation  Stable    Clinical Decision Making  Low    Rehab Potential  Excellent    PT Frequency  2x / week    PT Duration  8 weeks    PT Treatment/Interventions  ADLs/Self Care Home Management;Cryotherapy;Electrical Stimulation;Moist Heat;Ultrasound;Therapeutic exercise;Neuromuscular re-education;Patient/family education    PT Next Visit Plan  s/p lumbar discectomy protocol; pain free strengthening for transverse abdominus/back/decompression for OP ; postural strengthening; gait training (wean off walker) modalities for pain prn    PT Home Exercise Plan  TA contraction, bridging     Consulted and Agree with Plan of Care  Patient       Patient will benefit from skilled therapeutic intervention in order to improve the following deficits and impairments:  Decreased range of motion, Pain, Decreased activity tolerance, Impaired flexibility, Postural dysfunction, Decreased strength  Visit Diagnosis: Acute midline low back pain, with sciatica presence unspecified  Muscle weakness (generalized)  Abnormal posture     Problem List Patient Active Problem List   Diagnosis Date Noted  . HNP (herniated nucleus pulposus), lumbar 05/01/2017  . BMI 29.0-29.9,adult 12/21/2014  . GERD (gastroesophageal reflux disease)   . Hyperlipidemia with target LDL less than 100   . Hypertension   . Osteoporosis     Shriyans Kuenzi,CHRIS, PTA 07/04/2017, 10:11 AM  [facility] [place], [place], [address] Phone: [phone]   Fax:  [phone]  Name: Cheryl Lee MRN: [id number] Date of Birth: 22-Dec-1945

## 2017-07-09 ENCOUNTER — Ambulatory Visit: Payer: Medicare Other | Admitting: Physical Therapy

## 2017-07-09 ENCOUNTER — Encounter: Payer: Self-pay | Admitting: Physical Therapy

## 2017-07-09 DIAGNOSIS — R293 Abnormal posture: Secondary | ICD-10-CM

## 2017-07-09 DIAGNOSIS — M545 Low back pain: Secondary | ICD-10-CM

## 2017-07-09 DIAGNOSIS — M6281 Muscle weakness (generalized): Secondary | ICD-10-CM

## 2017-07-09 NOTE — Therapy (Signed)
[facility] [place], [place], [address] Phone: [phone]   Fax:  [phone]  Physical Therapy Treatment  Patient Details  Name: Cheryl Lee MRN: [id number] Date of Birth: Jun 19, 1945 Referring Provider: Dr. Kary Kos   Encounter Date: 07/09/2017  PT End of Session - 07/09/17 0912    Visit Number  9    Number of Visits  20    Date for PT Re-Evaluation  08/13/17    PT Start Time  0903    PT Stop Time  0956    PT Time Calculation (min)  53 min    Activity Tolerance  Patient tolerated treatment well    Behavior During Therapy  [facility] for tasks assessed/performed       Past Medical History:  Diagnosis Date  . Acid reflux   . Arthritis   . HNP (herniated nucleus pulposus), lumbar   . Hypercholesteremia   . Hypertension   . Osteoporosis   . Sinus congestion     Past Surgical History:  Procedure Laterality Date  . CATARACT EXTRACTION    . COLONOSCOPY    . LUMBAR LAMINECTOMY/DECOMPRESSION MICRODISCECTOMY Left 05/01/2017   Procedure: Microdiscectomy - Lumbar four-five  - left;  Surgeon: Kary Kos, MD;  Location: [place];  Service: Neurosurgery;  Laterality: Left;  . Right snkle repair      There were no vitals filed for this visit.  Subjective Assessment - 07/09/17 0910    Subjective  Reports that yesterday she reported having  tingling sensation in L low back and a sensation that would take her breath and she is hoping that is signs of healing.    Pertinent History  osteoporosis, R ankle fx from fall 2014    How long can you stand comfortably?  5 min (leg gives out)    How long can you walk comfortably?  2-3 min    Diagnostic tests  MRI showed L4/5 herniated disc    Patient Stated Goals  get rid of pain    Currently in Pain?  Yes    Pain Score  2     Pain Location  Back    Pain Orientation  Left;Lower    Pain Descriptors / Indicators  Discomfort    Pain Type  Acute pain    Pain Onset  More than a month ago          [facility] PT Assessment - 07/09/17 0001      Assessment   Medical Diagnosis  closed compression fx L4 vertrebra    Onset Date/Surgical Date  05/01/17    Next MD Visit  08/13/2017      Precautions   Precautions  Back    Precaution Comments  no bending, lifting or twisting    Required Braces or Orthoses  Spinal Brace    Spinal Brace  Lumbar corset                  [facility] Adult PT Treatment/Exercise - 07/09/17 0001      Lumbar Exercises: Aerobic   Nustep  L4 x16 min brace on      Lumbar Exercises: Standing   Heel Raises  20 reps    Other Standing Lumbar Exercises  Rockerboard x2 min    Other Standing Lumbar Exercises  Standing marching x15 reps      Lumbar Exercises: Seated   Long Arc Quad on Chair  Strengthening;Both;20 reps;Weights;10 reps    LAQ on Chair Weights (lbs)  3  Sit to Stand  10 reps      Modalities   Modalities  Electrical Stimulation;Moist Heat      Moist Heat Therapy   Number Minutes Moist Heat  15 Minutes    Moist Heat Location  Lumbar Spine      Electrical Stimulation   Electrical Stimulation Location  B low back    Electrical Stimulation Action  Pre-Mod    Electrical Stimulation Parameters  80-150 hz x15 min    Electrical Stimulation Goals  Pain               PT Short Term Goals - 07/02/17 0950      PT SHORT TERM GOAL #1   Title  I with initial HEP    Time  4    Period  Weeks    Status  Achieved      PT SHORT TERM GOAL #2   Title  Patient able to ambulate community distances safely with quad cane.    Time  4    Period  Weeks    Status  On-going FWW used as of 07/02/2017        PT Long Term Goals - 07/02/17 0950      PT LONG TERM GOAL #1   Title  I with advanced HEP including osteoporosis decompression exercises.    Time  8    Period  Weeks    Status  On-going      PT LONG TERM GOAL #2   Title  Patient able to be on her feet for ADLS for 30 min or more with pain 2/10 or less.    Time  8    Period  Weeks     Status  On-going      PT LONG TERM GOAL #3   Title  Patient able to ambulate community distances safely without AD.    Time  8    Period  Weeks    Status  On-going              Patient will benefit from skilled therapeutic intervention in order to improve the following deficits and impairments:     Visit Diagnosis: Acute midline low back pain, with sciatica presence unspecified  Muscle weakness (generalized)  Abnormal posture     Problem List Patient Active Problem List   Diagnosis Date Noted  . HNP (herniated nucleus pulposus), lumbar 05/01/2017  . BMI 29.0-29.9,adult 12/21/2014  . GERD (gastroesophageal reflux disease)   . Hyperlipidemia with target LDL less than 100   . Hypertension   . Osteoporosis     Standley Brooking, PTA 07/09/2017, 9:59 AM  [facility] [address] [place], [place], [address] Phone: [phone]   Fax:  [phone]  Name: Cheryl Lee MRN: [id number] Date of Birth: 24-Oct-1945

## 2017-07-11 ENCOUNTER — Ambulatory Visit: Payer: Medicare Other | Admitting: Physical Therapy

## 2017-07-11 ENCOUNTER — Encounter: Payer: Self-pay | Admitting: Physical Therapy

## 2017-07-11 DIAGNOSIS — M545 Low back pain: Secondary | ICD-10-CM

## 2017-07-11 DIAGNOSIS — M6281 Muscle weakness (generalized): Secondary | ICD-10-CM | POA: Diagnosis not present

## 2017-07-11 DIAGNOSIS — R293 Abnormal posture: Secondary | ICD-10-CM

## 2017-07-11 NOTE — Therapy (Signed)
[facility] [place], [place], [address] Phone: [phone]   Fax:  [phone]  Physical Therapy Treatment  Patient Details  Name: Cheryl Lee MRN: [id number] Date of Birth: 1945/05/24 Referring Provider: Dr. Kary Kos   Encounter Date: 07/11/2017  PT End of Session - 07/11/17 0916    Visit Number  10    Number of Visits  20    Date for PT Re-Evaluation  08/13/17    PT Start Time  0903    PT Stop Time  0952    PT Time Calculation (min)  49 min    Activity Tolerance  Patient tolerated treatment well    Behavior During Therapy  [facility] for tasks assessed/performed       Past Medical History:  Diagnosis Date  . Acid reflux   . Arthritis   . HNP (herniated nucleus pulposus), lumbar   . Hypercholesteremia   . Hypertension   . Osteoporosis   . Sinus congestion     Past Surgical History:  Procedure Laterality Date  . CATARACT EXTRACTION    . COLONOSCOPY    . LUMBAR LAMINECTOMY/DECOMPRESSION MICRODISCECTOMY Left 05/01/2017   Procedure: Microdiscectomy - Lumbar four-five  - left;  Surgeon: Kary Kos, MD;  Location: [facility];  Service: Neurosurgery;  Laterality: Left;  . Right snkle repair      There were no vitals filed for this visit.  Subjective Assessment - 07/11/17 0914    Subjective  Reports that she had some soreness from L low back down LLE but took two tylenols prior to coming to PT. Reportst stiffness upon waking.    Pertinent History  osteoporosis, R ankle fx from fall 2014    How long can you stand comfortably?  5 min (leg gives out)    How long can you walk comfortably?  2-3 min    Diagnostic tests  MRI showed L4/5 herniated disc    Patient Stated Goals  get rid of pain    Currently in Pain?  Other (Comment) No pain assessment provided by patient         [facility] PT Assessment - 07/11/17 0001      Assessment   Medical Diagnosis  closed compression fx L4 vertrebra    Onset Date/Surgical Date   05/01/17    Next MD Visit  08/13/2017      Precautions   Precautions  Back    Precaution Comments  no bending, lifting or twisting    Required Braces or Orthoses  Spinal Brace    Spinal Brace  Lumbar corset                  [facility] Adult PT Treatment/Exercise - 07/11/17 0001      Lumbar Exercises: Aerobic   Nustep  L4 x17 min brace on      Lumbar Exercises: Standing   Other Standing Lumbar Exercises  B hip abduction x15 reps each      Lumbar Exercises: Seated   Long Arc Quad on Chair  Strengthening;Both;20 reps;Weights;10 reps    LAQ on Chair Weights (lbs)  4    Sit to Stand  20 reps    Other Seated Lumbar Exercises  Seated B shoulder row red theraband x20 reps      Lumbar Exercises: Supine   Bridge  20 reps      Modalities   Modalities  Electrical Stimulation;Moist Heat      Moist Heat Therapy   Number Minutes Moist Heat  15 Minutes    Moist Heat Location  Lumbar Spine      Electrical Stimulation   Electrical Stimulation Location  B low back    Electrical Stimulation Action  Pre-Mod    Electrical Stimulation Parameters  80-150 hz x15 min    Electrical Stimulation Goals  Pain               PT Short Term Goals - 07/02/17 0950      PT SHORT TERM GOAL #1   Title  I with initial HEP    Time  4    Period  Weeks    Status  Achieved      PT SHORT TERM GOAL #2   Title  Patient able to ambulate community distances safely with quad cane.    Time  4    Period  Weeks    Status  On-going FWW used as of 07/02/2017        PT Long Term Goals - 07/02/17 0950      PT LONG TERM GOAL #1   Title  I with advanced HEP including osteoporosis decompression exercises.    Time  8    Period  Weeks    Status  On-going      PT LONG TERM GOAL #2   Title  Patient able to be on her feet for ADLS for 30 min or more with pain 2/10 or less.    Time  8    Period  Weeks    Status  On-going      PT LONG TERM GOAL #3   Title  Patient able to ambulate community  distances safely without AD.    Time  8    Period  Weeks    Status  On-going            Plan - 07/11/17 1036    Clinical Impression Statement  Patient arrived today with reports of soreness from L low back down LLE. Patient able to complete sit to stands very well today and faster than she has in previous treatments. VCs for core activation provided by PTA throughout treatment to patient. Only minimal LBP noted with R hip abduction in standing. Normal modalities response noted following removal of the modalities .    Rehab Potential  Excellent    PT Frequency  2x / week    PT Duration  8 weeks    PT Treatment/Interventions  ADLs/Self Care Home Management;Cryotherapy;Electrical Stimulation;Moist Heat;Ultrasound;Therapeutic exercise;Neuromuscular re-education;Patient/family education    PT Next Visit Plan  s/p lumbar discectomy protocol; pain free strengthening for transverse abdominus/back/decompression for OP ; postural strengthening; gait training (wean off walker) modalities for pain prn    PT Home Exercise Plan  TA contraction, bridging     Consulted and Agree with Plan of Care  Patient       Patient will benefit from skilled therapeutic intervention in order to improve the following deficits and impairments:  Decreased range of motion, Pain, Decreased activity tolerance, Impaired flexibility, Postural dysfunction, Decreased strength  Visit Diagnosis: Acute midline low back pain, with sciatica presence unspecified  Muscle weakness (generalized)  Abnormal posture     Problem List Patient Active Problem List   Diagnosis Date Noted  . HNP (herniated nucleus pulposus), lumbar 05/01/2017  . BMI 29.0-29.9,adult 12/21/2014  . GERD (gastroesophageal reflux disease)   . Hyperlipidemia with target LDL less than 100   . Hypertension   . Osteoporosis     Standley Brooking, PTA  07/11/2017, 10:44 AM  [facility] [place], [place], [address] Phone: [phone]   Fax:  [phone]  Name: Cheryl Lee MRN: [id number] Date of Birth: Sep 22, 1945

## 2017-07-16 ENCOUNTER — Ambulatory Visit: Payer: Medicare Other | Attending: Neurosurgery | Admitting: Physical Therapy

## 2017-07-16 ENCOUNTER — Encounter: Payer: Self-pay | Admitting: Physical Therapy

## 2017-07-16 DIAGNOSIS — R293 Abnormal posture: Secondary | ICD-10-CM | POA: Diagnosis not present

## 2017-07-16 DIAGNOSIS — M545 Low back pain: Secondary | ICD-10-CM | POA: Insufficient documentation

## 2017-07-16 DIAGNOSIS — M6281 Muscle weakness (generalized): Secondary | ICD-10-CM

## 2017-07-16 NOTE — Therapy (Signed)
[facility] [place], [place], [address] Phone: [phone]   Fax:  [phone]  Physical Therapy Treatment  Patient Details  Name: Cheryl Lee MRN: [id number] Date of Birth: 1945/10/11 Referring Provider: Dr. Kary Kos   Encounter Date: 07/16/2017  PT End of Session - 07/16/17 0910    Visit Number  11    Number of Visits  20    Date for PT Re-Evaluation  08/13/17    PT Start Time  0903    PT Stop Time  0950    PT Time Calculation (min)  47 min    Activity Tolerance  Patient tolerated treatment well    Behavior During Therapy  [facility] for tasks assessed/performed       Past Medical History:  Diagnosis Date  . Acid reflux   . Arthritis   . HNP (herniated nucleus pulposus), lumbar   . Hypercholesteremia   . Hypertension   . Osteoporosis   . Sinus congestion     Past Surgical History:  Procedure Laterality Date  . CATARACT EXTRACTION    . COLONOSCOPY    . LUMBAR LAMINECTOMY/DECOMPRESSION MICRODISCECTOMY Left 05/01/2017   Procedure: Microdiscectomy - Lumbar four-five  - left;  Surgeon: Kary Kos, MD;  Location: [place];  Service: Neurosurgery;  Laterality: Left;  . Right snkle repair      There were no vitals filed for this visit.  Subjective Assessment - 07/16/17 0909    Subjective  Reports that she isn't in any pain just the last two mornings she has woken with stiffness in low back.    Pertinent History  osteoporosis, R ankle fx from fall 2014    How long can you stand comfortably?  5 min (leg gives out)    How long can you walk comfortably?  2-3 min    Diagnostic tests  MRI showed L4/5 herniated disc    Patient Stated Goals  get rid of pain    Currently in Pain?  No/denies         [facility] PT Assessment - 07/16/17 0001      Assessment   Medical Diagnosis  closed compression fx L4 vertrebra    Onset Date/Surgical Date  05/01/17    Next MD Visit  08/13/2017      Precautions   Precautions  Back    Precaution Comments  no bending, lifting or twisting    Required Braces or Orthoses  Spinal Brace    Spinal Brace  Lumbar corset                  [facility] Adult PT Treatment/Exercise - 07/16/17 0001      Lumbar Exercises: Aerobic   Nustep  L4 x15 min      Lumbar Exercises: Standing   Other Standing Lumbar Exercises  B hip abduction x10 reps each    Other Standing Lumbar Exercises  B forward step ups 6" step x15 reps; rockerboard x3 min for calf stretch      Lumbar Exercises: Seated   Long Arc Quad on Chair  Strengthening;Both;20 reps;Weights;10 reps    LAQ on Chair Weights (lbs)  3      Lumbar Exercises: Supine   Bridge with clamshell  20 reps;2 seconds red theraband      Modalities   Modalities  Electrical Stimulation;Moist Heat      Moist Heat Therapy   Number Minutes Moist Heat  15 Minutes    Moist Heat Location  Lumbar Spine  Theme park manager  B low back    Electrical Stimulation Action  Pre-Mod    Electrical Stimulation Parameters  80-150 hz x15 min    Electrical Stimulation Goals  Pain               PT Short Term Goals - 07/02/17 0950      PT SHORT TERM GOAL #1   Title  I with initial HEP    Time  4    Period  Weeks    Status  Achieved      PT SHORT TERM GOAL #2   Title  Patient able to ambulate community distances safely with quad cane.    Time  4    Period  Weeks    Status  On-going FWW used as of 07/02/2017        PT Long Term Goals - 07/02/17 0950      PT LONG TERM GOAL #1   Title  I with advanced HEP including osteoporosis decompression exercises.    Time  8    Period  Weeks    Status  On-going      PT LONG TERM GOAL #2   Title  Patient able to be on her feet for ADLS for 30 min or more with pain 2/10 or less.    Time  8    Period  Weeks    Status  On-going      PT LONG TERM GOAL #3   Title  Patient able to ambulate community distances safely without AD.    Time  8    Period   Weeks    Status  On-going            Plan - 07/16/17 9563    Clinical Impression Statement  Patient tolerated today's treatment fairly well as she presented with no current pain. With standing exercises patient fatigued quickly especially with LLE. VCs and tactile cues provided throughout for erect stance although patient reported difficulty. Following seated exercises patient reported LLE discomfort relieved upon sitting. Normal modalities response noted following removal of the modalities.    Rehab Potential  Excellent    PT Frequency  2x / week    PT Duration  8 weeks    PT Treatment/Interventions  ADLs/Self Care Home Management;Cryotherapy;Electrical Stimulation;Moist Heat;Ultrasound;Therapeutic exercise;Neuromuscular re-education;Patient/family education    PT Next Visit Plan  s/p lumbar discectomy protocol; pain free strengthening for transverse abdominus/back/decompression for OP ; postural strengthening; gait training (wean off walker) modalities for pain prn    PT Home Exercise Plan  TA contraction, bridging     Consulted and Agree with Plan of Care  Patient       Patient will benefit from skilled therapeutic intervention in order to improve the following deficits and impairments:  Decreased range of motion, Pain, Decreased activity tolerance, Impaired flexibility, Postural dysfunction, Decreased strength  Visit Diagnosis: Acute midline low back pain, with sciatica presence unspecified  Muscle weakness (generalized)  Abnormal posture     Problem List Patient Active Problem List   Diagnosis Date Noted  . HNP (herniated nucleus pulposus), lumbar 05/01/2017  . BMI 29.0-29.9,adult 12/21/2014  . GERD (gastroesophageal reflux disease)   . Hyperlipidemia with target LDL less than 100   . Hypertension   . Osteoporosis     Standley Brooking, PTA 07/16/2017, 10:50 AM  [facility] [address] [place], [place], [address] Phone:  [phone]   Fax:  [phone]  Name: Cheryl Lee MRN: [id number] Date of Birth: 17-Feb-1946

## 2017-07-18 ENCOUNTER — Ambulatory Visit: Payer: Medicare Other | Admitting: Physical Therapy

## 2017-07-18 ENCOUNTER — Encounter: Payer: Self-pay | Admitting: Physical Therapy

## 2017-07-18 DIAGNOSIS — M6281 Muscle weakness (generalized): Secondary | ICD-10-CM

## 2017-07-18 DIAGNOSIS — M545 Low back pain: Secondary | ICD-10-CM

## 2017-07-18 DIAGNOSIS — R293 Abnormal posture: Secondary | ICD-10-CM | POA: Diagnosis not present

## 2017-07-18 NOTE — Therapy (Signed)
[facility] [place], [place], [address] Phone: [phone]   Fax:  [phone]  Physical Therapy Treatment  Patient Details  Name: Cheryl Lee MRN: [id number] Date of Birth: 1945/06/12 Referring Provider: Dr. Kary Kos   Encounter Date: 07/18/2017  PT End of Session - 07/18/17 0910    Visit Number  12    Number of Visits  20    Date for PT Re-Evaluation  08/13/17    PT Start Time  0903    PT Stop Time  0953    PT Time Calculation (min)  50 min    Activity Tolerance  Patient tolerated treatment well    Behavior During Therapy  [facility] for tasks assessed/performed       Past Medical History:  Diagnosis Date  . Acid reflux   . Arthritis   . HNP (herniated nucleus pulposus), lumbar   . Hypercholesteremia   . Hypertension   . Osteoporosis   . Sinus congestion     Past Surgical History:  Procedure Laterality Date  . CATARACT EXTRACTION    . COLONOSCOPY    . LUMBAR LAMINECTOMY/DECOMPRESSION MICRODISCECTOMY Left 05/01/2017   Procedure: Microdiscectomy - Lumbar four-five  - left;  Surgeon: Kary Kos, MD;  Location: [facility];  Service: Neurosurgery;  Laterality: Left;  . Right snkle repair      There were no vitals filed for this visit.  Subjective Assessment - 07/18/17 0909    Subjective  Reports difficulty in standing erect upon waking this morning but able to upon arriving at PT.    Pertinent History  osteoporosis, R ankle fx from fall 2014    How long can you stand comfortably?  5 min (leg gives out)    How long can you walk comfortably?  2-3 min    Diagnostic tests  MRI showed L4/5 herniated disc    Patient Stated Goals  get rid of pain    Currently in Pain?  Other (Comment) No pain assessment provided by patient         [facility] PT Assessment - 07/18/17 0001      Assessment   Medical Diagnosis  closed compression fx L4 vertrebra    Onset Date/Surgical Date  05/01/17    Next MD Visit  08/13/2017      Precautions   Precautions  Back    Precaution Comments  no bending, lifting or twisting    Required Braces or Orthoses  Spinal Brace    Spinal Brace  Lumbar corset                  [facility] Adult PT Treatment/Exercise - 07/18/17 0001      Lumbar Exercises: Aerobic   Nustep  L5 x15 min      Lumbar Exercises: Standing   Heel Raises  20 reps    Other Standing Lumbar Exercises  B hip abduction, march x15 reps each    Other Standing Lumbar Exercises  wall pushups x20 reps with tactile and VCs for proper form      Lumbar Exercises: Seated   Long Arc Quad on Chair  Strengthening;Both;20 reps;Weights;10 reps    LAQ on Chair Weights (lbs)  4    Sit to Stand  20 reps      Lumbar Exercises: Supine   Bridge  20 reps      Modalities   Modalities  Electrical Stimulation;Moist Heat      Moist Heat Therapy   Number Minutes Moist Heat  15 Minutes    Moist Heat Location  Lumbar Spine      Electrical Stimulation   Electrical Stimulation Location  B low back    Electrical Stimulation Action  Pre-Mod    Electrical Stimulation Parameters  80-150 hz x15 min    Electrical Stimulation Goals  Pain               PT Short Term Goals - 07/02/17 0950      PT SHORT TERM GOAL #1   Title  I with initial HEP    Time  4    Period  Weeks    Status  Achieved      PT SHORT TERM GOAL #2   Title  Patient able to ambulate community distances safely with quad cane.    Time  4    Period  Weeks    Status  On-going FWW used as of 07/02/2017        PT Long Term Goals - 07/02/17 0950      PT LONG TERM GOAL #1   Title  I with advanced HEP including osteoporosis decompression exercises.    Time  8    Period  Weeks    Status  On-going      PT LONG TERM GOAL #2   Title  Patient able to be on her feet for ADLS for 30 min or more with pain 2/10 or less.    Time  8    Period  Weeks    Status  On-going      PT LONG TERM GOAL #3   Title  Patient able to ambulate community distances  safely without AD.    Time  8    Period  Weeks    Status  On-going            Plan - 07/18/17 0943    Clinical Impression Statement  Patient tolerated today's treatment well although greatest complaint being LLE fatigue. Patient guided through lower level standing exercises secondary to discomfort and fatigue noted in low back and LLE in previous treatment. More VCs and demo provided for erect stance with for standing exercises. Patient experienced less LLE fatigue today during exercises per patient report. Patient continues to be compliant with lumbar corset and FWW use as well as log rolling technique. Normal modalities response noted following removal of the modalities.    Rehab Potential  Excellent    PT Frequency  2x / week    PT Duration  8 weeks    PT Treatment/Interventions  ADLs/Self Care Home Management;Cryotherapy;Electrical Stimulation;Moist Heat;Ultrasound;Therapeutic exercise;Neuromuscular re-education;Patient/family education    PT Next Visit Plan  s/p lumbar discectomy protocol; pain free strengthening for transverse abdominus/back/decompression for OP ; postural strengthening; gait training (wean off walker) modalities for pain prn    PT Home Exercise Plan  TA contraction, bridging     Consulted and Agree with Plan of Care  Patient       Patient will benefit from skilled therapeutic intervention in order to improve the following deficits and impairments:  Decreased range of motion, Pain, Decreased activity tolerance, Impaired flexibility, Postural dysfunction, Decreased strength  Visit Diagnosis: Acute midline low back pain, with sciatica presence unspecified  Muscle weakness (generalized)  Abnormal posture     Problem List Patient Active Problem List   Diagnosis Date Noted  . HNP (herniated nucleus pulposus), lumbar 05/01/2017  . BMI 29.0-29.9,adult 12/21/2014  . GERD (gastroesophageal reflux disease)   . Hyperlipidemia with target LDL  less than 100   .  Hypertension   . Osteoporosis     Standley Brooking, PTA 07/18/2017, 10:06 AM  [facility] [address] [place], [place], [address] Phone: [phone]   Fax:  [phone]  Name: Cheryl Lee MRN: [id number] Date of Birth: 22-Feb-1946

## 2017-07-23 ENCOUNTER — Ambulatory Visit: Payer: Medicare Other | Admitting: Physical Therapy

## 2017-07-23 ENCOUNTER — Encounter: Payer: Self-pay | Admitting: Physical Therapy

## 2017-07-23 DIAGNOSIS — M6281 Muscle weakness (generalized): Secondary | ICD-10-CM

## 2017-07-23 DIAGNOSIS — R293 Abnormal posture: Secondary | ICD-10-CM

## 2017-07-23 DIAGNOSIS — M545 Low back pain: Secondary | ICD-10-CM

## 2017-07-23 NOTE — Therapy (Signed)
[facility] [place], [place], [address] Phone: [phone]   Fax:  [phone]  Physical Therapy Treatment  Patient Details  Name: Cheryl Lee MRN: [id number] Date of Birth: 1945-09-28 Referring Provider: Dr. Kary Kos   Encounter Date: 07/23/2017  PT End of Session - 07/23/17 0913    Visit Number  13    Number of Visits  20    Date for PT Re-Evaluation  08/13/17    PT Start Time  0901    PT Stop Time  0950    PT Time Calculation (min)  49 min    Activity Tolerance  Patient tolerated treatment well    Behavior During Therapy  [facility] for tasks assessed/performed       Past Medical History:  Diagnosis Date  . Acid reflux   . Arthritis   . HNP (herniated nucleus pulposus), lumbar   . Hypercholesteremia   . Hypertension   . Osteoporosis   . Sinus congestion     Past Surgical History:  Procedure Laterality Date  . CATARACT EXTRACTION    . COLONOSCOPY    . LUMBAR LAMINECTOMY/DECOMPRESSION MICRODISCECTOMY Left 05/01/2017   Procedure: Microdiscectomy - Lumbar four-five  - left;  Surgeon: Kary Kos, MD;  Location: [place];  Service: Neurosurgery;  Laterality: Left;  . Right snkle repair      There were no vitals filed for this visit.  Subjective Assessment - 07/23/17 0913    Subjective  Reports that mornings are usually the worse as well as prolonged standing.    Pertinent History  osteoporosis, R ankle fx from fall 2014    How long can you stand comfortably?  5 min (leg gives out)    How long can you walk comfortably?  2-3 min    Diagnostic tests  MRI showed L4/5 herniated disc    Patient Stated Goals  get rid of pain    Currently in Pain?  Yes    Pain Score  2     Pain Location  Back    Pain Orientation  Left;Lower    Pain Descriptors / Indicators  Discomfort    Pain Type  Acute pain    Pain Onset  More than a month ago    Pain Frequency  Intermittent         [facility] PT Assessment - 07/23/17 0001       Assessment   Medical Diagnosis  closed compression fx L4 vertrebra    Onset Date/Surgical Date  05/01/17    Next MD Visit  08/13/2017      Precautions   Precautions  Back    Precaution Comments  no bending, lifting or twisting    Required Braces or Orthoses  Spinal Brace    Spinal Brace  Lumbar corset                  [facility] Adult PT Treatment/Exercise - 07/23/17 0001      Exercises   Exercises  Lumbar;Knee/Hip      Lumbar Exercises: Aerobic   Nustep  L5 x18 min      Lumbar Exercises: Standing   Other Standing Lumbar Exercises  wall pushups x20 reps with tactile and VCs for proper form      Lumbar Exercises: Seated   Long Arc Quad on Chair  Strengthening;Both;20 reps;Weights;10 reps    LAQ on Chair Weights (lbs)  4      Lumbar Exercises: Supine   Bridge  20 reps  Knee/Hip Exercises: Standing   Hip Flexion  AROM;Both;20 reps;Knee bent    Hip Abduction  AROM;Both;20 reps;Knee straight    Rocker Board  2 minutes      Modalities   Modalities  Electrical Stimulation;Moist Heat      Moist Heat Therapy   Number Minutes Moist Heat  15 Minutes    Moist Heat Location  Lumbar Spine      Electrical Stimulation   Electrical Stimulation Location  B low back    Electrical Stimulation Action  Pre-Mod    Electrical Stimulation Parameters  80-150 hz x15 min    Electrical Stimulation Goals  Pain               PT Short Term Goals - 07/02/17 0950      PT SHORT TERM GOAL #1   Title  I with initial HEP    Time  4    Period  Weeks    Status  Achieved      PT SHORT TERM GOAL #2   Title  Patient able to ambulate community distances safely with quad cane.    Time  4    Period  Weeks    Status  On-going FWW used as of 07/02/2017        PT Long Term Goals - 07/02/17 0950      PT LONG TERM GOAL #1   Title  I with advanced HEP including osteoporosis decompression exercises.    Time  8    Period  Weeks    Status  On-going      PT LONG TERM GOAL #2    Title  Patient able to be on her feet for ADLS for 30 min or more with pain 2/10 or less.    Time  8    Period  Weeks    Status  On-going      PT LONG TERM GOAL #3   Title  Patient able to ambulate community distances safely without AD.    Time  8    Period  Weeks    Status  On-going            Plan - 07/23/17 9147    Clinical Impression Statement  Patient tolerated today's treatment well with continued complaint of discomfort in the mornings. Guided through more standing exercises with VCs for erect stance and core activation. VCs also provided with sitting exercises as well. Patient also cued to complete reps slowly and with proper technique as well. Normal modalities response noted following removal of the modalities.    Rehab Potential  Excellent    PT Frequency  2x / week    PT Duration  8 weeks    PT Treatment/Interventions  ADLs/Self Care Home Management;Cryotherapy;Electrical Stimulation;Moist Heat;Ultrasound;Therapeutic exercise;Neuromuscular re-education;Patient/family education    PT Next Visit Plan  s/p lumbar discectomy protocol; pain free strengthening for transverse abdominus/back/decompression for OP ; postural strengthening; gait training (wean off walker) modalities for pain prn    PT Home Exercise Plan  TA contraction, bridging     Consulted and Agree with Plan of Care  Patient       Patient will benefit from skilled therapeutic intervention in order to improve the following deficits and impairments:  Decreased range of motion, Pain, Decreased activity tolerance, Impaired flexibility, Postural dysfunction, Decreased strength  Visit Diagnosis: Acute midline low back pain, with sciatica presence unspecified  Muscle weakness (generalized)  Abnormal posture     Problem List Patient Active Problem List  Diagnosis Date Noted  . HNP (herniated nucleus pulposus), lumbar 05/01/2017  . BMI 29.0-29.9,adult 12/21/2014  . GERD (gastroesophageal reflux disease)    . Hyperlipidemia with target LDL less than 100   . Hypertension   . Osteoporosis     Standley Brooking, PTA 07/23/2017, 9:54 AM  [facility] [address] [place], [place], [address] Phone: [phone]   Fax:  [phone]  Name: Cheryl Lee MRN: [id number] Date of Birth: 02/09/1946

## 2017-07-25 ENCOUNTER — Encounter: Payer: Self-pay | Admitting: Physical Therapy

## 2017-07-25 ENCOUNTER — Ambulatory Visit: Payer: Medicare Other | Admitting: Physical Therapy

## 2017-07-25 DIAGNOSIS — M545 Low back pain: Secondary | ICD-10-CM | POA: Diagnosis not present

## 2017-07-25 DIAGNOSIS — R293 Abnormal posture: Secondary | ICD-10-CM | POA: Diagnosis not present

## 2017-07-25 DIAGNOSIS — M6281 Muscle weakness (generalized): Secondary | ICD-10-CM | POA: Diagnosis not present

## 2017-07-25 NOTE — Therapy (Signed)
[facility] [place], [place], [address] Phone: [phone]   Fax:  [phone]  Physical Therapy Treatment  Patient Details  Name: Cheryl Lee MRN: [id number] Date of Birth: [birth year] Referring Provider: Dr. Kary Kos   Encounter Date: 07/25/2017  PT End of Session - 07/25/17 0853    Visit Number  14    Number of Visits  20    Date for PT Re-Evaluation  08/13/17    PT Start Time  0901    PT Stop Time  0949    PT Time Calculation (min)  48 min    Activity Tolerance  Patient tolerated treatment well;Patient limited by fatigue    Behavior During Therapy  [facility] for tasks assessed/performed       Past Medical History:  Diagnosis Date  . Acid reflux   . Arthritis   . HNP (herniated nucleus pulposus), lumbar   . Hypercholesteremia   . Hypertension   . Osteoporosis   . Sinus congestion     Past Surgical History:  Procedure Laterality Date  . CATARACT EXTRACTION    . COLONOSCOPY    . LUMBAR LAMINECTOMY/DECOMPRESSION MICRODISCECTOMY Left 05/01/2017   Procedure: Microdiscectomy - Lumbar four-five  - left;  Surgeon: Kary Kos, MD;  Location: [facility];  Service: Neurosurgery;  Laterality: Left;  . Right snkle repair      There were no vitals filed for this visit.  Subjective Assessment - 07/25/17 0908    Subjective  Reports that she has one area of L low back discomfort and only intermittant LE pain.    Pertinent History  osteoporosis, R ankle fx from fall 2014    How long can you stand comfortably?  5 min (leg gives out)    How long can you walk comfortably?  2-3 min    Diagnostic tests  MRI showed L4/5 herniated disc    Patient Stated Goals  get rid of pain    Currently in Pain?  Yes    Pain Score  2     Pain Location  Back    Pain Orientation  Left;Lower    Pain Descriptors / Indicators  Tender;Sore    Pain Type  Acute pain    Pain Onset  More than a month ago    Pain Frequency  Intermittent          [facility] PT Assessment - 07/25/17 0001      Assessment   Medical Diagnosis  closed compression fx L4 vertrebra    Onset Date/Surgical Date  05/01/17    Next MD Visit  08/13/2017      Precautions   Precautions  Back    Precaution Comments  no bending, lifting or twisting    Required Braces or Orthoses  Spinal Brace    Spinal Brace  Lumbar corset                  [facility] Adult PT Treatment/Exercise - 07/25/17 0001      Lumbar Exercises: Aerobic   Nustep  L5 x17 min      Lumbar Exercises: Standing   Other Standing Lumbar Exercises  B forward step ups x10 reps each with VCs for     Other Standing Lumbar Exercises  wall pushups x20 reps with tactile and VCs for proper form      Lumbar Exercises: Supine   Bridge with Ball Squeeze  20 reps    Straight Leg Raise  10 reps Reported weakness of  BLE      Modalities   Modalities  Electrical Stimulation;Moist Heat      Moist Heat Therapy   Number Minutes Moist Heat  15 Minutes    Moist Heat Location  Lumbar Spine      Electrical Stimulation   Electrical Stimulation Location  B low back    Electrical Stimulation Action  Pre-Mod    Electrical Stimulation Parameters  80-150 hz x15 min    Electrical Stimulation Goals  Pain               PT Short Term Goals - 07/02/17 0950      PT SHORT TERM GOAL #1   Title  I with initial HEP    Time  4    Period  Weeks    Status  Achieved      PT SHORT TERM GOAL #2   Title  Patient able to ambulate community distances safely with quad cane.    Time  4    Period  Weeks    Status  On-going FWW used as of 07/02/2017        PT Long Term Goals - 07/02/17 0950      PT LONG TERM GOAL #1   Title  I with advanced HEP including osteoporosis decompression exercises.    Time  8    Period  Weeks    Status  On-going      PT LONG TERM GOAL #2   Title  Patient able to be on her feet for ADLS for 30 min or more with pain 2/10 or less.    Time  8    Period  Weeks    Status   On-going      PT LONG TERM GOAL #3   Title  Patient able to ambulate community distances safely without AD.    Time  8    Period  Weeks    Status  On-going            Plan - 07/25/17 0941    Clinical Impression Statement  Patient tolerated today's treatment fairly well as she arrived with reports of tenderness and soreness upon arrival in L low back. Patient provided cueing for erect stance with core activation throughout treatment for standing exercises as well as supine exercises. As treatment progressed with forward step ups patient began to fatigue with her LEs and core. Patient especially fatigued with SLRs. and was VC'd for breathing along with core activation. Normal modalities response noted following removal of the modalities.    Rehab Potential  Excellent    PT Frequency  2x / week    PT Duration  8 weeks    PT Treatment/Interventions  ADLs/Self Care Home Management;Cryotherapy;Electrical Stimulation;Moist Heat;Ultrasound;Therapeutic exercise;Neuromuscular re-education;Patient/family education    PT Next Visit Plan  s/p lumbar discectomy protocol; pain free strengthening for transverse abdominus/back/decompression for OP ; postural strengthening; gait training (wean off walker) modalities for pain prn    PT Home Exercise Plan  TA contraction, bridging     Consulted and Agree with Plan of Care  Patient       Patient will benefit from skilled therapeutic intervention in order to improve the following deficits and impairments:  Decreased range of motion, Pain, Decreased activity tolerance, Impaired flexibility, Postural dysfunction, Decreased strength  Visit Diagnosis: Acute midline low back pain, with sciatica presence unspecified  Muscle weakness (generalized)  Abnormal posture     Problem List Patient Active Problem List   Diagnosis Date  Noted  . HNP (herniated nucleus pulposus), lumbar 05/01/2017  . BMI 29.0-29.9,adult 12/21/2014  . GERD (gastroesophageal reflux  disease)   . Hyperlipidemia with target LDL less than 100   . Hypertension   . Osteoporosis     Standley Brooking, PTA 07/25/2017, 9:50 AM  [facility] [address] [place], [place], [address] Phone: [phone]   Fax:  [phone]  Name: Cheryl Lee MRN: [id number] Date of Birth: [birth year]

## 2017-07-30 ENCOUNTER — Encounter: Payer: Self-pay | Admitting: Physical Therapy

## 2017-07-30 ENCOUNTER — Ambulatory Visit: Payer: Medicare Other | Admitting: Physical Therapy

## 2017-07-30 DIAGNOSIS — M545 Low back pain: Secondary | ICD-10-CM | POA: Diagnosis not present

## 2017-07-30 DIAGNOSIS — R293 Abnormal posture: Secondary | ICD-10-CM | POA: Diagnosis not present

## 2017-07-30 DIAGNOSIS — M6281 Muscle weakness (generalized): Secondary | ICD-10-CM | POA: Diagnosis not present

## 2017-07-30 NOTE — Therapy (Signed)
[facility] [place], [place], [address] Phone: [phone]   Fax:  [phone]  Physical Therapy Treatment  Patient Details  Name: Cheryl Lee MRN: [id number] Date of Birth: May 11, 1946 Referring Provider: Dr. Kary Kos   Encounter Date: 07/30/2017  PT End of Session - 07/30/17 0903    Visit Number  15    Number of Visits  20    Date for PT Re-Evaluation  08/13/17    PT Start Time  0901    PT Stop Time  0948    PT Time Calculation (min)  47 min    Activity Tolerance  Patient tolerated treatment well    Behavior During Therapy  [facility] for tasks assessed/performed       Past Medical History:  Diagnosis Date  . Acid reflux   . Arthritis   . HNP (herniated nucleus pulposus), lumbar   . Hypercholesteremia   . Hypertension   . Osteoporosis   . Sinus congestion     Past Surgical History:  Procedure Laterality Date  . CATARACT EXTRACTION    . COLONOSCOPY    . LUMBAR LAMINECTOMY/DECOMPRESSION MICRODISCECTOMY Left 05/01/2017   Procedure: Microdiscectomy - Lumbar four-five  - left;  Surgeon: Kary Kos, MD;  Location: [place];  Service: Neurosurgery;  Laterality: Left;  . Right snkle repair      There were no vitals filed for this visit.  Subjective Assessment - 07/30/17 0902    Subjective  Reports that she has been having cramping sensation in R foot as well as R ankle swelling. Reports that this morning upon waking she has L foot cramping sensation.    Pertinent History  osteoporosis, R ankle fx from fall 2014    How long can you stand comfortably?  5 min (leg gives out)    How long can you walk comfortably?  2-3 min    Diagnostic tests  MRI showed L4/5 herniated disc    Patient Stated Goals  get rid of pain    Currently in Pain?  Yes    Pain Score  -- No pain reported only tenderness    Pain Location  Back    Pain Orientation  Left;Lower    Pain Descriptors / Indicators  Tender    Pain Type  Acute pain    Pain  Onset  More than a month ago         [facility] PT Assessment - 07/30/17 0001      Assessment   Medical Diagnosis  closed compression fx L4 vertrebra    Onset Date/Surgical Date  05/01/17    Next MD Visit  08/13/2017      Precautions   Precautions  Back    Precaution Comments  no bending, lifting or twisting    Required Braces or Orthoses  Spinal Brace    Spinal Brace  Lumbar corset                  [facility] Adult PT Treatment/Exercise - 07/30/17 0001      Lumbar Exercises: Aerobic   Stationary Bike  L2 x3 min    Nustep  L5 x17 min      Lumbar Exercises: Standing   Other Standing Lumbar Exercises  B hip flex, abduction x10 reps each in parallel bars    Other Standing Lumbar Exercises  Rockerboard for calf stretch x3 min      Lumbar Exercises: Seated   Long Arc Quad on Chair  Strengthening;Both;Weights;3 sets;10 reps  LAQ on Chair Weights (lbs)  4      Modalities   Modalities  Electrical Stimulation;Moist Heat      Moist Heat Therapy   Number Minutes Moist Heat  15 Minutes    Moist Heat Location  Lumbar Spine      Electrical Stimulation   Electrical Stimulation Location  B low back    Electrical Stimulation Action  Pre-Mod    Electrical Stimulation Parameters  80-150 hz x15 min    Electrical Stimulation Goals  Pain               PT Short Term Goals - 07/02/17 0950      PT SHORT TERM GOAL #1   Title  I with initial HEP    Time  4    Period  Weeks    Status  Achieved      PT SHORT TERM GOAL #2   Title  Patient able to ambulate community distances safely with quad cane.    Time  4    Period  Weeks    Status  On-going FWW used as of 07/02/2017        PT Long Term Goals - 07/02/17 0950      PT LONG TERM GOAL #1   Title  I with advanced HEP including osteoporosis decompression exercises.    Time  8    Period  Weeks    Status  On-going      PT LONG TERM GOAL #2   Title  Patient able to be on her feet for ADLS for 30 min or more with pain  2/10 or less.    Time  8    Period  Weeks    Status  On-going      PT LONG TERM GOAL #3   Title  Patient able to ambulate community distances safely without AD.    Time  8    Period  Weeks    Status  On-going            Plan - 07/30/17 0953    Clinical Impression Statement  Patient tolerated today's treatment fair as she fatigued very quickly with stationary bike and complained of cramping sensation in B feet today. Patient limited with reps today secondary to the BLE fatigue sensation. VCs for erect posture required throughout treatment. No increased pain reported during treatment. Normal modalities response noted following removal of the modalities.     Rehab Potential  Excellent    PT Frequency  2x / week    PT Duration  8 weeks    PT Treatment/Interventions  ADLs/Self Care Home Management;Cryotherapy;Electrical Stimulation;Moist Heat;Ultrasound;Therapeutic exercise;Neuromuscular re-education;Patient/family education    PT Next Visit Plan  s/p lumbar discectomy protocol; pain free strengthening for transverse abdominus/back/decompression for OP ; postural strengthening; gait training (wean off walker) modalities for pain prn    PT Home Exercise Plan  TA contraction, bridging     Consulted and Agree with Plan of Care  Patient       Patient will benefit from skilled therapeutic intervention in order to improve the following deficits and impairments:  Decreased range of motion, Pain, Decreased activity tolerance, Impaired flexibility, Postural dysfunction, Decreased strength  Visit Diagnosis: Acute midline low back pain, with sciatica presence unspecified  Muscle weakness (generalized)  Abnormal posture     Problem List Patient Active Problem List   Diagnosis Date Noted  . HNP (herniated nucleus pulposus), lumbar 05/01/2017  . BMI 29.0-29.9,adult 12/21/2014  . GERD (  gastroesophageal reflux disease)   . Hyperlipidemia with target LDL less than 100   . Hypertension   .  Osteoporosis     Standley Brooking, PTA 07/30/2017, 10:02 AM  [facility] [address] [place], [place], [address] Phone: [phone]   Fax:  [phone]  Name: Cheryl Lee MRN: [id number] Date of Birth: 05-Aug-1945

## 2017-08-01 ENCOUNTER — Encounter: Payer: Self-pay | Admitting: Physical Therapy

## 2017-08-01 ENCOUNTER — Ambulatory Visit: Payer: Medicare Other | Admitting: Physical Therapy

## 2017-08-01 DIAGNOSIS — M6281 Muscle weakness (generalized): Secondary | ICD-10-CM | POA: Diagnosis not present

## 2017-08-01 DIAGNOSIS — M545 Low back pain: Secondary | ICD-10-CM | POA: Diagnosis not present

## 2017-08-01 DIAGNOSIS — R293 Abnormal posture: Secondary | ICD-10-CM | POA: Diagnosis not present

## 2017-08-01 NOTE — Therapy (Signed)
[facility] [place], [place], [address] Phone: [phone]   Fax:  [phone]  Physical Therapy Treatment  Patient Details  Name: Cheryl Lee MRN: [id number] Date of Birth: [birth year] Referring Provider: Dr. Kary Kos   Encounter Date: 08/01/2017  PT End of Session - 08/01/17 1148    Visit Number  16    Number of Visits  20    Date for PT Re-Evaluation  08/13/17    PT Start Time  1030    PT Stop Time  1126    PT Time Calculation (min)  56 min    Activity Tolerance  Patient tolerated treatment well    Behavior During Therapy  [facility] for tasks assessed/performed       Past Medical History:  Diagnosis Date  . Acid reflux   . Arthritis   . HNP (herniated nucleus pulposus), lumbar   . Hypercholesteremia   . Hypertension   . Osteoporosis   . Sinus congestion     Past Surgical History:  Procedure Laterality Date  . CATARACT EXTRACTION    . COLONOSCOPY    . LUMBAR LAMINECTOMY/DECOMPRESSION MICRODISCECTOMY Left 05/01/2017   Procedure: Microdiscectomy - Lumbar four-five  - left;  Surgeon: Kary Kos, MD;  Location: [place];  Service: Neurosurgery;  Laterality: Left;  . Right snkle repair      There were no vitals filed for this visit.  Subjective Assessment - 08/01/17 1139    Subjective  I'm doing better.    Currently in Pain?  Yes    Pain Score  2     Pain Location  Back    Pain Orientation  Left;Lower    Pain Descriptors / Indicators  Tender    Pain Type  Acute pain    Pain Onset  More than a month ago                      [facility] Adult PT Treatment/Exercise - 08/01/17 0001      Exercises   Exercises  Knee/Hip      Lumbar Exercises: Aerobic   Nustep  Level 4 x 20 minutes.      Lumbar Exercises: Standing   Other Standing Lumbar Exercises  Rockerboard for calf stretching and core activation x 3 minutes f/b 6 inch step-ups x 3 minutes.      Knee/Hip Exercises: Supine   Other Supine  Knee/Hip Exercises  Hip bridges to fatigue x 2.      Modalities   Modalities  Electrical Stimulation;Moist Heat      Moist Heat Therapy   Number Minutes Moist Heat  20 Minutes    Moist Heat Location  Lumbar Spine      Electrical Stimulation   Electrical Stimulation Location  Bilateral low back.    Electrical Stimulation Action  IFC    Electrical Stimulation Parameters  80-150 Hz x 20 minutes on 100% scan.               PT Short Term Goals - 07/02/17 0950      PT SHORT TERM GOAL #1   Title  I with initial HEP    Time  4    Period  Weeks    Status  Achieved      PT SHORT TERM GOAL #2   Title  Patient able to ambulate community distances safely with quad cane.    Time  4    Period  Weeks    Status  On-going FWW used as of 07/02/2017        PT Long Term Goals - 07/02/17 0950      PT LONG TERM GOAL #1   Title  I with advanced HEP including osteoporosis decompression exercises.    Time  8    Period  Weeks    Status  On-going      PT LONG TERM GOAL #2   Title  Patient able to be on her feet for ADLS for 30 min or more with pain 2/10 or less.    Time  8    Period  Weeks    Status  On-going      PT LONG TERM GOAL #3   Title  Patient able to ambulate community distances safely without AD.    Time  8    Period  Weeks    Status  On-going            Plan - 08/01/17 1144    Clinical Impression Statement  The patient did an excellent job today and was highly motivated throughout her treatment.  She is pleased with her progress thus far.    PT Treatment/Interventions  ADLs/Self Care Home Management;Cryotherapy;Electrical Stimulation;Moist Heat;Ultrasound;Therapeutic exercise;Neuromuscular re-education;Patient/family education    PT Next Visit Plan  s/p lumbar discectomy protocol; pain free strengthening for transverse abdominus/back/decompression for OP ; postural strengthening; gait training (wean off walker) modalities for pain prn    PT Home Exercise Plan  TA  contraction, bridging     Consulted and Agree with Plan of Care  Patient       Patient will benefit from skilled therapeutic intervention in order to improve the following deficits and impairments:  Decreased range of motion, Pain, Decreased activity tolerance, Impaired flexibility, Postural dysfunction, Decreased strength  Visit Diagnosis: Acute midline low back pain, with sciatica presence unspecified     Problem List Patient Active Problem List   Diagnosis Date Noted  . HNP (herniated nucleus pulposus), lumbar 05/01/2017  . BMI 29.0-29.9,adult 12/21/2014  . GERD (gastroesophageal reflux disease)   . Hyperlipidemia with target LDL less than 100   . Hypertension   . Osteoporosis     Kollyn Lingafelter, [place] MPT 08/01/2017, 11:49 AM  [facility] [address] [place], [place], [address] Phone: [phone]   Fax:  [phone]  Name: Cheryl Lee MRN: [id number] Date of Birth: 08-20-1945

## 2017-08-06 ENCOUNTER — Encounter: Payer: Self-pay | Admitting: Physical Therapy

## 2017-08-06 ENCOUNTER — Ambulatory Visit: Payer: Medicare Other | Admitting: Physical Therapy

## 2017-08-06 DIAGNOSIS — R293 Abnormal posture: Secondary | ICD-10-CM | POA: Diagnosis not present

## 2017-08-06 DIAGNOSIS — M545 Low back pain: Secondary | ICD-10-CM

## 2017-08-06 DIAGNOSIS — M6281 Muscle weakness (generalized): Secondary | ICD-10-CM | POA: Diagnosis not present

## 2017-08-06 NOTE — Therapy (Signed)
[facility] [place], [place], [address] Phone: [phone]   Fax:  [phone]  Physical Therapy Treatment  Patient Details  Name: Cheryl Lee MRN: [id number] Date of Birth: 20-Nov-1945 Referring Provider: Dr. Kary Kos   Encounter Date: 08/06/2017  PT End of Session - 08/06/17 1020    Visit Number  17    Number of Visits  20    Date for PT Re-Evaluation  08/13/17    PT Start Time  0900    PT Stop Time  0958    PT Time Calculation (min)  58 min    Activity Tolerance  Patient tolerated treatment well    Behavior During Therapy  [facility] for tasks assessed/performed       Past Medical History:  Diagnosis Date  . Acid reflux   . Arthritis   . HNP (herniated nucleus pulposus), lumbar   . Hypercholesteremia   . Hypertension   . Osteoporosis   . Sinus congestion     Past Surgical History:  Procedure Laterality Date  . CATARACT EXTRACTION    . COLONOSCOPY    . LUMBAR LAMINECTOMY/DECOMPRESSION MICRODISCECTOMY Left 05/01/2017   Procedure: Microdiscectomy - Lumbar four-five  - left;  Surgeon: Kary Kos, MD;  Location: [place];  Service: Neurosurgery;  Laterality: Left;  . Right snkle repair      There were no vitals filed for this visit.  Subjective Assessment - 08/06/17 0939    Subjective  Today is a good day.  Pain about a 1.    Pain Score  1     Pain Location  Back    Pain Orientation  Left;Lower    Pain Type  Acute pain    Pain Onset  More than a month ago                No data recorded       [facility] Adult PT Treatment/Exercise - 08/06/17 0001      Exercises   Exercises  Knee/Hip      Lumbar Exercises: Aerobic   Nustep  Level 4 monitored for increased cadenece.x 20 minutes.      Lumbar Exercises: Supine   Other Supine Lumbar Exercises  Hip bridges to fatigue x 3 and mini-crunches to fatigue x 2.      Modalities   Modalities  Electrical Stimulation;Moist Heat      Moist Heat Therapy   Number Minutes Moist Heat  20 Minutes    Moist Heat Location  Lumbar Spine      Electrical Stimulation   Electrical Stimulation Location  Bilateral low back.    Electrical Stimulation Action  IFC    Electrical Stimulation Parameters  80-150 Hz x 20 minutes    Electrical Stimulation Goals  Pain               PT Short Term Goals - 07/02/17 0950      PT SHORT TERM GOAL #1   Title  I with initial HEP    Time  4    Period  Weeks    Status  Achieved      PT SHORT TERM GOAL #2   Title  Patient able to ambulate community distances safely with quad cane.    Time  4    Period  Weeks    Status  On-going FWW used as of 07/02/2017        PT Long Term Goals - 07/02/17 [phone]  PT LONG TERM GOAL #1   Title  I with advanced HEP including osteoporosis decompression exercises.    Time  8    Period  Weeks    Status  On-going      PT LONG TERM GOAL #2   Title  Patient able to be on her feet for ADLS for 30 min or more with pain 2/10 or less.    Time  8    Period  Weeks    Status  On-going      PT LONG TERM GOAL #3   Title  Patient able to ambulate community distances safely without AD.    Time  8    Period  Weeks    Status  On-going            Plan - 08/06/17 1026    Clinical Impression Statement  The patient did an excellent job today today.  She increased her cadenec on the Nustep today and performed supine exercises very well today.  Noted improvment in overal mobility today.    PT Treatment/Interventions  ADLs/Self Care Home Management;Cryotherapy;Electrical Stimulation;Moist Heat;Ultrasound;Therapeutic exercise;Neuromuscular re-education;Patient/family education    PT Next Visit Plan  s/p lumbar discectomy protocol; pain free strengthening for transverse abdominus/back/decompression for OP ; postural strengthening; gait training (wean off walker) modalities for pain prn    PT Home Exercise Plan  TA contraction, bridging     Consulted and Agree with Plan of Care   Patient       Patient will benefit from skilled therapeutic intervention in order to improve the following deficits and impairments:  Decreased range of motion, Pain, Decreased activity tolerance, Impaired flexibility, Postural dysfunction, Decreased strength  Visit Diagnosis: Acute midline low back pain, with sciatica presence unspecified  Muscle weakness (generalized)  Abnormal posture     Problem List Patient Active Problem List   Diagnosis Date Noted  . HNP (herniated nucleus pulposus), lumbar 05/01/2017  . BMI 29.0-29.9,adult 12/21/2014  . GERD (gastroesophageal reflux disease)   . Hyperlipidemia with target LDL less than 100   . Hypertension   . Osteoporosis     Patricie Geeslin, [place] MPT 08/06/2017, 10:42 AM  [facility] [address] [place], [place], [address] Phone: [phone]   Fax:  [phone]  Name: DELILAH MULGREW MRN: [id number] Date of Birth: 06-08-1945

## 2017-08-08 ENCOUNTER — Encounter: Payer: Self-pay | Admitting: Physical Therapy

## 2017-08-08 ENCOUNTER — Ambulatory Visit: Payer: Medicare Other | Admitting: Physical Therapy

## 2017-08-08 DIAGNOSIS — M6281 Muscle weakness (generalized): Secondary | ICD-10-CM | POA: Diagnosis not present

## 2017-08-08 DIAGNOSIS — M545 Low back pain: Secondary | ICD-10-CM | POA: Diagnosis not present

## 2017-08-08 DIAGNOSIS — R293 Abnormal posture: Secondary | ICD-10-CM

## 2017-08-08 NOTE — Therapy (Signed)
[facility] [place], [place], [address] Phone: [phone]   Fax:  [phone]  Physical Therapy Treatment  Patient Details  Name: Cheryl Lee MRN: [id number] Date of Birth: [birth year] Referring Provider: Dr. Kary Kos   Encounter Date: 08/08/2017  PT End of Session - 08/08/17 0934    Visit Number  18    Number of Visits  20    Date for PT Re-Evaluation  08/13/17    PT Start Time  0853    PT Stop Time  0951    PT Time Calculation (min)  58 min    Activity Tolerance  Patient tolerated treatment well    Behavior During Therapy  [facility] for tasks assessed/performed       Past Medical History:  Diagnosis Date  . Acid reflux   . Arthritis   . HNP (herniated nucleus pulposus), lumbar   . Hypercholesteremia   . Hypertension   . Osteoporosis   . Sinus congestion     Past Surgical History:  Procedure Laterality Date  . CATARACT EXTRACTION    . COLONOSCOPY    . LUMBAR LAMINECTOMY/DECOMPRESSION MICRODISCECTOMY Left 05/01/2017   Procedure: Microdiscectomy - Lumbar four-five  - left;  Surgeon: Kary Kos, MD;  Location: [place];  Service: Neurosurgery;  Laterality: Left;  . Right snkle repair      There were no vitals filed for this visit.  Subjective Assessment - 08/08/17 0934    Subjective  Doing good but my left leg aches.    Currently in Pain?  Yes    Pain Score  3     Pain Location  Leg    Pain Orientation  Left    Pain Descriptors / Indicators  Aching    Pain Onset  More than a month ago                No data recorded       [facility] Adult PT Treatment/Exercise - 08/08/17 0001      Exercises   Exercises  Knee/Hip      Lumbar Exercises: Aerobic   Nustep  Level 5 x 20 minutes.      Knee/Hip Exercises: Seated   Other Seated Knee/Hip Exercises  Combined movement of left ankle dorsiflexion and eversion while performing LAQ's with 2# ankle isolator attached to foot x 4 minutes.      Modalities    Modalities  Electrical Stimulation;Moist Heat      Moist Heat Therapy   Number Minutes Moist Heat  20 Minutes    Moist Heat Location  Lumbar Spine      Electrical Stimulation   Electrical Stimulation Location  Left low back.    Electrical Stimulation Action  Pre-mod.    Electrical Stimulation Parameters  80-150 Hz x 20 minutes    Electrical Stimulation Goals  Tone;Pain               PT Short Term Goals - 07/02/17 0950      PT SHORT TERM GOAL #1   Title  I with initial HEP    Time  4    Period  Weeks    Status  Achieved      PT SHORT TERM GOAL #2   Title  Patient able to ambulate community distances safely with quad cane.    Time  4    Period  Weeks    Status  On-going FWW used as of 07/02/2017  PT Long Term Goals - 07/02/17 0950      PT LONG TERM GOAL #1   Title  I with advanced HEP including osteoporosis decompression exercises.    Time  8    Period  Weeks    Status  On-going      PT LONG TERM GOAL #2   Title  Patient able to be on her feet for ADLS for 30 min or more with pain 2/10 or less.    Time  8    Period  Weeks    Status  On-going      PT LONG TERM GOAL #3   Title  Patient able to ambulate community distances safely without AD.    Time  8    Period  Weeks    Status  On-going            Plan - 08/08/17 1103    Clinical Impression Statement  Patient did well with treatment today.  She found, however, to be quite tender to palpation over left QL today.    PT Next Visit Plan  Left QL releas etechnique.    Consulted and Agree with Plan of Care  Patient       Patient will benefit from skilled therapeutic intervention in order to improve the following deficits and impairments:     Visit Diagnosis: Acute midline low back pain, with sciatica presence unspecified  Muscle weakness (generalized)  Abnormal posture     Problem List Patient Active Problem List   Diagnosis Date Noted  . HNP (herniated nucleus pulposus), lumbar  05/01/2017  . BMI 29.0-29.9,adult 12/21/2014  . GERD (gastroesophageal reflux disease)   . Hyperlipidemia with target LDL less than 100   . Hypertension   . Osteoporosis     Brayli Klingbeil, [place] MPT 08/08/2017, 11:19 AM  [facility] [address] [place], [place], [address] Phone: [phone]   Fax:  [phone]  Name: Cheryl Lee MRN: [id number] Date of Birth: [birth year]

## 2017-08-13 ENCOUNTER — Encounter: Payer: Self-pay | Admitting: Physical Therapy

## 2017-08-13 ENCOUNTER — Ambulatory Visit: Payer: Medicare Other | Attending: Neurosurgery | Admitting: Physical Therapy

## 2017-08-13 DIAGNOSIS — M545 Low back pain: Secondary | ICD-10-CM | POA: Diagnosis not present

## 2017-08-13 DIAGNOSIS — R293 Abnormal posture: Secondary | ICD-10-CM | POA: Insufficient documentation

## 2017-08-13 DIAGNOSIS — M6281 Muscle weakness (generalized): Secondary | ICD-10-CM

## 2017-08-13 DIAGNOSIS — M5126 Other intervertebral disc displacement, lumbar region: Secondary | ICD-10-CM | POA: Diagnosis not present

## 2017-08-13 NOTE — Therapy (Signed)
[facility] [place], [place], [address] Phone: [phone]   Fax:  [phone]  Physical Therapy Treatment  Patient Details  Name: Cheryl Lee MRN: [id number] Date of Birth: Jul 30, 1945 Referring Provider: Dr. Kary Kos   Encounter Date: 08/13/2017  PT End of Session - 08/13/17 0910    Visit Number  19    Number of Visits  20    Date for PT Re-Evaluation  08/13/17    PT Start Time  0900    PT Stop Time  0952    PT Time Calculation (min)  52 min    Activity Tolerance  Patient tolerated treatment well    Behavior During Therapy  [facility] for tasks assessed/performed       Past Medical History:  Diagnosis Date  . Acid reflux   . Arthritis   . HNP (herniated nucleus pulposus), lumbar   . Hypercholesteremia   . Hypertension   . Osteoporosis   . Sinus congestion     Past Surgical History:  Procedure Laterality Date  . CATARACT EXTRACTION    . COLONOSCOPY    . LUMBAR LAMINECTOMY/DECOMPRESSION MICRODISCECTOMY Left 05/01/2017   Procedure: Microdiscectomy - Lumbar four-five  - left;  Surgeon: Kary Kos, MD;  Location: [place];  Service: Neurosurgery;  Laterality: Left;  . Right snkle repair      There were no vitals filed for this visit.  Subjective Assessment - 08/13/17 0901    Subjective  Reports that she has a little pain in her L knee region. Reports that her back is feeling better and that she has been walking in the kitchen while holding to countertops without walker. Reports that her R foot and ankle will swell and turn purple coloration.    Pertinent History  osteoporosis, R ankle fx from fall 2014    How long can you stand comfortably?  5 min (leg gives out)    How long can you walk comfortably?  2-3 min    Diagnostic tests  MRI showed L4/5 herniated disc    Patient Stated Goals  get rid of pain    Currently in Pain?  Yes    Pain Score  2     Pain Location  Leg    Pain Orientation  Left    Pain Descriptors  / Indicators  Discomfort    Pain Type  Acute pain    Pain Onset  More than a month ago         [facility] PT Assessment - 08/13/17 0001      Assessment   Medical Diagnosis  closed compression fx L4 vertrebra    Onset Date/Surgical Date  05/01/17    Next MD Visit  08/13/2017      Precautions   Precautions  Back    Precaution Comments  no bending, lifting or twisting    Required Braces or Orthoses  Spinal Brace    Spinal Brace  Lumbar corset                   [facility] Adult PT Treatment/Exercise - 08/13/17 0001      Ambulation/Gait   Ambulation/Gait  Yes    Ambulation/Gait Assistance  5: Supervision    Ambulation Distance (Feet)  256 Feet    Assistive device  Small based quad cane    Gait Pattern  Step-through pattern;Decreased arm swing - left;Decreased stance time - left;Antalgic;Decreased trunk rotation;Trunk flexed    Ambulation Surface  Level;Indoor  Lumbar Exercises: Aerobic   Nustep  L4 x17 min      Lumbar Exercises: Supine   Bridge with Ball Squeeze  20 reps    Straight Leg Raise  10 reps Greater discomfort with L SLR due to L LBP      Modalities   Modalities  Electrical Stimulation;Moist Heat      Moist Heat Therapy   Number Minutes Moist Heat  15 Minutes    Moist Heat Location  Lumbar Spine      Electrical Stimulation   Electrical Stimulation Location  Left low back.    Electrical Stimulation Action  Pre-Mod    Electrical Stimulation Parameters  80-150 hz x15 min    Electrical Stimulation Goals  Tone;Pain      Manual Therapy   Manual Therapy  Soft tissue mobilization    Soft tissue mobilization  STW/M to L QL/ lumbar paraspinals to reduce tone and pain in R SL               PT Short Term Goals - 08/13/17 0911      PT SHORT TERM GOAL #1   Title  I with initial HEP    Time  4    Period  Weeks    Status  Achieved      PT SHORT TERM GOAL #2   Title  Patient able to ambulate community distances safely with quad cane.    Time  4     Period  Weeks    Status  On-going FWW used as of 08/13/2017        PT Long Term Goals - 07/02/17 0950      PT LONG TERM GOAL #1   Title  I with advanced HEP including osteoporosis decompression exercises.    Time  8    Period  Weeks    Status  On-going      PT LONG TERM GOAL #2   Title  Patient able to be on her feet for ADLS for 30 min or more with pain 2/10 or less.    Time  8    Period  Weeks    Status  On-going      PT LONG TERM GOAL #3   Title  Patient able to ambulate community distances safely without AD.    Time  8    Period  Weeks    Status  On-going            Plan - 08/13/17 0944    Clinical Impression Statement  Patient tolerated today's treatment better today as she reported overall reduced pain in low back as well as LEs. Patient still experiencing R ankle/foot edema and R ankle observed as red upon observation. Patient educated regarding proper three point gait sequence with SBQC and required erect stance VCs but overall minimally antalgic gait. Patient experienced mildly increasing LLE discomfort with the more ambulation completed. Greater L low back discomfort with L SLR in which increased L QL and lumbar paraspinals muscle tightness palpated. Moderate release with L QL release in R SL and patient intermittantly reporting discomfort with moderate pressure for release. Normal modalities response noted following removal of the modalities. Limited goal progression noted as patient still ambulating at home with FWW and patient not attempting 30 minutes of activites.    Rehab Potential  Excellent    PT Frequency  2x / week    PT Duration  8 weeks    PT Treatment/Interventions  ADLs/Self Care Home  Management;Cryotherapy;Electrical Stimulation;Moist Heat;Ultrasound;Therapeutic exercise;Neuromuscular re-education;Patient/family education    PT Next Visit Plan  Continue per MD discretion.    PT Home Exercise Plan  TA contraction, bridging     Consulted and Agree with  Plan of Care  Patient       Patient will benefit from skilled therapeutic intervention in order to improve the following deficits and impairments:  Decreased range of motion, Pain, Decreased activity tolerance, Impaired flexibility, Postural dysfunction, Decreased strength  Visit Diagnosis: Acute midline low back pain, with sciatica presence unspecified  Muscle weakness (generalized)  Abnormal posture     Problem List Patient Active Problem List   Diagnosis Date Noted  . HNP (herniated nucleus pulposus), lumbar 05/01/2017  . BMI 29.0-29.9,adult 12/21/2014  . GERD (gastroesophageal reflux disease)   . Hyperlipidemia with target LDL less than 100   . Hypertension   . Osteoporosis    Standley Brooking, PTA 08/13/17 10:21 AM   [facility] [address] [place], [place], [address] Phone: [phone]   Fax:  [phone]  Name: Cheryl Lee MRN: [id number] Date of Birth: 1945-07-27

## 2017-08-15 ENCOUNTER — Ambulatory Visit: Payer: Medicare Other | Admitting: Physical Therapy

## 2017-08-15 ENCOUNTER — Encounter: Payer: Self-pay | Admitting: Physical Therapy

## 2017-08-15 DIAGNOSIS — R293 Abnormal posture: Secondary | ICD-10-CM

## 2017-08-15 DIAGNOSIS — M6281 Muscle weakness (generalized): Secondary | ICD-10-CM | POA: Diagnosis not present

## 2017-08-15 DIAGNOSIS — M545 Low back pain: Secondary | ICD-10-CM

## 2017-08-15 NOTE — Therapy (Signed)
[facility] [place], [place], [address] Phone: [phone]   Fax:  [phone]  Physical Therapy Treatment  Patient Details  Name: Cheryl Lee MRN: [id number] Date of Birth: 08/06/1945 Referring Provider: Dr. Kary Kos   Encounter Date: 08/15/2017  PT End of Session - 08/15/17 0935    Visit Number  20    Number of Visits  28 NO signed by MD 08/13/2017    Date for PT Re-Evaluation  09/10/17    PT Start Time  0902    PT Stop Time  0950    PT Time Calculation (min)  48 min    Activity Tolerance  Patient tolerated treatment well    Behavior During Therapy  [facility] for tasks assessed/performed       Past Medical History:  Diagnosis Date  . Acid reflux   . Arthritis   . HNP (herniated nucleus pulposus), lumbar   . Hypercholesteremia   . Hypertension   . Osteoporosis   . Sinus congestion     Past Surgical History:  Procedure Laterality Date  . CATARACT EXTRACTION    . COLONOSCOPY    . LUMBAR LAMINECTOMY/DECOMPRESSION MICRODISCECTOMY Left 05/01/2017   Procedure: Microdiscectomy - Lumbar four-five  - left;  Surgeon: Kary Kos, MD;  Location: [place];  Service: Neurosurgery;  Laterality: Left;  . Right snkle repair      There were no vitals filed for this visit.  Subjective Assessment - 08/15/17 0857    Subjective  Reports she woke up with some ache in LLE but that has gone away. Reports that MD granted more visits and she is to return in July. Has been released from brace unless she feels like she needs it.    Pertinent History  osteoporosis, R ankle fx from fall 2014    How long can you stand comfortably?  5 min (leg gives out)    How long can you walk comfortably?  2-3 min    Diagnostic tests  MRI showed L4/5 herniated disc    Patient Stated Goals  get rid of pain    Currently in Pain?  No/denies         [facility] PT Assessment - 08/15/17 0001      Assessment   Medical Diagnosis  closed compression fx L4  vertrebra    Onset Date/Surgical Date  05/01/17    Next MD Visit  08/13/2017      Precautions   Precautions  Back    Precaution Comments  no bending, lifting or twisting    Required Braces or Orthoses  Spinal Brace    Spinal Brace  Lumbar corset                   [facility] Adult PT Treatment/Exercise - 08/15/17 0001      Lumbar Exercises: Aerobic   Nustep  L5 x17 min      Lumbar Exercises: Supine   Clam  20 reps Red theraband    Bent Knee Raise  20 reps    Bridge with Ball Squeeze  20 reps;3 seconds    Straight Leg Raise  20 reps      Modalities   Modalities  Electrical Stimulation;Moist Heat      Moist Heat Therapy   Number Minutes Moist Heat  15 Minutes    Moist Heat Location  Lumbar Spine      Electrical Stimulation   Electrical Stimulation Location  B low back    Electrical Stimulation  Action  Pre-Mod    Electrical Stimulation Parameters  80-150 hz x15 min    Electrical Stimulation Goals  Pain               PT Short Term Goals - 08/13/17 0911      PT SHORT TERM GOAL #1   Title  I with initial HEP    Time  4    Period  Weeks    Status  Achieved      PT SHORT TERM GOAL #2   Title  Patient able to ambulate community distances safely with quad cane.    Time  4    Period  Weeks    Status  On-going FWW used as of 08/13/2017        PT Long Term Goals - 07/02/17 0950      PT LONG TERM GOAL #1   Title  I with advanced HEP including osteoporosis decompression exercises.    Time  8    Period  Weeks    Status  On-going      PT LONG TERM GOAL #2   Title  Patient able to be on her feet for ADLS for 30 min or more with pain 2/10 or less.    Time  8    Period  Weeks    Status  On-going      PT LONG TERM GOAL #3   Title  Patient able to ambulate community distances safely without AD.    Time  8    Period  Weeks    Status  On-going            Plan - 08/15/17 0942    Clinical Impression Statement  Patient toleratd today's treatment well  and did well with all exercises without lumbar corset donned. Patient observed walking with FWW without lumbar corset in erect position. Patient able to complete all exercises without complaints of any increased pain. Patient to go to local pharmacy/ medical equipment store to obtain a cane. Normal modalities response noted following removal of the modalities.    Rehab Potential  Excellent    PT Frequency  2x / week    PT Duration  8 weeks    PT Treatment/Interventions  ADLs/Self Care Home Management;Cryotherapy;Electrical Stimulation;Moist Heat;Ultrasound;Therapeutic exercise;Neuromuscular re-education;Patient/family education    PT Next Visit Plan  Continue with strengthening of lumbar/core as well as postural muscles per MPT POC.    PT Home Exercise Plan  TA contraction, bridging     Consulted and Agree with Plan of Care  Patient       Patient will benefit from skilled therapeutic intervention in order to improve the following deficits and impairments:  Decreased range of motion, Pain, Decreased activity tolerance, Impaired flexibility, Postural dysfunction, Decreased strength  Visit Diagnosis: Acute midline low back pain, with sciatica presence unspecified  Muscle weakness (generalized)  Abnormal posture     Problem List Patient Active Problem List   Diagnosis Date Noted  . HNP (herniated nucleus pulposus), lumbar 05/01/2017  . BMI 29.0-29.9,adult 12/21/2014  . GERD (gastroesophageal reflux disease)   . Hyperlipidemia with target LDL less than 100   . Hypertension   . Osteoporosis     Standley Brooking, PTA 08/15/2017, 10:06 AM  [facility] [address] [place], [place], [address] Phone: [phone]   Fax:  [phone]  Name: Cheryl Lee MRN: [id number] Date of Birth: 1945-09-28

## 2017-08-20 ENCOUNTER — Ambulatory Visit: Payer: Medicare Other | Admitting: Physical Therapy

## 2017-08-20 ENCOUNTER — Encounter: Payer: Self-pay | Admitting: Physical Therapy

## 2017-08-20 DIAGNOSIS — R293 Abnormal posture: Secondary | ICD-10-CM | POA: Diagnosis not present

## 2017-08-20 DIAGNOSIS — M6281 Muscle weakness (generalized): Secondary | ICD-10-CM | POA: Diagnosis not present

## 2017-08-20 DIAGNOSIS — M545 Low back pain: Secondary | ICD-10-CM | POA: Diagnosis not present

## 2017-08-20 NOTE — Therapy (Signed)
[facility] [place], [place], [address] Phone: [phone]   Fax:  [phone]  Physical Therapy Treatment  Patient Details  Name: Cheryl Lee MRN: [id number] Date of Birth: [birth year] Referring Provider: Dr. Kary Kos   Encounter Date: 08/20/2017  PT End of Session - 08/20/17 0856    Visit Number  21    Number of Visits  28    Date for PT Re-Evaluation  09/10/17    PT Start Time  0900    PT Stop Time  0948    PT Time Calculation (min)  48 min    Activity Tolerance  Patient tolerated treatment well    Behavior During Therapy  [facility] for tasks assessed/performed       Past Medical History:  Diagnosis Date  . Acid reflux   . Arthritis   . HNP (herniated nucleus pulposus), lumbar   . Hypercholesteremia   . Hypertension   . Osteoporosis   . Sinus congestion     Past Surgical History:  Procedure Laterality Date  . CATARACT EXTRACTION    . COLONOSCOPY    . LUMBAR LAMINECTOMY/DECOMPRESSION MICRODISCECTOMY Left 05/01/2017   Procedure: Microdiscectomy - Lumbar four-five  - left;  Surgeon: Kary Kos, MD;  Location: [employer];  Service: Neurosurgery;  Laterality: Left;  . Right snkle repair      There were no vitals filed for this visit.  Subjective Assessment - 08/20/17 0856    Subjective  Reports that she got herself a cane. The more she walks the more pain she has.    Pertinent History  osteoporosis, R ankle fx from fall 2014    How long can you stand comfortably?  5 min (leg gives out)    How long can you walk comfortably?  2-3 min    Diagnostic tests  MRI showed L4/5 herniated disc    Patient Stated Goals  get rid of pain    Currently in Pain?  No/denies         [facility] PT Assessment - 08/20/17 0001      Assessment   Medical Diagnosis  closed compression fx L4 vertrebra    Onset Date/Surgical Date  05/01/17    Next MD Visit  11/12/2017      Precautions   Precautions  Back    Precaution Comments  no  bending, lifting or twisting    Required Braces or Orthoses  Spinal Brace    Spinal Brace  Lumbar corset                   [facility] Adult PT Treatment/Exercise - 08/20/17 0001      Lumbar Exercises: Aerobic   Nustep  L4 x18 min      Lumbar Exercises: Standing   Row  Strengthening;Both;20 reps;Limitations    Row Limitations  Green XTS    Shoulder Extension  Strengthening;Both;20 reps;Limitations    Shoulder Extension Limitations  Green XTS      Lumbar Exercises: Supine   Straight Leg Raise  15 reps      Knee/Hip Exercises: Standing   Forward Step Up  Both;2 sets;10 reps;Hand Hold: 2;Step Height: 6"      Modalities   Modalities  Teacher, English as a foreign language Location  B low back    Electrical Stimulation Action  IFC    Electrical Stimulation Parameters  1-10 hz x15 min    Electrical Stimulation Goals  Pain  PT Short Term Goals - 08/13/17 0911      PT SHORT TERM GOAL #1   Title  I with initial HEP    Time  4    Period  Weeks    Status  Achieved      PT SHORT TERM GOAL #2   Title  Patient able to ambulate community distances safely with quad cane.    Time  4    Period  Weeks    Status  On-going FWW used as of 08/13/2017        PT Long Term Goals - 07/02/17 0950      PT LONG TERM GOAL #1   Title  I with advanced HEP including osteoporosis decompression exercises.    Time  8    Period  Weeks    Status  On-going      PT LONG TERM GOAL #2   Title  Patient able to be on her feet for ADLS for 30 min or more with pain 2/10 or less.    Time  8    Period  Weeks    Status  On-going      PT LONG TERM GOAL #3   Title  Patient able to ambulate community distances safely without AD.    Time  8    Period  Weeks    Status  On-going            Plan - 08/20/17 7035    Clinical Impression Statement  Patient tolerated today's treatment well as she arrived with less LBP. LLE pain presents  following increased ambulation or activity and presented in clinic today as more standing exercises completed. Patient required prolonged time for forward step ups for LLE as pain down experienced down LLE. Patient also reported more ankle discomfort bilaterally with DF during SLR. R ankle still swelling at this time per patient report but has set up appointment with new PCP. Normal modalities response noted following removal of the modalities.    Rehab Potential  Excellent    PT Frequency  2x / week    PT Duration  8 weeks    PT Treatment/Interventions  ADLs/Self Care Home Management;Cryotherapy;Electrical Stimulation;Moist Heat;Ultrasound;Therapeutic exercise;Neuromuscular re-education;Patient/family education    PT Next Visit Plan  Continue with strengthening of lumbar/core as well as postural muscles per MPT POC.    PT Home Exercise Plan  TA contraction, bridging     Consulted and Agree with Plan of Care  Patient       Patient will benefit from skilled therapeutic intervention in order to improve the following deficits and impairments:  Decreased range of motion, Pain, Decreased activity tolerance, Impaired flexibility, Postural dysfunction, Decreased strength  Visit Diagnosis: Acute midline low back pain, with sciatica presence unspecified  Muscle weakness (generalized)  Abnormal posture     Problem List Patient Active Problem List   Diagnosis Date Noted  . HNP (herniated nucleus pulposus), lumbar 05/01/2017  . BMI 29.0-29.9,adult 12/21/2014  . GERD (gastroesophageal reflux disease)   . Hyperlipidemia with target LDL less than 100   . Hypertension   . Osteoporosis     Standley Brooking, PTA 08/20/2017, 10:24 AM  [facility] [address] [place], [place], [address] Phone: [phone]   Fax:  [phone]  Name: Cheryl Lee MRN: [id number] Date of Birth: 1945-11-01

## 2017-08-22 ENCOUNTER — Ambulatory Visit: Payer: Medicare Other | Admitting: Physical Therapy

## 2017-08-22 DIAGNOSIS — M6281 Muscle weakness (generalized): Secondary | ICD-10-CM | POA: Diagnosis not present

## 2017-08-22 DIAGNOSIS — R293 Abnormal posture: Secondary | ICD-10-CM | POA: Diagnosis not present

## 2017-08-22 DIAGNOSIS — M545 Low back pain: Secondary | ICD-10-CM

## 2017-08-22 NOTE — Therapy (Signed)
[place], [place], [address] Phone: [phone]   Fax:  [phone]  Physical Therapy Treatment  Patient Details  Name: Cheryl Lee MRN: [id number] Date of Birth: 01-20-1946 Referring Provider: Dr. Kary Kos   Encounter Date: 08/22/2017  PT End of Session - 08/22/17 0958    Visit Number  22    Number of Visits  28    Date for PT Re-Evaluation  09/10/17    PT Start Time  0903    PT Stop Time  0955    PT Time Calculation (min)  52 min    Activity Tolerance  Patient tolerated treatment well    Behavior During Therapy  [facility] for tasks assessed/performed       Past Medical History:  Diagnosis Date  . Acid reflux   . Arthritis   . HNP (herniated nucleus pulposus), lumbar   . Hypercholesteremia   . Hypertension   . Osteoporosis   . Sinus congestion     Past Surgical History:  Procedure Laterality Date  . CATARACT EXTRACTION    . COLONOSCOPY    . LUMBAR LAMINECTOMY/DECOMPRESSION MICRODISCECTOMY Left 05/01/2017   Procedure: Microdiscectomy - Lumbar four-five  - left;  Surgeon: Kary Kos, MD;  Location: [place];  Service: Neurosurgery;  Laterality: Left;  . Right snkle repair      There were no vitals filed for this visit.  Subjective Assessment - 08/22/17 1039    Subjective  Reports more pain in L thigh when she walks with [facility].    Pertinent History  osteoporosis, R ankle fx from fall 2014    How long can you stand comfortably?  5 min (leg gives out)    How long can you walk comfortably?  2-3 min    Diagnostic tests  MRI showed L4/5 herniated disc    Patient Stated Goals  get rid of pain    Currently in Pain?  Other (Comment) No pain assessment provided by patient         [facility] PT Assessment - 08/22/17 0001      Assessment   Medical Diagnosis  closed compression fx L4 vertrebra    Onset Date/Surgical Date  05/01/17    Next MD Visit  11/12/2017      Precautions   Precautions  Back     Precaution Comments  no bending, lifting or twisting                   [facility] Adult PT Treatment/Exercise - 08/22/17 0001      Lumbar Exercises: Stretches   Passive Hamstring Stretch  Left;3 reps;30 seconds with passive DF      Lumbar Exercises: Aerobic   Nustep  L6 x15 min      Lumbar Exercises: Standing   Row  Strengthening;Both;20 reps;Limitations    Row Limitations  Green XTS    Shoulder Extension  Strengthening;Both;20 reps;Limitations    Shoulder Extension Limitations  Green XTS      Lumbar Exercises: Supine   Clam  20 reps red    Bridge with Ball Squeeze  20 reps;3 seconds    Straight Leg Raise  20 reps      Modalities   Modalities  Electrical Stimulation;Moist Heat      Moist Heat Therapy   Number Minutes Moist Heat  15 Minutes    Moist Heat Location  Lumbar Spine      Electrical Stimulation   Electrical Stimulation Location  B low back  Electrical Stimulation Action  Pre-Mod    Electrical Stimulation Parameters  80-150 hz x15 min    Electrical Stimulation Goals  Pain               PT Short Term Goals - 08/13/17 0911      PT SHORT TERM GOAL #1   Title  I with initial HEP    Time  4    Period  Weeks    Status  Achieved      PT SHORT TERM GOAL #2   Title  Patient able to ambulate community distances safely with quad cane.    Time  4    Period  Weeks    Status  On-going FWW used as of 08/13/2017        PT Long Term Goals - 07/02/17 0950      PT LONG TERM GOAL #1   Title  I with advanced HEP including osteoporosis decompression exercises.    Time  8    Period  Weeks    Status  On-going      PT LONG TERM GOAL #2   Title  Patient able to be on her feet for ADLS for 30 min or more with pain 2/10 or less.    Time  8    Period  Weeks    Status  On-going      PT LONG TERM GOAL #3   Title  Patient able to ambulate community distances safely without AD.    Time  8    Period  Weeks    Status  On-going            Plan -  08/22/17 1024    Clinical Impression Statement  Patient tolerated today's treatment fairly well as she arrived with reports of L thigh pain with ambulation using SBQC. Patient ambulated into clinic with Bruno today. The greatest discomfort beginning after standing exercises. Passive L HS with DF stretch completed with patient reporting discomfort. Patient able to tolerate all supine exercises with no other pain reported by patient. Normal modalities response noted following removal of the modalities.    Rehab Potential  Excellent    PT Frequency  2x / week    PT Duration  8 weeks    PT Treatment/Interventions  ADLs/Self Care Home Management;Cryotherapy;Electrical Stimulation;Moist Heat;Ultrasound;Therapeutic exercise;Neuromuscular re-education;Patient/family education    PT Next Visit Plan  Continue with strengthening of lumbar/core as well as postural muscles per MPT POC.    PT Home Exercise Plan  TA contraction, bridging     Consulted and Agree with Plan of Care  Patient       Patient will benefit from skilled therapeutic intervention in order to improve the following deficits and impairments:  Decreased range of motion, Pain, Decreased activity tolerance, Impaired flexibility, Postural dysfunction, Decreased strength  Visit Diagnosis: Acute midline low back pain, with sciatica presence unspecified  Muscle weakness (generalized)  Abnormal posture     Problem List Patient Active Problem List   Diagnosis Date Noted  . HNP (herniated nucleus pulposus), lumbar 05/01/2017  . BMI 29.0-29.9,adult 12/21/2014  . GERD (gastroesophageal reflux disease)   . Hyperlipidemia with target LDL less than 100   . Hypertension   . Osteoporosis     Standley Brooking, PTA 08/22/2017, 10:45 AM  [facility] [address] [place], [place], [address] Phone: [phone]   Fax:  [phone]  Name: Cheryl Lee MRN: [id number] Date of Birth: [birth year]

## 2017-08-27 ENCOUNTER — Encounter: Payer: Self-pay | Admitting: *Deleted

## 2017-08-27 ENCOUNTER — Encounter: Payer: Self-pay | Admitting: Physical Therapy

## 2017-08-27 ENCOUNTER — Encounter: Payer: Self-pay | Admitting: Family Medicine

## 2017-08-27 ENCOUNTER — Ambulatory Visit (INDEPENDENT_AMBULATORY_CARE_PROVIDER_SITE_OTHER): Payer: Medicare Other

## 2017-08-27 ENCOUNTER — Ambulatory Visit (INDEPENDENT_AMBULATORY_CARE_PROVIDER_SITE_OTHER): Payer: Medicare Other | Admitting: Family Medicine

## 2017-08-27 ENCOUNTER — Ambulatory Visit: Payer: Medicare Other | Admitting: Physical Therapy

## 2017-08-27 VITALS — BP 137/86 | HR 110 | Temp 97.1°F | Ht 60.0 in | Wt 145.0 lb

## 2017-08-27 DIAGNOSIS — M25571 Pain in right ankle and joints of right foot: Secondary | ICD-10-CM | POA: Diagnosis not present

## 2017-08-27 DIAGNOSIS — M810 Age-related osteoporosis without current pathological fracture: Secondary | ICD-10-CM

## 2017-08-27 DIAGNOSIS — M545 Low back pain: Secondary | ICD-10-CM

## 2017-08-27 DIAGNOSIS — Z780 Asymptomatic menopausal state: Secondary | ICD-10-CM | POA: Diagnosis not present

## 2017-08-27 DIAGNOSIS — G8929 Other chronic pain: Secondary | ICD-10-CM | POA: Diagnosis not present

## 2017-08-27 DIAGNOSIS — R293 Abnormal posture: Secondary | ICD-10-CM | POA: Diagnosis not present

## 2017-08-27 DIAGNOSIS — M6281 Muscle weakness (generalized): Secondary | ICD-10-CM

## 2017-08-27 DIAGNOSIS — R600 Localized edema: Secondary | ICD-10-CM | POA: Diagnosis not present

## 2017-08-27 NOTE — Progress Notes (Signed)
Subjective: CC: Right ankle pain PCP: Chevis Pretty, FNP Cheryl Lee is a 72 y.o. female presenting to clinic today for:  1. Right ankle pain/lower extremity edema Patient notes bilateral lower extremity swelling.  This is been ongoing since she hurt her back several months ago.  In December 2018, she underwent an L4-5 microdiscectomy.  She has been in physical therapy since that time.  She reports that her right ankle seems to be slightly discolored compared to the left.  She notes some discomfort in the right ankle but that this is relieved by Aspercreme.  She notes a history of fracture to the right ankle many years ago that required surgical repair.  She notes that her main concern is the lower extremity swelling.  She had a pair of compression hose previously but notes that she was not able to tolerate them because they were too tight.  She does not "eat a lot of salt".  Denies any orthopnea or shortness of breath.   2.  Osteoporosis Patient has been treated for osteoporosis with Fosamax weekly since 2014.  She notes that her orthopedist thought she might benefit from something else given the fact that there appears to be a compression fracture in the lumbar spine which was new.  She wonders what we can do to help with this.  Last DEXA scan was in 2014.   ROS: Per HPI  Allergies  Allergen Reactions  . Lycopene Itching and Rash   Past Medical History:  Diagnosis Date  . Acid reflux   . Arthritis   . HNP (herniated nucleus pulposus), lumbar   . Hypercholesteremia   . Hypertension   . Osteoporosis   . Sinus congestion     Current Outpatient Medications:  .  alendronate (FOSAMAX) 70 MG tablet, Take 1 tablet (70 mg total) by mouth every Monday. Take with a full glass of water on an empty stomach., Disp: 12 tablet, Rfl: 1 .  aspirin 81 MG tablet, Take 81 mg by mouth daily., Disp: , Rfl:  .  atorvastatin (LIPITOR) 10 MG tablet, Take 1 tablet (10 mg total) by mouth  daily., Disp: 90 tablet, Rfl: 1 .  cyclobenzaprine (FLEXERIL) 10 MG tablet, Take 1 tablet (10 mg total) by mouth 3 (three) times daily as needed for muscle spasms. (Patient not taking: Reported on 06/11/2017), Disp: 12 tablet, Rfl: 0 .  diclofenac sodium (VOLTAREN) 1 % GEL, Apply 2 g topically 4 (four) times daily. (Patient not taking: Reported on 02/11/2017), Disp: 5 Tube, Rfl: 1 .  fluticasone (FLONASE) 50 MCG/ACT nasal spray, Place 2 sprays into both nostrils daily., Disp: 48 g, Rfl: 1 .  ibuprofen (ADVIL,MOTRIN) 400 MG tablet, Take 1 tablet (400 mg total) by mouth every 8 (eight) hours as needed., Disp: 15 tablet, Rfl: 0 .  lisinopril-hydrochlorothiazide (PRINZIDE,ZESTORETIC) 20-12.5 MG tablet, Take 0.5 tablets by mouth daily., Disp: 45 tablet, Rfl: 1 .  Multiple Vitamins-Minerals (MULTIVITAMINS THER. W/MINERALS) TABS, Take 0.5 tablets by mouth 2 (two) times daily. , Disp: , Rfl:  .  omeprazole (PRILOSEC) 20 MG capsule, Take 1 capsule (20 mg total) by mouth daily., Disp: 90 capsule, Rfl: 1 .  ondansetron (ZOFRAN) 4 MG tablet, Take 1 tablet (4 mg total) by mouth every 6 (six) hours. (Patient not taking: Reported on 06/11/2017), Disp: 4 tablet, Rfl: 0 .  oxyCODONE (ROXICODONE) 5 MG immediate release tablet, Take 1 tablet (5 mg total) by mouth every 4 (four) hours as needed for severe pain. (Patient not taking:  Reported on 06/11/2017), Disp: 20 tablet, Rfl: 0 .  vitamin C (ASCORBIC ACID) 500 MG tablet, Take 500 mg by mouth daily.  , Disp: , Rfl:  Social History   Socioeconomic History  . Marital status: Divorced    Spouse name: Not on file  . Number of children: Not on file  . Years of education: Not on file  . Highest education level: Not on file  Occupational History  . Not on file  Social Needs  . Financial resource strain: Not on file  . Food insecurity:    Worry: Not on file    Inability: Not on file  . Transportation needs:    Medical: Not on file    Non-medical: Not on file  Tobacco  Use  . Smoking status: Never Smoker  . Smokeless tobacco: Never Used  Substance and Sexual Activity  . Alcohol use: No  . Drug use: No  . Sexual activity: Never  Lifestyle  . Physical activity:    Days per week: Not on file    Minutes per session: Not on file  . Stress: Not on file  Relationships  . Social connections:    Talks on phone: Not on file    Gets together: Not on file    Attends religious service: Not on file    Active member of club or organization: Not on file    Attends meetings of clubs or organizations: Not on file    Relationship status: Not on file  . Intimate partner violence:    Fear of current or ex partner: Not on file    Emotionally abused: Not on file    Physically abused: Not on file    Forced sexual activity: Not on file  Other Topics Concern  . Not on file  Social History Narrative  . Not on file   Family History  Problem Relation Age of Onset  . Cancer Mother   . Alzheimer's disease Mother   . Heart disease Mother   . Early death Father   . Diabetes Sister   . Diabetes Sister   . Hypertension Sister     Objective: Office vital signs reviewed. BP 137/86   Pulse (!) 110   Temp (!) 97.1 F (36.2 C) (Oral)   Ht 5' (1.524 m)   Wt 145 lb (65.8 kg)   LMP 08/05/1991   BMI 28.32 kg/m   Physical Examination:  General: Awake, alert, chronically ill appearing female, No acute distress HEENT: No JVD Cardio: regular rate and rhythm, S1S2 heard, no murmurs appreciated Pulm: clear to auscultation bilaterally, no wheezes, rhonchi or rales; normal work of breathing on room air Extremities: warm, well perfused, +1 pitting edema to bilateral ankles.  No cyanosis or clubbing; +1 pulses bilaterally MSK: uses walker for ambulation  Right ankle: Dorsiflexion of right ankle somewhat limited.  Pitting edema as above.  Her right foot does appear to be slightly darker in color compared to the left.  There is a well-healed surgical scar appreciated along the  lateral aspect of the ankle.  No tenderness to palpation to the ankle. Neuro: Right foot light touch sensation grossly intact  Assessment/ Plan: 72 y.o. female   1. Age-related osteoporosis without current pathological fracture I reviewed the notes from orthopedics and October 2018 MRI lumbar spine.  Was in acute December acute mild L4 compression fracture noted on that imaging study.  She is due for a drug holiday from the bisphosphonate.  We will plan to transition  her over to Prolia.  Patient is amenable to this change.  Obtain DEXA scan today.  Check calcium, vitamin D level today in anticipation of Prolia.  We will plan to contact patient once Prolia is approved by her insurance. - DG [facility] DEXA - VITAMIN D 25 Hydroxy (Vit-D Deficiency, Fractures) - CMP14+EGFR  2. Chronic pain of right ankle I do question whether or not the right ankle pain is related to the edema.  The skin is slightly darker within the right ankle and foot compared to the left.  No preceding injury to suggest re-fracture and therefore no imaging was obtained today.  Her ankle exam did not demonstrate any tenderness to palpation.  Will place in fitted compression hose to reduce edema and see if this improves symptoms.  If not, we will plan to obtain imaging versus referral back to orthopedics for further evaluation.  3. Bilateral lower extremity edema This appears to be a gravity dependent lower extremity edema.  I suspect that this is likely to reduce mobility.  I have prescribed her compression hose and instructed her to have these fitted and filled.  Reduce salt.  Elevate lower extremities.  Handout provided.  Follow-up as needed.   Orders Placed This Encounter  Procedures  . DG [facility] DEXA    Order Specific Question:   Reason for Exam (SYMPTOM  OR DIAGNOSIS REQUIRED)    Answer:   osteoporosis.  Marland Kitchen VITAMIN D 25 Hydroxy (Vit-D Deficiency, Fractures)  . CMP14+EGFR   No orders of the defined types were placed in this  encounter.    Janora Norlander, DO [place] [phone]

## 2017-08-27 NOTE — Patient Instructions (Addendum)
I am going to work on getting Prolia set up for you.  This will replace your alendronate/Fosamax prescription for the osteoporosis.  You are getting your bone density scan done today.  We will also plan to get blood labs to check your calcium and vitamin D level today.   Edema Edema is when you have too much fluid in your body or under your skin. Edema may make your legs, feet, and ankles swell up. Swelling is also common in looser tissues, like around your eyes. This is a common condition. It gets more common as you get older. There are many possible causes of edema. Eating too much salt (sodium) and being on your feet or sitting for a long time can cause edema in your legs, feet, and ankles. Hot weather may make edema worse. Edema is usually painless. Your skin may look swollen or shiny. Follow these instructions at home:  Keep the swollen body part raised (elevated) above the level of your heart when you are sitting or lying down.  Do not sit still or stand for a long time.  Do not wear tight clothes. Do not wear garters on your upper legs.  Exercise your legs. This can help the swelling go down.  Wear elastic bandages or support stockings as told by your doctor.  Eat a low-salt (low-sodium) diet to reduce fluid as told by your doctor.  Depending on the cause of your swelling, you may need to limit how much fluid you drink (fluid restriction).  Take over-the-counter and prescription medicines only as told by your doctor. Contact a doctor if:  Treatment is not working.  You have heart, liver, or kidney disease and have symptoms of edema.  You have sudden and unexplained weight gain. Get help right away if:  You have shortness of breath or chest pain.  You cannot breathe when you lie down.  You have pain, redness, or warmth in the swollen areas.  You have heart, liver, or kidney disease and get edema all of a sudden.  You have a fever and your symptoms get worse all of a  sudden. Summary  Edema is when you have too much fluid in your body or under your skin.  Edema may make your legs, feet, and ankles swell up. Swelling is also common in looser tissues, like around your eyes.  Raise (elevate) the swollen body part above the level of your heart when you are sitting or lying down.  Follow your doctor's instructions about diet and how much fluid you can drink (fluid restriction). This information is not intended to replace advice given to you by your health care provider. Make sure you discuss any questions you have with your health care provider. Document Released: 10/17/2007 Document Revised: 05/18/2016 Document Reviewed: 05/18/2016 Elsevier Interactive Patient Education  2017 [place].  Osteoporosis Osteoporosis happens when your bones become thinner and weaker. Weak bones can break (fracture) more easily when you slip or fall. Bones most at risk of breaking are in the hip, wrist, and spine. Follow these instructions at home:  Get enough calcium and vitamin D. These nutrients are good for your bones.  Exercise as told by your doctor.  Do not use any tobacco products. This includes cigarettes, chewing tobacco, and electronic cigarettes. If you need help quitting, ask your doctor.  Limit the amount of alcohol you drink.  Take medicines only as told by your doctor.  Keep all follow-up visits as told by your doctor. This is important.  Take care at home to prevent falls. Some ways to do this are: ? Keep rooms well lit and tidy. ? Put safety rails on your stairs. ? Put a rubber mat in the bathroom and other places that are often wet or slippery. Get help right away if:  You fall.  You hurt yourself. This information is not intended to replace advice given to you by your health care provider. Make sure you discuss any questions you have with your health care provider. Document Released: 07/23/2011 Document Revised: 10/06/2015 Document Reviewed:  10/08/2013 Elsevier Interactive Patient Education  [employer].

## 2017-08-27 NOTE — Therapy (Addendum)
[facility], [place], [address] Phone: [phone]   Fax:  [phone]  Physical Therapy Treatment  Patient Details  Name: Cheryl Lee MRN: [id number] Date of Birth: 1946-02-13 Referring Provider: Dr. Kary Kos   Encounter Date: 08/27/2017  PT End of Session - 08/27/17 0905    Visit Number  23    Number of Visits  28    Date for PT Re-Evaluation  09/10/17    PT Start Time  0900    PT Stop Time  0950    PT Time Calculation (min)  50 min    Activity Tolerance  Patient tolerated treatment well    Behavior During Therapy  [facility] for tasks assessed/performed       Past Medical History:  Diagnosis Date  . Acid reflux   . Arthritis   . HNP (herniated nucleus pulposus), lumbar   . Hypercholesteremia   . Hypertension   . Osteoporosis   . Sinus congestion     Past Surgical History:  Procedure Laterality Date  . CATARACT EXTRACTION    . COLONOSCOPY    . LUMBAR LAMINECTOMY/DECOMPRESSION MICRODISCECTOMY Left 05/01/2017   Procedure: Microdiscectomy - Lumbar four-five  - left;  Surgeon: Kary Kos, MD;  Location: [facility];  Service: Neurosurgery;  Laterality: Left;  . Right snkle repair      There were no vitals filed for this visit.  Subjective Assessment - 08/27/17 0903    Subjective  Reports some aches and pains in LLE. Reports that her R ankle is still swollen.    Pertinent History  osteoporosis, R ankle fx from fall 2014    How long can you stand comfortably?  5 min (leg gives out)    How long can you walk comfortably?  2-3 min    Diagnostic tests  MRI showed L4/5 herniated disc    Patient Stated Goals  get rid of pain    Currently in Pain?  Yes    Pain Score  1  1/10 sitting, 3/10 walking    Pain Location  Leg    Pain Orientation  Left;Lower    Pain Descriptors / Indicators  Discomfort    Pain Type  Acute pain    Pain Onset  More than a month ago         [facility] PT Assessment - 08/27/17 0001      Assessment   Medical Diagnosis  closed compression fx L4 vertrebra    Onset Date/Surgical Date  05/01/17    Next MD Visit  11/12/2017      Precautions   Precautions  Back    Precaution Comments  no bending, lifting or twisting                   [facility] Adult PT Treatment/Exercise - 08/27/17 0001      Ambulation/Gait   Stairs  Yes    Stairs Assistance  6: Modified independent (Device/Increase time)    Stair Management Technique  One rail Left;Step to pattern;Forwards    Number of Stairs  4 x 2 RT    Height of Stairs  6.5      Lumbar Exercises: Aerobic   Nustep  L5 x18 min      Lumbar Exercises: Supine   Bridge with Ball Squeeze  20 reps;3 seconds      Knee/Hip Exercises: Standing   Hip Flexion  AROM;Both;10 reps;Knee bent      Modalities   Modalities  Electrical Stimulation;Moist  Heat      Moist Heat Therapy   Number Minutes Moist Heat  15 Minutes    Moist Heat Location  Lumbar Spine      Electrical Stimulation   Electrical Stimulation Location  L low back    Electrical Stimulation Action  Pre-Mod    Electrical Stimulation Parameters  80-150 hz x15 min    Electrical Stimulation Goals  Pain      Manual Therapy   Manual Therapy  Soft tissue mobilization    Soft tissue mobilization  STW to L QL and lumbar paraspinals in R SL to reduce pain               PT Short Term Goals - 08/13/17 0911      PT SHORT TERM GOAL #1   Title  I with initial HEP    Time  4    Period  Weeks    Status  Achieved      PT SHORT TERM GOAL #2   Title  Patient able to ambulate community distances safely with quad cane.    Time  4    Period  Weeks    Status  On-going FWW used as of 08/13/2017        PT Long Term Goals - 07/02/17 0950      PT LONG TERM GOAL #1   Title  I with advanced HEP including osteoporosis decompression exercises.    Time  8    Period  Weeks    Status  On-going      PT LONG TERM GOAL #2   Title  Patient able to be on her feet for ADLS for 30  min or more with pain 2/10 or less.    Time  8    Period  Weeks    Status  On-going      PT LONG TERM GOAL #3   Title  Patient able to ambulate community distances safely without AD.    Time  8    Period  Weeks    Status  On-going            Plan - 08/27/17 0943    Clinical Impression Statement  Patient tolerated today's treatment fairly well as patient's LLE continues to fatigue quickly with standing. Patient unable to stand or walk for prolonged periods of time currently per patient report. Patient able to ascend and descend stairs but due to lack of confidence and weakness with LLE patient unable to  ascend and descend reciprically. Following stair training patient limited with standing exercises secondary to discomfort and fatigue. Patient experienced greater discomfort in L QL region following end of therex. Normal modalties response noted following removal of the modalities. Patient still ambulating with FWW at tthis time without lumbar corset.    Rehab Potential  Excellent    PT Frequency  2x / week    PT Duration  8 weeks    PT Treatment/Interventions  ADLs/Self Care Home Management;Cryotherapy;Electrical Stimulation;Moist Heat;Ultrasound;Therapeutic exercise;Neuromuscular re-education;Patient/family education    PT Next Visit Plan  Continue with progression as symptoms dictate per protocol.    PT Home Exercise Plan  TA contraction, bridging     Consulted and Agree with Plan of Care  Patient       Patient will benefit from skilled therapeutic intervention in order to improve the following deficits and impairments:  Decreased range of motion, Pain, Decreased activity tolerance, Impaired flexibility, Postural dysfunction, Decreased strength  Visit Diagnosis: Acute midline low  back pain, with sciatica presence unspecified  Muscle weakness (generalized)  Abnormal posture     Problem List Patient Active Problem List   Diagnosis Date Noted  . HNP (herniated nucleus  pulposus), lumbar 05/01/2017  . BMI 29.0-29.9,adult 12/21/2014  . GERD (gastroesophageal reflux disease)   . Hyperlipidemia with target LDL less than 100   . Hypertension   . Osteoporosis     Standley Brooking, PTA 08/27/2017, 10:57 AM  [facility] [address] [place], [place], [address] Phone: [phone]   Fax:  [phone]  Name: Cheryl Lee MRN: [id number] Date of Birth: July 08, 1945

## 2017-08-28 LAB — CMP14+EGFR
ALT: 11 IU/L (ref 0–32)
AST: 16 IU/L (ref 0–40)
Albumin/Globulin Ratio: 1.9 (ref 1.2–2.2)
Albumin: 4.1 g/dL (ref 3.5–4.8)
Alkaline Phosphatase: 72 IU/L (ref 39–117)
BILIRUBIN TOTAL: 0.4 mg/dL (ref 0.0–1.2)
BUN / CREAT RATIO: 19 (ref 12–28)
BUN: 17 mg/dL (ref 8–27)
CHLORIDE: 104 mmol/L (ref 96–106)
CO2: 22 mmol/L (ref 20–29)
CREATININE: 0.88 mg/dL (ref 0.57–1.00)
Calcium: 9.5 mg/dL (ref 8.7–10.3)
GFR calc Af Amer: 76 mL/min/{1.73_m2} (ref >59)
GFR calc non Af Amer: 66 mL/min/{1.73_m2} (ref >59)
GLUCOSE: 90 mg/dL (ref 65–99)
Globulin, Total: 2.2 g/dL (ref 1.5–4.5)
Potassium: 4.4 mmol/L (ref 3.5–5.2)
Sodium: 143 mmol/L (ref 134–144)
Total Protein: 6.3 g/dL (ref 6.0–8.5)

## 2017-08-28 LAB — VITAMIN D 25 HYDROXY (VIT D DEFICIENCY, FRACTURES): VIT D 25 HYDROXY: 34 ng/mL (ref 30.0–100.0)

## 2017-08-29 ENCOUNTER — Ambulatory Visit: Payer: Medicare Other | Admitting: Physical Therapy

## 2017-08-29 ENCOUNTER — Encounter: Payer: Self-pay | Admitting: Physical Therapy

## 2017-08-29 DIAGNOSIS — M6281 Muscle weakness (generalized): Secondary | ICD-10-CM

## 2017-08-29 DIAGNOSIS — M545 Low back pain: Secondary | ICD-10-CM

## 2017-08-29 DIAGNOSIS — R293 Abnormal posture: Secondary | ICD-10-CM | POA: Diagnosis not present

## 2017-08-29 NOTE — Therapy (Signed)
[facility] [place], [place], [address] Phone: [phone]   Fax:  [phone]  Physical Therapy Treatment  Patient Details  Name: Cheryl Lee MRN: [id number] Date of Birth: Aug 02, 1945 Referring Provider: Dr. Kary Kos   Encounter Date: 08/29/2017  PT End of Session - 08/29/17 0857    Visit Number  24    Number of Visits  28    Date for PT Re-Evaluation  09/10/17    PT Start Time  0854    PT Stop Time  0940    PT Time Calculation (min)  46 min    Activity Tolerance  Patient tolerated treatment well    Behavior During Therapy  [facility] for tasks assessed/performed       Past Medical History:  Diagnosis Date  . Acid reflux   . Arthritis   . HNP (herniated nucleus pulposus), lumbar   . Hypercholesteremia   . Hypertension   . Osteoporosis   . Sinus congestion     Past Surgical History:  Procedure Laterality Date  . CATARACT EXTRACTION    . COLONOSCOPY    . LUMBAR LAMINECTOMY/DECOMPRESSION MICRODISCECTOMY Left 05/01/2017   Procedure: Microdiscectomy - Lumbar four-five  - left;  Surgeon: Kary Kos, MD;  Location: [facility];  Service: Neurosurgery;  Laterality: Left;  . Right snkle repair      There were no vitals filed for this visit.  Subjective Assessment - 08/29/17 0856    Subjective  Reports that her back is good today but MD did not do anything for ankle other than support hose and diet as well as elevation.    Pertinent History  osteoporosis, R ankle fx from fall 2014    How long can you stand comfortably?  5 min (leg gives out)    How long can you walk comfortably?  2-3 min    Diagnostic tests  MRI showed L4/5 herniated disc    Patient Stated Goals  get rid of pain    Currently in Pain?  Other (Comment) No pain assessment provided by patient         [facility] PT Assessment - 08/29/17 0001      Assessment   Medical Diagnosis  closed compression fx L4 vertrebra    Onset Date/Surgical Date  05/01/17    Next MD Visit  11/12/2017      Precautions   Precautions  Back    Precaution Comments  no bending, lifting or twisting                   [facility] Adult PT Treatment/Exercise - 08/29/17 0001      Lumbar Exercises: Aerobic   Nustep  L5 x20 min      Lumbar Exercises: Supine   Clam  20 reps red theraband    Bent Knee Raise  Other (comment) 2x20 reps of bicycle kicks    Bridge with Ball Squeeze  20 reps;3 seconds    Straight Leg Raise  20 reps      Modalities   Modalities  Electrical Stimulation;Moist Heat      Moist Heat Therapy   Number Minutes Moist Heat  15 Minutes    Moist Heat Location  Lumbar Spine      Electrical Stimulation   Electrical Stimulation Location  L low back    Electrical Stimulation Action  Pre-Mod    Electrical Stimulation Parameters  80-150 hz x15 min    Electrical Stimulation Goals  Pain  PT Short Term Goals - 08/13/17 0911      PT SHORT TERM GOAL #1   Title  I with initial HEP    Time  4    Period  Weeks    Status  Achieved      PT SHORT TERM GOAL #2   Title  Patient able to ambulate community distances safely with quad cane.    Time  4    Period  Weeks    Status  On-going FWW used as of 08/13/2017        PT Long Term Goals - 07/02/17 0950      PT LONG TERM GOAL #1   Title  I with advanced HEP including osteoporosis decompression exercises.    Time  8    Period  Weeks    Status  On-going      PT LONG TERM GOAL #2   Title  Patient able to be on her feet for ADLS for 30 min or more with pain 2/10 or less.    Time  8    Period  Weeks    Status  On-going      PT LONG TERM GOAL #3   Title  Patient able to ambulate community distances safely without AD.    Time  8    Period  Weeks    Status  On-going            Plan - 08/29/17 0944    Clinical Impression Statement  Patient tolerated today's treatment well as she reported minimal LBP and LLE pain with ambulation. Patient ambulated into therapy clinic  with [facility] and no lumbar corset donned. More supine core strengthening completed as patient more limited with standing exercises. Patient reported only fatigue and muscle discomfort with SLR. Normal modalities response noted following removal of the modalities.    Rehab Potential  Excellent    PT Frequency  2x / week    PT Duration  8 weeks    PT Treatment/Interventions  ADLs/Self Care Home Management;Cryotherapy;Electrical Stimulation;Moist Heat;Ultrasound;Therapeutic exercise;Neuromuscular re-education;Patient/family education    PT Next Visit Plan  Continue with progression as symptoms dictate per protocol.    PT Home Exercise Plan  TA contraction, bridging     Consulted and Agree with Plan of Care  Patient       Patient will benefit from skilled therapeutic intervention in order to improve the following deficits and impairments:  Decreased range of motion, Pain, Decreased activity tolerance, Impaired flexibility, Postural dysfunction, Decreased strength  Visit Diagnosis: Acute midline low back pain, with sciatica presence unspecified  Muscle weakness (generalized)  Abnormal posture     Problem List Patient Active Problem List   Diagnosis Date Noted  . HNP (herniated nucleus pulposus), lumbar 05/01/2017  . BMI 29.0-29.9,adult 12/21/2014  . GERD (gastroesophageal reflux disease)   . Hyperlipidemia with target LDL less than 100   . Hypertension   . Osteoporosis     Standley Brooking, PTA 08/29/2017, 9:58 AM  [facility] [address] [place], [place], [address] Phone: [phone]   Fax:  [phone]  Name: Cheryl Lee MRN: [id number] Date of Birth: 04/18/1946

## 2017-09-03 ENCOUNTER — Ambulatory Visit: Payer: Medicare Other | Admitting: Physical Therapy

## 2017-09-03 ENCOUNTER — Encounter: Payer: Self-pay | Admitting: Physical Therapy

## 2017-09-03 DIAGNOSIS — R293 Abnormal posture: Secondary | ICD-10-CM | POA: Diagnosis not present

## 2017-09-03 DIAGNOSIS — M6281 Muscle weakness (generalized): Secondary | ICD-10-CM

## 2017-09-03 DIAGNOSIS — M545 Low back pain: Secondary | ICD-10-CM

## 2017-09-03 NOTE — Therapy (Signed)
[facility] [place], [place], [address] Phone: [phone]   Fax:  [phone]  Physical Therapy Treatment  Patient Details  Name: Cheryl Lee MRN: [id number] Date of Birth: Jun 06, 1945 Referring Provider: Dr. Kary Kos   Encounter Date: 09/03/2017  PT End of Session - 09/03/17 1021    Visit Number  25    Number of Visits  28    Date for PT Re-Evaluation  09/10/17    PT Start Time  0901    PT Stop Time  0955    PT Time Calculation (min)  54 min    Activity Tolerance  Patient tolerated treatment well    Behavior During Therapy  [facility] for tasks assessed/performed       Past Medical History:  Diagnosis Date  . Acid reflux   . Arthritis   . HNP (herniated nucleus pulposus), lumbar   . Hypercholesteremia   . Hypertension   . Osteoporosis   . Sinus congestion     Past Surgical History:  Procedure Laterality Date  . CATARACT EXTRACTION    . COLONOSCOPY    . LUMBAR LAMINECTOMY/DECOMPRESSION MICRODISCECTOMY Left 05/01/2017   Procedure: Microdiscectomy - Lumbar four-five  - left;  Surgeon: Kary Kos, MD;  Location: [place];  Service: Neurosurgery;  Laterality: Left;  . Right snkle repair      There were no vitals filed for this visit.  Subjective Assessment - 09/03/17 0904    Subjective  Reports that she wanted to use her walker today but did better with her cane. Reports pain in L groin area and L hip region today. And is sensitive in L mid back region.    Pertinent History  osteoporosis, R ankle fx from fall 2014    How long can you stand comfortably?  5 min (leg gives out)    How long can you walk comfortably?  2-3 min    Diagnostic tests  MRI showed L4/5 herniated disc    Patient Stated Goals  get rid of pain    Currently in Pain?  Yes    Pain Score  2     Pain Location  Back    Pain Orientation  Left;Lower    Pain Descriptors / Indicators  Discomfort    Pain Type  Acute pain    Pain Onset  More than a  month ago    Pain Frequency  Intermittent         [facility] PT Assessment - 09/03/17 0001      Assessment   Medical Diagnosis  closed compression fx L4 vertrebra    Onset Date/Surgical Date  05/01/17    Next MD Visit  11/12/2017      Precautions   Precautions  Back    Precaution Comments  no bending, lifting or twisting                   [facility] Adult PT Treatment/Exercise - 09/03/17 0001      Lumbar Exercises: Aerobic   Nustep  L5 x18 min      Lumbar Exercises: Seated   Other Seated Lumbar Exercises  Hor. abduct and rows yellow theraband x20 reps each      Lumbar Exercises: Supine   Bridge with clamshell  20 reps;2 seconds yellow theraband    Straight Leg Raise  20 reps      Knee/Hip Exercises: Standing   Forward Step Up  Both;15 reps;Hand Hold: 2;Step Height: 4"  Modalities   Modalities  Electrical Stimulation;Moist Heat      Moist Heat Therapy   Number Minutes Moist Heat  15 Minutes    Moist Heat Location  Lumbar Spine      Electrical Stimulation   Electrical Stimulation Location  L low back    Electrical Stimulation Action  Pre-Mod    Electrical Stimulation Parameters  80-150 hz x15 min    Electrical Stimulation Goals  Pain               PT Short Term Goals - 08/13/17 0911      PT SHORT TERM GOAL #1   Title  I with initial HEP    Time  4    Period  Weeks    Status  Achieved      PT SHORT TERM GOAL #2   Title  Patient able to ambulate community distances safely with quad cane.    Time  4    Period  Weeks    Status  On-going FWW used as of 08/13/2017        PT Long Term Goals - 07/02/17 0950      PT LONG TERM GOAL #1   Title  I with advanced HEP including osteoporosis decompression exercises.    Time  8    Period  Weeks    Status  On-going      PT LONG TERM GOAL #2   Title  Patient able to be on her feet for ADLS for 30 min or more with pain 2/10 or less.    Time  8    Period  Weeks    Status  On-going      PT LONG TERM  GOAL #3   Title  Patient able to ambulate community distances safely without AD.    Time  8    Period  Weeks    Status  On-going            Plan - 09/03/17 1022    Clinical Impression Statement  Patient tolerated today's treatment well and able to complete standing exercises fairly well. Patient still fatigues with LE strengthening exercises with core activation. Patient able to stand fairly erect with [facility] today in clinic. Increased L QL tightness palpated again today. Normal modalities response noted following removal of the modalities.    Rehab Potential  Excellent    PT Frequency  2x / week    PT Duration  8 weeks    PT Treatment/Interventions  ADLs/Self Care Home Management;Cryotherapy;Electrical Stimulation;Moist Heat;Ultrasound;Therapeutic exercise;Neuromuscular re-education;Patient/family education    PT Next Visit Plan  Continue with progression as symptoms dictate per protocol. Send recert for DN to Dr. Saintclair Halsted.    PT Home Exercise Plan  TA contraction, bridging     Consulted and Agree with Plan of Care  Patient       Patient will benefit from skilled therapeutic intervention in order to improve the following deficits and impairments:  Decreased range of motion, Pain, Decreased activity tolerance, Impaired flexibility, Postural dysfunction, Decreased strength  Visit Diagnosis: Acute midline low back pain, with sciatica presence unspecified  Muscle weakness (generalized)  Abnormal posture     Problem List Patient Active Problem List   Diagnosis Date Noted  . HNP (herniated nucleus pulposus), lumbar 05/01/2017  . BMI 29.0-29.9,adult 12/21/2014  . GERD (gastroesophageal reflux disease)   . Hyperlipidemia with target LDL less than 100   . Hypertension   . Osteoporosis     Elizebeth Koller  Ron Agee 09/03/2017, 10:42 AM  [facility] [address] [place], [place], [address] Phone: [phone]   Fax:  [phone]  Name:  Cheryl Lee MRN: [id number] Date of Birth: [birth year]

## 2017-09-05 ENCOUNTER — Encounter: Payer: Self-pay | Admitting: Physical Therapy

## 2017-09-05 ENCOUNTER — Ambulatory Visit: Payer: Medicare Other | Admitting: Physical Therapy

## 2017-09-05 DIAGNOSIS — R293 Abnormal posture: Secondary | ICD-10-CM

## 2017-09-05 DIAGNOSIS — M6281 Muscle weakness (generalized): Secondary | ICD-10-CM

## 2017-09-05 DIAGNOSIS — M545 Low back pain: Secondary | ICD-10-CM

## 2017-09-05 NOTE — Therapy (Signed)
[facility] [place], [place], [address] Phone: [phone]   Fax:  [phone]  Physical Therapy Treatment  Patient Details  Name: Cheryl Lee MRN: [id number] Date of Birth: 07/29/1945 Referring Provider: Dr. Kary Kos   Encounter Date: 09/05/2017  PT End of Session - 09/05/17 0856    Visit Number  26    Number of Visits  28    Date for PT Re-Evaluation  09/10/17    PT Start Time  0900    PT Stop Time  0949    PT Time Calculation (min)  49 min    Activity Tolerance  Patient tolerated treatment well    Behavior During Therapy  [facility] for tasks assessed/performed       Past Medical History:  Diagnosis Date  . Acid reflux   . Arthritis   . HNP (herniated nucleus pulposus), lumbar   . Hypercholesteremia   . Hypertension   . Osteoporosis   . Sinus congestion     Past Surgical History:  Procedure Laterality Date  . CATARACT EXTRACTION    . COLONOSCOPY    . LUMBAR LAMINECTOMY/DECOMPRESSION MICRODISCECTOMY Left 05/01/2017   Procedure: Microdiscectomy - Lumbar four-five  - left;  Surgeon: Kary Kos, MD;  Location: [place];  Service: Neurosurgery;  Laterality: Left;  . Right snkle repair      There were no vitals filed for this visit.  Subjective Assessment - 09/05/17 0856    Subjective  Reports that at times she can walk without her cane and does not have LLE pain. Usually needs the cane mostly in the mornings.    Pertinent History  osteoporosis, R ankle fx from fall 2014    How long can you stand comfortably?  5 min (leg gives out)    How long can you walk comfortably?  2-3 min    Diagnostic tests  MRI showed L4/5 herniated disc    Patient Stated Goals  get rid of pain    Currently in Pain?  No/denies         [facility] PT Assessment - 09/05/17 0001      Assessment   Medical Diagnosis  closed compression fx L4 vertrebra    Onset Date/Surgical Date  05/01/17    Next MD Visit  11/12/2017      Precautions    Precautions  Back    Precaution Comments  no bending, lifting or twisting                   [facility] Adult PT Treatment/Exercise - 09/05/17 0001      Lumbar Exercises: Aerobic   Nustep  L5 x16 min      Lumbar Exercises: Supine   Clam  20 reps red theraband    Bridge with clamshell  20 reps;2 seconds red theraband    Straight Leg Raise  20 reps      Knee/Hip Exercises: Standing   Forward Step Up  Both;20 reps;Hand Hold: 2;Step Height: 6"      Modalities   Modalities  Electrical Stimulation;Moist Heat      Moist Heat Therapy   Number Minutes Moist Heat  15 Minutes    Moist Heat Location  Lumbar Spine      Electrical Stimulation   Electrical Stimulation Location  B low back    Electrical Stimulation Action  IFC    Electrical Stimulation Parameters  80-150 hz x15 min    Electrical Stimulation Goals  Pain  Manual Therapy   Manual Therapy  Soft tissue mobilization    Soft tissue mobilization  STW to L QL, lumbar paraspinals to reduce muscle tightness and pain               PT Short Term Goals - 08/13/17 0911      PT SHORT TERM GOAL #1   Title  I with initial HEP    Time  4    Period  Weeks    Status  Achieved      PT SHORT TERM GOAL #2   Title  Patient able to ambulate community distances safely with quad cane.    Time  4    Period  Weeks    Status  On-going FWW used as of 08/13/2017        PT Long Term Goals - 07/02/17 0950      PT LONG TERM GOAL #1   Title  I with advanced HEP including osteoporosis decompression exercises.    Time  8    Period  Weeks    Status  On-going      PT LONG TERM GOAL #2   Title  Patient able to be on her feet for ADLS for 30 min or more with pain 2/10 or less.    Time  8    Period  Weeks    Status  On-going      PT LONG TERM GOAL #3   Title  Patient able to ambulate community distances safely without AD.    Time  8    Period  Weeks    Status  On-going            Plan - 09/05/17 0934    Clinical  Impression Statement  Patient tolerated today's treatment better today with less LLE pain reports. Patient able to ambulate few steps without AD with reports of no LLE pain. Patient still reported LLE fatigue with forward step ups. Patient guided through supine core and LE strengthening with VCs for slow, controlled movements. Patient still sensitive with manual therapy to QL region although increased tightness from previous appointments not palpable today. Normal modalities response noted following removal of the modalities.    Rehab Potential  Excellent    PT Frequency  2x / week    PT Duration  8 weeks    PT Treatment/Interventions  ADLs/Self Care Home Management;Cryotherapy;Electrical Stimulation;Moist Heat;Ultrasound;Therapeutic exercise;Neuromuscular re-education;Patient/family education    PT Next Visit Plan  Continue with progression as symptoms dictate per protocol. Send recert for DN to Dr. Saintclair Halsted.    PT Home Exercise Plan  TA contraction, bridging     Consulted and Agree with Plan of Care  Patient       Patient will benefit from skilled therapeutic intervention in order to improve the following deficits and impairments:  Decreased range of motion, Pain, Decreased activity tolerance, Impaired flexibility, Postural dysfunction, Decreased strength  Visit Diagnosis: Acute midline low back pain, with sciatica presence unspecified  Muscle weakness (generalized)  Abnormal posture     Problem List Patient Active Problem List   Diagnosis Date Noted  . HNP (herniated nucleus pulposus), lumbar 05/01/2017  . BMI 29.0-29.9,adult 12/21/2014  . GERD (gastroesophageal reflux disease)   . Hyperlipidemia with target LDL less than 100   . Hypertension   . Osteoporosis     Standley Brooking, PTA 09/05/2017, 9:54 AM  [facility] [address] [place], [place], [address] Phone: [phone]   Fax:  [phone]  Name: Cheryl Lee MRN:  [id number] Date of Birth: 1945-11-05

## 2017-09-06 NOTE — Progress Notes (Unsigned)
Left message for patient to return my call to discuss scheduling appointment for Prolia injection. Medication will need to be ordered from the [facility] specialty pharmacy.

## 2017-09-06 NOTE — Progress Notes (Unsigned)
Insurance verification received. Patient does not have a prior authorization and the copay is 0% if purchased and billed through medical office.

## 2017-09-10 ENCOUNTER — Ambulatory Visit: Payer: Medicare Other | Admitting: Physical Therapy

## 2017-09-10 ENCOUNTER — Telehealth: Payer: Self-pay | Admitting: Nurse Practitioner

## 2017-09-10 ENCOUNTER — Encounter: Payer: Self-pay | Admitting: Physical Therapy

## 2017-09-10 DIAGNOSIS — R293 Abnormal posture: Secondary | ICD-10-CM

## 2017-09-10 DIAGNOSIS — M6281 Muscle weakness (generalized): Secondary | ICD-10-CM

## 2017-09-10 DIAGNOSIS — M545 Low back pain: Secondary | ICD-10-CM | POA: Diagnosis not present

## 2017-09-10 NOTE — Therapy (Signed)
[facility] [place], [place], [address] Phone: [phone]   Fax:  [phone]  Physical Therapy Treatment  Patient Details  Name: Cheryl Lee MRN: [id number] Date of Birth: 1946/04/07 Referring Provider: Dr. Kary Kos   Encounter Date: 09/10/2017  PT End of Session - 09/10/17 0908    Visit Number  27    Number of Visits  28    Date for PT Re-Evaluation  09/10/17    PT Start Time  0901    PT Stop Time  0948    PT Time Calculation (min)  47 min    Activity Tolerance  Patient tolerated treatment well    Behavior During Therapy  [facility] for tasks assessed/performed       Past Medical History:  Diagnosis Date  . Acid reflux   . Arthritis   . HNP (herniated nucleus pulposus), lumbar   . Hypercholesteremia   . Hypertension   . Osteoporosis   . Sinus congestion     Past Surgical History:  Procedure Laterality Date  . CATARACT EXTRACTION    . COLONOSCOPY    . LUMBAR LAMINECTOMY/DECOMPRESSION MICRODISCECTOMY Left 05/01/2017   Procedure: Microdiscectomy - Lumbar four-five  - left;  Surgeon: Kary Kos, MD;  Location: [place];  Service: Neurosurgery;  Laterality: Left;  . Right snkle repair      There were no vitals filed for this visit.  Subjective Assessment - 09/10/17 0904    Subjective  Reports she had some pain in L thigh this morning but took some tylenol. Curious regarding DN.    Pertinent History  osteoporosis, R ankle fx from fall 2014    How long can you stand comfortably?  5 min (leg gives out)    How long can you walk comfortably?  2-3 min    Diagnostic tests  MRI showed L4/5 herniated disc    Patient Stated Goals  get rid of pain    Currently in Pain?  Yes    Pain Score  1     Pain Location  Leg    Pain Orientation  Left;Upper    Pain Descriptors / Indicators  Discomfort    Pain Type  Acute pain    Pain Onset  More than a month ago    Pain Frequency  Intermittent         [facility] PT Assessment -  09/10/17 0001      Assessment   Medical Diagnosis  closed compression fx L4 vertrebra    Onset Date/Surgical Date  05/01/17    Next MD Visit  11/12/2017      Precautions   Precautions  Back    Precaution Comments  no bending, lifting or twisting                   [facility] Adult PT Treatment/Exercise - 09/10/17 0001      Lumbar Exercises: Aerobic   Nustep  L6 x15 min      Lumbar Exercises: Standing   Heel Raises  20 reps B toe raise x10 reps    Other Standing Lumbar Exercises  B hip abduction x10 reps      Lumbar Exercises: Seated   Long Arc Quad on Chair  Strengthening;Both;20 reps;Weights    LAQ on Chair Weights (lbs)  4    Other Seated Lumbar Exercises  B row red theraband x20 reps      Lumbar Exercises: Supine   Bridge with clamshell  20 reps;3 seconds red  theraband    Straight Leg Raise  20 reps      Modalities   Modalities  Electrical Stimulation;Moist Heat      Moist Heat Therapy   Number Minutes Moist Heat  15 Minutes    Moist Heat Location  Lumbar Spine      Electrical Stimulation   Electrical Stimulation Location  B low back    Electrical Stimulation Action  IFC    Electrical Stimulation Parameters  80-150 hz x15 min    Electrical Stimulation Goals  Pain               PT Short Term Goals - 09/10/17 3893      PT SHORT TERM GOAL #1   Title  I with initial HEP    Time  4    Period  Weeks    Status  Achieved      PT SHORT TERM GOAL #2   Title  Patient able to ambulate community distances safely with quad cane.    Time  4    Period  Weeks    Status  Partially Met Limited to 5 minutes in standing regardless of AD per patient report 09/10/2017        PT Long Term Goals - 07/02/17 0950      PT LONG TERM GOAL #1   Title  I with advanced HEP including osteoporosis decompression exercises.    Time  8    Period  Weeks    Status  On-going      PT LONG TERM GOAL #2   Title  Patient able to be on her feet for ADLS for 30 min or more with  pain 2/10 or less.    Time  8    Period  Weeks    Status  On-going      PT LONG TERM GOAL #3   Title  Patient able to ambulate community distances safely without AD.    Time  8    Period  Weeks    Status  On-going            Plan - 09/10/17 0954    Clinical Impression Statement  Patient tolerated today's treatment fairly well although patient somewhat limited with standing exercises secondary to pain. VC and tactile cues required for standing exercises to ensure proper technique. Increased L QL tightness palpated even in standing upon comparison with R QL. Patient reported discomfort in anterior ankle secondary to pain and inflammation that presents. Muscle weakness and fatigue noted in B quad with SLR with core activation. Core activation VCs provided throughout treatment. Normal modalities response noted following removal of the modalities.    Rehab Potential  Excellent    PT Frequency  2x / week    PT Duration  8 weeks    PT Treatment/Interventions  ADLs/Self Care Home Management;Cryotherapy;Electrical Stimulation;Moist Heat;Ultrasound;Therapeutic exercise;Neuromuscular re-education;Patient/family education    PT Next Visit Plan  Continue with progression as symptoms dictate per protocol. Send recert for DN to Dr. Saintclair Halsted.    PT Home Exercise Plan  TA contraction, bridging     Consulted and Agree with Plan of Care  Patient       Patient will benefit from skilled therapeutic intervention in order to improve the following deficits and impairments:  Decreased range of motion, Pain, Decreased activity tolerance, Impaired flexibility, Postural dysfunction, Decreased strength  Visit Diagnosis: Acute midline low back pain, with sciatica presence unspecified  Muscle weakness (generalized)  Abnormal posture  Problem List Patient Active Problem List   Diagnosis Date Noted  . HNP (herniated nucleus pulposus), lumbar 05/01/2017  . BMI 29.0-29.9,adult 12/21/2014  . GERD  (gastroesophageal reflux disease)   . Hyperlipidemia with target LDL less than 100   . Hypertension   . Osteoporosis     Standley Brooking, PTA 09/10/2017, 10:25 AM  [facility] [address] [place], [place], [address] Phone: [phone]   Fax:  [phone]  Name: Cheryl Lee MRN: [id number] Date of Birth: 12/16/1945

## 2017-09-11 NOTE — Telephone Encounter (Signed)
Patient does not want to start Prolia. She has seen advertisements where it can cause hip and spine fractures if discontinued. This concerns her because she has had surgery on her spine and continues to have some discomfort with that. Explained the risks and benefits of Prolia.  She has been taking alendronate with no problems and would like to continue that. Advised that at some point she will need to take a break from Alendronate since she has been taking it for years. Her dexa scan results improved slightly this year from the previous and her t-score is now -2.1 and is considered osteopenic.   Will forward to her PCP Cheryl Pretty, FNP as an update.

## 2017-09-12 ENCOUNTER — Encounter: Payer: Self-pay | Admitting: Physical Therapy

## 2017-09-12 ENCOUNTER — Ambulatory Visit: Payer: Medicare Other | Attending: Neurosurgery | Admitting: Physical Therapy

## 2017-09-12 DIAGNOSIS — M6281 Muscle weakness (generalized): Secondary | ICD-10-CM | POA: Diagnosis not present

## 2017-09-12 DIAGNOSIS — R293 Abnormal posture: Secondary | ICD-10-CM

## 2017-09-12 DIAGNOSIS — M545 Low back pain: Secondary | ICD-10-CM | POA: Insufficient documentation

## 2017-09-12 NOTE — Therapy (Addendum)
[facility] [place], [place], [address] Phone: [phone]   Fax:  [phone]  Physical Therapy Treatment  Patient Details  Name: Cheryl Lee MRN: [id number] Date of Birth: 06-08-1945 Referring Provider: Dr. Kary Kos   Encounter Date: 09/12/2017  PT End of Session - 09/12/17 0900    Visit Number  28    Number of Visits  32    Date for PT Re-Evaluation  10/03/17    PT Start Time  0903    PT Stop Time  0949    PT Time Calculation (min)  46 min    Activity Tolerance  Patient tolerated treatment well    Behavior During Therapy  [facility] for tasks assessed/performed       Past Medical History:  Diagnosis Date  . Acid reflux   . Arthritis   . HNP (herniated nucleus pulposus), lumbar   . Hypercholesteremia   . Hypertension   . Osteoporosis   . Sinus congestion     Past Surgical History:  Procedure Laterality Date  . CATARACT EXTRACTION    . COLONOSCOPY    . LUMBAR LAMINECTOMY/DECOMPRESSION MICRODISCECTOMY Left 05/01/2017   Procedure: Microdiscectomy - Lumbar four-five  - left;  Surgeon: Kary Kos, MD;  Location: [place];  Service: Neurosurgery;  Laterality: Left;  . Right snkle repair      There were no vitals filed for this visit.  Subjective Assessment - 09/12/17 0900    Subjective  Reports that she tried to carry the cane when walking but limited to 2-5 minutes still. Reports that her R ankle was burning and stinging along lateral aspect last night.    Pertinent History  osteoporosis, R ankle fx from fall 2014    How long can you stand comfortably?  5 min (leg gives out)    How long can you walk comfortably?  2-3 min    Diagnostic tests  MRI showed L4/5 herniated disc    Patient Stated Goals  get rid of pain    Currently in Pain?  No/denies         [facility] PT Assessment - 09/12/17 0001      Assessment   Medical Diagnosis  closed compression fx L4 vertrebra    Onset Date/Surgical Date  05/01/17    Next  MD Visit  11/12/2017      Precautions   Precautions  Back    Precaution Comments  no bending, lifting or twisting                   [facility] Adult PT Treatment/Exercise - 09/12/17 0001      Lumbar Exercises: Aerobic   Nustep  L6 x15 min      Lumbar Exercises: Standing   Heel Raises  20 reps B toe raise x20 reps    Forward Lunge  10 reps    Other Standing Lumbar Exercises  B hip abduction x20 reps      Lumbar Exercises: Supine   Bridge with Ball Squeeze  20 reps;4 seconds    Straight Leg Raise  20 reps      Lumbar Exercises: Sidelying   Clam  Both;20 reps      Modalities   Modalities  Electrical Stimulation;Moist Heat      Moist Heat Therapy   Number Minutes Moist Heat  15 Minutes    Moist Heat Location  Lumbar Spine      Electrical Stimulation   Electrical Stimulation Location  B low back  Electrical Stimulation Action  Pre-Mod    Electrical Stimulation Parameters  80-150 hz x15 min    Electrical Stimulation Goals  Pain               PT Short Term Goals - 09/10/17 0916      PT SHORT TERM GOAL #1   Title  I with initial HEP    Time  4    Period  Weeks    Status  Achieved      PT SHORT TERM GOAL #2   Title  Patient able to ambulate community distances safely with quad cane.    Time  4    Period  Weeks    Status  Partially Met Limited to 5 minutes in standing regardless of AD per patient report 09/10/2017        PT Long Term Goals - 07/02/17 0950      PT LONG TERM GOAL #1   Title  I with advanced HEP including osteoporosis decompression exercises.    Time  8    Period  Weeks    Status  On-going      PT LONG TERM GOAL #2   Title  Patient able to be on her feet for ADLS for 30 min or more with pain 2/10 or less.    Time  8    Period  Weeks    Status  On-going      PT LONG TERM GOAL #3   Title  Patient able to ambulate community distances safely without AD.    Time  8    Period  Weeks    Status  On-going            Plan -  09/12/17 3086    Clinical Impression Statement  Patient tolerated today's treatment better overall as she tolerated more standing exercises without report of vastly increased LBP. VCs for core activation as well as slow and controlled speed required for today's treatment. Patient still ambulates with SBQC but lightly. Normal modalities response noted following removal of the modalities. Patient observed as ambulating with more R trunk lean which may attribute to L QL tightness.    Rehab Potential  Excellent    PT Frequency  2x / week    PT Duration  8 weeks    PT Treatment/Interventions  ADLs/Self Care Home Management;Cryotherapy;Electrical Stimulation;Moist Heat;Ultrasound;Therapeutic exercise;Neuromuscular re-education;Patient/family education    PT Next Visit Plan  Continue with progression as symptoms dictate per protocol. Send recert for DN to Dr. Saintclair Halsted.    PT Home Exercise Plan  TA contraction, bridging     Consulted and Agree with Plan of Care  Patient       Patient will benefit from skilled therapeutic intervention in order to improve the following deficits and impairments:  Decreased range of motion, Pain, Decreased activity tolerance, Impaired flexibility, Postural dysfunction, Decreased strength  Visit Diagnosis: Acute midline low back pain, with sciatica presence unspecified  Muscle weakness (generalized)  Abnormal posture     Problem List Patient Active Problem List   Diagnosis Date Noted  . HNP (herniated nucleus pulposus), lumbar 05/01/2017  . BMI 29.0-29.9,adult 12/21/2014  . GERD (gastroesophageal reflux disease)   . Hyperlipidemia with target LDL less than 100   . Hypertension   . Osteoporosis     Claudie Revering, PTA 09/12/2017, 9:48 PM  [facility] [address] [place], [place], [address] Phone: [phone]   Fax:  [phone]  Name: Cheryl Lee  MRN: [id number] Date of Birth: 1945/12/07

## 2017-09-12 NOTE — Addendum Note (Signed)
Addended by: Gabriela Eves on: 09/12/2017 09:55 PM   Modules accepted: Orders

## 2017-09-17 ENCOUNTER — Ambulatory Visit: Payer: Medicare Other | Admitting: Physical Therapy

## 2017-09-17 ENCOUNTER — Encounter: Payer: Self-pay | Admitting: Physical Therapy

## 2017-09-17 DIAGNOSIS — M545 Low back pain: Secondary | ICD-10-CM | POA: Diagnosis not present

## 2017-09-17 DIAGNOSIS — M6281 Muscle weakness (generalized): Secondary | ICD-10-CM | POA: Diagnosis not present

## 2017-09-17 DIAGNOSIS — R293 Abnormal posture: Secondary | ICD-10-CM

## 2017-09-17 NOTE — Therapy (Signed)
[facility] [place], [place], [address] Phone: [phone]   Fax:  [phone]  Physical Therapy Treatment  Patient Details  Name: Cheryl Lee MRN: [id number] Date of Birth: 30-Jan-1946 Referring Provider: Dr. Kary Kos   Encounter Date: 09/17/2017  PT End of Session - 09/17/17 0919    Visit Number  29    Number of Visits  32    Date for PT Re-Evaluation  10/03/17    PT Start Time  0903    PT Stop Time  0951    PT Time Calculation (min)  48 min    Activity Tolerance  Patient tolerated treatment well    Behavior During Therapy  [facility] for tasks assessed/performed       Past Medical History:  Diagnosis Date  . Acid reflux   . Arthritis   . HNP (herniated nucleus pulposus), lumbar   . Hypercholesteremia   . Hypertension   . Osteoporosis   . Sinus congestion     Past Surgical History:  Procedure Laterality Date  . CATARACT EXTRACTION    . COLONOSCOPY    . LUMBAR LAMINECTOMY/DECOMPRESSION MICRODISCECTOMY Left 05/01/2017   Procedure: Microdiscectomy - Lumbar four-five  - left;  Surgeon: Kary Kos, MD;  Location: [place];  Service: Neurosurgery;  Laterality: Left;  . Right snkle repair      There were no vitals filed for this visit.  Subjective Assessment - 09/17/17 0907    Subjective  Reports that she still can only walk without cane for short distances then has rest for a little bit. Reports that when she tries to walk without AD she has more pain the next day.    Pertinent History  osteoporosis, R ankle fx from fall 2014    How long can you stand comfortably?  5 min (leg gives out)    How long can you walk comfortably?  2-3 min    Diagnostic tests  MRI showed L4/5 herniated disc    Patient Stated Goals  get rid of pain    Currently in Pain?  Yes    Pain Score  2  when walking.    Pain Location  Leg    Pain Orientation  Left;Upper    Pain Descriptors / Indicators  Discomfort    Pain Type  Acute pain    Pain  Onset  More than a month ago    Pain Frequency  Intermittent         [facility] PT Assessment - 09/17/17 0001      Assessment   Medical Diagnosis  closed compression fx L4 vertrebra    Onset Date/Surgical Date  05/01/17    Next MD Visit  11/12/2017      Precautions   Precautions  Back    Precaution Comments  no bending, lifting or twisting                   [facility] Adult PT Treatment/Exercise - 09/17/17 0001      Lumbar Exercises: Aerobic   Nustep  L6 x16 min      Lumbar Exercises: Standing   Heel Raises  20 reps B toe raise x20 reps    Other Standing Lumbar Exercises  B hip abduction x20 reps      Lumbar Exercises: Seated   Sit to Stand  20 reps no UE assist      Lumbar Exercises: Supine   Bridge with Ball Squeeze  20 reps;4 seconds  Straight Leg Raise  20 reps      Lumbar Exercises: Sidelying   Clam  Both;20 reps      Modalities   Modalities  Electrical Stimulation;Moist Heat      Moist Heat Therapy   Number Minutes Moist Heat  15 Minutes    Moist Heat Location  Lumbar Spine      Electrical Stimulation   Electrical Stimulation Location  L low back    Electrical Stimulation Action  Pre-Mod    Electrical Stimulation Parameters  80-150 hz x15 min    Electrical Stimulation Goals  Pain               PT Short Term Goals - 09/10/17 1610      PT SHORT TERM GOAL #1   Title  I with initial HEP    Time  4    Period  Weeks    Status  Achieved      PT SHORT TERM GOAL #2   Title  Patient able to ambulate community distances safely with quad cane.    Time  4    Period  Weeks    Status  Partially Met Limited to 5 minutes in standing regardless of AD per patient report 09/10/2017        PT Long Term Goals - 07/02/17 0950      PT LONG TERM GOAL #1   Title  I with advanced HEP including osteoporosis decompression exercises.    Time  8    Period  Weeks    Status  On-going      PT LONG TERM GOAL #2   Title  Patient able to be on her feet for ADLS  for 30 min or more with pain 2/10 or less.    Time  8    Period  Weeks    Status  On-going      PT LONG TERM GOAL #3   Title  Patient able to ambulate community distances safely without AD.    Time  8    Period  Weeks    Status  On-going            Plan - 09/17/17 0946    Clinical Impression Statement  Patient tolerated today's treatment fairly well as she still reports LLE strength deficits and pain in LLE intermittantly. Patient guided through continued core/lumbar/LE strengthening with only complaint of lack of strength in LLE. Patient reported minimal stinging of the anterior R ankle during treatment which is the ankle that swells. Normal modalities response noted following removal of the modalities.     Rehab Potential  Excellent    PT Frequency  2x / week    PT Duration  8 weeks    PT Treatment/Interventions  ADLs/Self Care Home Management;Cryotherapy;Electrical Stimulation;Moist Heat;Ultrasound;Therapeutic exercise;Neuromuscular re-education;Patient/family education;Dry needling    PT Next Visit Plan  Continue with progression as symptoms dictate per protocol. Send recert for DN to Dr. Saintclair Halsted.    PT Home Exercise Plan  TA contraction, bridging     Consulted and Agree with Plan of Care  Patient       Patient will benefit from skilled therapeutic intervention in order to improve the following deficits and impairments:  Decreased range of motion, Pain, Decreased activity tolerance, Impaired flexibility, Postural dysfunction, Decreased strength  Visit Diagnosis: Acute midline low back pain, with sciatica presence unspecified  Muscle weakness (generalized)  Abnormal posture     Problem List Patient Active Problem List   Diagnosis  Date Noted  . HNP (herniated nucleus pulposus), lumbar 05/01/2017  . BMI 29.0-29.9,adult 12/21/2014  . GERD (gastroesophageal reflux disease)   . Hyperlipidemia with target LDL less than 100   . Hypertension   . Osteoporosis     Standley Brooking, PTA 09/17/2017, 9:55 AM  [facility] [address] [place], [place], [address] Phone: [phone]   Fax:  [phone]  Name: Cheryl Lee MRN: [id number] Date of Birth: 07-May-1946

## 2017-09-19 ENCOUNTER — Encounter: Payer: Medicare Other | Admitting: Physical Therapy

## 2017-09-20 ENCOUNTER — Ambulatory Visit: Payer: Medicare Other | Admitting: Physical Therapy

## 2017-09-20 DIAGNOSIS — M6281 Muscle weakness (generalized): Secondary | ICD-10-CM | POA: Diagnosis not present

## 2017-09-20 DIAGNOSIS — M545 Low back pain: Secondary | ICD-10-CM | POA: Diagnosis not present

## 2017-09-20 DIAGNOSIS — R293 Abnormal posture: Secondary | ICD-10-CM | POA: Diagnosis not present

## 2017-09-20 NOTE — Therapy (Signed)
[facility] [place], [place], [address] Phone: [phone]   Fax:  [phone]  Physical Therapy Treatment  Patient Details  Name: Cheryl Lee MRN: [id number] Date of Birth: Feb 15, 1946 Referring Provider: Dr. Kary Kos   Encounter Date: 09/20/2017  PT End of Session - 09/20/17 0903    Visit Number  30    Number of Visits  32    Date for PT Re-Evaluation  10/03/17    PT Start Time  0900    PT Stop Time  0959    PT Time Calculation (min)  59 min    Activity Tolerance  Patient tolerated treatment well    Behavior During Therapy  [facility] for tasks assessed/performed       Past Medical History:  Diagnosis Date  . Acid reflux   . Arthritis   . HNP (herniated nucleus pulposus), lumbar   . Hypercholesteremia   . Hypertension   . Osteoporosis   . Sinus congestion     Past Surgical History:  Procedure Laterality Date  . CATARACT EXTRACTION    . COLONOSCOPY    . LUMBAR LAMINECTOMY/DECOMPRESSION MICRODISCECTOMY Left 05/01/2017   Procedure: Microdiscectomy - Lumbar four-five  - left;  Surgeon: Kary Kos, MD;  Location: [place];  Service: Neurosurgery;  Laterality: Left;  . Right snkle repair      There were no vitals filed for this visit.  Subjective Assessment - 09/20/17 0903    Subjective  Patient reports pain into L thigh of 2/10 this morning. She feels her compression hose makes the pain worse.     Pertinent History  osteoporosis, R ankle fx from fall 2014    Patient Stated Goals  get rid of pain    Currently in Pain?  Yes    Pain Score  2           Progress Note Reporting Period 06/11/17 to 09/20/17 See note below for Objective Data and Assessment of Progress/Goals.                   [facility] Adult PT Treatment/Exercise - 09/20/17 0001      Self-Care   Self-Care  Other Self-Care Comments    Other Self-Care Comments   DN education and aftercare      Lumbar Exercises: Supine   Other Supine  Lumbar Exercises  Decompression exercises shoulder press, chin tuck, leg lengthener and leg press 3 sec holds x 5 each      Modalities   Modalities  Electrical Stimulation;Moist Heat      Moist Heat Therapy   Number Minutes Moist Heat  15 Minutes    Moist Heat Location  Lumbar Spine gluteals      Electrical Stimulation   Electrical Stimulation Location  Bil lumbar gluteals    Electrical Stimulation Action  IFC    Electrical Stimulation Parameters  80-150 Hz x 15 min to tolerance    Electrical Stimulation Goals  Pain      Manual Therapy   Manual Therapy  Soft tissue mobilization    Soft tissue mobilization  to Bil gluteals       Trigger Point Dry Needling - 09/20/17 1245    Consent Given?  Yes    Education Handout Provided  Yes    Muscles Treated Lower Body  Gluteus minimus;Gluteus maximus;Piriformis gluteues medius; Bil    Gluteus Maximus Response  Twitch response elicited;Palpable increased muscle length    Gluteus Minimus Response  Twitch response elicited;Palpable increased  muscle length    Piriformis Response  Twitch response elicited;Palpable increased muscle length           PT Education - 09/20/17 0935    Education provided  Yes    Education Details  HEP; DN education and aftercare    Person(s) Educated  Patient    Methods  Explanation;Demonstration;Verbal cues;Handout    Comprehension  Verbalized understanding;Returned demonstration       PT Short Term Goals - 09/10/17 0916      PT SHORT TERM GOAL #1   Title  I with initial HEP    Time  4    Period  Weeks    Status  Achieved      PT SHORT TERM GOAL #2   Title  Patient able to ambulate community distances safely with quad cane.    Time  4    Period  Weeks    Status  Partially Met Limited to 5 minutes in standing regardless of AD per patient report 09/10/2017        PT Long Term Goals - 09/20/17 0906      PT LONG TERM GOAL #1   Title  I with advanced HEP including osteoporosis decompression  exercises.    Time  8    Period  Weeks    Status  On-going      PT LONG TERM GOAL #2   Title  Patient able to be on her feet for ADLS for 30 min or more with pain 2/10 or less.    Baseline  5 min max    Time  8    Period  Weeks    Status  Not Met      PT LONG TERM GOAL #3   Title  Patient able to ambulate community distances safely without AD.    Time  8    Status  Not Met            Plan - 09/20/17 1244    Clinical Impression Statement  Patient was educated re: DN and gave consent. She responded very well to DN in bil gluteals and piriformis L>R. Patient is still limited with standing to 5 min and walking to short distances.     Rehab Potential  Excellent    PT Frequency  2x / week    PT Duration  8 weeks    PT Treatment/Interventions  ADLs/Self Care Home Management;Cryotherapy;Electrical Stimulation;Moist Heat;Ultrasound;Therapeutic exercise;Neuromuscular re-education;Patient/family education;Dry needling    PT Next Visit Plan  Assess DN and continue up to 2 more sessions as indicated. Review decompression TE.    PT Home Exercise Plan  TA contraction, bridging     Consulted and Agree with Plan of Care  Patient       Patient will benefit from skilled therapeutic intervention in order to improve the following deficits and impairments:  Decreased range of motion, Pain, Decreased activity tolerance, Impaired flexibility, Postural dysfunction, Decreased strength  Visit Diagnosis: Acute midline low back pain, with sciatica presence unspecified  Abnormal posture  Muscle weakness (generalized)     Problem List Patient Active Problem List   Diagnosis Date Noted  . HNP (herniated nucleus pulposus), lumbar 05/01/2017  . BMI 29.0-29.9,adult 12/21/2014  . GERD (gastroesophageal reflux disease)   . Hyperlipidemia with target LDL less than 100   . Hypertension   . Osteoporosis     Madelyn Flavors PT 09/20/2017, 12:53 PM  [facility] [address] [place], [place], [address]  Phone: [phone]   Fax:  [phone]  Name: Cheryl Lee MRN: [id number] Date of Birth: [birth year]

## 2017-09-20 NOTE — Patient Instructions (Addendum)
[facility]  DRY NEEDLING CONSENT FORM   Trigger point dry needling is a physical therapy approach to treat Myofascial Pain and Dysfunction.  Dry Needling (DN) is a valuable and effective way to deactivate myofascial trigger points (muscle knots). It is skilled intervention that uses a thin filiform needle to penetrate the skin and stimulate underlying myofascial trigger points, muscular, and connective tissues for the management of neuromusculoskeletal pain and movement impairments.  A local twitch response (LTR) will be elicited.  This can sometimes feel like a deep ache in the muscle during the procedure. Multiple trigger points in multiple muscles can be treated during each treatment.  No medication of any kind is injected.   As with any medical treatment and procedure, there are possible adverse events.  While significant adverse events are uncommon, they do sometimes occur and must be considered prior to giving consent.  1. Dry needling often causes a "post needling soreness".  There can be an increase in pain from a couple of hours to 2-3 days, followed by an improvement in the overall pain state. 2. Any time a needle is used there is a risk of infection.  However, we are using new, sterile, and disposable needles; infections are extremely rare. 3. There is a possibility that you may bleed or bruise.  You may feel tired and some nausea following treatment. 4. There is a rare possibility of a pneumothorax (air in the chest cavity). 5. Allergic reaction to nickel in the stainless steel needle. 6. If a nerve is touched, it may cause paresthesia (a prickling/shock sensation) which is usually brief, but may continue for a couple of days.  Following treatment stay hydrated.  Continue regular activities but not too vigorous initially after treatment for 24-48 hours. You may apply heat to sore muscles.  Dry Needling is best when combined with other physical therapy  interventions such as strengthening, stretching and other therapeutic modalities.     PLEASE ANSWER THE FOLLOWING QUESTIONS:  Do you have a lack of sensation?   Y/N  Do you have a phobia or fear of needles  Y/N  Are you pregnant?    Y/N If yes:  How many weeks? _____  Do you have any implanted devices?  Y/N If yes:  Pacemaker/Spinal Cord         Stimulator/Deep Brain         Stimulator/Insulin          Pump/Other: ____________ Do you have any implants?   Y/N If yes:      Do you take any blood thinners?   Y/N If yes: Coumadin          (Warfarin)/Other:  Do you have a bleeding disorder?   Y/N If yes: What kind:   Do you take any immunosuppressants?  Y/N If yes:   What kind:   Do you take anti-inflammatories?   Y/N If yes: What kind:  Have you ever been diagnosed with Scoliosis? Y/N  Have you had back surgery?    Y/N If yes:         Laminectomy/Fusion/Other:   I have read, or had read to me, the above.  I have had the opportunity to ask any questions.  All of my questions have been answered to my satisfaction and I understand the risks involved with dry needling.  I consent to examination and treatment at [facility], including dry needling, of any and all of my involved and affected   muscles.  RE-ALIGNMENT ROUTINE EXERCISES-OSTEOPROROSIS BASIC FOR POSTURAL CORRECTION   RE-ALIGNMENT Tips BENEFITS: 1.It helps to re-align the curves of the back and improve standing posture. 2.It allows the back muscles to rest and strengthen in preparation for more activity. FREQUENCY: Daily, even after weeks, months and years of more advanced exercises. START: 1.All exercises start in the same position: lying on the back, arms resting on the supporting surface, palms up and slightly away from the body, backs of hands down, knees bent, feet flat. 2.The head, neck, arms, and legs are supported according to specific instructions of your therapist. Copyright  [employer]. All  rights reserved.    1. Decompression Exercise: Basic.   Takes compression off the vertebral bodies; increases tolerance for lying on the back; helps relieve back pain   Lie on back on firm surface, knees bent, feet flat, arms turned up, out to sides (~35 degrees). Head neck and arms supported as necessary. Time _5-15__ minutes. Surface: floor     2. Shoulder Press  Strengthens upper back extensors and scapular retractors.   Press both shoulders down. Hold _2-3__ seconds. Repeat _3-5__ times. Surface: floor        3. Head Press With [place]  Strengthens neck extensors   Tuck chin SLIGHTLY toward chest, keep mouth closed. Feel weight on back of head. Increase weight by pressing head down. Hold _2-3__ seconds. Relax. Repeat 3-5___ times. Surface: floor     4. Leg Lengthener: stretches quadratus lumborum and hip flexors.  Strengthens quads and ankle dorsiflexors.  Leg Lengthener: Full    Straighten one leg. Pull toes AND forefoot toward knee, extend heel. Lengthen leg by pulling pelvis away from ribs. Hold __5_ seconds. Relax. Repeat 1 time. Re-bend knee. Do other leg. Each leg __5_ times. Surface: floor    Leg Lengthener / Leg Press Combo: Single Leg    Straighten one leg down to floor. Pull toes AND forefoot toward knee; extend heel. Lengthen leg by pulling pelvis away from ribs. Press leg down. DO NOT BEND KNEE. Hold _5__ seconds. Relax leg. Repeat exercise 1 time. Relax leg. Re-bend knee. Repeat with other leg. Do 5 times  Madelyn Flavors, PT 09/20/17 9:45 AM; [facility] [place], [place], [address] Phone: [phone]   Fax:  [phone]

## 2017-09-24 ENCOUNTER — Ambulatory Visit: Payer: Medicare Other | Admitting: Physical Therapy

## 2017-09-24 DIAGNOSIS — M6281 Muscle weakness (generalized): Secondary | ICD-10-CM | POA: Diagnosis not present

## 2017-09-24 DIAGNOSIS — R293 Abnormal posture: Secondary | ICD-10-CM | POA: Diagnosis not present

## 2017-09-24 DIAGNOSIS — M545 Low back pain: Secondary | ICD-10-CM

## 2017-09-24 NOTE — Therapy (Signed)
[facility] [place], [place], [address] Phone: [phone]   Fax:  [phone]  Physical Therapy Treatment  Patient Details  Name: Cheryl Lee MRN: [id number] Date of Birth: 1945/09/28 Referring Provider: Dr. Kary Kos   Encounter Date: 09/24/2017  PT End of Session - 09/24/17 0809    Visit Number  31    Number of Visits  32    Date for PT Re-Evaluation  10/03/17    PT Start Time  0815    PT Stop Time  0916    PT Time Calculation (min)  61 min    Activity Tolerance  Patient tolerated treatment well    Behavior During Therapy  [facility] for tasks assessed/performed       Past Medical History:  Diagnosis Date  . Acid reflux   . Arthritis   . HNP (herniated nucleus pulposus), lumbar   . Hypercholesteremia   . Hypertension   . Osteoporosis   . Sinus congestion     Past Surgical History:  Procedure Laterality Date  . CATARACT EXTRACTION    . COLONOSCOPY    . LUMBAR LAMINECTOMY/DECOMPRESSION MICRODISCECTOMY Left 05/01/2017   Procedure: Microdiscectomy - Lumbar four-five  - left;  Surgeon: Kary Kos, MD;  Location: [facility];  Service: Neurosurgery;  Laterality: Left;  . Right snkle repair      There were no vitals filed for this visit.  Subjective Assessment - 09/24/17 0819    Subjective  Patient states she is sore from the needling but feels it has helped some and would like to try it again.    Pertinent History  osteoporosis, R ankle fx from fall 2014    How long can you stand comfortably?  5 min (leg gives out)    How long can you walk comfortably?  2-3 min    Diagnostic tests  MRI showed L4/5 herniated disc    Patient Stated Goals  get rid of pain    Currently in Pain?  Yes    Pain Score  1     Pain Location  Leg    Pain Orientation  Left    Pain Descriptors / Indicators  Discomfort                       [facility] Adult PT Treatment/Exercise - 09/24/17 0001      Lumbar Exercises: Stretches   Other Lumbar Stretch Exercise  supine "C" stretch for bil QL      Lumbar Exercises: Aerobic   Nustep  L5 x 12 min      Lumbar Exercises: Supine   Other Supine Lumbar Exercises  Reviewed upper decompression TE; chin tuck and shoulder press; VCs to not use UTs.      Modalities   Modalities  Electrical Stimulation;Moist Heat      Moist Heat Therapy   Number Minutes Moist Heat  15 Minutes    Moist Heat Location  Lumbar Spine      Electrical Stimulation   Electrical Stimulation Location  Bil lumbar gluteals    Electrical Stimulation Action  IFC    Electrical Stimulation Parameters  80-150 Hz x 71mn    Electrical Stimulation Goals  Pain      Manual Therapy   Manual Therapy  Soft tissue mobilization    Soft tissue mobilization  [facility]      Trigger Point Dry Needling - 09/24/17 1150    Consent Given?  Yes    Muscles Treated Upper Body  Quadratus Lumborum Left    Muscles Treated Lower Body  Gluteus minimus;Piriformis Bil and glut med    Gluteus Minimus Response  Twitch response elicited;Palpable increased muscle length    Piriformis Response  Twitch response elicited;Palpable increased muscle length           PT Education - 09/24/17 1151    Education provided  Yes    Education Details  HeP C stretch    Person(s) Educated  Patient    Methods  Explanation;Demonstration    Comprehension  Verbalized understanding;Returned demonstration       PT Short Term Goals - 09/10/17 0916      PT SHORT TERM GOAL #1   Title  I with initial HEP    Time  4    Period  Weeks    Status  Achieved      PT SHORT TERM GOAL #2   Title  Patient able to ambulate community distances safely with quad cane.    Time  4    Period  Weeks    Status  Partially Met Limited to 5 minutes in standing regardless of AD per patient report 09/10/2017        PT Long Term Goals - 09/24/17 1154      PT LONG TERM GOAL #1   Title  I with advanced HEP including osteoporosis  decompression exercises.    Time  8    Period  Weeks    Status  Achieved      PT LONG TERM GOAL #2   Title  Patient able to be on her feet for ADLS for 30 min or more with pain 2/10 or less.    Baseline  5 min max    Time  8    Period  Weeks    Status  On-going      PT LONG TERM GOAL #3   Title  Patient able to ambulate community distances safely without AD.    Time  8    Period  Weeks    Status  On-going            Plan - 09/24/17 1152    Clinical Impression Statement  Patient reported some improvement after last session of DN. She had significantly less TPs in bil gluteals today and responded well to DN today. Bil QL's were tender with manual therapy and responded well to stretch in supine..    Rehab Potential  Excellent    PT Frequency  2x / week    PT Duration  8 weeks    PT Treatment/Interventions  ADLs/Self Care Home Management;Cryotherapy;Electrical Stimulation;Moist Heat;Ultrasound;Therapeutic exercise;Neuromuscular re-education;Patient/family education;Dry needling    PT Next Visit Plan  Assess goals, Continue with DN as indicated; D/C to HEP.    PT Home Exercise Plan  TA contraction, bridging     Consulted and Agree with Plan of Care  Patient       Patient will benefit from skilled therapeutic intervention in order to improve the following deficits and impairments:  Decreased range of motion, Pain, Decreased activity tolerance, Impaired flexibility, Postural dysfunction, Decreased strength  Visit Diagnosis: Acute midline low back pain, with sciatica presence unspecified  Abnormal posture     Problem List Patient Active Problem List   Diagnosis Date Noted  . HNP (herniated nucleus pulposus), lumbar 05/01/2017  . BMI 29.0-29.9,adult 12/21/2014  . GERD (gastroesophageal reflux disease)   . Hyperlipidemia with target LDL less than  100   . Hypertension   . Osteoporosis     Cheryl Lee PT 09/24/2017, 11:57 AM  [facility] [address] [place], [place], [address] Phone: [phone]   Fax:  [phone]  Name: Cheryl Lee MRN: [id number] Date of Birth: [birth year]

## 2017-09-26 ENCOUNTER — Encounter: Payer: Medicare Other | Admitting: Physical Therapy

## 2017-10-01 ENCOUNTER — Ambulatory Visit: Payer: Medicare Other | Admitting: Physical Therapy

## 2017-10-01 ENCOUNTER — Encounter: Payer: Self-pay | Admitting: Physical Therapy

## 2017-10-01 DIAGNOSIS — M545 Low back pain: Secondary | ICD-10-CM

## 2017-10-01 DIAGNOSIS — R293 Abnormal posture: Secondary | ICD-10-CM

## 2017-10-01 DIAGNOSIS — M6281 Muscle weakness (generalized): Secondary | ICD-10-CM

## 2017-10-01 NOTE — Therapy (Signed)
[facility] [place], [place], [address] Phone: [phone]   Fax:  [phone]  Physical Therapy Treatment  Patient Details  Name: Cheryl Lee MRN: [id number] Date of Birth: [birth year] Referring Provider: Dr. Kary Kos   Encounter Date: 10/01/2017  PT End of Session - 10/01/17 1019    Visit Number  32    Number of Visits  32    Date for PT Re-Evaluation  10/03/17    Activity Tolerance  Patient tolerated treatment well    Behavior During Therapy  [facility] for tasks assessed/performed       Past Medical History:  Diagnosis Date  . Acid reflux   . Arthritis   . HNP (herniated nucleus pulposus), lumbar   . Hypercholesteremia   . Hypertension   . Osteoporosis   . Sinus congestion     Past Surgical History:  Procedure Laterality Date  . CATARACT EXTRACTION    . COLONOSCOPY    . LUMBAR LAMINECTOMY/DECOMPRESSION MICRODISCECTOMY Left 05/01/2017   Procedure: Microdiscectomy - Lumbar four-five  - left;  Surgeon: Kary Kos, MD;  Location: [facility];  Service: Neurosurgery;  Laterality: Left;  . Right snkle repair      There were no vitals filed for this visit.  Subjective Assessment - 10/01/17 1007    Subjective  That dry needling makes me sore but I'd like to try it again.    Currently in Pain?  Yes    Pain Score  2     Pain Location  Leg    Pain Orientation  Left    Pain Descriptors / Indicators  Discomfort                       [facility] Adult PT Treatment/Exercise - 10/01/17 0001      Exercises   Exercises  Knee/Hip      Lumbar Exercises: Aerobic   Nustep  Level 5 x 15 minutes.      Modalities   Modalities  Electrical Stimulation;Moist Heat      Moist Heat Therapy   Number Minutes Moist Heat  15 Minutes    Moist Heat Location  Lumbar Spine      Electrical Stimulation   Electrical Stimulation Action  IFC to left LB and left upper gluteal region.    Electrical Stimulation Parameters  80-150  Hz x 15 minutess.    Electrical Stimulation Goals  Pain      Manual Therapy   Manual Therapy  Soft tissue mobilization    Soft tissue mobilization  STW/M to left upper gluts and  left QL release technique that included ischemic release x 8 minutes.       Trigger Point Dry Needling - 10/01/17 1037    Consent Given?  Yes    Muscles Treated Lower Body  -- Glut min/med    Gluteus Minimus Response  Twitch response elicited             PT Short Term Goals - 09/10/17 0916      PT SHORT TERM GOAL #1   Title  I with initial HEP    Time  4    Period  Weeks    Status  Achieved      PT SHORT TERM GOAL #2   Title  Patient able to ambulate community distances safely with quad cane.    Time  4    Period  Weeks    Status  Partially Met Limited to  5 minutes in standing regardless of AD per patient report 09/10/2017        PT Long Term Goals - 10/01/17 1102      PT LONG TERM GOAL #1   Title  I with advanced HEP including osteoporosis decompression exercises.    Time  8    Period  Weeks    Status  Achieved      PT LONG TERM GOAL #2   Title  Patient able to be on her feet for ADLS for 30 min or more with pain 2/10 or less.    Baseline  5 min max    Time  8    Period  Weeks    Status  On-going      PT LONG TERM GOAL #3   Title  Patient able to ambulate community distances safely without AD.    Baseline  Occasionally.    Time  8    Period  Weeks    Status  On-going            Plan - 10/01/17 1104    Clinical Impression Statement  Please see discharge summary.    Consulted and Agree with Plan of Care  Patient       Patient will benefit from skilled therapeutic intervention in order to improve the following deficits and impairments:  Decreased range of motion, Pain, Decreased activity tolerance, Impaired flexibility, Postural dysfunction, Decreased strength  Visit Diagnosis: Acute midline low back pain, with sciatica presence unspecified  Abnormal  posture  Muscle weakness (generalized)     Problem List Patient Active Problem List   Diagnosis Date Noted  . HNP (herniated nucleus pulposus), lumbar 05/01/2017  . BMI 29.0-29.9,adult 12/21/2014  . GERD (gastroesophageal reflux disease)   . Hyperlipidemia with target LDL less than 100   . Hypertension   . Osteoporosis    PHYSICAL THERAPY DISCHARGE SUMMARY  Visits from Start of Care: 35.  Current functional level related to goals / functional outcomes: See above.   Remaining deficits: Patient had no pain complaints after treatment today.  Goals not met due pain not staying consistently low with ADL's.   Education / Equipment: HEP. Plan: Patient agrees to discharge.  Patient goals were partially met. Patient is being discharged due to being pleased with the current functional level.  ?????      [name], [place] MPT 10/01/2017, 11:05 AM  [facility] [address] [place], [place], [address] Phone: [phone]   Fax:  [phone]  Name: Cheryl Lee MRN: [id number] Date of Birth: 06-May-1946  [place]ali Renley Banwart MPT

## 2017-10-17 DIAGNOSIS — Z1231 Encounter for screening mammogram for malignant neoplasm of breast: Secondary | ICD-10-CM | POA: Diagnosis not present

## 2017-11-07 DIAGNOSIS — M5440 Lumbago with sciatica, unspecified side: Secondary | ICD-10-CM | POA: Diagnosis not present

## 2017-11-07 DIAGNOSIS — S32040A Wedge compression fracture of fourth lumbar vertebra, initial encounter for closed fracture: Secondary | ICD-10-CM | POA: Diagnosis not present

## 2017-11-19 ENCOUNTER — Telehealth: Payer: Self-pay | Admitting: *Deleted

## 2017-11-19 DIAGNOSIS — K219 Gastro-esophageal reflux disease without esophagitis: Secondary | ICD-10-CM

## 2017-11-19 DIAGNOSIS — I10 Essential (primary) hypertension: Secondary | ICD-10-CM

## 2017-11-19 DIAGNOSIS — M810 Age-related osteoporosis without current pathological fracture: Secondary | ICD-10-CM

## 2017-11-19 DIAGNOSIS — E785 Hyperlipidemia, unspecified: Secondary | ICD-10-CM

## 2017-11-19 MED ORDER — OMEPRAZOLE 20 MG PO CPDR: 20.0000 mg | DELAYED_RELEASE_CAPSULE | Freq: Every day | ORAL | 1 refills | Status: DC

## 2017-11-19 MED ORDER — FLUTICASONE PROPIONATE 50 MCG/ACT NA SUSP: 2.0000 | Freq: Every day | NASAL | 1 refills | Status: DC

## 2017-11-19 MED ORDER — ATORVASTATIN CALCIUM 10 MG PO TABS
10.0000 mg | ORAL_TABLET | Freq: Every day | ORAL | 1 refills | Status: DC
Start: 2017-11-19 — End: 2018-03-03

## 2017-11-19 MED ORDER — LISINOPRIL-HYDROCHLOROTHIAZIDE 20-12.5 MG PO TABS: 0.5000 | ORAL_TABLET | Freq: Every day | ORAL | 1 refills | Status: DC

## 2017-11-19 MED ORDER — ALENDRONATE SODIUM 70 MG PO TABS: 70.0000 mg | ORAL_TABLET | ORAL | 1 refills | Status: DC

## 2017-11-19 NOTE — Telephone Encounter (Signed)
Refills sent to pharmacy per Patient request.  Needs to schedule follow up appointment

## 2017-12-20 ENCOUNTER — Other Ambulatory Visit: Payer: Self-pay

## 2017-12-20 ENCOUNTER — Other Ambulatory Visit: Payer: Self-pay | Admitting: Family Medicine

## 2017-12-20 DIAGNOSIS — Z1239 Encounter for other screening for malignant neoplasm of breast: Secondary | ICD-10-CM

## 2017-12-31 ENCOUNTER — Ambulatory Visit (INDEPENDENT_AMBULATORY_CARE_PROVIDER_SITE_OTHER): Payer: Medicare Other | Admitting: *Deleted

## 2017-12-31 ENCOUNTER — Telehealth: Payer: Self-pay | Admitting: *Deleted

## 2017-12-31 ENCOUNTER — Encounter: Payer: Self-pay | Admitting: *Deleted

## 2017-12-31 VITALS — BP 138/90 | HR 96 | Ht 59.0 in | Wt 140.0 lb

## 2017-12-31 DIAGNOSIS — Z0000 Encounter for general adult medical examination without abnormal findings: Secondary | ICD-10-CM | POA: Diagnosis not present

## 2017-12-31 NOTE — Telephone Encounter (Signed)
Patient requested handicap placard form at [facility] today and I forgot to provide her with one.  Left message on home answering machine that I will fill it out and have Cheryl Lee sign it for her. Asked her to call back and let me know if she wants me to mail it to her or leave it at the front desk for her to pickup.

## 2017-12-31 NOTE — Patient Instructions (Signed)
  Cheryl Lee , Thank you for taking time to come for your Medicare Wellness Visit. I appreciate your ongoing commitment to your health goals. Please review the following plan we discussed and let me know if I can assist you in the future.   These are the goals we discussed: Goals    . Exercise 150 min/wk Moderate Activity       This is a list of the screening recommended for you and due dates:  Health Maintenance  Topic Date Due  . Flu Shot  12/12/2017  . Colon Cancer Screening  08/13/2018  . Mammogram  10/18/2019  . Tetanus Vaccine  12/12/2021  . DEXA scan (bone density measurement)  Completed  .  Hepatitis C: One time screening is recommended by Center for Disease Control  (CDC) for  adults born from [birth year] through 1965.   Completed  . Pneumonia vaccines  Completed

## 2017-12-31 NOTE — Progress Notes (Addendum)
Subjective:   Cheryl Lee is a 72 y.o. female who presents for a Medicare Annual Wellness Visit. Cheryl Lee is divorced and lives at home alone. She has a brother that lives down the road from her and several other family members close by. Her sister buys her groceries and her niece usually drops them off. They all check on her regularly. She has a daughter that calls to check on her but she doesn't visit very often. She also has two granddaughters.    Review of Systems    Patient reports that her overall health is unchanged compared to last year.  Cardiac Risk Factors include: advanced age (>76men, >32 women);dyslipidemia;hypertension  Musculoskeletal: back pain-does physical therapy exercises and applies ice and heat. Also takes ibuprofen in the mornings.   All other systems negative       Current Medications (verified) Outpatient Encounter Medications as of 12/31/2017  Medication Sig  . alendronate (FOSAMAX) 70 MG tablet Take 1 tablet (70 mg total) by mouth every Monday. Take with a full glass of water on an empty stomach.  Marland Kitchen aspirin 81 MG tablet Take 81 mg by mouth daily.  Marland Kitchen atorvastatin (LIPITOR) 10 MG tablet Take 1 tablet (10 mg total) by mouth daily.  . fluticasone (FLONASE) 50 MCG/ACT nasal spray Place 2 sprays into both nostrils daily.  Marland Kitchen ibuprofen (ADVIL,MOTRIN) 200 MG tablet Take 200 mg by mouth every 6 (six) hours as needed.  Marland Kitchen ibuprofen (ADVIL,MOTRIN) 400 MG tablet Take 1 tablet (400 mg total) by mouth every 8 (eight) hours as needed.  Marland Kitchen lisinopril-hydrochlorothiazide (PRINZIDE,ZESTORETIC) 20-12.5 MG tablet Take 0.5 tablets by mouth daily.  . Multiple Vitamins-Minerals (MULTIVITAMINS THER. W/MINERALS) TABS Take 0.5 tablets by mouth 2 (two) times daily.   Marland Kitchen omeprazole (PRILOSEC) 20 MG capsule Take 1 capsule (20 mg total) by mouth daily.  . vitamin C (ASCORBIC ACID) 500 MG tablet Take 500 mg by mouth daily.     No facility-administered encounter medications on file as  of 12/31/2017.     Allergies (verified) Lycopene   History: Past Medical History:  Diagnosis Date  . Acid reflux   . Arthritis   . HNP (herniated nucleus pulposus), lumbar   . Hypercholesteremia   . Hypertension   . Osteoporosis   . Sinus congestion    Past Surgical History:  Procedure Laterality Date  . CATARACT EXTRACTION    . COLONOSCOPY    . LUMBAR LAMINECTOMY/DECOMPRESSION MICRODISCECTOMY Left 05/01/2017   Procedure: Microdiscectomy - Lumbar four-five  - left;  Surgeon: Kary Kos, MD;  Location: [facility];  Service: Neurosurgery;  Laterality: Left;  . Right snkle repair     Family History  Problem Relation Age of Onset  . Cancer Mother   . Alzheimer's disease Mother   . Heart disease Mother   . Early death Father   . Diabetes Sister   . Stroke Brother   . Diabetes Sister   . Cancer Sister 37       uterine  . Hypertension Sister   . Healthy Daughter    Social History   Socioeconomic History  . Marital status: Divorced    Spouse name: Not on file  . Number of children: 1  . Years of education: 34  . Highest education level: 12th grade  Occupational History  . Occupation: retired  Scientific laboratory technician  . Financial resource strain: Not hard at all  . Food insecurity:    Worry: Never true    Inability: Never true  .  Transportation needs:    Medical: No    Non-medical: No  Tobacco Use  . Smoking status: Never Smoker  . Smokeless tobacco: Never Used  Substance and Sexual Activity  . Alcohol use: No  . Drug use: No  . Sexual activity: Not Currently  Lifestyle  . Physical activity:    Days per week: 7 days    Minutes per session: 60 min  . Stress: Not at all  Relationships  . Social connections:    Talks on phone: More than three times a week    Gets together: More than three times a week    Attends religious service: More than 4 times per year    Active member of club or organization: Yes    Attends meetings of clubs or organizations: More than 4 times per  year    Relationship status: Divorced  Other Topics Concern  . Not on file  Social History Narrative  . Not on file    Tobacco Use No.  Clinical Intake:     Pain : 0-10 Pain Type: Chronic pain Pain Location: Back Pain Orientation: Left Pain Descriptors / Indicators: Aching Pain Onset: More than a month ago Pain Frequency: Constant Pain Relieving Factors: Advil 200mg  in the morning Effect of Pain on Daily Activities: Moderate but it is improving  Pain Relieving Factors: Advil 200mg  in the morning  Nutritional Status: BMI 25 -29 Overweight Diabetes: No  How often do you need to have someone help you when you read instructions, pamphlets, or other written materials from your doctor or pharmacy?: 1 - Never What is the last grade level you completed in school?: 12  Interpreter Needed?: No  Information entered by :: Chong Sicilian, RN   Activities of Daily Living In your present state of health, do you have any difficulty performing the following activities: 12/31/2017 05/01/2017  Hearing? N N  Vision? Y Y  Comment blind in rigth eye. wears glasses and can see out of left eye blind right eye  Difficulty concentrating or making decisions? N N  Walking or climbing stairs? Cheryl Lee  Comment uses a cane. REcent back surgery -  Dressing or bathing? N N  Doing errands, shopping? N -  Preparing Food and eating ? N -  Using the Toilet? N -  In the past six months, have you accidently leaked urine? N -  Do you have problems with loss of bowel control? N -  Managing your Medications? N -  Managing your Finances? N -  Housekeeping or managing your Housekeeping? N -  Some recent data might be hidden     Diet Eats small meals Breakfast-bacon, sausage, and eggs Lunch-sandwich with one piece of bread Supper-vegetables (home prepared) Drinks water, gatorade, and sierra mist and a cup of coffee for breakfast  Exercise Current Exercise Habits: Home exercise routine, Type of  exercise: walking;strength training/weights(does home physical therapy daily and walks at least 30 minutes a day. Will break if up and rest in between walks), Time (Minutes): 40, Frequency (Times/Week): 7, Weekly Exercise (Minutes/Week): 280, Intensity: Mild, Exercise limited by: neurologic condition(s)   Depression Screen PHQ 2/9 Scores 12/31/2017 08/27/2017 02/11/2017 12/31/2016 12/28/2016 11/29/2016 06/01/2016  PHQ - 2 Score 0 0 0 0 0 0 0     Fall Risk Fall Risk  12/31/2017 08/27/2017 02/11/2017 12/31/2016 12/28/2016  Falls in the past year? No No No No No  Number falls in past yr: - - - - -  Injury with Fall? - - - - -  Risk Factor Category  - - - - -  Risk for fall due to : - - - - -    Safety Is the patient's home free of loose throw rugs in walkways, pet beds, electrical cords, etc?   yes      Grab bars in the bathroom? no      Walkin shower? no      Shower Seat? no      Handrails on the stairs?   yes      Adequate lighting?   yes  Patient Care Team: Theodoro Clock as PCP - General (Physician Assistant)  Hospitalizations, surgeries, and ER visits in previous 12 months No hospitalizations, ER visits, or surgeries this past year.  Objective:    Today's Vitals   12/31/17 1007  BP: 138/90  Pulse: 96  Weight: 140 lb (63.5 kg)  Height: 4\' 11"  (1.499 m)   Body mass index is 28.28 kg/m.  Advanced Directives 12/31/2017 06/11/2017 02/17/2017 02/15/2017 02/13/2017 02/28/2015 03/03/2014  Does Patient Have a Medical Advance Directive? No No No No No No No  Would patient like information on creating a medical advance directive? No - Patient declined No - Patient declined No - Patient declined - - Yes - Scientist, clinical (histocompatibility and immunogenetics) given Yes - Scientist, clinical (histocompatibility and immunogenetics) given    Hearing/Vision  No hearing or vision deficits noted during visit.  Cognitive Function: MMSE - Mini Mental State Exam 12/31/2017 02/28/2015  Orientation to time 5 5  Orientation to Place 5 5  Registration 3 3    Attention/ Calculation 5 5  Recall 3 3  Language- name 2 objects 2 2  Language- repeat 1 1  Language- follow 3 step command 3 3  Language- read & follow direction 1 1  Write a sentence 1 1  Copy design 1 1  Total score 30 30       Normal Cognitive Function Screening: Yes    Immunizations and Health Maintenance Immunization History  Administered Date(s) Administered  . Pneumococcal Conjugate-13 09/03/2014  . Pneumococcal Polysaccharide-23 08/30/2012   Health Maintenance Due  Topic Date Due  . INFLUENZA VACCINE  12/12/2017   Health Maintenance  Topic Date Due  . INFLUENZA VACCINE  12/12/2017  . COLONOSCOPY  08/13/2018  . MAMMOGRAM  10/18/2019  . TETANUS/TDAP  12/12/2021  . DEXA SCAN  Completed  . Hepatitis C Screening  Completed  . PNA vac Low Risk Adult  Completed        Assessment:   This is a routine wellness examination for Cheryl Lee.    Plan:    Goals    . Exercise 150 min/wk Moderate Activity        Health Maintenance Recommendations: Colorectal cancer screening   Additional Screening Recommendations: Lung: Low Dose CT Chest recommended if Age 67-80 years, 30 pack-year currently smoking OR have quit w/in 15years. Patient does not qualify. Hepatitis C Screening recommended: no  Keep f/u with Terald Sleeper, PA-C and any other specialty appointments you may have Continue current medications Move carefully to avoid falls. Use assistive devices like a cane or walker if needed. Aim for at least 150 minutes of moderate activity a week. This can be done with chair exercises if necessary. Read or work on puzzles daily Stay connected with friends and family  I have personally reviewed and noted the following in the patient's chart:   . Medical and social history . Use of alcohol, tobacco or illicit drugs  . Current medications and supplements .  Functional ability and status . Nutritional status . Physical activity . Advanced directives . List of  other physicians . Hospitalizations, surgeries, and ER visits in previous 12 months . Vitals . Screenings to include cognitive, depression, and falls . Referrals and appointments  In addition, I have reviewed and discussed with patient certain preventive protocols, quality metrics, and best practice recommendations. A written personalized care plan for preventive services as well as general preventive health recommendations were provided to patient.     Chong Sicilian, RN   12/31/2017   I have reviewed and agree with the above [facility] documentation.   Terald Sleeper PA-C [facility] [address]  [place], [place] [address] [phone]

## 2018-01-17 NOTE — Telephone Encounter (Signed)
Handicap placard paper mailed to patient at home address.

## 2018-03-03 ENCOUNTER — Encounter: Payer: Self-pay | Admitting: Physician Assistant

## 2018-03-03 ENCOUNTER — Ambulatory Visit (INDEPENDENT_AMBULATORY_CARE_PROVIDER_SITE_OTHER): Payer: Medicare Other | Admitting: Physician Assistant

## 2018-03-03 VITALS — BP 115/79 | HR 84 | Temp 98.9°F | Ht 59.0 in | Wt 143.0 lb

## 2018-03-03 DIAGNOSIS — I10 Essential (primary) hypertension: Secondary | ICD-10-CM | POA: Diagnosis not present

## 2018-03-03 DIAGNOSIS — M810 Age-related osteoporosis without current pathological fracture: Secondary | ICD-10-CM

## 2018-03-03 DIAGNOSIS — K219 Gastro-esophageal reflux disease without esophagitis: Secondary | ICD-10-CM | POA: Diagnosis not present

## 2018-03-03 DIAGNOSIS — E559 Vitamin D deficiency, unspecified: Secondary | ICD-10-CM | POA: Diagnosis not present

## 2018-03-03 DIAGNOSIS — E785 Hyperlipidemia, unspecified: Secondary | ICD-10-CM | POA: Diagnosis not present

## 2018-03-03 LAB — CBC WITH DIFFERENTIAL/PLATELET
BASOS: 1 %
Basophils Absolute: 0 10*3/uL (ref 0.0–0.2)
EOS (ABSOLUTE): 0.1 10*3/uL (ref 0.0–0.4)
Eos: 1 %
Hematocrit: 39.9 % (ref 34.0–46.6)
Hemoglobin: 13.1 g/dL (ref 11.1–15.9)
IMMATURE GRANULOCYTES: 0 %
Immature Grans (Abs): 0 10*3/uL (ref 0.0–0.1)
Lymphocytes Absolute: 1.9 10*3/uL (ref 0.7–3.1)
Lymphs: 23 %
MCH: 31.6 pg (ref 26.6–33.0)
MCHC: 32.8 g/dL (ref 31.5–35.7)
MCV: 96 fL (ref 79–97)
MONOS ABS: 0.6 10*3/uL (ref 0.1–0.9)
Monocytes: 7 %
NEUTROS PCT: 68 %
Neutrophils Absolute: 5.5 10*3/uL (ref 1.4–7.0)
PLATELETS: 350 10*3/uL (ref 150–450)
RBC: 4.14 x10E6/uL (ref 3.77–5.28)
RDW: 11.9 % — AB (ref 12.3–15.4)
WBC: 8.1 10*3/uL (ref 3.4–10.8)

## 2018-03-03 LAB — CMP14+EGFR
A/G RATIO: 1.9 (ref 1.2–2.2)
ALT: 15 IU/L (ref 0–32)
AST: 13 IU/L (ref 0–40)
Albumin: 4.1 g/dL (ref 3.5–4.8)
Alkaline Phosphatase: 73 IU/L (ref 39–117)
BUN/Creatinine Ratio: 21 (ref 12–28)
BUN: 19 mg/dL (ref 8–27)
Bilirubin Total: 0.4 mg/dL (ref 0.0–1.2)
CALCIUM: 9.8 mg/dL (ref 8.7–10.3)
CO2: 22 mmol/L (ref 20–29)
Chloride: 103 mmol/L (ref 96–106)
Creatinine, Ser: 0.92 mg/dL (ref 0.57–1.00)
GFR, EST AFRICAN AMERICAN: 72 mL/min/{1.73_m2} (ref >59)
GFR, EST NON AFRICAN AMERICAN: 63 mL/min/{1.73_m2} (ref >59)
Globulin, Total: 2.2 g/dL (ref 1.5–4.5)
Glucose: 99 mg/dL (ref 65–99)
Potassium: 4.5 mmol/L (ref 3.5–5.2)
Sodium: 140 mmol/L (ref 134–144)
TOTAL PROTEIN: 6.3 g/dL (ref 6.0–8.5)

## 2018-03-03 LAB — LIPID PANEL
CHOL/HDL RATIO: 3.1 ratio (ref 0.0–4.4)
Cholesterol, Total: 177 mg/dL (ref 100–199)
HDL: 57 mg/dL (ref >39)
LDL CALC: 87 mg/dL (ref 0–99)
TRIGLYCERIDES: 163 mg/dL — AB (ref 0–149)
VLDL CHOLESTEROL CAL: 33 mg/dL (ref 5–40)

## 2018-03-03 MED ORDER — OMEPRAZOLE 20 MG PO CPDR: 20.0000 mg | DELAYED_RELEASE_CAPSULE | Freq: Every day | ORAL | 1 refills | Status: DC

## 2018-03-03 MED ORDER — LISINOPRIL-HYDROCHLOROTHIAZIDE 20-12.5 MG PO TABS: 0.5000 | ORAL_TABLET | Freq: Every day | ORAL | 1 refills | Status: DC

## 2018-03-03 MED ORDER — ATORVASTATIN CALCIUM 10 MG PO TABS: 10.0000 mg | ORAL_TABLET | Freq: Every day | ORAL | 1 refills | Status: DC

## 2018-03-03 MED ORDER — ALENDRONATE SODIUM 70 MG PO TABS: 70.0000 mg | ORAL_TABLET | ORAL | 1 refills | Status: DC

## 2018-03-03 MED ORDER — FLUTICASONE PROPIONATE 50 MCG/ACT NA SUSP: 2.0000 | Freq: Every day | NASAL | 1 refills | Status: DC

## 2018-03-03 NOTE — Progress Notes (Signed)
BP 115/79   Pulse 84   Temp 98.9 F (37.2 C) (Oral)   Ht '4\' 11"'  (1.499 m)   Wt 143 lb (64.9 kg)   LMP 08/05/1991   BMI 28.88 kg/m    Subjective:    Patient ID: Cheryl Lee, female    DOB: Nov 04, 1945, 72 y.o.   MRN: [id number]  HPI: Cheryl Lee is a 72 y.o. female presenting on 03/03/2018 for Hypertension (Establish Care with you switching PCP) and Hyperlipidemia  This patient comes in for periodic recheck on her chronic medical conditions.  She does have hypertension and hyperlipidemia.  She also has osteopenia.  She did have osteoporosis but the Fosamax has improved her numbers that she is back in the osteopenia range.  Last year she did have surgery on a herniated disc in her low back.  Dr. Saintclair Halsted performed the surgery.  She reports that she is better than she was before the surgery but not completely normal again.  She has been continuing with walking and exercises taught to her by the physical therapist.  She does not have any other complaints at this time.  Past Medical History:  Diagnosis Date  . Acid reflux   . Arthritis   . HNP (herniated nucleus pulposus), lumbar   . Hypercholesteremia   . Hypertension   . Osteoporosis   . Sinus congestion    Relevant past medical, surgical, family and social history reviewed and updated as indicated. Interim medical history since our last visit reviewed. Allergies and medications reviewed and updated. DATA REVIEWED: CHART IN EPIC  Family History reviewed for pertinent findings.  Review of Systems  Constitutional: Negative.  Negative for activity change, fatigue and fever.  HENT: Negative.   Eyes: Negative.   Respiratory: Negative.  Negative for cough.   Cardiovascular: Negative.  Negative for chest pain.  Gastrointestinal: Negative.  Negative for abdominal pain.  Endocrine: Negative.   Genitourinary: Negative.  Negative for dysuria.  Musculoskeletal: Positive for back pain, gait problem and myalgias.  Skin: Negative.      Allergies as of 03/03/2018      Reactions   Lycopene Itching, Rash      Medication List        Accurate as of 03/03/18  9:38 AM. Always use your most recent med list.          alendronate 70 MG tablet Commonly known as:  FOSAMAX Take 1 tablet (70 mg total) by mouth every Monday. Take with a full glass of water on an empty stomach.   aspirin 81 MG tablet Take 81 mg by mouth daily.   atorvastatin 10 MG tablet Commonly known as:  LIPITOR Take 1 tablet (10 mg total) by mouth daily.   fluticasone 50 MCG/ACT nasal spray Commonly known as:  FLONASE Place 2 sprays into both nostrils daily.   ibuprofen 200 MG tablet Commonly known as:  ADVIL,MOTRIN Take 200 mg by mouth every 6 (six) hours as needed.   lisinopril-hydrochlorothiazide 20-12.5 MG tablet Commonly known as:  PRINZIDE,ZESTORETIC Take 0.5 tablets by mouth daily.   multivitamins ther. w/minerals Tabs tablet Take 0.5 tablets by mouth 2 (two) times daily.   omeprazole 20 MG capsule Commonly known as:  PRILOSEC Take 1 capsule (20 mg total) by mouth daily.   vitamin C 500 MG tablet Commonly known as:  ASCORBIC ACID Take 500 mg by mouth daily.          Objective:    BP 115/79  Pulse 84   Temp 98.9 F (37.2 C) (Oral)   Ht '4\' 11"'  (1.499 m)   Wt 143 lb (64.9 kg)   LMP 08/05/1991   BMI 28.88 kg/m   Allergies  Allergen Reactions  . Lycopene Itching and Rash    Wt Readings from Last 3 Encounters:  03/03/18 143 lb (64.9 kg)  12/31/17 140 lb (63.5 kg)  08/27/17 145 lb (65.8 kg)    Physical Exam  Constitutional: She is oriented to person, place, and time. She appears well-developed and well-nourished.  HENT:  Head: Normocephalic and atraumatic.  Eyes: Pupils are equal, round, and reactive to light. Conjunctivae and EOM are normal.  Cardiovascular: Normal rate, regular rhythm, normal heart sounds and intact distal pulses.  Pulmonary/Chest: Effort normal and breath sounds normal.  Abdominal: Soft.  Bowel sounds are normal.  Neurological: She is alert and oriented to person, place, and time. She has normal reflexes.  Skin: Skin is warm and dry. No rash noted.  Psychiatric: She has a normal mood and affect. Her behavior is normal. Judgment and thought content normal.    Results for orders placed or performed in visit on 08/27/17  VITAMIN D 25 Hydroxy (Vit-D Deficiency, Fractures)  Result Value Ref Range   Vit D, 25-Hydroxy 34.0 30.0 - 100.0 ng/mL  CMP14+EGFR  Result Value Ref Range   Glucose 90 65 - 99 mg/dL   BUN 17 8 - 27 mg/dL   Creatinine, Ser 0.88 0.57 - 1.00 mg/dL   GFR calc non Af Amer 66 >59 mL/min/1.73   GFR calc Af Amer 76 >59 mL/min/1.73   BUN/Creatinine Ratio 19 12 - 28   Sodium 143 134 - 144 mmol/L   Potassium 4.4 3.5 - 5.2 mmol/L   Chloride 104 96 - 106 mmol/L   CO2 22 20 - 29 mmol/L   Calcium 9.5 8.7 - 10.3 mg/dL   Total Protein 6.3 6.0 - 8.5 g/dL   Albumin 4.1 3.5 - 4.8 g/dL   Globulin, Total 2.2 1.5 - 4.5 g/dL   Albumin/Globulin Ratio 1.9 1.2 - 2.2   Bilirubin Total 0.4 0.0 - 1.2 mg/dL   Alkaline Phosphatase 72 39 - 117 IU/L   AST 16 0 - 40 IU/L   ALT 11 0 - 32 IU/L      Assessment & Plan:   1. Essential hypertension - lisinopril-hydrochlorothiazide (PRINZIDE,ZESTORETIC) 20-12.5 MG tablet; Take 0.5 tablets by mouth daily.  Dispense: 45 tablet; Refill: 1 - CBC with Differential/Platelet - CMP14+EGFR - Lipid panel - VITAMIN D 25 Hydroxy (Vit-D Deficiency, Fractures); Future  2. Gastroesophageal reflux disease without esophagitis - omeprazole (PRILOSEC) 20 MG capsule; Take 1 capsule (20 mg total) by mouth daily.  Dispense: 90 capsule; Refill: 1  3. Age-related osteoporosis without current pathological fracture - alendronate (FOSAMAX) 70 MG tablet; Take 1 tablet (70 mg total) by mouth every Monday. Take with a full glass of water on an empty stomach.  Dispense: 12 tablet; Refill: 1  4. Hyperlipidemia with target LDL less than 100 - atorvastatin  (LIPITOR) 10 MG tablet; Take 1 tablet (10 mg total) by mouth daily.  Dispense: 90 tablet; Refill: 1 - Lipid panel  5. Vitamin D deficiency - VITAMIN D 25 Hydroxy (Vit-D Deficiency, Fractures); Future   Continue all other maintenance medications as listed above.  Follow up plan: Return in about 4 months (around 07/04/2018) for recheck.  Educational handout given for [place] PA-C [place],  [place] [address] [phone]   03/03/2018, 9:38 AM

## 2018-03-03 NOTE — Patient Instructions (Addendum)
Aqua glycolic face cream   In a few days you may receive a survey in the mail or online from [employer] regarding your visit with [place] today. Please take a moment to fill this out. Your feedback is very important to our whole office. It can help [place] better understand your needs as well as improve your experience and satisfaction. Thank you for taking your time to complete it. We care about you.  Particia Nearing, PA-C

## 2018-03-17 ENCOUNTER — Telehealth: Payer: Self-pay | Admitting: Physician Assistant

## 2018-03-17 NOTE — Telephone Encounter (Signed)
Patient aware that she will need to have redrawn.

## 2018-03-20 ENCOUNTER — Other Ambulatory Visit: Payer: Medicare Other

## 2018-03-20 DIAGNOSIS — I10 Essential (primary) hypertension: Secondary | ICD-10-CM | POA: Diagnosis not present

## 2018-03-20 DIAGNOSIS — E559 Vitamin D deficiency, unspecified: Secondary | ICD-10-CM | POA: Diagnosis not present

## 2018-03-21 LAB — VITAMIN D 25 HYDROXY (VIT D DEFICIENCY, FRACTURES): Vit D, 25-Hydroxy: 39.3 ng/mL (ref 30.0–100.0)

## 2018-07-07 ENCOUNTER — Ambulatory Visit (INDEPENDENT_AMBULATORY_CARE_PROVIDER_SITE_OTHER): Payer: Medicare Other | Admitting: Physician Assistant

## 2018-07-07 ENCOUNTER — Encounter: Payer: Self-pay | Admitting: Physician Assistant

## 2018-07-07 VITALS — BP 127/83 | HR 92 | Temp 98.6°F | Ht 59.0 in | Wt 145.8 lb

## 2018-07-07 DIAGNOSIS — M810 Age-related osteoporosis without current pathological fracture: Secondary | ICD-10-CM

## 2018-07-07 DIAGNOSIS — I10 Essential (primary) hypertension: Secondary | ICD-10-CM

## 2018-07-07 DIAGNOSIS — M5136 Other intervertebral disc degeneration, lumbar region: Secondary | ICD-10-CM | POA: Diagnosis not present

## 2018-07-07 DIAGNOSIS — M5416 Radiculopathy, lumbar region: Secondary | ICD-10-CM | POA: Diagnosis not present

## 2018-07-07 DIAGNOSIS — E785 Hyperlipidemia, unspecified: Secondary | ICD-10-CM | POA: Diagnosis not present

## 2018-07-07 DIAGNOSIS — M5126 Other intervertebral disc displacement, lumbar region: Secondary | ICD-10-CM | POA: Diagnosis not present

## 2018-07-07 DIAGNOSIS — K219 Gastro-esophageal reflux disease without esophagitis: Secondary | ICD-10-CM

## 2018-07-07 DIAGNOSIS — M51369 Other intervertebral disc degeneration, lumbar region without mention of lumbar back pain or lower extremity pain: Secondary | ICD-10-CM

## 2018-07-07 NOTE — Patient Instructions (Signed)
In a few days you may receive a survey in the mail or online from [employer] regarding your visit with us today. Please take a moment to fill this out. Your feedback is very important to our whole office. It can help us better understand your needs as well as improve your experience and satisfaction. Thank you for taking your time to complete it. We care about you.  Rainy Rothman, PA-C  

## 2018-07-08 DIAGNOSIS — M5416 Radiculopathy, lumbar region: Secondary | ICD-10-CM | POA: Insufficient documentation

## 2018-07-08 DIAGNOSIS — M5136 Other intervertebral disc degeneration, lumbar region: Secondary | ICD-10-CM | POA: Insufficient documentation

## 2018-07-08 LAB — CMP14+EGFR
ALBUMIN: 4.1 g/dL (ref 3.7–4.7)
ALT: 16 IU/L (ref 0–32)
AST: 13 IU/L (ref 0–40)
Albumin/Globulin Ratio: 1.9 (ref 1.2–2.2)
Alkaline Phosphatase: 89 IU/L (ref 39–117)
BUN / CREAT RATIO: 23 (ref 12–28)
BUN: 23 mg/dL (ref 8–27)
Bilirubin Total: 0.2 mg/dL (ref 0.0–1.2)
CO2: 23 mmol/L (ref 20–29)
CREATININE: 1 mg/dL (ref 0.57–1.00)
Calcium: 9.6 mg/dL (ref 8.7–10.3)
Chloride: 103 mmol/L (ref 96–106)
GFR calc non Af Amer: 56 mL/min/{1.73_m2} — ABNORMAL LOW (ref >59)
GFR, EST AFRICAN AMERICAN: 65 mL/min/{1.73_m2} (ref >59)
GLOBULIN, TOTAL: 2.2 g/dL (ref 1.5–4.5)
Glucose: 94 mg/dL (ref 65–99)
Potassium: 4.4 mmol/L (ref 3.5–5.2)
SODIUM: 144 mmol/L (ref 134–144)
Total Protein: 6.3 g/dL (ref 6.0–8.5)

## 2018-07-08 LAB — CBC WITH DIFFERENTIAL/PLATELET
BASOS ABS: 0.1 10*3/uL (ref 0.0–0.2)
Basos: 1 %
EOS (ABSOLUTE): 0.1 10*3/uL (ref 0.0–0.4)
Eos: 1 %
Hematocrit: 38.7 % (ref 34.0–46.6)
Hemoglobin: 13.5 g/dL (ref 11.1–15.9)
IMMATURE GRANS (ABS): 0 10*3/uL (ref 0.0–0.1)
Immature Granulocytes: 0 %
LYMPHS: 24 %
Lymphocytes Absolute: 2.4 10*3/uL (ref 0.7–3.1)
MCH: 31.8 pg (ref 26.6–33.0)
MCHC: 34.9 g/dL (ref 31.5–35.7)
MCV: 91 fL (ref 79–97)
Monocytes Absolute: 0.6 10*3/uL (ref 0.1–0.9)
Monocytes: 6 %
NEUTROS ABS: 6.8 10*3/uL (ref 1.4–7.0)
Neutrophils: 68 %
Platelets: 322 10*3/uL (ref 150–450)
RBC: 4.25 x10E6/uL (ref 3.77–5.28)
RDW: 12.4 % (ref 11.7–15.4)
WBC: 10 10*3/uL (ref 3.4–10.8)

## 2018-07-08 LAB — LIPID PANEL
CHOL/HDL RATIO: 3.5 ratio (ref 0.0–4.4)
Cholesterol, Total: 185 mg/dL (ref 100–199)
HDL: 53 mg/dL (ref >39)
LDL CALC: 103 mg/dL — AB (ref 0–99)
Triglycerides: 147 mg/dL (ref 0–149)
VLDL Cholesterol Cal: 29 mg/dL (ref 5–40)

## 2018-07-08 NOTE — Progress Notes (Signed)
BP 127/83   Pulse 92   Temp 98.6 F (37 C) (Oral)   Ht 4' 11" (1.499 m)   Wt 145 lb 12.8 oz (66.1 kg)   LMP 08/05/1991   BMI 29.45 kg/m    Subjective:    Patient ID: Cheryl Lee, female    DOB: [birth year], 73 y.o.   MRN: [id number]  HPI: Cheryl Lee is a 73 y.o. female presenting on 07/07/2018 for Hypertension; Hyperlipidemia; and Sinusitis  Hypertension: controlled, no complaints, medications are needed.  Hyperlipidemia, lumbar DDD and nerve impingement.  She deals with the sciatica since 2018 and last MRI was Jan 2019 under Dr. Saintclair Halsted.  She will still have flares in the lumbar area and down th elegs.  At this time her pain will be 1-2/10.  Past Medical History:  Diagnosis Date  . Acid reflux   . Arthritis   . HNP (herniated nucleus pulposus), lumbar   . Hypercholesteremia   . Hypertension   . Osteoporosis   . Sinus congestion    Relevant past medical, surgical, family and social history reviewed and updated as indicated. Interim medical history since our last visit reviewed. Allergies and medications reviewed and updated. DATA REVIEWED: CHART IN EPIC  Family History reviewed for pertinent findings.  Review of Systems  Constitutional: Negative.   HENT: Negative.   Eyes: Negative.   Respiratory: Negative.   Gastrointestinal: Negative.   Genitourinary: Negative.   Musculoskeletal: Positive for arthralgias, back pain and myalgias.    Allergies as of 07/07/2018      Reactions   Lycopene Itching, Rash      Medication List       Accurate as of July 07, 2018 11:59 PM. Always use your most recent med list.        alendronate 70 MG tablet Commonly known as:  FOSAMAX Take 1 tablet (70 mg total) by mouth every Monday. Take with a full glass of water on an empty stomach.   aspirin 81 MG tablet Take 81 mg by mouth daily.   atorvastatin 10 MG tablet Commonly known as:  LIPITOR Take 1 tablet (10 mg total) by mouth daily.   fluticasone 50 MCG/ACT nasal  spray Commonly known as:  FLONASE Place 2 sprays into both nostrils daily.   ibuprofen 200 MG tablet Commonly known as:  ADVIL,MOTRIN Take 200 mg by mouth every 6 (six) hours as needed.   lisinopril-hydrochlorothiazide 20-12.5 MG tablet Commonly known as:  PRINZIDE,ZESTORETIC Take 0.5 tablets by mouth daily.   multivitamins ther. w/minerals Tabs tablet Take 0.5 tablets by mouth 2 (two) times daily.   omeprazole 20 MG capsule Commonly known as:  PRILOSEC Take 1 capsule (20 mg total) by mouth daily.   vitamin C 500 MG tablet Commonly known as:  ASCORBIC ACID Take 500 mg by mouth daily.          Objective:    BP 127/83   Pulse 92   Temp 98.6 F (37 C) (Oral)   Ht 4' 11" (1.499 m)   Wt 145 lb 12.8 oz (66.1 kg)   LMP 08/05/1991   BMI 29.45 kg/m   Allergies  Allergen Reactions  . Lycopene Itching and Rash    Wt Readings from Last 3 Encounters:  07/07/18 145 lb 12.8 oz (66.1 kg)  03/03/18 143 lb (64.9 kg)  12/31/17 140 lb (63.5 kg)    Physical Exam Constitutional:      Appearance: She is well-developed.  HENT:     Head:  Normocephalic and atraumatic.  Eyes:     Conjunctiva/sclera: Conjunctivae normal.     Pupils: Pupils are equal, round, and reactive to light.  Cardiovascular:     Rate and Rhythm: Normal rate and regular rhythm.     Heart sounds: Normal heart sounds.  Pulmonary:     Effort: Pulmonary effort is normal.     Breath sounds: Normal breath sounds.  Abdominal:     General: Bowel sounds are normal.     Palpations: Abdomen is soft.  Musculoskeletal:     Lumbar back: She exhibits decreased range of motion, tenderness, pain and spasm.  Skin:    General: Skin is warm and dry.     Findings: No rash.  Neurological:     Mental Status: She is alert and oriented to person, place, and time.     Deep Tendon Reflexes: Reflexes are normal and symmetric.  Psychiatric:        Behavior: Behavior normal.        Thought Content: Thought content normal.         Judgment: Judgment normal.     Results for orders placed or performed in visit on 07/07/18  CMP14+EGFR  Result Value Ref Range   Glucose 94 65 - 99 mg/dL   BUN 23 8 - 27 mg/dL   Creatinine, Ser 1.00 0.57 - 1.00 mg/dL   GFR calc non Af Amer 56 (L) >59 mL/min/1.73   GFR calc Af Amer 65 >59 mL/min/1.73   BUN/Creatinine Ratio 23 12 - 28   Sodium 144 134 - 144 mmol/L   Potassium 4.4 3.5 - 5.2 mmol/L   Chloride 103 96 - 106 mmol/L   CO2 23 20 - 29 mmol/L   Calcium 9.6 8.7 - 10.3 mg/dL   Total Protein 6.3 6.0 - 8.5 g/dL   Albumin 4.1 3.7 - 4.7 g/dL   Globulin, Total 2.2 1.5 - 4.5 g/dL   Albumin/Globulin Ratio 1.9 1.2 - 2.2   Bilirubin Total 0.2 0.0 - 1.2 mg/dL   Alkaline Phosphatase 89 39 - 117 IU/L   AST 13 0 - 40 IU/L   ALT 16 0 - 32 IU/L  Lipid panel  Result Value Ref Range   Cholesterol, Total 185 100 - 199 mg/dL   Triglycerides 147 0 - 149 mg/dL   HDL 53 >39 mg/dL   VLDL Cholesterol Cal 29 5 - 40 mg/dL   LDL Calculated 103 (H) 0 - 99 mg/dL   Chol/HDL Ratio 3.5 0.0 - 4.4 ratio  CBC with Differential/Platelet  Result Value Ref Range   WBC 10.0 3.4 - 10.8 x10E3/uL   RBC 4.25 3.77 - 5.28 x10E6/uL   Hemoglobin 13.5 11.1 - 15.9 g/dL   Hematocrit 38.7 34.0 - 46.6 %   MCV 91 79 - 97 fL   MCH 31.8 26.6 - 33.0 pg   MCHC 34.9 31.5 - 35.7 g/dL   RDW 12.4 11.7 - 15.4 %   Platelets 322 150 - 450 x10E3/uL   Neutrophils 68 Not Estab. %   Lymphs 24 Not Estab. %   Monocytes 6 Not Estab. %   Eos 1 Not Estab. %   Basos 1 Not Estab. %   Neutrophils Absolute 6.8 1.4 - 7.0 x10E3/uL   Lymphocytes Absolute 2.4 0.7 - 3.1 x10E3/uL   Monocytes Absolute 0.6 0.1 - 0.9 x10E3/uL   EOS (ABSOLUTE) 0.1 0.0 - 0.4 x10E3/uL   Basophils Absolute 0.1 0.0 - 0.2 x10E3/uL   Immature Granulocytes 0 Not Estab. %  Immature Grans (Abs) 0.0 0.0 - 0.1 x10E3/uL      Assessment & Plan:   1. Lumbar nerve root impingement contine medication  2. DDD (degenerative disc disease), lumbar Continue  medication  3. Essential hypertension - CMP14+EGFR - CBC with Differential/Platelet  4. Hyperlipidemia with target LDL less than 100 - Lipid panel  5. HNP (herniated nucleus pulposus), lumbar history  6. Age-related osteoporosis without current pathological fracture monitor  7. Gastroesophageal reflux disease without esophagitis Continue medication   Continue all other maintenance medications as listed above.  Follow up plan: Return in about 6 months (around 01/05/2019) for recheck.  Educational handout given for [place] PA-C [place] [address]  [place], [place] [address] [phone]   07/08/2018, 8:47 PM

## 2018-10-29 ENCOUNTER — Other Ambulatory Visit: Payer: Self-pay | Admitting: *Deleted

## 2018-10-29 DIAGNOSIS — I10 Essential (primary) hypertension: Secondary | ICD-10-CM

## 2018-10-29 DIAGNOSIS — E785 Hyperlipidemia, unspecified: Secondary | ICD-10-CM

## 2018-10-29 DIAGNOSIS — K219 Gastro-esophageal reflux disease without esophagitis: Secondary | ICD-10-CM

## 2018-10-29 DIAGNOSIS — M810 Age-related osteoporosis without current pathological fracture: Secondary | ICD-10-CM

## 2018-10-29 MED ORDER — FLUTICASONE PROPIONATE 50 MCG/ACT NA SUSP: 2.0000 | Freq: Every day | NASAL | 1 refills | Status: DC

## 2018-10-29 MED ORDER — ATORVASTATIN CALCIUM 10 MG PO TABS: 10.0000 mg | ORAL_TABLET | Freq: Every day | ORAL | 1 refills | Status: DC

## 2018-10-29 MED ORDER — OMEPRAZOLE 20 MG PO CPDR: 20.0000 mg | DELAYED_RELEASE_CAPSULE | Freq: Every day | ORAL | 1 refills | Status: DC

## 2018-10-29 MED ORDER — LISINOPRIL-HYDROCHLOROTHIAZIDE 20-12.5 MG PO TABS: 0.5000 | ORAL_TABLET | Freq: Every day | ORAL | 1 refills | Status: DC

## 2018-10-29 MED ORDER — ALENDRONATE SODIUM 70 MG PO TABS: 70.0000 mg | ORAL_TABLET | ORAL | 1 refills | Status: DC

## 2019-01-02 ENCOUNTER — Other Ambulatory Visit: Payer: Self-pay

## 2019-01-05 ENCOUNTER — Encounter: Payer: Self-pay | Admitting: Physician Assistant

## 2019-01-05 ENCOUNTER — Ambulatory Visit (INDEPENDENT_AMBULATORY_CARE_PROVIDER_SITE_OTHER): Payer: Medicare Other | Admitting: *Deleted

## 2019-01-05 ENCOUNTER — Other Ambulatory Visit: Payer: Self-pay

## 2019-01-05 ENCOUNTER — Ambulatory Visit (INDEPENDENT_AMBULATORY_CARE_PROVIDER_SITE_OTHER): Payer: Medicare Other | Admitting: Physician Assistant

## 2019-01-05 DIAGNOSIS — Z0000 Encounter for general adult medical examination without abnormal findings: Secondary | ICD-10-CM

## 2019-01-05 DIAGNOSIS — K219 Gastro-esophageal reflux disease without esophagitis: Secondary | ICD-10-CM | POA: Diagnosis not present

## 2019-01-05 DIAGNOSIS — M810 Age-related osteoporosis without current pathological fracture: Secondary | ICD-10-CM | POA: Diagnosis not present

## 2019-01-05 DIAGNOSIS — I10 Essential (primary) hypertension: Secondary | ICD-10-CM | POA: Diagnosis not present

## 2019-01-05 DIAGNOSIS — E785 Hyperlipidemia, unspecified: Secondary | ICD-10-CM

## 2019-01-05 MED ORDER — ATORVASTATIN CALCIUM 10 MG PO TABS: 10.0000 mg | ORAL_TABLET | Freq: Every day | ORAL | 1 refills | Status: DC

## 2019-01-05 MED ORDER — LISINOPRIL-HYDROCHLOROTHIAZIDE 20-12.5 MG PO TABS: 0.5000 | ORAL_TABLET | Freq: Every day | ORAL | 1 refills | Status: DC

## 2019-01-05 MED ORDER — OMEPRAZOLE 20 MG PO CPDR: 20.0000 mg | DELAYED_RELEASE_CAPSULE | Freq: Every day | ORAL | 1 refills | Status: DC

## 2019-01-05 MED ORDER — FLUTICASONE PROPIONATE 50 MCG/ACT NA SUSP: 2.0000 | Freq: Every day | NASAL | 1 refills | Status: DC

## 2019-01-05 MED ORDER — ALENDRONATE SODIUM 70 MG PO TABS: 70.0000 mg | ORAL_TABLET | ORAL | 3 refills | Status: DC

## 2019-01-05 NOTE — Progress Notes (Signed)
BP 114/77   Pulse 84   Temp 97.7 F (36.5 C) (Temporal)   Ht '4\' 11"'  (1.499 m)   Wt 145 lb 3.2 oz (65.9 kg)   LMP 08/05/1991   BMI 29.33 kg/m    Subjective:    Patient ID: Cheryl Lee, female    DOB: 03-Jun-1945, 73 y.o.   MRN: [id number]  HPI: Cheryl Lee is a 73 y.o. female presenting on 01/05/2019 for Medical Management of Chronic Issues (6 month )  This patient comes in for follow-up on her chronic medical conditions they do include hypertension, GERD, osteoporosis, hyperlipidemia.  We reviewed her file and she is up-to-date on most of her medications.  In order will be placed for her to have her labs performed.  She states she has not had any performed in 6 months.  She states that she is doing fairly well not having any difficulties.  She does need her Fosamax sent to the pharmacy.  She denies having any issues with her medications.  And she states overall she is quite stable.  She is staying home and staying safe.  No other complaints at this time.  Past Medical History:  Diagnosis Date  . Acid reflux   . Arthritis   . HNP (herniated nucleus pulposus), lumbar   . Hypercholesteremia   . Hypertension   . Osteoporosis   . Sinus congestion    Relevant past medical, surgical, family and social history reviewed and updated as indicated. Interim medical history since our last visit reviewed. Allergies and medications reviewed and updated. DATA REVIEWED: CHART IN EPIC  Family History reviewed for pertinent findings.  Review of Systems  Constitutional: Negative.   HENT: Negative.   Eyes: Negative.   Respiratory: Negative.   Gastrointestinal: Negative.   Genitourinary: Negative.     Allergies as of 01/05/2019      Reactions   Lycopene Itching, Rash      Medication List       Accurate as of January 05, 2019 10:22 AM. If you have any questions, ask your nurse or doctor.        alendronate 70 MG tablet Commonly known as: FOSAMAX Take 1 tablet (70 mg total) by  mouth every Monday. Take with a full glass of water on an empty stomach.   aspirin 81 MG tablet Take 81 mg by mouth daily.   atorvastatin 10 MG tablet Commonly known as: LIPITOR Take 1 tablet (10 mg total) by mouth daily.   fluticasone 50 MCG/ACT nasal spray Commonly known as: FLONASE Place 2 sprays into both nostrils daily.   ibuprofen 200 MG tablet Commonly known as: ADVIL Take 200 mg by mouth every 6 (six) hours as needed.   lisinopril-hydrochlorothiazide 20-12.5 MG tablet Commonly known as: ZESTORETIC Take 0.5 tablets by mouth daily.   multivitamins ther. w/minerals Tabs tablet Take 0.5 tablets by mouth 2 (two) times daily.   omeprazole 20 MG capsule Commonly known as: PRILOSEC Take 1 capsule (20 mg total) by mouth daily.   vitamin C 500 MG tablet Commonly known as: ASCORBIC ACID Take 500 mg by mouth daily.          Objective:    BP 114/77   Pulse 84   Temp 97.7 F (36.5 C) (Temporal)   Ht '4\' 11"'  (1.499 m)   Wt 145 lb 3.2 oz (65.9 kg)   LMP 08/05/1991   BMI 29.33 kg/m   Allergies  Allergen Reactions  . Lycopene Itching and Rash  Wt Readings from Last 3 Encounters:  01/05/19 145 lb 3.2 oz (65.9 kg)  07/07/18 145 lb 12.8 oz (66.1 kg)  03/03/18 143 lb (64.9 kg)    Physical Exam Constitutional:      General: She is not in acute distress.    Appearance: Normal appearance. She is well-developed.  HENT:     Head: Normocephalic and atraumatic.  Cardiovascular:     Rate and Rhythm: Normal rate.  Pulmonary:     Effort: Pulmonary effort is normal.  Skin:    General: Skin is warm and dry.     Findings: No rash.  Neurological:     Mental Status: She is alert and oriented to person, place, and time.     Deep Tendon Reflexes: Reflexes are normal and symmetric.     Results for orders placed or performed in visit on 07/07/18  CMP14+EGFR  Result Value Ref Range   Glucose 94 65 - 99 mg/dL   BUN 23 8 - 27 mg/dL   Creatinine, Ser 1.00 0.57 - 1.00  mg/dL   GFR calc non Af Amer 56 (L) >59 mL/min/1.73   GFR calc Af Amer 65 >59 mL/min/1.73   BUN/Creatinine Ratio 23 12 - 28   Sodium 144 134 - 144 mmol/L   Potassium 4.4 3.5 - 5.2 mmol/L   Chloride 103 96 - 106 mmol/L   CO2 23 20 - 29 mmol/L   Calcium 9.6 8.7 - 10.3 mg/dL   Total Protein 6.3 6.0 - 8.5 g/dL   Albumin 4.1 3.7 - 4.7 g/dL   Globulin, Total 2.2 1.5 - 4.5 g/dL   Albumin/Globulin Ratio 1.9 1.2 - 2.2   Bilirubin Total 0.2 0.0 - 1.2 mg/dL   Alkaline Phosphatase 89 39 - 117 IU/L   AST 13 0 - 40 IU/L   ALT 16 0 - 32 IU/L  Lipid panel  Result Value Ref Range   Cholesterol, Total 185 100 - 199 mg/dL   Triglycerides 147 0 - 149 mg/dL   HDL 53 >39 mg/dL   VLDL Cholesterol Cal 29 5 - 40 mg/dL   LDL Calculated 103 (H) 0 - 99 mg/dL   Chol/HDL Ratio 3.5 0.0 - 4.4 ratio  CBC with Differential/Platelet  Result Value Ref Range   WBC 10.0 3.4 - 10.8 x10E3/uL   RBC 4.25 3.77 - 5.28 x10E6/uL   Hemoglobin 13.5 11.1 - 15.9 g/dL   Hematocrit 38.7 34.0 - 46.6 %   MCV 91 79 - 97 fL   MCH 31.8 26.6 - 33.0 pg   MCHC 34.9 31.5 - 35.7 g/dL   RDW 12.4 11.7 - 15.4 %   Platelets 322 150 - 450 x10E3/uL   Neutrophils 68 Not Estab. %   Lymphs 24 Not Estab. %   Monocytes 6 Not Estab. %   Eos 1 Not Estab. %   Basos 1 Not Estab. %   Neutrophils Absolute 6.8 1.4 - 7.0 x10E3/uL   Lymphocytes Absolute 2.4 0.7 - 3.1 x10E3/uL   Monocytes Absolute 0.6 0.1 - 0.9 x10E3/uL   EOS (ABSOLUTE) 0.1 0.0 - 0.4 x10E3/uL   Basophils Absolute 0.1 0.0 - 0.2 x10E3/uL   Immature Granulocytes 0 Not Estab. %   Immature Grans (Abs) 0.0 0.0 - 0.1 x10E3/uL      Assessment & Plan:   1. Age-related osteoporosis without current pathological fracture - alendronate (FOSAMAX) 70 MG tablet; Take 1 tablet (70 mg total) by mouth every Monday. Take with a full glass of water on an empty  stomach.  Dispense: 13 tablet; Refill: 3 - TSH - VITAMIN D 25 Hydroxy (Vit-D Deficiency, Fractures)  2. Gastroesophageal reflux disease  without esophagitis - omeprazole (PRILOSEC) 20 MG capsule; Take 1 capsule (20 mg total) by mouth daily.  Dispense: 90 capsule; Refill: 1  3. Essential hypertension - CBC with Differential/Platelet - CMP14+EGFR - Lipid panel - TSH - lisinopril-hydrochlorothiazide (ZESTORETIC) 20-12.5 MG tablet; Take 0.5 tablets by mouth daily.  Dispense: 45 tablet; Refill: 1  4. Hyperlipidemia with target LDL less than 100 - CBC with Differential/Platelet - CMP14+EGFR - Lipid panel - atorvastatin (LIPITOR) 10 MG tablet; Take 1 tablet (10 mg total) by mouth daily.  Dispense: 90 tablet; Refill: 1   Continue all other maintenance medications as listed above.  Follow up plan: Return in about 6 months (around 07/08/2019).  Educational handout given for [place] PA-C [facility] [address]  [place], [place] [address] [phone]   01/05/2019, 10:22 AM

## 2019-01-05 NOTE — Patient Instructions (Signed)
Preventive Care 73 Years and Older, Female Preventive care refers to lifestyle choices and visits with your health care provider that can promote health and wellness. This includes:  A yearly physical exam. This is also called an annual well check.  Regular dental and eye exams.  Immunizations.  Screening for certain conditions.  Healthy lifestyle choices, such as diet and exercise. What can I expect for my preventive care visit? Physical exam Your health care provider will check:  Height and weight. These may be used to calculate body mass index (BMI), which is a measurement that tells if you are at a healthy weight.  Heart rate and blood pressure.  Your skin for abnormal spots. Counseling Your health care provider may ask you questions about:  Alcohol, tobacco, and drug use.  Emotional well-being.  Home and relationship well-being.  Sexual activity.  Eating habits.  History of falls.  Memory and ability to understand (cognition).  Work and work Statistician.  Pregnancy and menstrual history. What immunizations do I need?  Influenza (flu) vaccine  This is recommended every year. Tetanus, diphtheria, and pertussis (Tdap) vaccine  You may need a Td booster every 10 years. Varicella (chickenpox) vaccine  You may need this vaccine if you have not already been vaccinated. Zoster (shingles) vaccine  You may need this after age 33. Pneumococcal conjugate (PCV13) vaccine  One dose is recommended after age 33. Pneumococcal polysaccharide (PPSV23) vaccine  One dose is recommended after age 72. Measles, mumps, and rubella (MMR) vaccine  You may need at least one dose of MMR if you were born in 1957 or later. You may also need a second dose. Meningococcal conjugate (MenACWY) vaccine  You may need this if you have certain conditions. Hepatitis A vaccine  You may need this if you have certain conditions or if you travel or work in places where you may be exposed  to hepatitis A. Hepatitis B vaccine  You may need this if you have certain conditions or if you travel or work in places where you may be exposed to hepatitis B. Haemophilus influenzae type b (Hib) vaccine  You may need this if you have certain conditions. You may receive vaccines as individual doses or as more than one vaccine together in one shot (combination vaccines). Talk with your health care provider about the risks and benefits of combination vaccines. What tests do I need? Blood tests  Lipid and cholesterol levels. These may be checked every 5 years, or more frequently depending on your overall health.  Hepatitis C test.  Hepatitis B test. Screening  Lung cancer screening. You may have this screening every year starting at age 39 if you have a 30-pack-year history of smoking and currently smoke or have quit within the past 15 years.  Colorectal cancer screening. All adults should have this screening starting at age 36 and continuing until age 15. Your health care provider may recommend screening at age 23 if you are at increased risk. You will have tests every 1-10 years, depending on your results and the type of screening test.  Diabetes screening. This is done by checking your blood sugar (glucose) after you have not eaten for a while (fasting). You may have this done every 1-3 years.  Mammogram. This may be done every 1-2 years. Talk with your health care provider about how often you should have regular mammograms.  BRCA-related cancer screening. This may be done if you have a family history of breast, ovarian, tubal, or peritoneal cancers.  Other tests  Sexually transmitted disease (STD) testing.  Bone density scan. This is done to screen for osteoporosis. You may have this done starting at age 55. Follow these instructions at home: Eating and drinking  Eat a diet that includes fresh fruits and vegetables, whole grains, lean protein, and low-fat dairy products. Limit  your intake of foods with high amounts of sugar, saturated fats, and salt.  Take vitamin and mineral supplements as recommended by your health care provider.  Do not drink alcohol if your health care provider tells you not to drink.  If you drink alcohol: ? Limit how much you have to 0-1 drink a day. ? Be aware of how much alcohol is in your drink. In the [place] one drink equals one 12 oz bottle of beer (355 mL), one 5 oz glass of wine (148 mL), or one 1 oz glass of hard liquor (44 mL). Lifestyle  Take daily care of your teeth and gums.  Stay active. Exercise for at least 30 minutes on 5 or more days each week.  Do not use any products that contain nicotine or tobacco, such as cigarettes, e-cigarettes, and chewing tobacco. If you need help quitting, ask your health care provider.  If you are sexually active, practice safe sex. Use a condom or other form of protection in order to prevent STIs (sexually transmitted infections).  Talk with your health care provider about taking a low-dose aspirin or statin. What's next?  Go to your health care provider once a year for a well check visit.  Ask your health care provider how often you should have your eyes and teeth checked.  Stay up to date on all vaccines. This information is not intended to replace advice given to you by your health care provider. Make sure you discuss any questions you have with your health care provider. Document Released: 05/27/2015 Document Revised: 04/24/2018 Document Reviewed: 04/24/2018 Elsevier Patient Education  2020 [employer].

## 2019-01-05 NOTE — Progress Notes (Signed)
MEDICARE ANNUAL WELLNESS VISIT  01/05/2019  Telephone Visit Disclaimer This Medicare AWV was conducted by telephone due to national recommendations for restrictions regarding the COVID-19 Pandemic (e.g. social distancing).  I verified, using two identifiers, that I am speaking with Cheryl Lee or their authorized healthcare agent. I discussed the limitations, risks, security, and privacy concerns of performing an evaluation and management service by telephone and the potential availability of an in-person appointment in the future. The patient expressed understanding and agreed to proceed.   Subjective:  Cheryl Lee is a 73 y.o. female patient of Terald Sleeper, PA-C who had a Medicare Annual Wellness Visit today via telephone. Cheryl Lee is Retired and lives alone. she has 1 child. she reports that she is socially active and does interact with friends/family regularly. she is minimally physically active and enjoys doing word search puzzles, crossword puzzles and watching TV.  Patient Care Team: Theodoro Clock as PCP - General (Physician Assistant)  Advanced Directives 01/05/2019 12/31/2017 06/11/2017 02/17/2017 02/15/2017 02/13/2017 02/28/2015  Does Patient Have a Medical Advance Directive? No No No No No No No  Would patient like information on creating a medical advance directive? No - Patient declined No - Patient declined No - Patient declined No - Patient declined - - Yes - Educational materials given    Hospital Utilization Over the Past 12 Months: # of hospitalizations or ER visits: 0 # of surgeries: 0  Review of Systems    Patient reports that her overall health is unchanged compared to last year.  Patient Reported Readings (BP, Pulse, CBG, Weight, etc) none  Review of Systems: History obtained from chart review  All other systems negative.  Pain Assessment Pain : No/denies pain     Current Medications & Allergies (verified) Allergies as of 01/05/2019    Reactions   Lycopene Itching, Rash      Medication List       Accurate as of January 05, 2019 10:27 AM. If you have any questions, ask your nurse or doctor.        alendronate 70 MG tablet Commonly known as: FOSAMAX Take 1 tablet (70 mg total) by mouth every Monday. Take with a full glass of water on an empty stomach.   aspirin 81 MG tablet Take 81 mg by mouth daily.   atorvastatin 10 MG tablet Commonly known as: LIPITOR Take 1 tablet (10 mg total) by mouth daily.   fluticasone 50 MCG/ACT nasal spray Commonly known as: FLONASE Place 2 sprays into both nostrils daily.   ibuprofen 200 MG tablet Commonly known as: ADVIL Take 200 mg by mouth every 6 (six) hours as needed.   lisinopril-hydrochlorothiazide 20-12.5 MG tablet Commonly known as: ZESTORETIC Take 0.5 tablets by mouth daily.   multivitamins ther. w/minerals Tabs tablet Take 0.5 tablets by mouth 2 (two) times daily.   omeprazole 20 MG capsule Commonly known as: PRILOSEC Take 1 capsule (20 mg total) by mouth daily.   vitamin C 500 MG tablet Commonly known as: ASCORBIC ACID Take 500 mg by mouth daily.       History (reviewed): Past Medical History:  Diagnosis Date  . Acid reflux   . Arthritis   . HNP (herniated nucleus pulposus), lumbar   . Hypercholesteremia   . Hypertension   . Osteoporosis   . Sinus congestion    Past Surgical History:  Procedure Laterality Date  . CATARACT EXTRACTION    . COLONOSCOPY    . LUMBAR LAMINECTOMY/DECOMPRESSION  MICRODISCECTOMY Left 05/01/2017   Procedure: Microdiscectomy - Lumbar four-five  - left;  Surgeon: Kary Kos, MD;  Location: [facility];  Service: Neurosurgery;  Laterality: Left;  . Right snkle repair     Family History  Problem Relation Age of Onset  . Cancer Mother   . Alzheimer's disease Mother   . Heart disease Mother   . Early death Father   . Diabetes Sister   . Stroke Brother   . Diabetes Sister   . Cancer Sister 71       uterine  . Hypertension  Sister   . Healthy Daughter    Social History   Socioeconomic History  . Marital status: Divorced    Spouse name: Not on file  . Number of children: 1  . Years of education: 24  . Highest education level: 12th grade  Occupational History  . Occupation: retired  Scientific laboratory technician  . Financial resource strain: Not hard at all  . Food insecurity    Worry: Never true    Inability: Never true  . Transportation needs    Medical: No    Non-medical: No  Tobacco Use  . Smoking status: Never Smoker  . Smokeless tobacco: Never Used  Substance and Sexual Activity  . Alcohol use: No  . Drug use: No  . Sexual activity: Not Currently  Lifestyle  . Physical activity    Days per week: 7 days    Minutes per session: 30 min  . Stress: Not at all  Relationships  . Social connections    Talks on phone: More than three times a week    Gets together: More than three times a week    Attends religious service: More than 4 times per year    Active member of club or organization: Yes    Attends meetings of clubs or organizations: More than 4 times per year    Relationship status: Divorced  Other Topics Concern  . Not on file  Social History Narrative  . Not on file    Activities of Daily Living In your present state of health, do you have any difficulty performing the following activities: 01/05/2019  Hearing? N  Vision? Y  Comment blind in the right eye-does have her yearly eye exam  Difficulty concentrating or making decisions? N  Walking or climbing stairs? Y  Comment pt doesn't use stairs, she is scared she might fall  Dressing or bathing? N  Doing errands, shopping? N  Preparing Food and eating ? N  Using the Toilet? N  In the past six months, have you accidently leaked urine? N  Do you have problems with loss of bowel control? N  Managing your Medications? N  Managing your Finances? N  Housekeeping or managing your Housekeeping? N  Some recent data might be hidden    Patient  Education/ Literacy How often do you need to have someone help you when you read instructions, pamphlets, or other written materials from your doctor or pharmacy?: 1 - Never What is the last grade level you completed in school?: 12th grade  Exercise Current Exercise Habits: Home exercise routine, Type of exercise: walking, Time (Minutes): 30, Frequency (Times/Week): 7, Weekly Exercise (Minutes/Week): 210, Intensity: Mild, Exercise limited by: orthopedic condition(s)  Diet Patient reports consuming 3 meals a day and 2 snack(s) a day Patient reports that her primary diet is: Regular Patient reports that she does have regular access to food.   Depression Screen PHQ 2/9 Scores 01/05/2019 01/05/2019  07/07/2018 03/03/2018 12/31/2017 08/27/2017 02/11/2017  PHQ - 2 Score 0 0 0 0 0 0 0     Fall Risk Fall Risk  01/05/2019 01/05/2019 07/07/2018 03/03/2018 12/31/2017  Falls in the past year? 0 0 0 No No  Number falls in past yr: 0 - - - -  Injury with Fall? 0 - - - -  Risk Factor Category  - - - - -  Risk for fall due to : Impaired balance/gait - - - -  Follow up Falls prevention discussed - - - -  Comment no throw rugs in the house, has adequate lighting in walkways and grabrails in the bathroom - - - -     Objective:  Cheryl Lee seemed alert and oriented and she participated appropriately during our telephone visit.  Blood Pressure Weight BMI  BP Readings from Last 3 Encounters:  01/05/19 114/77  07/07/18 127/83  03/03/18 115/79   Wt Readings from Last 3 Encounters:  01/05/19 145 lb 3.2 oz (65.9 kg)  07/07/18 145 lb 12.8 oz (66.1 kg)  03/03/18 143 lb (64.9 kg)   BMI Readings from Last 1 Encounters:  01/05/19 29.33 kg/m    *Unable to obtain current vital signs, weight, and BMI due to telephone visit type  Hearing/Vision  . Bernyce did not seem to have difficulty with hearing/understanding during the telephone conversation . Reports that she has had a formal eye exam by an eye care  professional within the past year . Reports that she has not had a formal hearing evaluation within the past year *Unable to fully assess hearing and vision during telephone visit type  Cognitive Function: 6CIT Screen 01/05/2019  What Year? 0 points  What month? 0 points  What time? 0 points  Count back from 20 0 points  Months in reverse 2 points  Repeat phrase 0 points  Total Score 2   (Normal:0-7, Significant for Dysfunction: >8)  Normal Cognitive Function Screening: Yes   Immunization & Health Maintenance Record Immunization History  Administered Date(s) Administered  . Pneumococcal Conjugate-13 09/03/2014  . Pneumococcal Polysaccharide-23 08/30/2012  . Tdap 01/01/2012  . Zoster 01/01/2012    Health Maintenance  Topic Date Due  . COLONOSCOPY  08/13/2018  . INFLUENZA VACCINE  12/13/2018  . MAMMOGRAM  10/18/2019  . TETANUS/TDAP  12/31/2021  . DEXA SCAN  Completed  . Hepatitis C Screening  Completed  . PNA vac Low Risk Adult  Completed       Assessment  This is a routine wellness examination for Cheryl Lee.  Health Maintenance: Due or Overdue Health Maintenance Due  Topic Date Due  . COLONOSCOPY  08/13/2018  . INFLUENZA VACCINE  12/13/2018    Cheryl Lee does not need a referral for Community Assistance: Care Management:   no Social Work:    no Prescription Assistance:  no Nutrition/Diabetes Education:  no   Plan:  Personalized Goals Goals Addressed            This Visit's Progress   . DIET - INCREASE WATER INTAKE       Try to drink 6-8 glasses of water daily.      Personalized Health Maintenance & Screening Recommendations  Influenza vaccine Colorectal cancer screening  Lung Cancer Screening Recommended: no (Low Dose CT Chest recommended if Age 16-80 years, 30 pack-year currently smoking OR have quit w/in past 15 years) Hepatitis C Screening recommended: no HIV Screening recommended: no  Advanced Directives: Written information  was not prepared  per patient's request.  Referrals & Orders No orders of the defined types were placed in this encounter.   Follow-up Plan . Follow-up with Terald Sleeper, PA-C as planned . Get your Flu shot at your next visit with your PCP . Let us know when you will have transportation for Colonoscopy so we can put referral in   I have personally reviewed and noted the following in the patient's chart:   . Medical and social history . Use of alcohol, tobacco or illicit drugs  . Current medications and supplements . Functional ability and status . Nutritional status . Physical activity . Advanced directives . List of other physicians . Hospitalizations, surgeries, and ER visits in previous 12 months . Vitals . Screenings to include cognitive, depression, and falls . Referrals and appointments  In addition, I have reviewed and discussed with Cheryl Lee certain preventive protocols, quality metrics, and best practice recommendations. A written personalized care plan for preventive services as well as general preventive health recommendations is available and can be mailed to the patient at her request.      Marylin Crosby, LPN  [id number]

## 2019-01-06 LAB — TSH: TSH: 4.16 u[IU]/mL (ref 0.450–4.500)

## 2019-01-06 LAB — CMP14+EGFR
ALT: 14 IU/L (ref 0–32)
AST: 19 IU/L (ref 0–40)
Albumin/Globulin Ratio: 1.8 (ref 1.2–2.2)
Albumin: 4.2 g/dL (ref 3.7–4.7)
Alkaline Phosphatase: 97 IU/L (ref 39–117)
BUN/Creatinine Ratio: 16 (ref 12–28)
BUN: 19 mg/dL (ref 8–27)
Bilirubin Total: 0.5 mg/dL (ref 0.0–1.2)
CO2: 22 mmol/L (ref 20–29)
Calcium: 9.8 mg/dL (ref 8.7–10.3)
Chloride: 101 mmol/L (ref 96–106)
Creatinine, Ser: 1.16 mg/dL — ABNORMAL HIGH (ref 0.57–1.00)
GFR calc Af Amer: 54 mL/min/{1.73_m2} — ABNORMAL LOW (ref >59)
GFR calc non Af Amer: 47 mL/min/{1.73_m2} — ABNORMAL LOW (ref >59)
Globulin, Total: 2.3 g/dL (ref 1.5–4.5)
Glucose: 102 mg/dL — ABNORMAL HIGH (ref 65–99)
Potassium: 4.5 mmol/L (ref 3.5–5.2)
Sodium: 140 mmol/L (ref 134–144)
Total Protein: 6.5 g/dL (ref 6.0–8.5)

## 2019-01-06 LAB — CBC WITH DIFFERENTIAL/PLATELET
Basophils Absolute: 0.1 10*3/uL (ref 0.0–0.2)
Basos: 1 %
EOS (ABSOLUTE): 0.2 10*3/uL (ref 0.0–0.4)
Eos: 2 %
Hematocrit: 43 % (ref 34.0–46.6)
Hemoglobin: 14.3 g/dL (ref 11.1–15.9)
Immature Grans (Abs): 0 10*3/uL (ref 0.0–0.1)
Immature Granulocytes: 0 %
Lymphocytes Absolute: 2.3 10*3/uL (ref 0.7–3.1)
Lymphs: 24 %
MCH: 32 pg (ref 26.6–33.0)
MCHC: 33.3 g/dL (ref 31.5–35.7)
MCV: 96 fL (ref 79–97)
Monocytes Absolute: 0.5 10*3/uL (ref 0.1–0.9)
Monocytes: 6 %
Neutrophils Absolute: 6.7 10*3/uL (ref 1.4–7.0)
Neutrophils: 67 %
Platelets: 334 10*3/uL (ref 150–450)
RBC: 4.47 x10E6/uL (ref 3.77–5.28)
RDW: 12.2 % (ref 11.7–15.4)
WBC: 9.8 10*3/uL (ref 3.4–10.8)

## 2019-01-06 LAB — LIPID PANEL
Chol/HDL Ratio: 3.6 ratio (ref 0.0–4.4)
Cholesterol, Total: 188 mg/dL (ref 100–199)
HDL: 52 mg/dL (ref >39)
LDL Calculated: 103 mg/dL — ABNORMAL HIGH (ref 0–99)
Triglycerides: 165 mg/dL — ABNORMAL HIGH (ref 0–149)
VLDL Cholesterol Cal: 33 mg/dL (ref 5–40)

## 2019-01-06 LAB — VITAMIN D 25 HYDROXY (VIT D DEFICIENCY, FRACTURES): Vit D, 25-Hydroxy: 35.2 ng/mL (ref 30.0–100.0)

## 2019-01-09 ENCOUNTER — Other Ambulatory Visit: Payer: Self-pay | Admitting: Physician Assistant

## 2019-01-09 DIAGNOSIS — I10 Essential (primary) hypertension: Secondary | ICD-10-CM

## 2019-01-27 ENCOUNTER — Other Ambulatory Visit: Payer: Medicare Other

## 2019-01-27 ENCOUNTER — Other Ambulatory Visit: Payer: Self-pay

## 2019-01-27 DIAGNOSIS — I10 Essential (primary) hypertension: Secondary | ICD-10-CM | POA: Diagnosis not present

## 2019-01-28 LAB — BMP8+EGFR
BUN/Creatinine Ratio: 20 (ref 12–28)
BUN: 18 mg/dL (ref 8–27)
CO2: 24 mmol/L (ref 20–29)
Calcium: 9.7 mg/dL (ref 8.7–10.3)
Chloride: 103 mmol/L (ref 96–106)
Creatinine, Ser: 0.92 mg/dL (ref 0.57–1.00)
GFR calc Af Amer: 72 mL/min/{1.73_m2} (ref >59)
GFR calc non Af Amer: 62 mL/min/{1.73_m2} (ref >59)
Glucose: 89 mg/dL (ref 65–99)
Potassium: 4.5 mmol/L (ref 3.5–5.2)
Sodium: 142 mmol/L (ref 134–144)

## 2019-01-29 LAB — MICROALBUMIN / CREATININE URINE RATIO
Creatinine, Urine: 108.4 mg/dL
Microalb/Creat Ratio: 13 mg/g creat (ref 0–29)
Microalbumin, Urine: 14 ug/mL

## 2019-01-31 IMAGING — MR MR LUMBAR SPINE W/O CM
4 of 7 series · 12 of 48 positions shown · non-contrast
Comparison: Lumbar radiographs 02/11/2017.

CLINICAL DATA: 70-year-old female with 1 week of lumbar back pain
radiating to the left leg with no known injury.

EXAM:
MRI LUMBAR SPINE WITHOUT CONTRAST
TECHNIQUE: Multiplanar, multisequence MR imaging of the lumbar spine was
performed. No intravenous contrast was administered.

[Series 3: T2 · sagittal · 4.0mm · 0.42mm/px · 4 of 13 slices shown (1 of 4)]
[im 1/13]
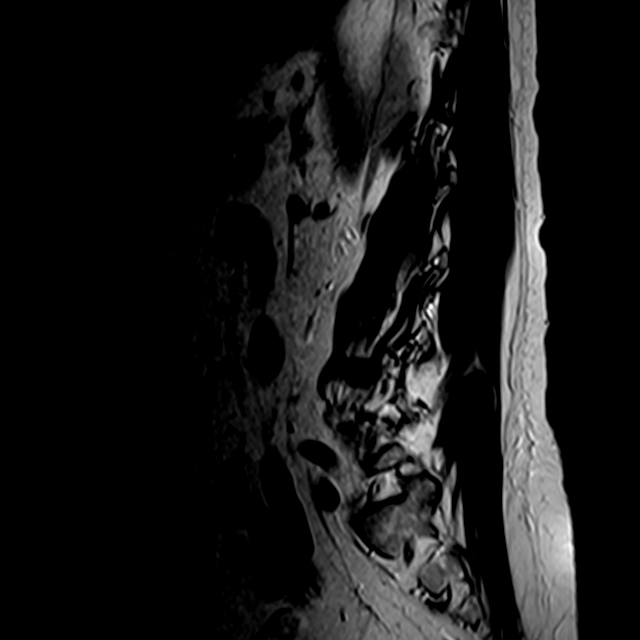
[im 4/13]
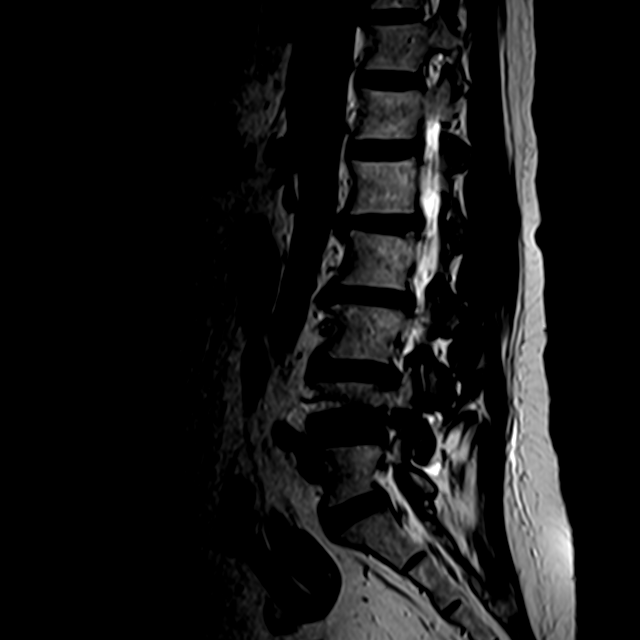
[im 7/13]
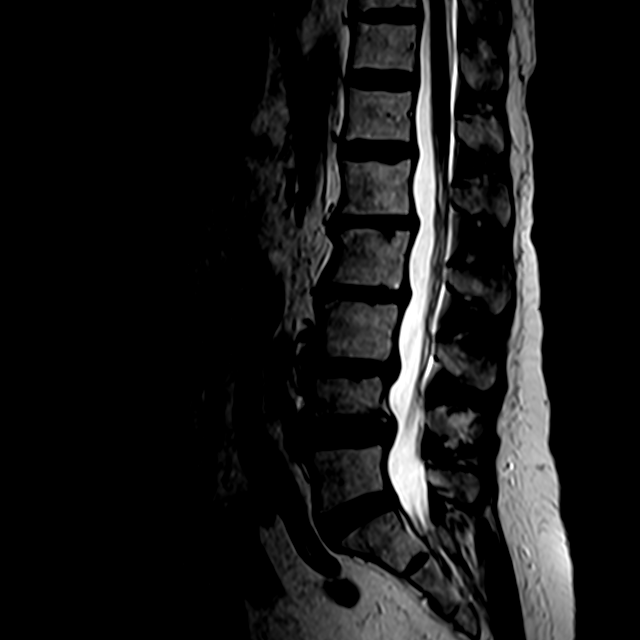
[im 13/13]
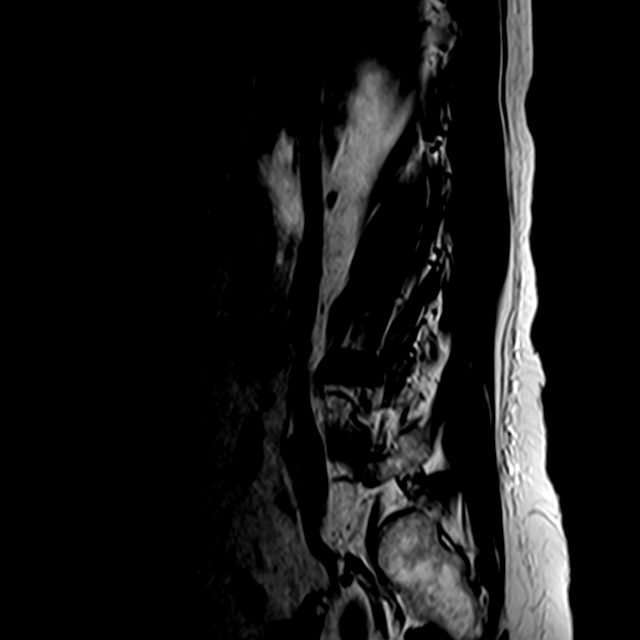

[Series 5: T2 · sagittal · 4.0mm · 0.42mm/px · 3 of 16 slices shown (2 of 4)]
[im 4/16]
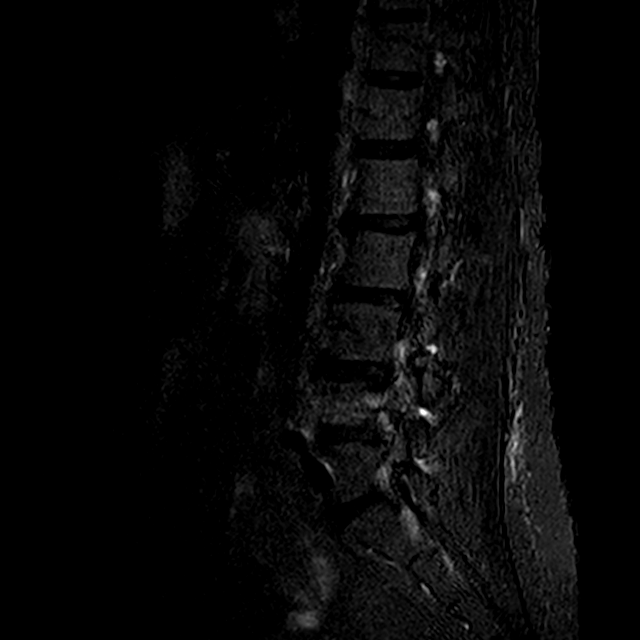
[im 10/16]
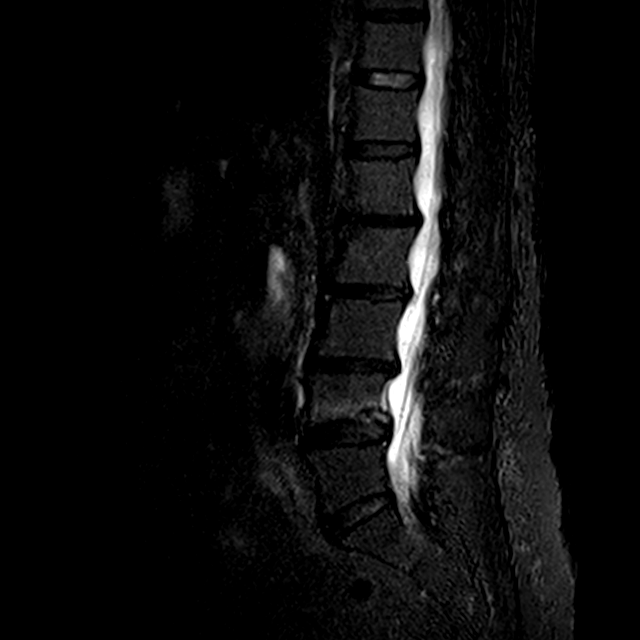
[im 16/16]
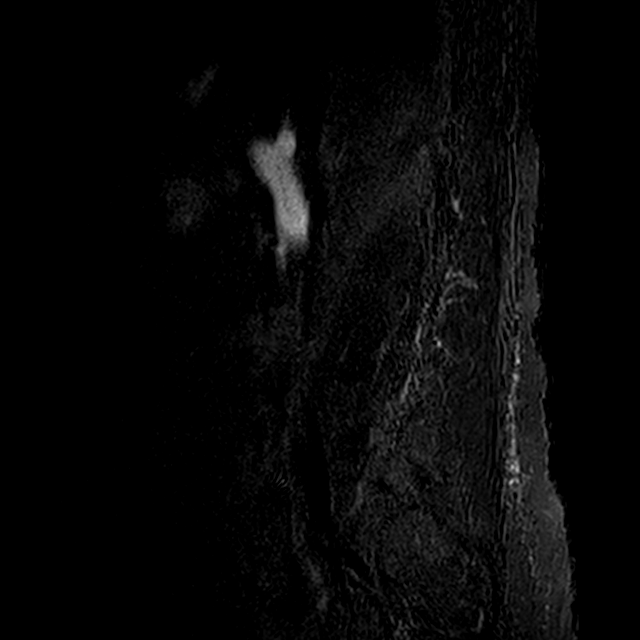

[Series 6: T2 · axial · 4.0mm · 0.25mm/px · z∈[-98,+39]mm · 3 of 37 slices shown (3 of 4)]
[im 6/37]
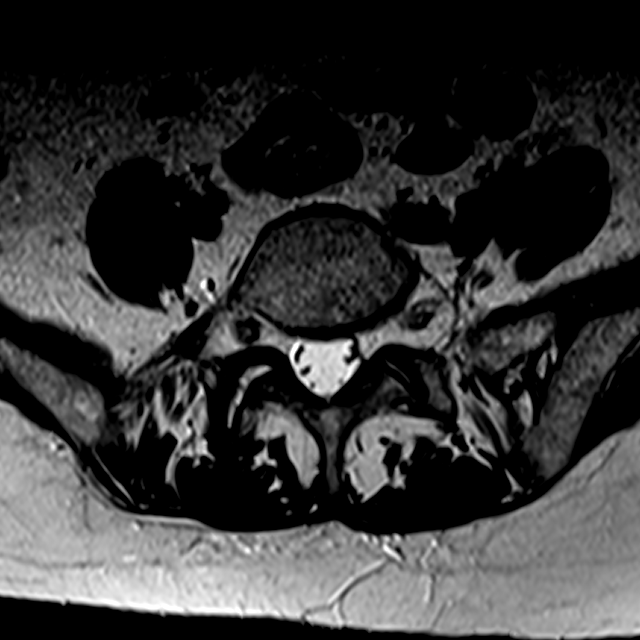
[im 20/37]
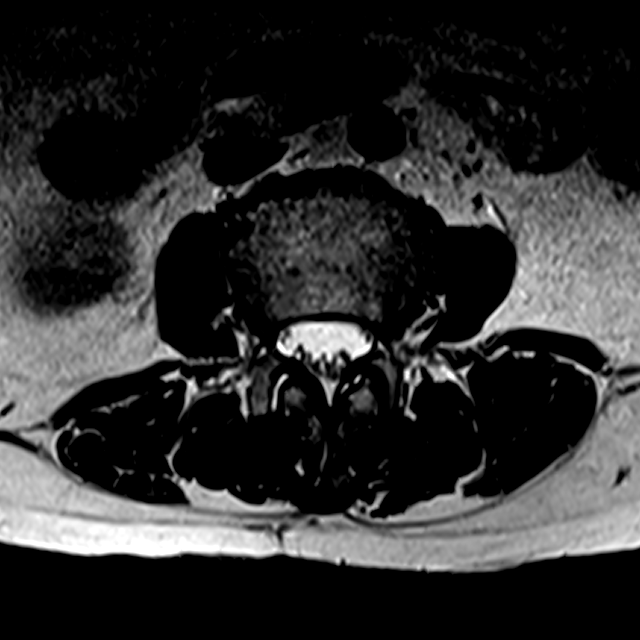
[im 31/37]
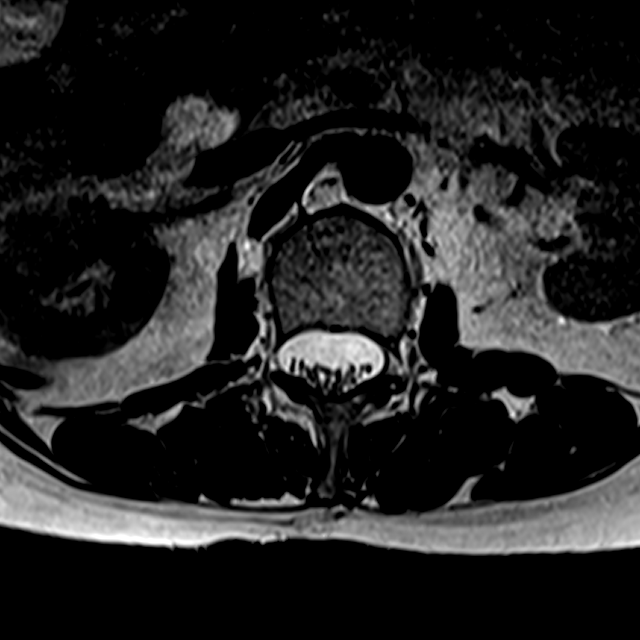

[Series 8: T2 · axial · 4.0mm · 0.25mm/px · z∈[+104,+121]mm · 2 of 5 slices shown (4 of 4)]
[im 1/5]
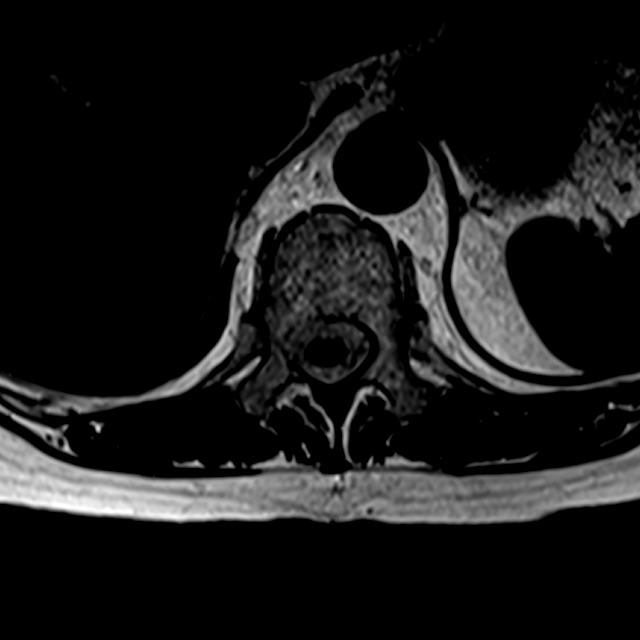
[im 5/5]
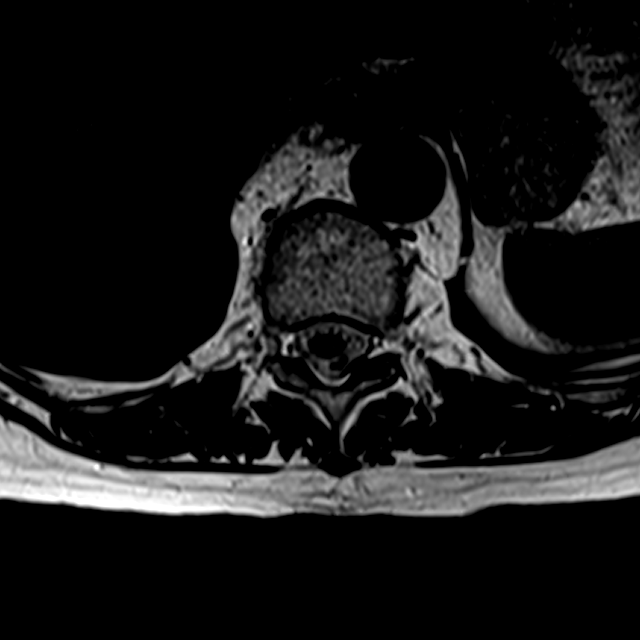

[12 of 48 positions shown; findings below may reference images not displayed]

FINDINGS: Segmentation:  Normal as demonstrated on the comparison radiographs.

Alignment: Mild levoconvex lumbar spine curvature. Straightening of
lumbar lordosis. Mild anterolisthesis of L1 on L2 and retrolisthesis
of L3 on L4.

Vertebrae: Mild L4 vertebral body loss of height with inferior
endplate deformity demonstrating mild to moderate inferior endplate
marrow edema. 25-30% loss of vertebral body height. Mild
retropulsion of the posterior inferior endplate. Marrow edema in the
left L4 pedicle. The other posterior elements appear intact.

Elsewhere Visualized bone marrow signal is within normal limits. No
other marrow edema or acute osseous abnormality. Intact visible
sacrum and SI joints.

Conus medullaris: Extends to the T12-L1 level and appears normal.

Paraspinal and other soft tissues: Mild edema within the medial left
psoas muscle at the L4 vertebral level.

Partially visible common bile duct enlargement at the porta hepatis
and tracking to the duodenum (series 6, image 2) estimated at up to
11 mm. The duct appears to taper to the papilla. The gallbladder
appears to be present.

Other visible abdominal viscera and paraspinal soft tissues are
within normal limits.

Disc levels:

Lumbar disc space loss, circumferential disc bulging, and posterior
element hypertrophy.

At L4-L5 there is leftward disc bulging and focal left subarticular
disc protrusion (series 3, image 9 and series 6, image 25)
superimposed on moderate facet and ligament flavum hypertrophy and
mild L4 inferior endplate retropulsion. Moderate to severe proximal
left L4 neural foraminal stenosis. Up to moderate left lateral
recess stenosis (descending left L5 nerve root level).
IMPRESSION: 1. Acute to subacute mild L4 compression fracture with inferior
endplate deformity, mild bony retropulsion, and superimposed
leftward disc disease combining for moderate to severe left neural
foraminal and left lateral recess stenosis. Query left L4 and/or L5
radiculitis.
2. Other lumbar spine degeneration is typical for age.
3. Partially visible extrahepatic biliary ductal enlargement of
unknown significance. Query hyperbilirubinemia.

## 2019-07-08 ENCOUNTER — Ambulatory Visit (INDEPENDENT_AMBULATORY_CARE_PROVIDER_SITE_OTHER): Payer: Medicare Other | Admitting: Physician Assistant

## 2019-07-08 ENCOUNTER — Encounter: Payer: Self-pay | Admitting: Physician Assistant

## 2019-07-08 DIAGNOSIS — K219 Gastro-esophageal reflux disease without esophagitis: Secondary | ICD-10-CM

## 2019-07-08 DIAGNOSIS — I10 Essential (primary) hypertension: Secondary | ICD-10-CM

## 2019-07-08 DIAGNOSIS — E785 Hyperlipidemia, unspecified: Secondary | ICD-10-CM

## 2019-07-08 DIAGNOSIS — Z1211 Encounter for screening for malignant neoplasm of colon: Secondary | ICD-10-CM | POA: Diagnosis not present

## 2019-07-08 MED ORDER — FLUTICASONE PROPIONATE 50 MCG/ACT NA SUSP: 2.0000 | Freq: Every day | NASAL | 1 refills | Status: DC

## 2019-07-08 MED ORDER — OMEPRAZOLE 20 MG PO CPDR: 20.0000 mg | DELAYED_RELEASE_CAPSULE | Freq: Every day | ORAL | 1 refills | Status: DC

## 2019-07-08 MED ORDER — LISINOPRIL-HYDROCHLOROTHIAZIDE 20-12.5 MG PO TABS: 0.5000 | ORAL_TABLET | Freq: Every day | ORAL | 1 refills | Status: DC

## 2019-07-08 MED ORDER — ATORVASTATIN CALCIUM 10 MG PO TABS: 10.0000 mg | ORAL_TABLET | Freq: Every day | ORAL | 1 refills | Status: DC

## 2019-07-08 NOTE — Progress Notes (Signed)
906          Telephone visit  Subjective: [id number] chronic conditions PCP: Terald Sleeper, PA-C OLM:[id number] A Mattson is a 74 y.o. female calls for telephone consult today. Patient provides verbal consent for consult held via phone.  Patient is identified with 2 separate identifiers.  At this time the entire area is on COVID-19 social distancing and stay home orders are in place.  Patient is of higher risk and therefore we are performing this by a virtual method.  Location of patient: home Location of provider: HOME Others present for call: no  This patient's MRI and periodic follow-up on her chronic medical conditions.  She was asked about having the flu vaccine and she declines.  She had a grandmother that passed away from it and she has never taken 1.  She also does not think that she will get a Covid vaccine even though we discussed that.  At this time she is staying home and safe.  Her daughter is doing for shopping.  She denies having any falls this year.  She states that her depression overall is doing well.  Not having any difficulty at this time.  She did have a colonoscopy in 2010.  She would like to go ahead and have it rescheduled with Dr. Laural Golden it has been 10 years.  There are no other complaints at this time.   ROS: Per HPI  Allergies  Allergen Reactions  . Lycopene Itching and Rash   Past Medical History:  Diagnosis Date  . Acid reflux   . Arthritis   . HNP (herniated nucleus pulposus), lumbar   . Hypercholesteremia   . Hypertension   . Osteoporosis   . Sinus congestion     Current Outpatient Medications:  .  alendronate (FOSAMAX) 70 MG tablet, Take 1 tablet (70 mg total) by mouth every Monday. Take with a full glass of water on an empty stomach., Disp: 13 tablet, Rfl: 3 .  aspirin 81 MG tablet, Take 81 mg by mouth daily., Disp: , Rfl:  .  atorvastatin (LIPITOR) 10 MG tablet, Take 1 tablet (10 mg total) by mouth daily., Disp: 90 tablet, Rfl: 1 .   fluticasone (FLONASE) 50 MCG/ACT nasal spray, Place 2 sprays into both nostrils daily., Disp: 48 g, Rfl: 1 .  ibuprofen (ADVIL,MOTRIN) 200 MG tablet, Take 200 mg by mouth every 6 (six) hours as needed., Disp: , Rfl:  .  lisinopril-hydrochlorothiazide (ZESTORETIC) 20-12.5 MG tablet, Take 0.5 tablets by mouth daily., Disp: 45 tablet, Rfl: 1 .  Multiple Vitamins-Minerals (MULTIVITAMINS THER. W/MINERALS) TABS, Take 0.5 tablets by mouth 2 (two) times daily. , Disp: , Rfl:  .  omeprazole (PRILOSEC) 20 MG capsule, Take 1 capsule (20 mg total) by mouth daily., Disp: 90 capsule, Rfl: 1 .  vitamin C (ASCORBIC ACID) 500 MG tablet, Take 500 mg by mouth daily.  , Disp: , Rfl:   Assessment/ Plan: 74 y.o. female   1. Hyperlipidemia with target LDL less than 100 - atorvastatin (LIPITOR) 10 MG tablet; Take 1 tablet (10 mg total) by mouth daily.  Dispense: 90 tablet; Refill: 1 - Lipid panel; Future - CMP14+EGFR; Future  2. Essential hypertension - lisinopril-hydrochlorothiazide (ZESTORETIC) 20-12.5 MG tablet; Take 0.5 tablets by mouth daily.  Dispense: 45 tablet; Refill: 1 - Lipid panel; Future - CMP14+EGFR; Future  3. Gastroesophageal reflux disease without esophagitis - omeprazole (PRILOSEC) 20 MG capsule; Take 1 capsule (20 mg total) by mouth daily.  Dispense: 90 capsule; Refill: 1  4. Colon cancer screening - Ambulatory referral to Gastroenterology   Return in about 6 months (around 01/05/2020).  Continue all other maintenance medications as listed above.  Start time: 9:06 AM End time: 9:17 AM  Meds ordered this encounter  Medications  . atorvastatin (LIPITOR) 10 MG tablet    Sig: Take 1 tablet (10 mg total) by mouth daily.    Dispense:  90 tablet    Refill:  1    Order Specific Question:   Supervising Provider    Answer:   Janora Norlander [[id number]]  . fluticasone (FLONASE) 50 MCG/ACT nasal spray    Sig: Place 2 sprays into both nostrils daily.    Dispense:  48 g    Refill:  1     Order Specific Question:   Supervising Provider    Answer:   Janora Norlander [[id number]]  . lisinopril-hydrochlorothiazide (ZESTORETIC) 20-12.5 MG tablet    Sig: Take 0.5 tablets by mouth daily.    Dispense:  45 tablet    Refill:  1    Order Specific Question:   Supervising Provider    Answer:   Janora Norlander [[id number]]  . omeprazole (PRILOSEC) 20 MG capsule    Sig: Take 1 capsule (20 mg total) by mouth daily.    Dispense:  90 capsule    Refill:  1    Order Specific Question:   Supervising Provider    Answer:   Janora Norlander [[id number]]    Particia Nearing PA-C [place] [phone]

## 2019-07-09 ENCOUNTER — Other Ambulatory Visit: Payer: Self-pay

## 2019-07-09 ENCOUNTER — Other Ambulatory Visit: Payer: Medicare Other

## 2019-07-09 DIAGNOSIS — E785 Hyperlipidemia, unspecified: Secondary | ICD-10-CM | POA: Diagnosis not present

## 2019-07-09 DIAGNOSIS — I10 Essential (primary) hypertension: Secondary | ICD-10-CM

## 2019-07-10 ENCOUNTER — Encounter (INDEPENDENT_AMBULATORY_CARE_PROVIDER_SITE_OTHER): Payer: Self-pay | Admitting: *Deleted

## 2019-07-10 LAB — CMP14+EGFR
ALT: 14 IU/L (ref 0–32)
AST: 20 IU/L (ref 0–40)
Albumin/Globulin Ratio: 1.8 (ref 1.2–2.2)
Albumin: 4.2 g/dL (ref 3.7–4.7)
Alkaline Phosphatase: 97 IU/L (ref 39–117)
BUN/Creatinine Ratio: 16 (ref 12–28)
BUN: 16 mg/dL (ref 8–27)
Bilirubin Total: 0.4 mg/dL (ref 0.0–1.2)
CO2: 22 mmol/L (ref 20–29)
Calcium: 9.6 mg/dL (ref 8.7–10.3)
Chloride: 103 mmol/L (ref 96–106)
Creatinine, Ser: 1.02 mg/dL — ABNORMAL HIGH (ref 0.57–1.00)
GFR calc Af Amer: 63 mL/min/{1.73_m2} (ref >59)
GFR calc non Af Amer: 55 mL/min/{1.73_m2} — ABNORMAL LOW (ref >59)
Globulin, Total: 2.4 g/dL (ref 1.5–4.5)
Glucose: 98 mg/dL (ref 65–99)
Potassium: 4.6 mmol/L (ref 3.5–5.2)
Sodium: 143 mmol/L (ref 134–144)
Total Protein: 6.6 g/dL (ref 6.0–8.5)

## 2019-07-10 LAB — LIPID PANEL
Chol/HDL Ratio: 3.4 ratio (ref 0.0–4.4)
Cholesterol, Total: 184 mg/dL (ref 100–199)
HDL: 54 mg/dL (ref >39)
LDL Chol Calc (NIH): 97 mg/dL (ref 0–99)
Triglycerides: 192 mg/dL — ABNORMAL HIGH (ref 0–149)
VLDL Cholesterol Cal: 33 mg/dL (ref 5–40)

## 2019-07-14 ENCOUNTER — Encounter (INDEPENDENT_AMBULATORY_CARE_PROVIDER_SITE_OTHER): Payer: Self-pay | Admitting: *Deleted

## 2019-07-31 ENCOUNTER — Telehealth: Payer: Self-pay | Admitting: Physician Assistant

## 2019-07-31 NOTE — Chronic Care Management (AMB) (Signed)
  Chronic Care Management   Note  07/31/2019 Name: Cheryl Lee MRN: [id number] DOB: 1945-05-22  Cheryl Lee is a 74 y.o. year old female who is a primary care patient of Terald Sleeper, PA-C. I reached out to Cheryl Lee by phone today in response to a referral sent by Ms. Cheryl Lee's health plan.     [name] was given information about Chronic Care Management services today including:  1. CCM service includes personalized support from designated clinical staff supervised by her physician, including individualized plan of care and coordination with other care providers 2. 24/7 contact phone numbers for assistance for urgent and routine care needs. 3. Service will only be billed when office clinical staff spend 20 minutes or more in a month to coordinate care. 4. Only one practitioner may furnish and bill the service in a calendar month. 5. The patient may stop CCM services at any time (effective at the end of the month) by phone call to the office staff. 6. The patient will be responsible for cost sharing (co-pay) of up to 20% of the service fee (after annual deductible is met).  Patient did not agree to enrollment in care management services and does not wish to consider at this time.  Follow up plan: The patient has been provided with contact information for the care management team and has been advised to call with any health related questions or concerns.   [place], [place] [address] Direct Dial: [phone] [email or web address]: [email or web address]

## 2019-08-14 IMAGING — US US EXTREM LOW VENOUS*L*
1 series · 13 of 24 positions shown · non-contrast
Comparison: None.

CLINICAL DATA: Lower extremity pain and edema

EXAM:
LEFT LOWER EXTREMITY VENOUS DUPLEX ULTRASOUND
TECHNIQUE: Gray-scale sonography with graded compression, as well as color
Doppler and duplex ultrasound were performed to evaluate the left
lower extremity deep venous system from the level of the common
femoral vein and including the common femoral, femoral, profunda
femoral, popliteal and calf veins including the posterior tibial,
peroneal and gastrocnemius veins when visible. The superficial great
saphenous vein was also interrogated. Spectral Doppler was utilized
to evaluate flow at rest and with distal augmentation maneuvers in
the common femoral, femoral and popliteal veins.

[Series 1: us extrem low venous*left* · 0.05mm/px · 13 of 52 slices shown]
[im 1/52]
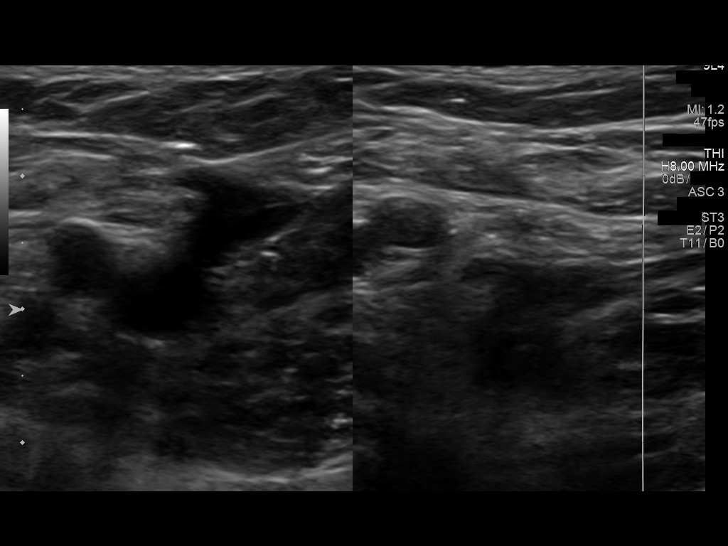
[im 5/52]
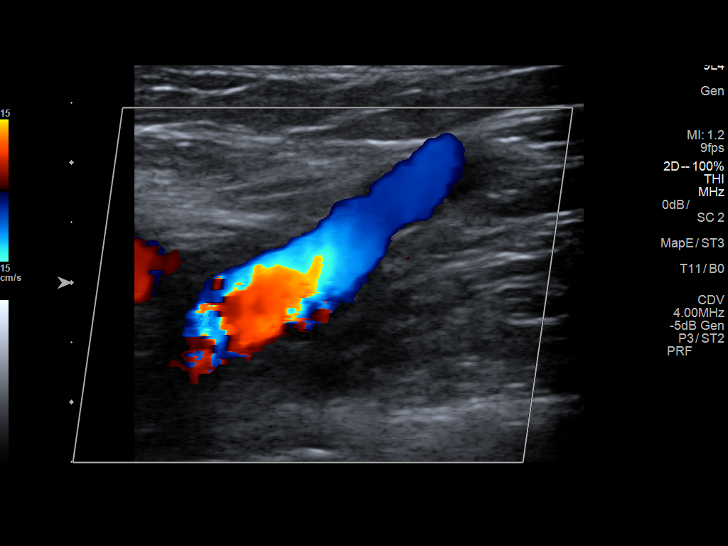
[im 9/52]
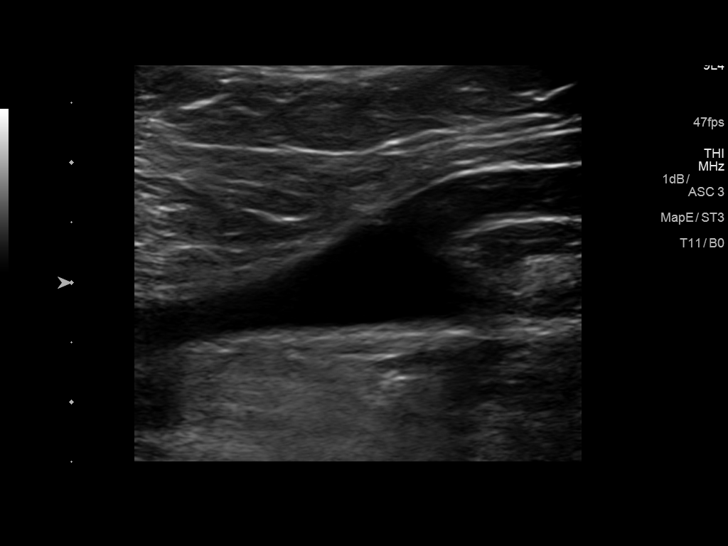
[im 14/52]
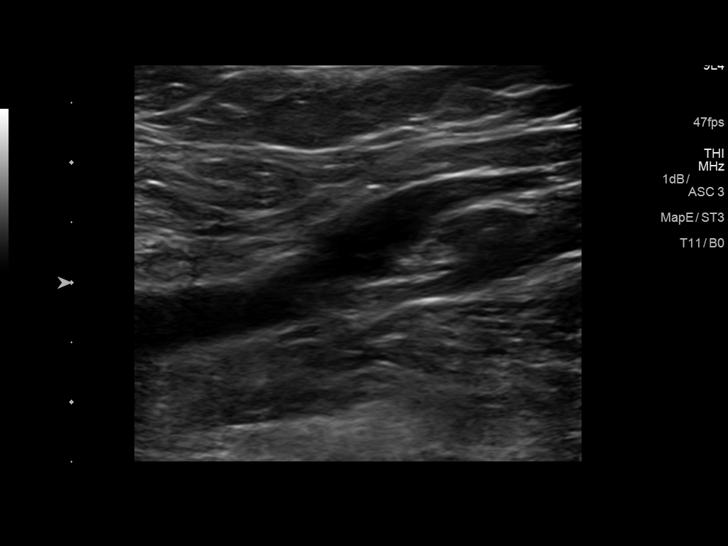
[im 18/52]
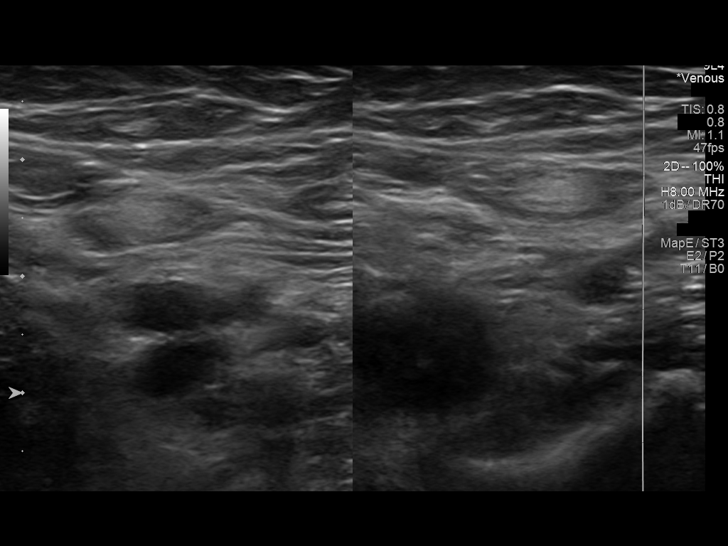
[im 23/52]
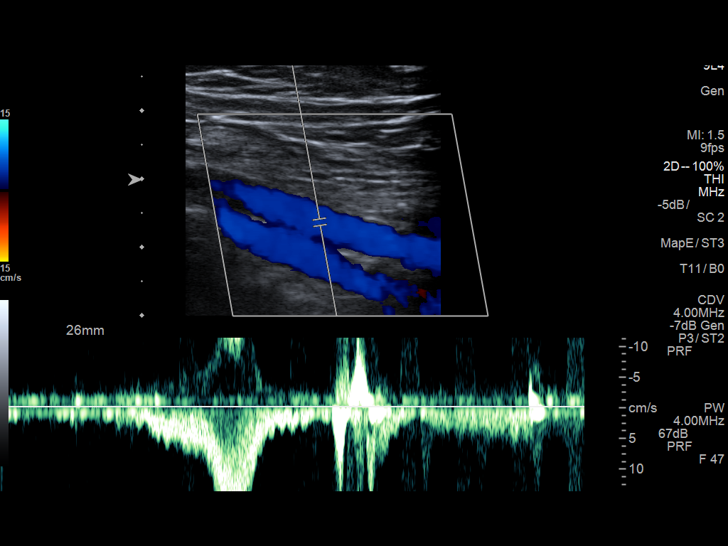
[im 27/52]
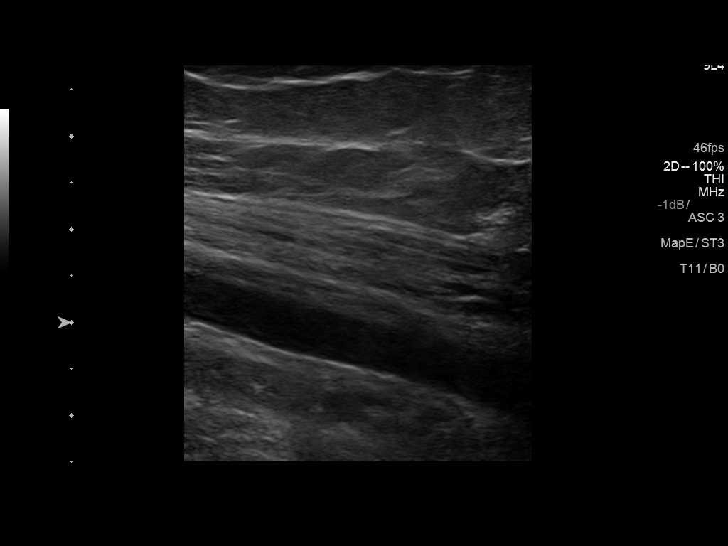
[im 29/52]
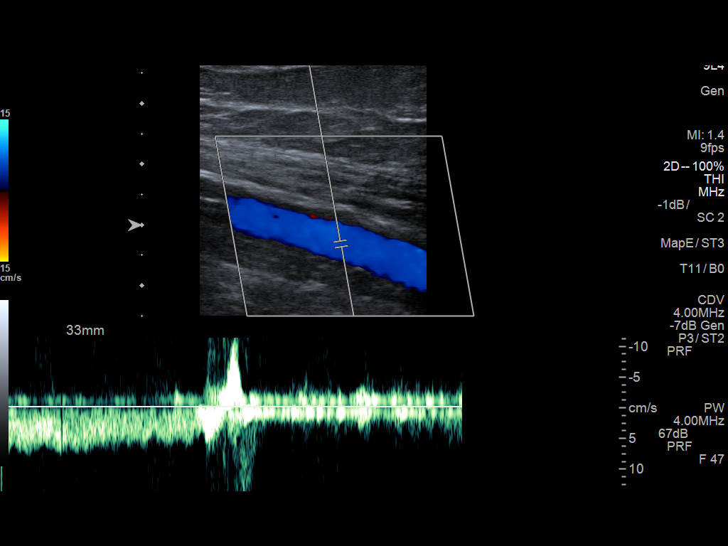
[im 34/52]
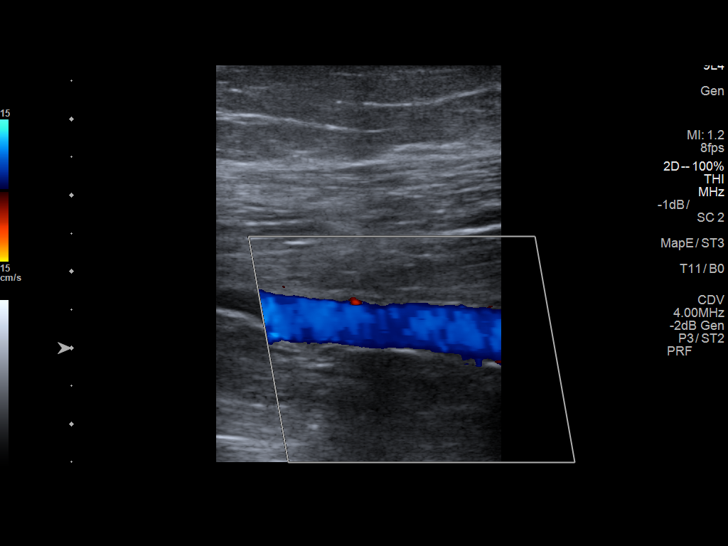
[im 38/52]
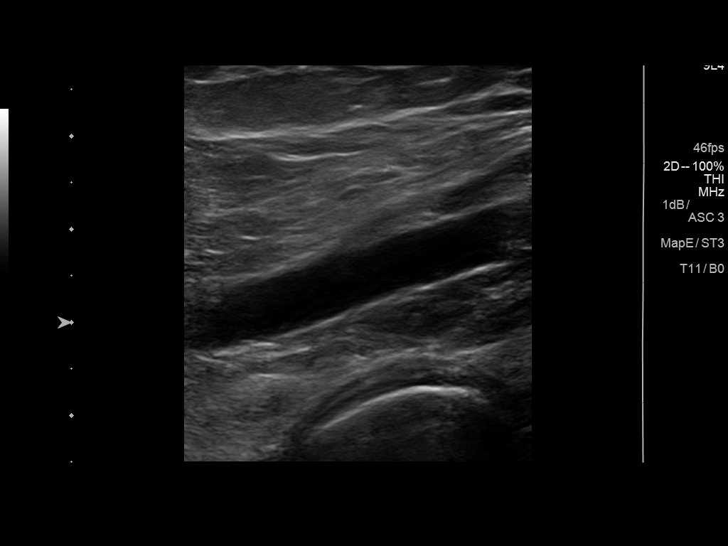
[im 43/52]
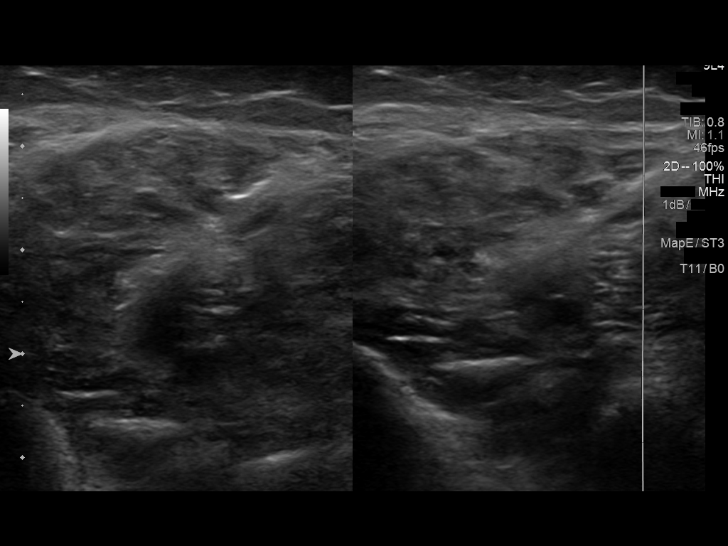
[im 47/52]
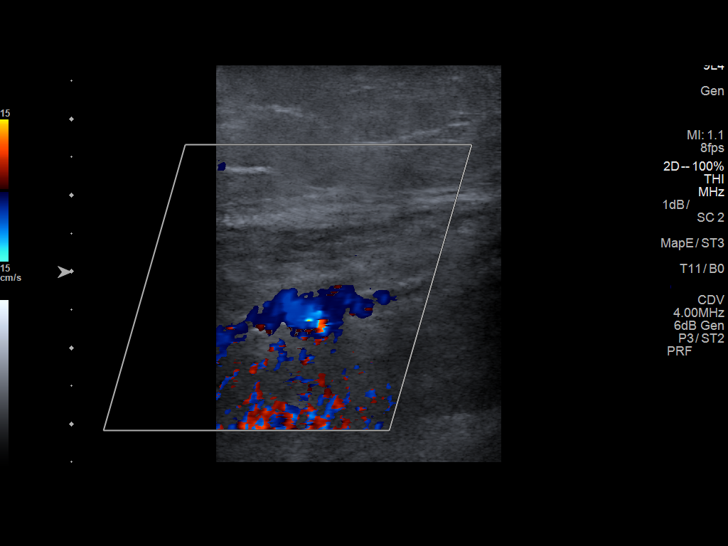
[im 52/52]
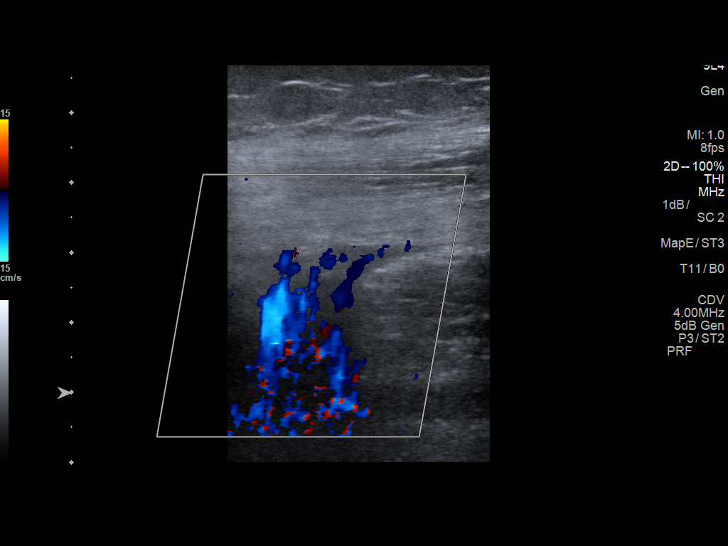

[13 of 24 positions shown; findings below may reference images not displayed]

FINDINGS: Contralateral Common Femoral Vein: Respiratory phasicity is normal
and symmetric with the symptomatic side. No evidence of thrombus.
Normal compressibility.

Common Femoral Vein: No evidence of thrombus. Normal
compressibility, respiratory phasicity and response to augmentation.

Saphenofemoral Junction: No evidence of thrombus. Normal
compressibility and flow on color Doppler imaging.

Profunda Femoral Vein: No evidence of thrombus. Normal
compressibility and flow on color Doppler imaging.

Femoral Vein: No evidence of thrombus. Normal compressibility,
respiratory phasicity and response to augmentation.

Popliteal Vein: No evidence of thrombus. Normal compressibility,
respiratory phasicity and response to augmentation.

Calf Veins: No evidence of thrombus. Normal compressibility and flow
on color Doppler imaging.

Superficial Great Saphenous Vein: No evidence of thrombus. Normal
compressibility and flow on color Doppler imaging.

Venous Reflux:  None.

Other Findings:  None.
IMPRESSION: No evidence of deep venous thrombosis in the left lower extremity.
Right common femoral vein also patent.

## 2019-09-09 ENCOUNTER — Telehealth (INDEPENDENT_AMBULATORY_CARE_PROVIDER_SITE_OTHER): Payer: Self-pay | Admitting: *Deleted

## 2019-09-09 NOTE — Telephone Encounter (Signed)
Referring MD/PCP: wfrm   Procedure: tcs  Reason/Indication:  screening  Has patient had this procedure before?  Yes, 2010  If so, when, by whom and where?    Is there a family history of colon cancer?  no  Who?  What age when diagnosed?    Is patient diabetic?   no      Does patient have prosthetic heart valve or mechanical valve?  no  Do you have a pacemaker/defibrillator?  no  Has patient ever had endocarditis/atrial fibrillation? no  Does patient use oxygen? no  Has patient had joint replacement within last 12 months?  no  Is patient constipated or do they take laxatives? no  Does patient have a history of alcohol/drug use?  no  Is patient on blood thinner such as Coumadin, Plavix and/or Aspirin? yes  Medications: asa 81 mg daily, alendronate 70 mg once a week, atorvastatin 10 mg daily, omeprazole 20 mg daily, fluticasone nasal spray, lisinopril/hctz 20/12.5 mg daily, mvi daily, ibuprofen bid, vit c daily  Allergies: lycopene  Medication Adjustment per Dr Laural Golden: asa 2 days  Procedure date & time:

## 2019-09-14 ENCOUNTER — Encounter: Payer: Self-pay | Admitting: *Deleted

## 2019-11-11 ENCOUNTER — Other Ambulatory Visit: Payer: Self-pay

## 2019-11-11 ENCOUNTER — Other Ambulatory Visit (INDEPENDENT_AMBULATORY_CARE_PROVIDER_SITE_OTHER): Payer: Self-pay | Admitting: *Deleted

## 2019-11-11 DIAGNOSIS — M810 Age-related osteoporosis without current pathological fracture: Secondary | ICD-10-CM

## 2019-11-11 MED ORDER — ALENDRONATE SODIUM 70 MG PO TABS: 70.0000 mg | ORAL_TABLET | ORAL | 1 refills | Status: DC

## 2019-11-12 ENCOUNTER — Other Ambulatory Visit (INDEPENDENT_AMBULATORY_CARE_PROVIDER_SITE_OTHER): Payer: Self-pay | Admitting: *Deleted

## 2019-11-12 DIAGNOSIS — Z1211 Encounter for screening for malignant neoplasm of colon: Secondary | ICD-10-CM

## 2019-12-15 ENCOUNTER — Encounter (INDEPENDENT_AMBULATORY_CARE_PROVIDER_SITE_OTHER): Payer: Self-pay | Admitting: *Deleted

## 2019-12-15 ENCOUNTER — Telehealth (INDEPENDENT_AMBULATORY_CARE_PROVIDER_SITE_OTHER): Payer: Self-pay | Admitting: *Deleted

## 2019-12-15 NOTE — Telephone Encounter (Signed)
Patient needs Plenvu (copay card) ° °

## 2019-12-16 MED ORDER — PLENVU 140 G PO SOLR: 1.0000 | Freq: Once | ORAL | 0 refills | Status: AC

## 2019-12-21 ENCOUNTER — Telehealth (INDEPENDENT_AMBULATORY_CARE_PROVIDER_SITE_OTHER): Payer: Self-pay | Admitting: *Deleted

## 2019-12-21 NOTE — Telephone Encounter (Signed)
Referring MD/PCP: wfrm   Procedure: tcs  Reason/Indication:  screening  Has patient had this procedure before?  Yes, 2010             If so, when, by whom and where?    Is there a family history of colon cancer?  no             Who?  What age when diagnosed?    Is patient diabetic?   no                                                  Does patient have prosthetic heart valve or mechanical valve?  no  Do you have a pacemaker/defibrillator?  no  Has patient ever had endocarditis/atrial fibrillation? no  Does patient use oxygen? no  Has patient had joint replacement within last 12 months?  no  Is patient constipated or do they take laxatives? no  Does patient have a history of alcohol/drug use?  no  Is patient on blood thinner such as Coumadin, Plavix and/or Aspirin? yes  Medications: asa 81 mg daily, alendronate 70 mg once a week, atorvastatin 10 mg daily, omeprazole 20 mg daily, fluticasone nasal spray, lisinopril/hctz 20/12.5 mg daily, mvi daily, ibuprofen bid, vit c daily  Allergies: lycopene  Medication Adjustment per Dr Laural Golden: asa 2 days  Procedure date & time: 01/21/20

## 2020-01-04 ENCOUNTER — Ambulatory Visit (INDEPENDENT_AMBULATORY_CARE_PROVIDER_SITE_OTHER): Payer: Medicare Other | Admitting: Family Medicine

## 2020-01-04 ENCOUNTER — Ambulatory Visit: Payer: Medicare Other | Admitting: Physician Assistant

## 2020-01-04 ENCOUNTER — Encounter: Payer: Self-pay | Admitting: Family Medicine

## 2020-01-04 ENCOUNTER — Other Ambulatory Visit: Payer: Self-pay

## 2020-01-04 DIAGNOSIS — M810 Age-related osteoporosis without current pathological fracture: Secondary | ICD-10-CM | POA: Diagnosis not present

## 2020-01-04 DIAGNOSIS — E785 Hyperlipidemia, unspecified: Secondary | ICD-10-CM | POA: Diagnosis not present

## 2020-01-04 DIAGNOSIS — K219 Gastro-esophageal reflux disease without esophagitis: Secondary | ICD-10-CM | POA: Diagnosis not present

## 2020-01-04 DIAGNOSIS — I10 Essential (primary) hypertension: Secondary | ICD-10-CM | POA: Diagnosis not present

## 2020-01-04 MED ORDER — OMEPRAZOLE 20 MG PO CPDR: 20.0000 mg | DELAYED_RELEASE_CAPSULE | Freq: Every day | ORAL | 3 refills | Status: DC

## 2020-01-04 MED ORDER — FLUTICASONE PROPIONATE 50 MCG/ACT NA SUSP: 2.0000 | Freq: Every day | NASAL | 3 refills | Status: DC

## 2020-01-04 MED ORDER — ALENDRONATE SODIUM 70 MG PO TABS: 70.0000 mg | ORAL_TABLET | ORAL | 3 refills | Status: DC

## 2020-01-04 MED ORDER — ATORVASTATIN CALCIUM 10 MG PO TABS: 10.0000 mg | ORAL_TABLET | Freq: Every day | ORAL | 3 refills | Status: DC

## 2020-01-04 MED ORDER — LISINOPRIL-HYDROCHLOROTHIAZIDE 20-12.5 MG PO TABS: 0.5000 | ORAL_TABLET | Freq: Every day | ORAL | 3 refills | Status: DC

## 2020-01-04 NOTE — Patient Instructions (Addendum)
We talked about the risk of atypical fracture with long standing use (more than 5 years) of Fosamax. I do think you should consider taking a break from this medication and using Prolia for a year.  We can go back to Fosamax after a year break.  Let me know if you are willing to make this change.

## 2020-01-04 NOTE — Progress Notes (Signed)
Subjective: CC: med refills PCP: Terald Sleeper, PA-C FOY:[id number] Cheryl Lee is Cheryl 74 y.o. female presenting to clinic today for:  1.  Age-related osteoporosis Last DEXA scan performed in April 2019 which showed Cheryl T score of -2.1.  She has been on Fosamax weekly for over 6 years now.  She consider Prolia but was reluctant to switch given her history of surgery on the left lower extremity.  Denies any unusual bone pain.  She has chronic left lower extremity pain that radiates from her low back to her foot.  She is Cheryl cane for ambulation as Cheryl result.  No falls.  She uses Motrin 200 mg daily for pain control  2.  Hypertension with hyperlipidemia Patient reports compliance with Zestoretic 1/2 tablet of 20-12.5 mg daily, Lipitor 10 mg daily.  No chest pain, shortness of breath, edema  3.  GERD Patient reports compliance with omeprazole 20 mg daily.  No nausea, vomiting, hematochezia or melena.  No abdominal pain.   ROS: Per HPI  Allergies  Allergen Reactions  . Lycopene Itching and Rash   Past Medical History:  Diagnosis Date  . Acid reflux   . Arthritis   . HNP (herniated nucleus pulposus), lumbar   . Hypercholesteremia   . Hypertension   . Osteoporosis   . Sinus congestion     Current Outpatient Medications:  .  alendronate (FOSAMAX) 70 MG tablet, Take 1 tablet (70 mg total) by mouth every Monday. Take with Cheryl full glass of water on an empty stomach., Disp: 13 tablet, Rfl: 1 .  aspirin 81 MG tablet, Take 81 mg by mouth daily., Disp: , Rfl:  .  atorvastatin (LIPITOR) 10 MG tablet, Take 1 tablet (10 mg total) by mouth daily., Disp: 90 tablet, Rfl: 1 .  fluticasone (FLONASE) 50 MCG/ACT nasal spray, Place 2 sprays into both nostrils daily., Disp: 48 g, Rfl: 1 .  ibuprofen (ADVIL,MOTRIN) 200 MG tablet, Take 200 mg by mouth every 6 (six) hours as needed., Disp: , Rfl:  .  lisinopril-hydrochlorothiazide (ZESTORETIC) 20-12.5 MG tablet, Take 0.5 tablets by mouth daily., Disp: 45 tablet, Rfl:  1 .  Multiple Vitamins-Minerals (MULTIVITAMINS THER. W/MINERALS) TABS, Take 0.5 tablets by mouth 2 (two) times daily. , Disp: , Rfl:  .  omeprazole (PRILOSEC) 20 MG capsule, Take 1 capsule (20 mg total) by mouth daily., Disp: 90 capsule, Rfl: 1 .  vitamin C (ASCORBIC ACID) 500 MG tablet, Take 500 mg by mouth daily.  , Disp: , Rfl:  Social History   Socioeconomic History  . Marital status: Divorced    Spouse name: Not on file  . Number of children: 1  . Years of education: 93  . Highest education level: 12th grade  Occupational History  . Occupation: retired  Tobacco Use  . Smoking status: Never Smoker  . Smokeless tobacco: Never Used  Vaping Use  . Vaping Use: Never used  Substance and Sexual Activity  . Alcohol use: No  . Drug use: No  . Sexual activity: Not Currently  Other Topics Concern  . Not on file  Social History Narrative  . Not on file   Social Determinants of Health   Financial Resource Strain:   . Difficulty of Paying Living Expenses: Not on file  Food Insecurity:   . Worried About Charity fundraiser in the Last Year: Not on file  . Ran Out of Food in the Last Year: Not on file  Transportation Needs:   . Lack  of Transportation (Medical): Not on file  . Lack of Transportation (Non-Medical): Not on file  Physical Activity: Unknown  . Days of Exercise per Week: Not on file  . Minutes of Exercise per Session: 30 min  Stress:   . Feeling of Stress : Not on file  Social Connections:   . Frequency of Communication with Friends and Family: Not on file  . Frequency of Social Gatherings with Friends and Family: Not on file  . Attends Religious Services: Not on file  . Active Member of Clubs or Organizations: Not on file  . Attends Archivist Meetings: Not on file  . Marital Status: Not on file  Intimate Partner Violence: Not At Risk  . Fear of Current or Ex-Partner: No  . Emotionally Abused: No  . Physically Abused: No  . Sexually Abused: No    Family History  Problem Relation Age of Onset  . Cancer Mother   . Alzheimer's disease Mother   . Heart disease Mother   . Early death Father   . Diabetes Sister   . Stroke Brother   . Diabetes Sister   . Cancer Sister 24       uterine  . Hypertension Sister   . Healthy Daughter     Objective: Office vital signs reviewed. 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 female   1. Age-related osteoporosis without current pathological fracture We discussed Fosamax and indications for drug holiday given greater than 5-year use.  She was very reluctant to make this change for checking her next DEXA scan.  I have ordered the DEXA scan.  Would recommend Prolia or other nonbisphosphonate for drug holiday.  We will plan for calcium, vitamin D with fasting labs at next visit - alendronate (FOSAMAX) 70 MG tablet; Take 1 tablet (70 mg total) by mouth every Monday. Take with Cheryl full glass of water on an empty stomach.  Dispense: 13 tablet; Refill: 3 - DG [facility] DEXA; Future  2. Hyperlipidemia with target LDL less than 100 Continue Lipitor. - atorvastatin (LIPITOR) 10 MG tablet; Take 1 tablet (10 mg total) by mouth daily.  Dispense: 90 tablet; Refill: 3  3. Essential hypertension Stable. - lisinopril-hydrochlorothiazide  (ZESTORETIC) 20-12.5 MG tablet; Take 0.5 tablets by mouth daily.  Dispense: 45 tablet; Refill: 3  4. Gastroesophageal reflux disease without esophagitis Stable. - omeprazole (PRILOSEC) 20 MG capsule; Take 1 capsule (20 mg total) by mouth daily.  Dispense: 90 capsule; Refill: 3   No orders of the defined types were placed in this encounter.  No orders of the defined types were placed in this encounter.    Janora Norlander, DO [place] [phone]

## 2020-01-06 ENCOUNTER — Other Ambulatory Visit: Payer: Self-pay

## 2020-01-06 ENCOUNTER — Ambulatory Visit (INDEPENDENT_AMBULATORY_CARE_PROVIDER_SITE_OTHER): Payer: Medicare Other

## 2020-01-06 DIAGNOSIS — Z0000 Encounter for general adult medical examination without abnormal findings: Secondary | ICD-10-CM | POA: Diagnosis not present

## 2020-01-06 NOTE — Progress Notes (Signed)
MEDICARE ANNUAL WELLNESS VISIT  01/06/2020  Telephone Visit Disclaimer This Medicare AWV was conducted by telephone due to national recommendations for restrictions regarding the COVID-19 Pandemic (e.g. social distancing).  I verified, using two identifiers, that I am speaking with Cheryl Lee or their authorized healthcare agent. I discussed the limitations, risks, security, and privacy concerns of performing an evaluation and management service by telephone and the potential availability of an in-person appointment in the future. The patient expressed understanding and agreed to proceed.   Subjective:  Cheryl Lee is a 74 y.o. female patient of Cheryl Sleeper, PA-C who had a Medicare Annual Wellness Visit today via telephone. Cheryl Lee lives in [place] and was located at her home during this televisit. I was located here in the office at [place]. Cheryl Lee lives alone. She has a daughter that lives nearby and two grandchildren. 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 She enjoys watching TV, going to church and dinner with her daughter, and working word search puzzles. She stays as active as she can but can only walk for short periods of time due to sciatica pain and SOB.   Patient Care Team: Theodoro Clock as PCP - General (Physician Assistant)  Advanced Directives 01/06/2020 01/05/2019 12/31/2017 06/11/2017 02/17/2017 02/15/2017 02/13/2017  Does Patient Have a Medical Advance Directive? No No No No No No No  Would patient like information on creating a medical advance directive? No - Patient declined No - Patient declined No - Patient declined No - Patient declined No - Patient declined - -    Hospital Utilization Over the Past 12 Months: # of hospitalizations or ER visits: 0 # of surgeries: 0  Review of Systems    Patient reports that her overall health is unchanged compared to last year.  History obtained from  chart review  Patient Reported Readings (BP, Pulse, CBG, Weight, etc) none  Pain Assessment Pain : 0-10 Pain Score: 5  Pain Type: Chronic pain Pain Location: Back Pain Descriptors / Indicators: Aching Pain Onset: More than a month ago Pain Frequency: Intermittent Pain Relieving Factors: Resting Effect of Pain on Daily Activities: Can't do very much at one time  Pain Relieving Factors: Resting  Current Medications & Allergies (verified) Allergies as of 01/06/2020      Reactions   Lycopene Itching, Rash      Medication List       Accurate as of January 06, 2020 11:28 AM. If you have any questions, ask your nurse or doctor.        alendronate 70 MG tablet Commonly known as: FOSAMAX Take 1 tablet (70 mg total) by mouth every Monday. Take with a full glass of water on an empty stomach.   aspirin 81 MG tablet Take 81 mg by mouth daily.   atorvastatin 10 MG tablet Commonly known as: LIPITOR Take 1 tablet (10 mg total) by mouth daily.   fluticasone 50 MCG/ACT nasal spray Commonly known as: FLONASE Place 2 sprays into both nostrils daily.   ibuprofen 200 MG tablet Commonly known as: ADVIL Take 200 mg by mouth every 6 (six) hours as needed.   lisinopril-hydrochlorothiazide 20-12.5 MG tablet Commonly known as: ZESTORETIC Take 0.5 tablets by mouth daily.   multivitamins ther. w/minerals Tabs tablet Take 0.5 tablets by mouth 2 (two) times daily.   omeprazole 20 MG capsule Commonly known as: PRILOSEC Take 1 capsule (20 mg total) by mouth daily.  vitamin C 500 MG tablet Commonly known as: ASCORBIC ACID Take 500 mg by mouth daily.       History (reviewed): Past Medical History:  Diagnosis Date  . Acid reflux   . Arthritis   . HNP (herniated nucleus pulposus), lumbar   . Hypercholesteremia   . Hypertension   . Osteoporosis   . Sinus congestion    Past Surgical History:  Procedure Laterality Date  . CATARACT EXTRACTION    . COLONOSCOPY    . LUMBAR  LAMINECTOMY/DECOMPRESSION MICRODISCECTOMY Left 05/01/2017   Procedure: Microdiscectomy - Lumbar four-five  - left;  Surgeon: Kary Kos, MD;  Location: [facility];  Service: Neurosurgery;  Laterality: Left;  . Right snkle repair     Family History  Problem Relation Age of Onset  . Cancer Mother   . Alzheimer's disease Mother   . Heart disease Mother   . Early death Father   . Diabetes Sister   . Stroke Brother   . Diabetes Sister   . Cancer Sister 67       uterine  . Hypertension Sister   . Healthy Daughter    Social History   Socioeconomic History  . Marital status: Divorced    Spouse name: Not on file  . Number of children: 1  . Years of education: 56  . Highest education level: 12th grade  Occupational History  . Occupation: retired  Tobacco Use  . Smoking status: Never Smoker  . Smokeless tobacco: Never Used  Vaping Use  . Vaping Use: Never used  Substance and Sexual Activity  . Alcohol use: No  . Drug use: No  . Sexual activity: Not Currently  Other Topics Concern  . Not on file  Social History Narrative  . Not on file   Social Determinants of Health   Financial Resource Strain:   . Difficulty of Paying Living Expenses: Not on file  Food Insecurity:   . Worried About Charity fundraiser in the Last Year: Not on file  . Ran Out of Food in the Last Year: Not on file  Transportation Needs:   . Lack of Transportation (Medical): Not on file  . Lack of Transportation (Non-Medical): Not on file  Physical Activity:   . Days of Exercise per Week: Not on file  . Minutes of Exercise per Session: Not on file  Stress:   . Feeling of Stress : Not on file  Social Connections:   . Frequency of Communication with Friends and Family: Not on file  . Frequency of Social Gatherings with Friends and Family: Not on file  . Attends Religious Services: Not on file  . Active Member of Clubs or Organizations: Not on file  . Attends Archivist Meetings: Not on file  .  Marital Status: Not on file    Activities of Daily Living In your present state of health, do you have any difficulty performing the following activities: 01/06/2020  Hearing? N  Vision? Y  Comment Wears glasses all the time. Blind in right eye  Difficulty concentrating or making decisions? N  Walking or climbing stairs? N  Dressing or bathing? N  Doing errands, shopping? N  Preparing Food and eating ? N  Using the Toilet? N  In the past six months, have you accidently leaked urine? N  Do you have problems with loss of bowel control? N  Managing your Medications? N  Managing your Finances? N  Housekeeping or managing your Housekeeping? N  Some recent  data might be hidden    Patient Education/ Literacy How often do you need to have someone help you when you read instructions, pamphlets, or other written materials from your doctor or pharmacy?: 1 - Never What is the last grade level you completed in school?: 12th grade  Exercise Current Exercise Habits: The patient does not participate in regular exercise at present, Exercise limited by: orthopedic condition(s)  Diet Patient reports consuming 3 meals a day and 2 snack(s) a day Patient reports that her primary diet is: Regular Patient reports that she does have regular access to food.   Depression Screen PHQ 2/9 Scores 01/06/2020 01/04/2020 07/08/2019 01/05/2019 01/05/2019 07/07/2018 03/03/2018  PHQ - 2 Score 0 0 0 0 0 0 0  PHQ- 9 Score 0 0 - - - - -     Fall Risk Fall Risk  01/06/2020 01/04/2020 07/08/2019 01/05/2019 01/05/2019  Falls in the past year? 0 0 0 0 0  Number falls in past yr: - - 0 0 -  Injury with Fall? - - 0 0 -  Risk Factor Category  - - - - -  Risk for fall due to : - - - Impaired balance/gait -  Follow up - - Falls evaluation completed Falls prevention discussed -  Comment - - - no throw rugs in the house, has adequate lighting in walkways and grabrails in the bathroom -     Objective:  Cheryl Lee seemed  alert and oriented and she participated appropriately during our telephone visit.  Blood Pressure Weight BMI  BP Readings from Last 3 Encounters:  01/04/20 128/87  01/05/19 114/77  07/07/18 127/83   Wt Readings from Last 3 Encounters:  01/04/20 144 lb (65.3 kg)  01/05/19 145 lb 3.2 oz (65.9 kg)  07/07/18 145 lb 12.8 oz (66.1 kg)   BMI Readings from Last 1 Encounters:  01/04/20 29.08 kg/m    *Unable to obtain current vital signs, weight, and BMI due to telephone visit type  Hearing/Vision  . Cheryl Lee did not seem to have difficulty with hearing/understanding during the telephone conversation . Reports that she has had a formal eye exam by an eye care professional within the past year . Reports that she has not had a formal hearing evaluation within the past year *Unable to fully assess hearing and vision during telephone visit type  Cognitive Function: 6CIT Screen 01/06/2020 01/05/2019  What Year? 0 points 0 points  What month? 0 points 0 points  What time? 0 points 0 points  Count back from 20 0 points 0 points  Months in reverse 0 points 2 points  Repeat phrase 0 points 0 points  Total Score 0 2   (Normal:0-7, Significant for Dysfunction: >8)  Normal Cognitive Function Screening: Yes   Immunization & Health Maintenance Record Immunization History  Administered Date(s) Administered  . Pneumococcal Conjugate-13 09/03/2014  . Pneumococcal Polysaccharide-23 08/30/2012  . Tdap 01/01/2012  . Zoster 01/01/2012    Health Maintenance  Topic Date Due  . COLONOSCOPY  08/13/2018  . MAMMOGRAM  10/18/2019  . INFLUENZA VACCINE  12/13/2019  . COVID-19 Vaccine (1) 01/20/2020 (Originally 03/18/1958)  . TETANUS/TDAP  12/31/2021  . DEXA SCAN  Completed  . Hepatitis C Screening  Completed  . PNA vac Low Risk Adult  Completed       Assessment  This is a routine wellness examination for Cheryl Lee.  Health Maintenance: Due or Overdue Health Maintenance Due  Topic Date Due    .  COLONOSCOPY  08/13/2018  . MAMMOGRAM  10/18/2019  . INFLUENZA VACCINE  12/13/2019    Cheryl Lee does not need a referral for Community Assistance: Care Management:   no Social Work:    no Prescription Assistance:  no Nutrition/Diabetes Education:  no   Plan:  Personalized Goals Goals Addressed   None    Personalized Health Maintenance & Screening Recommendations  Screening mammography  Lung Cancer Screening Recommended: no (Low Dose CT Chest recommended if Age 65-80 years, 30 pack-year currently smoking OR have quit w/in past 15 years) Hepatitis C Screening recommended: Done HIV Screening recommended: no  Advanced Directives: Written information was not prepared per patient's request.  Referrals & Orders No orders of the defined types were placed in this encounter.   Follow-up Plan . Follow-up with Cheryl Sleeper, PA-C as planned     I have personally reviewed and noted the following in the patient's chart:   . Medical and social history . Use of alcohol, tobacco or illicit drugs  . Current medications and supplements . Functional ability and status . Nutritional status . Physical activity . Advanced directives . List of other physicians . Hospitalizations, surgeries, and ER visits in previous 12 months . Vitals . Screenings to include cognitive, depression, and falls . Referrals and appointments  In addition, I have reviewed and discussed with Cheryl Lee certain preventive protocols, quality metrics, and best practice recommendations. A written personalized care plan for preventive services as well as general preventive health recommendations is available and can be mailed to the patient at her request.      Rolena Infante LPN [id number]/77/0340

## 2020-01-06 NOTE — Patient Instructions (Signed)
  Ms. Farrier , Thank you for taking time to come for your Medicare Wellness Visit. I appreciate your ongoing commitment to your health goals. Please review the following plan we discussed and let me know if I can assist you in the future.   These are the goals we discussed: Goals    . DIET - INCREASE WATER INTAKE     Try to drink 6-8 glasses of water daily.    . Exercise 150 min/wk Moderate Activity       This is a list of the screening recommended for you and due dates:  Health Maintenance  Topic Date Due  . Colon Cancer Screening  08/13/2018  . Mammogram  10/18/2019  . Flu Shot  12/13/2019  . COVID-19 Vaccine (1) 01/20/2020*  . Tetanus Vaccine  12/31/2021  . DEXA scan (bone density measurement)  Completed  .  Hepatitis C: One time screening is recommended by Center for Disease Control  (CDC) for  adults born from [birth year] through 1965.   Completed  . Pneumonia vaccines  Completed  *Topic was postponed. The date shown is not the original due date.

## 2020-01-19 ENCOUNTER — Other Ambulatory Visit: Payer: Self-pay

## 2020-01-19 ENCOUNTER — Other Ambulatory Visit (HOSPITAL_COMMUNITY)
Admission: RE | Admit: 2020-01-19 | Discharge: 2020-01-19 | Disposition: A | Discharge status: Home / Self Care | Payer: Medicare Other | Source: Ambulatory Visit | Attending: Internal Medicine | Admitting: Internal Medicine

## 2020-01-19 DIAGNOSIS — Z20822 Contact with and (suspected) exposure to covid-19: Secondary | ICD-10-CM | POA: Diagnosis not present

## 2020-01-19 DIAGNOSIS — Z01812 Encounter for preprocedural laboratory examination: Secondary | ICD-10-CM | POA: Insufficient documentation

## 2020-01-19 LAB — SARS CORONAVIRUS 2 (TAT 6-24 HRS): SARS Coronavirus 2: NEGATIVE

## 2020-01-21 ENCOUNTER — Ambulatory Visit (HOSPITAL_COMMUNITY)
Admission: RE | Admit: 2020-01-21 | Discharge: 2020-01-21 | Disposition: A | Discharge status: Home / Self Care | Payer: Medicare Other | Attending: Internal Medicine | Admitting: Internal Medicine

## 2020-01-21 ENCOUNTER — Encounter (HOSPITAL_COMMUNITY): Payer: Self-pay | Admitting: Internal Medicine

## 2020-01-21 ENCOUNTER — Other Ambulatory Visit: Payer: Self-pay

## 2020-01-21 ENCOUNTER — Encounter (HOSPITAL_COMMUNITY)
Admission: RE | Disposition: A | Discharge status: Home / Self Care | Payer: Self-pay | Source: Home / Self Care | Attending: Internal Medicine

## 2020-01-21 DIAGNOSIS — Z20822 Contact with and (suspected) exposure to covid-19: Secondary | ICD-10-CM | POA: Insufficient documentation

## 2020-01-21 DIAGNOSIS — M810 Age-related osteoporosis without current pathological fracture: Secondary | ICD-10-CM | POA: Insufficient documentation

## 2020-01-21 DIAGNOSIS — I10 Essential (primary) hypertension: Secondary | ICD-10-CM | POA: Insufficient documentation

## 2020-01-21 DIAGNOSIS — Z1211 Encounter for screening for malignant neoplasm of colon: Secondary | ICD-10-CM | POA: Insufficient documentation

## 2020-01-21 DIAGNOSIS — D123 Benign neoplasm of transverse colon: Secondary | ICD-10-CM | POA: Insufficient documentation

## 2020-01-21 DIAGNOSIS — K219 Gastro-esophageal reflux disease without esophagitis: Secondary | ICD-10-CM | POA: Diagnosis not present

## 2020-01-21 DIAGNOSIS — Z79899 Other long term (current) drug therapy: Secondary | ICD-10-CM | POA: Insufficient documentation

## 2020-01-21 DIAGNOSIS — K5730 Diverticulosis of large intestine without perforation or abscess without bleeding: Secondary | ICD-10-CM | POA: Diagnosis not present

## 2020-01-21 DIAGNOSIS — Z7982 Long term (current) use of aspirin: Secondary | ICD-10-CM | POA: Insufficient documentation

## 2020-01-21 DIAGNOSIS — E7800 Pure hypercholesterolemia, unspecified: Secondary | ICD-10-CM | POA: Diagnosis not present

## 2020-01-21 DIAGNOSIS — D122 Benign neoplasm of ascending colon: Secondary | ICD-10-CM | POA: Insufficient documentation

## 2020-01-21 DIAGNOSIS — D120 Benign neoplasm of cecum: Secondary | ICD-10-CM | POA: Diagnosis not present

## 2020-01-21 DIAGNOSIS — Z8249 Family history of ischemic heart disease and other diseases of the circulatory system: Secondary | ICD-10-CM | POA: Diagnosis not present

## 2020-01-21 DIAGNOSIS — M1990 Unspecified osteoarthritis, unspecified site: Secondary | ICD-10-CM | POA: Diagnosis not present

## 2020-01-21 HISTORY — PX: POLYPECTOMY: SHX5525

## 2020-01-21 HISTORY — PX: COLONOSCOPY: SHX5424

## 2020-01-21 LAB — HM COLONOSCOPY

## 2020-01-21 SURGERY — COLONOSCOPY
Anesthesia: Moderate Sedation

## 2020-01-21 MED ORDER — SODIUM CHLORIDE 0.9 % IV SOLN: INTRAVENOUS | Status: DC

## 2020-01-21 MED ORDER — MIDAZOLAM HCL 5 MG/5ML IJ SOLN
INTRAMUSCULAR | Status: DC | PRN
  Administered 2020-01-21 (×2): 1 mg via INTRAVENOUS
  Administered 2020-01-21: 2 mg via INTRAVENOUS
  Administered 2020-01-21: 1 mg via INTRAVENOUS

## 2020-01-21 MED ORDER — MEPERIDINE HCL 50 MG/ML IJ SOLN
INTRAMUSCULAR | Status: DC | PRN
Start: 2020-01-21 — End: 2020-01-21
  Administered 2020-01-21 (×2): 25 mg via INTRAVENOUS

## 2020-01-21 MED ORDER — MEPERIDINE HCL 50 MG/ML IJ SOLN
INTRAMUSCULAR | Status: AC
  Filled 2020-01-21: qty 1

## 2020-01-21 MED ORDER — STERILE WATER FOR IRRIGATION IR SOLN
Status: DC | PRN
  Administered 2020-01-21: 1.5 mL

## 2020-01-21 MED ORDER — MIDAZOLAM HCL 5 MG/5ML IJ SOLN
INTRAMUSCULAR | Status: AC
  Filled 2020-01-21: qty 10

## 2020-01-21 NOTE — H&P (Signed)
Cheryl Lee is an 74 y.o. female.   Chief Complaint: Patient is here for colonoscopy. HPI: Patient is 74 year old Caucasian female who is here for screening colonoscopy.  Last exam was in April 2010 and at [facility] in [facility] and was normal.  She denies abdominal pain change in bowel habits or rectal bleeding. Family history is negative for CRC. Last aspirin dose was 3 days ago.  She takes 1-2 Advil's daily on as-needed basis for headache.  Past Medical History:  Diagnosis Date  . Acid reflux   . Arthritis   . HNP (herniated nucleus pulposus), lumbar   . Hypercholesteremia   . Hypertension   . Osteoporosis   . Sinus congestion     Past Surgical History:  Procedure Laterality Date  . CATARACT EXTRACTION    . COLONOSCOPY    . LUMBAR LAMINECTOMY/DECOMPRESSION MICRODISCECTOMY Left 05/01/2017   Procedure: Microdiscectomy - Lumbar four-five  - left;  Surgeon: Kary Kos, MD;  Location: [place];  Service: Neurosurgery;  Laterality: Left;  . Right snkle repair      Family History  Problem Relation Age of Onset  . Cancer Mother   . Alzheimer's disease Mother   . Heart disease Mother   . Early death Father   . Diabetes Sister   . Stroke Brother   . Diabetes Sister   . Cancer Sister 16       uterine  . Hypertension Sister   . Healthy Daughter    Social History:  reports that she has never smoked. She has never used smokeless tobacco. She reports that she does not drink alcohol and does not use drugs.  Allergies:  Allergies  Allergen Reactions  . Lycopene Itching and Rash    Medications Prior to Admission  Medication Sig Dispense Refill  . alendronate (FOSAMAX) 70 MG tablet Take 1 tablet (70 mg total) by mouth every Monday. Take with a full glass of water on an empty stomach. 13 tablet 3  . aspirin 81 MG tablet Take 81 mg by mouth daily.    Marland Kitchen atorvastatin (LIPITOR) 10 MG tablet Take 1 tablet (10 mg total) by mouth daily. 90 tablet 3  .  fluticasone (FLONASE) 50 MCG/ACT nasal spray Place 2 sprays into both nostrils daily. 48 g 3  . ibuprofen (ADVIL,MOTRIN) 200 MG tablet Take 200 mg by mouth daily. Take an additional 200 mg of needed for Sinus problem    . lisinopril-hydrochlorothiazide (ZESTORETIC) 20-12.5 MG tablet Take 0.5 tablets by mouth daily. 45 tablet 3  . Multiple Vitamins-Minerals (MULTIVITAMINS THER. W/MINERALS) TABS Take 1 tablet by mouth daily.     Marland Kitchen omeprazole (PRILOSEC) 20 MG capsule Take 1 capsule (20 mg total) by mouth daily. 90 capsule 3  . Polyethyl Glycol-Propyl Glycol (SYSTANE) 0.4-0.3 % SOLN Place 1 drop into both eyes daily as needed (Dry eye).    . vitamin C (ASCORBIC ACID) 500 MG tablet Take 500 mg by mouth daily.        Results for orders placed or performed during the hospital encounter of 01/19/20 (from the past 48 hour(s))  SARS CORONAVIRUS 2 (TAT 6-24 HRS) Nasopharyngeal Nasopharyngeal Swab     Status: None   Collection Time: 01/19/20 11:00 AM   Specimen: Nasopharyngeal Swab  Result Value Ref Range   SARS Coronavirus 2 NEGATIVE NEGATIVE    Comment: (NOTE) SARS-CoV-2 target nucleic acids are NOT DETECTED.  The SARS-CoV-2 RNA is generally detectable in upper and lower respiratory specimens during the acute  phase of infection. Negative results do not preclude SARS-CoV-2 infection, do not rule out co-infections with other pathogens, and should not be used as the sole basis for treatment or other patient management decisions. Negative results must be combined with clinical observations, patient history, and epidemiological information. The expected result is Negative.  Fact Sheet for Patients: SugarRoll.be  Fact Sheet for Healthcare Providers: https://www.woods-mathews.com/  This test is not yet approved or cleared by the [place] FDA and  has been authorized for detection and/or diagnosis of SARS-CoV-2 by FDA under an Emergency Use Authorization  (EUA). This EUA will remain  in effect (meaning this test can be used) for the duration of the COVID-19 declaration under Se ction 564(b)(1) of the Act, 21 U.S.C. section 360bbb-3(b)(1), unless the authorization is terminated or revoked sooner.  Performed at [facility], [place] [address], [place], [place] [address]    No results found.  Review of Systems  Blood pressure (!) 148/90, pulse (!) 101, temperature 97.9 F (36.6 C), temperature source Oral, resp. rate 20, height 5' (1.524 m), last menstrual period 08/05/1991, SpO2 96 %. Physical Exam HENT:     Mouth/Throat:     Mouth: Mucous membranes are moist.     Pharynx: Oropharynx is clear.  Eyes:     General: No scleral icterus.    Conjunctiva/sclera: Conjunctivae normal.  Cardiovascular:     Rate and Rhythm: Normal rate and regular rhythm.     Heart sounds: Normal heart sounds. No murmur heard.   Pulmonary:     Effort: Pulmonary effort is normal.     Breath sounds: Normal breath sounds.  Abdominal:     Comments: Abdomen is soft and nontender with organomegaly or masses.  Musculoskeletal:        General: No swelling.     Cervical back: Neck supple.  Lymphadenopathy:     Cervical: No cervical adenopathy.  Skin:    General: Skin is warm and dry.  Neurological:     Mental Status: She is alert.      Assessment/Plan Average risk screening colonoscopy.  Hildred Laser, MD 01/21/2020, 7:30 AM

## 2020-01-21 NOTE — Op Note (Signed)
[facility] Patient Name: Cheryl Lee Procedure Date: 01/21/2020 7:14 AM MRN: [id number] Date of Birth: 1946-02-17 Attending MD: Hildred Laser , MD CSN: [id number] Age: 74 Admit Type: Outpatient Procedure:                Colonoscopy Indications:              Screening for colorectal malignant neoplasm Providers:                Hildred Laser, MD, Caprice Kluver, Kristine L. Risa Grill, Technician, Randa Spike, [employer] officer Referring MD:             Theador Hawthorne. Hawks, FNP Medicines:                Meperidine 50 mg IV, Midazolam 5 mg IV Complications:            No immediate complications. Estimated Blood Loss:     Estimated blood loss was minimal. Procedure:                Pre-Anesthesia Assessment:                           - Prior to the procedure, a History and Physical                            was performed, and patient medications and                            allergies were reviewed. The patient's tolerance of                            previous anesthesia was also reviewed. The risks                            and benefits of the procedure and the sedation                            options and risks were discussed with the patient.                            All questions were answered, and informed consent                            was obtained. Prior Anticoagulants: The patient has                            taken no previous anticoagulant or antiplatelet                            agents except for aspirin and has taken no previous                            anticoagulant or antiplatelet agents except for  NSAID medication. ASA Grade Assessment: II - A                            patient with mild systemic disease. After reviewing                            the risks and benefits, the patient was deemed in                            satisfactory condition to undergo the procedure.                           After  obtaining informed consent, the colonoscope                            was passed under direct vision. Throughout the                            procedure, the patient's blood pressure, pulse, and                            oxygen saturations were monitored continuously. The                            [id number]-[id number] ([id number]) scope was introduced through                            the anus and advanced to the the cecum, identified                            by appendiceal orifice and ileocecal valve. The                            colonoscopy was performed without difficulty. The                            patient tolerated the procedure well. The quality                            of the bowel preparation was excellent. The                            ileocecal valve, appendiceal orifice, and rectum                            were photographed. Scope In: 7:39:43 AM Scope Out: 7:59:16 AM Scope Withdrawal Time: 0 hours 13 minutes 44 seconds  Total Procedure Duration: 0 hours 19 minutes 33 seconds  Findings:      The perianal and digital rectal examinations were normal.      Two polyps were found in the ileocecal valve. The polyps were diminutive       in size. These were biopsied with a cold forceps for histology. The       pathology specimen was placed into Bottle  Number 1.      Two polyps were found in the hepatic flexure and ascending colon. The       polyps were 4 to 6 mm in size. These polyps were removed with a cold       snare. Resection and retrieval were complete. The pathology specimen was       placed into Bottle Number 1.      Scattered diverticula were found in the sigmoid colon and ascending       colon.      The retroflexed view of the distal rectum and anal verge was normal and       showed no anal or rectal abnormalities. Impression:               - Two diminutive polyps at the ileocecal valve.                            Biopsied.                           - Two 4 to 6 mm  polyps at the hepatic flexure and                            in the ascending colon, removed with a cold snare.                            Resected and retrieved.                           - Diverticulosis in the sigmoid colon and in the                            ascending colon. Moderate Sedation:      Moderate (conscious) sedation was administered by the endoscopy nurse       and supervised by the endoscopist. The following parameters were       monitored: oxygen saturation, heart rate, blood pressure, CO2       capnography and response to care. Total physician intraservice time was       25 minutes. Recommendation:           - Patient has a contact number available for                            emergencies. The signs and symptoms of potential                            delayed complications were discussed with the                            patient. Return to normal activities tomorrow.                            Written discharge instructions were provided to the                            patient.                           -  High fiber diet today.                           - Continue present medications.                           - No aspirin, ibuprofen, naproxen, or other                            non-steroidal anti-inflammatory drugs for 1 day.                           - Await pathology results.                           - Repeat colonoscopy in 5 years for surveillance                            based on pathology results. Procedure Code(s):        --- Professional ---                           [phone], Colonoscopy, flexible; with removal of                            tumor(s), polyp(s), or other lesion(s) by snare                            technique                           [id number], 59, Colonoscopy, flexible; with biopsy,                            single or multiple                           99153, Moderate sedation; each additional 15                            minutes  intraservice time                           G0500, Moderate sedation services provided by the                            same physician or other qualified health care                            professional performing a gastrointestinal                            endoscopic service that sedation supports,                            requiring the presence of an independent trained  observer to assist in the monitoring of the                            patient's level of consciousness and physiological                            status; initial 15 minutes of intra-service time;                            patient age 17 years or older (additional time may                            be reported with [phone], as appropriate) Diagnosis Code(s):        --- Professional ---                           Z12.11, Encounter for screening for malignant                            neoplasm of colon                           K63.5, Polyp of colon                           K57.30, Diverticulosis of large intestine without                            perforation or abscess without bleeding CPT copyright 2019 American [employer]. All rights reserved. The codes documented in this report are preliminary and upon coder review may  be revised to meet current compliance requirements. Hildred Laser, MD Hildred Laser, MD 01/21/2020 8:11:07 AM This report has been signed electronically. Number of Addenda: 0

## 2020-01-21 NOTE — Discharge Instructions (Signed)
Colonoscopy, Adult, Care After This sheet gives you information about how to care for yourself after your procedure. Your doctor may also give you more specific instructions. If you have problems or questions, call your doctor. What can I expect after the procedure? After the procedure, it is common to have:  A small amount of blood in your poop (stool) for 24 hours.  Some gas.  Mild cramping or bloating in your belly (abdomen). Follow these instructions at home: Eating and drinking   Drink enough fluid to keep your pee (urine) pale yellow.  Follow instructions from your doctor about what you cannot eat or drink.  Return to your normal diet as told by your doctor. Avoid heavy or fried foods that are hard to digest. Activity  Rest as told by your doctor.  Do not sit for a long time without moving. Get up to take short walks every 1-2 hours. This is important. Ask for help if you feel weak or unsteady.  Return to your normal activities as told by your doctor. Ask your doctor what activities are safe for you. To help cramping and bloating:   Try walking around.  Put heat on your belly as told by your doctor. Use the heat source that your doctor recommends, such as a moist heat pack or a heating pad. ? Put a towel between your skin and the heat source. ? Leave the heat on for 20-30 minutes. ? Remove the heat if your skin turns bright red. This is very important if you are unable to feel pain, heat, or cold. You may have a greater risk of getting burned. General instructions  For the first 24 hours after the procedure: ? Do not drive or use machinery. ? Do not sign important documents. ? Do not drink alcohol. ? Do your daily activities more slowly than normal. ? Eat foods that are soft and easy to digest.  Take over-the-counter or prescription medicines only as told by your doctor.  Keep all follow-up visits as told by your doctor. This is important. Contact a doctor  if:  You have blood in your poop 2-3 days after the procedure. Get help right away if:  You have more than a small amount of blood in your poop.  You see large clumps of tissue (blood clots) in your poop.  Your belly is swollen.  You feel like you may vomit (nauseous).  You vomit.  You have a fever.  You have belly pain that gets worse, and medicine does not help your pain. Summary  After the procedure, it is common to have a small amount of blood in your poop. You may also have mild cramping and bloating in your belly.  For the first 24 hours after the procedure, do not drive or use machinery, do not sign important documents, and do not drink alcohol.  Get help right away if you have a lot of blood in your poop, feel like you may vomit, have a fever, or have more belly pain. This information is not intended to replace advice given to you by your health care provider. Make sure you discuss any questions you have with your health care provider. Document Revised: 11/24/2018 Document Reviewed: 11/24/2018 Elsevier Patient Education  [place]. Colon Polyps  Polyps are tissue growths inside the body. Polyps can grow in many places, including the large intestine (colon). A polyp may be a round bump or a mushroom-shaped growth. You could have one polyp or several. Most  colon polyps are noncancerous (benign). However, some colon polyps can become cancerous over time. Finding and removing the polyps early can help prevent this. What are the causes? The exact cause of colon polyps is not known. What increases the risk? You are more likely to develop this condition if you:  Have a family history of colon cancer or colon polyps.  Are older than 10 or older than 45 if you are African American.  Have inflammatory bowel disease, such as ulcerative colitis or Crohn's disease.  Have certain hereditary conditions, such as: ? Familial adenomatous polyposis. ? Lynch  syndrome. ? Turcot syndrome. ? Peutz-Jeghers syndrome.  Are overweight.  Smoke cigarettes.  Do not get enough exercise.  Drink too much alcohol.  Eat a diet that is high in fat and red meat and low in fiber.  Had childhood cancer that was treated with abdominal radiation. What are the signs or symptoms? Most polyps do not cause symptoms. If you have symptoms, they may include:  Blood coming from your rectum when having a bowel movement.  Blood in your stool. The stool may look dark red or black.  Abdominal pain.  A change in bowel habits, such as constipation or diarrhea. How is this diagnosed? This condition is diagnosed with a colonoscopy. This is a procedure in which a lighted, flexible scope is inserted into the anus and then passed into the colon to examine the area. Polyps are sometimes found when a colonoscopy is done as part of routine cancer screening tests. How is this treated? Treatment for this condition involves removing any polyps that are found. Most polyps can be removed during a colonoscopy. Those polyps will then be tested for cancer. Additional treatment may be needed depending on the results of testing. Follow these instructions at home: Lifestyle  Maintain a healthy weight, or lose weight if recommended by your health care provider.  Exercise every day or as told by your health care provider.  Do not use any products that contain nicotine or tobacco, such as cigarettes and e-cigarettes. If you need help quitting, ask your health care provider.  If you drink alcohol, limit how much you have: ? 0-1 drink a day for women. ? 0-2 drinks a day for men.  Be aware of how much alcohol is in your drink. In the [place] one drink equals one 12 oz bottle of beer (355 mL), one 5 oz glass of wine (148 mL), or one 1 oz shot of hard liquor (44 mL). Eating and drinking   Eat foods that are high in fiber, such as fruits, vegetables, and whole grains.  Eat foods that  are high in calcium and vitamin D, such as milk, cheese, yogurt, eggs, liver, fish, and broccoli.  Limit foods that are high in fat, such as fried foods and desserts.  Limit the amount of red meat and processed meat you eat, such as hot dogs, sausage, bacon, and lunch meats. General instructions  Keep all follow-up visits as told by your health care provider. This is important. ? This includes having regularly scheduled colonoscopies. ? Talk to your health care provider about when you need a colonoscopy. Contact a health care provider if:  You have new or worsening bleeding during a bowel movement.  You have new or increased blood in your stool.  You have a change in bowel habits.  You lose weight for no known reason. Summary  Polyps are tissue growths inside the body. Polyps can grow in many places,  including the colon.  Most colon polyps are noncancerous (benign), but some can become cancerous over time.  This condition is diagnosed with a colonoscopy.  Treatment for this condition involves removing any polyps that are found. Most polyps can be removed during a colonoscopy. This information is not intended to replace advice given to you by your health care provider. Make sure you discuss any questions you have with your health care provider. Document Revised: 08/15/2017 Document Reviewed: 08/15/2017 Elsevier Patient Education  [place]. High-Fiber Diet Fiber, also called dietary fiber, is a type of carbohydrate that is found in fruits, vegetables, whole grains, and beans. A high-fiber diet can have many health benefits. Your health care provider may recommend a high-fiber diet to help:  Prevent constipation. Fiber can make your bowel movements more regular.  Lower your cholesterol.  Relieve the following conditions: ? Swelling of veins in the anus (hemorrhoids). ? Swelling and irritation (inflammation) of specific areas of the digestive tract (uncomplicated  diverticulosis). ? A problem of the large intestine (colon) that sometimes causes pain and diarrhea (irritable bowel syndrome, IBS).  Prevent overeating as part of a weight-loss plan.  Prevent heart disease, type 2 diabetes, and certain cancers. What is my plan? The recommended daily fiber intake in grams (g) includes:  38 g for men age 17 or younger.  30 g for men over age 77.  65 g for women age 79 or younger.  21 g for women over age 46. You can get the recommended daily intake of dietary fiber by:  Eating a variety of fruits, vegetables, grains, and beans.  Taking a fiber supplement, if it is not possible to get enough fiber through your diet. What do I need to know about a high-fiber diet?  It is better to get fiber through food sources rather than from fiber supplements. There is not a lot of research about how effective supplements are.  Always check the fiber content on the nutrition facts label of any prepackaged food. Look for foods that contain 5 g of fiber or more per serving.  Talk with a diet and nutrition specialist (dietitian) if you have questions about specific foods that are recommended or not recommended for your medical condition, especially if those foods are not listed below.  Gradually increase how much fiber you consume. If you increase your intake of dietary fiber too quickly, you may have bloating, cramping, or gas.  Drink plenty of water. Water helps you to digest fiber. What are tips for following this plan?  Eat a wide variety of high-fiber foods.  Make sure that half of the grains that you eat each day are whole grains.  Eat breads and cereals that are made with whole-grain flour instead of refined flour or white flour.  Eat brown rice, bulgur wheat, or millet instead of white rice.  Start the day with a breakfast that is high in fiber, such as a cereal that contains 5 g of fiber or more per serving.  Use beans in place of meat in soups,  salads, and pasta dishes.  Eat high-fiber snacks, such as berries, raw vegetables, nuts, and popcorn.  Choose whole fruits and vegetables instead of processed forms like juice or sauce. What foods can I eat?  Fruits Berries. Pears. Apples. Oranges. Avocado. Prunes and raisins. Dried figs. Vegetables Sweet potatoes. Spinach. Kale. Artichokes. Cabbage. Broccoli. Cauliflower. Green peas. Carrots. Squash. Grains Whole-grain breads. Multigrain cereal. Oats and oatmeal. Brown rice. Barley. Bulgur wheat. Liberal. Quinoa.  Bran muffins. Popcorn. Rye wafer crackers. Meats and other proteins Navy, kidney, and pinto beans. Soybeans. Split peas. Lentils. Nuts and seeds. Dairy Fiber-fortified yogurt. Beverages Fiber-fortified soy milk. Fiber-fortified orange juice. Other foods Fiber bars. The items listed above may not be a complete list of recommended foods and beverages. Contact a dietitian for more options. What foods are not recommended? Fruits Fruit juice. Cooked, strained fruit. Vegetables Fried potatoes. Canned vegetables. Well-cooked vegetables. Grains White bread. Pasta made with refined flour. White rice. Meats and other proteins Fatty cuts of meat. Fried chicken or fried fish. Dairy Milk. Yogurt. Cream cheese. Sour cream. Fats and oils Butters. Beverages Soft drinks. Other foods Cakes and pastries. The items listed above may not be a complete list of foods and beverages to avoid. Contact a dietitian for more information. Summary  Fiber is a type of carbohydrate. It is found in fruits, vegetables, whole grains, and beans.  There are many health benefits of eating a high-fiber diet, such as preventing constipation, lowering blood cholesterol, helping with weight loss, and reducing your risk of heart disease, diabetes, and certain cancers.  Gradually increase your intake of fiber. Increasing too fast can result in cramping, bloating, and gas. Drink plenty of water while you  increase your fiber.  The best sources of fiber include whole fruits and vegetables, whole grains, nuts, seeds, and beans. This information is not intended to replace advice given to you by your health care provider. Make sure you discuss any questions you have with your health care provider. Document Revised: 03/04/2017 Document Reviewed: 03/04/2017 Elsevier Patient Education  [place].   No aspirin or NSAIDs for 24 hours. Resume other medications as before. High-fiber diet. No driving for 24 hours. Physician will call with biopsy results.

## 2020-01-22 LAB — SURGICAL PATHOLOGY

## 2020-01-25 ENCOUNTER — Encounter (HOSPITAL_COMMUNITY): Payer: Self-pay | Admitting: Internal Medicine

## 2020-01-27 ENCOUNTER — Ambulatory Visit (INDEPENDENT_AMBULATORY_CARE_PROVIDER_SITE_OTHER): Payer: Medicare Other

## 2020-01-27 ENCOUNTER — Other Ambulatory Visit: Payer: Self-pay

## 2020-01-27 DIAGNOSIS — M810 Age-related osteoporosis without current pathological fracture: Secondary | ICD-10-CM | POA: Diagnosis not present

## 2020-01-28 DIAGNOSIS — Z780 Asymptomatic menopausal state: Secondary | ICD-10-CM | POA: Diagnosis not present

## 2020-01-29 ENCOUNTER — Other Ambulatory Visit: Payer: Self-pay | Admitting: Family

## 2020-01-29 DIAGNOSIS — Z1231 Encounter for screening mammogram for malignant neoplasm of breast: Secondary | ICD-10-CM

## 2020-02-02 ENCOUNTER — Other Ambulatory Visit: Payer: Self-pay | Admitting: Family

## 2020-02-19 ENCOUNTER — Encounter (INDEPENDENT_AMBULATORY_CARE_PROVIDER_SITE_OTHER): Payer: Self-pay | Admitting: *Deleted

## 2020-03-11 ENCOUNTER — Other Ambulatory Visit: Payer: Self-pay

## 2020-03-11 ENCOUNTER — Ambulatory Visit
Admission: RE | Admit: 2020-03-11 | Discharge: 2020-03-11 | Disposition: A | Discharge status: Home / Self Care | Payer: Medicare Other | Source: Ambulatory Visit | Attending: Family | Admitting: Family

## 2020-03-11 DIAGNOSIS — Z1231 Encounter for screening mammogram for malignant neoplasm of breast: Secondary | ICD-10-CM | POA: Diagnosis not present

## 2020-05-12 DIAGNOSIS — H40033 Anatomical narrow angle, bilateral: Secondary | ICD-10-CM | POA: Diagnosis not present

## 2020-05-12 DIAGNOSIS — H04123 Dry eye syndrome of bilateral lacrimal glands: Secondary | ICD-10-CM | POA: Diagnosis not present

## 2020-06-06 ENCOUNTER — Encounter (HOSPITAL_COMMUNITY): Payer: Self-pay

## 2020-06-06 ENCOUNTER — Ambulatory Visit (HOSPITAL_COMMUNITY)
Admission: EM | Admit: 2020-06-06 | Discharge: 2020-06-06 | Disposition: A | Discharge status: Home / Self Care | Payer: Medicare Other | Attending: Family Medicine | Admitting: Family Medicine

## 2020-06-06 ENCOUNTER — Other Ambulatory Visit: Payer: Self-pay

## 2020-06-06 DIAGNOSIS — N390 Urinary tract infection, site not specified: Secondary | ICD-10-CM | POA: Diagnosis not present

## 2020-06-06 DIAGNOSIS — Z20822 Contact with and (suspected) exposure to covid-19: Secondary | ICD-10-CM | POA: Diagnosis not present

## 2020-06-06 DIAGNOSIS — R52 Pain, unspecified: Secondary | ICD-10-CM | POA: Insufficient documentation

## 2020-06-06 DIAGNOSIS — R531 Weakness: Secondary | ICD-10-CM | POA: Diagnosis not present

## 2020-06-06 DIAGNOSIS — U071 COVID-19: Secondary | ICD-10-CM | POA: Insufficient documentation

## 2020-06-06 LAB — CBC
HCT: 44.9 % (ref 36.0–46.0)
Hemoglobin: 14.7 g/dL (ref 12.0–15.0)
MCH: 30.7 pg (ref 26.0–34.0)
MCHC: 32.7 g/dL (ref 30.0–36.0)
MCV: 93.7 fL (ref 80.0–100.0)
Platelets: 308 10*3/uL (ref 150–400)
RBC: 4.79 MIL/uL (ref 3.87–5.11)
RDW: 12.8 % (ref 11.5–15.5)
WBC: 8.9 10*3/uL (ref 4.0–10.5)
nRBC: 0 % (ref 0.0–0.2)

## 2020-06-06 LAB — POCT URINALYSIS DIPSTICK, ED / UC
Bilirubin Urine: NEGATIVE
Glucose, UA: NEGATIVE mg/dL
Hgb urine dipstick: NEGATIVE
Ketones, ur: 15 mg/dL — AB
Nitrite: POSITIVE — AB
Protein, ur: 30 mg/dL — AB
Specific Gravity, Urine: 1.025 (ref 1.005–1.030)
Urobilinogen, UA: 1 mg/dL (ref 0.0–1.0)
pH: 5.5 (ref 5.0–8.0)

## 2020-06-06 LAB — BASIC METABOLIC PANEL
Anion gap: 16 — ABNORMAL HIGH (ref 5–15)
BUN: 26 mg/dL — ABNORMAL HIGH (ref 8–23)
CO2: 24 mmol/L (ref 22–32)
Calcium: 9.8 mg/dL (ref 8.9–10.3)
Chloride: 100 mmol/L (ref 98–111)
Creatinine, Ser: 1.27 mg/dL — ABNORMAL HIGH (ref 0.44–1.00)
GFR, Estimated: 44 mL/min — ABNORMAL LOW (ref >60)
Glucose, Bld: 117 mg/dL — ABNORMAL HIGH (ref 70–99)
Potassium: 4 mmol/L (ref 3.5–5.1)
Sodium: 140 mmol/L (ref 135–145)

## 2020-06-06 LAB — SARS CORONAVIRUS 2 (TAT 6-24 HRS): SARS Coronavirus 2: POSITIVE — AB

## 2020-06-06 MED ORDER — CEPHALEXIN 500 MG PO CAPS: 500.0000 mg | ORAL_CAPSULE | Freq: Two times a day (BID) | ORAL | 0 refills | Status: AC

## 2020-06-06 MED ORDER — PREDNISONE 20 MG PO TABS: 40.0000 mg | ORAL_TABLET | Freq: Every day | ORAL | 0 refills | Status: DC

## 2020-06-06 MED ORDER — IPRATROPIUM BROMIDE 0.03 % NA SOLN: 2.0000 | Freq: Two times a day (BID) | NASAL | 0 refills | Status: DC | PRN

## 2020-06-06 NOTE — Discharge Instructions (Signed)
You will be notified of any abnormal lab results and any additional treatment prescribed once your urine culture and blood work result.  If all of your lab results are normal and there is no changes in treatment we will not notify you via phone.  Complete all medication as prescribed.  Your Covid test will result within 1 to 2 days.  In the meantime quarantine until your results are known. If you develop any worsening shortness of breath, chest tightness or chest pain go immediately to the nearest emergency department.

## 2020-06-06 NOTE — ED Triage Notes (Signed)
Pt states she has had generalized weakness since Tuesday. Pt states her thighs bilaterally have been the weakest. Pt also complains of a cough and sore buttocks. Pt is aox4 and ambulatory.

## 2020-06-06 NOTE — ED Provider Notes (Signed)
[place]    CSN: [id number] Arrival date & time: 06/06/20  1324      History   Chief Complaint Chief Complaint  Patient presents with  . Weakness    Since tuesday    HPI Cheryl Lee is a 75 y.o. female.   HPI  Patient presents today with 6 days of generalized weakness mainly occurring in the lower extremities.  Patient has a history of sinus tachycardia, hypertension, with no other cardiac history.  On arrival today patient was tachycardic at 114 and complained of shortness of breath with pulse ox at 97%.  She further explained that the shortness of breath was worsened by the congestion she is having in nares and she occasionally has cough from postnasal drainage.  Patient was also concerned that she may have a urinary tract infection due to the weakness.  She denies any urinary retention, no nausea, no abdominal pain.  Patient also denies having any chest pain or any productive cough.  She reports a close exposure to Covid as her daughter recently tested positive about a week ago.  She is afebrile and has remained afebrile.  She is not confused and was able to give a full listing of all of her home medications.  She has not been taking any OTC medications however has been using Flonase for nasal symptoms without relief.  Past Medical History:  Diagnosis Date  . Acid reflux   . Arthritis   . HNP (herniated nucleus pulposus), lumbar   . Hypercholesteremia   . Hypertension   . Osteoporosis   . Sinus congestion     Patient Active Problem List   Diagnosis Date Noted  . Lumbar nerve root impingement 07/08/2018  . DDD (degenerative disc disease), lumbar 07/08/2018  . Vitamin D deficiency 03/03/2018  . HNP (herniated nucleus pulposus), lumbar 05/01/2017  . BMI 29.0-29.9,adult 12/21/2014  . GERD (gastroesophageal reflux disease)   . Hyperlipidemia with target LDL less than 100   . Hypertension   . Osteoporosis     Past Surgical History:  Procedure Laterality  Date  . CATARACT EXTRACTION    . COLONOSCOPY    . COLONOSCOPY N/A 01/21/2020   Procedure: COLONOSCOPY;  Surgeon: Rogene Houston, MD;  Location: [facility];  Service: Endoscopy;  Laterality: N/A;  730  . LUMBAR LAMINECTOMY/DECOMPRESSION MICRODISCECTOMY Left 05/01/2017   Procedure: Microdiscectomy - Lumbar four-five  - left;  Surgeon: Kary Kos, MD;  Location: [facility];  Service: Neurosurgery;  Laterality: Left;  . POLYPECTOMY  01/21/2020   Procedure: POLYPECTOMY;  Surgeon: Rogene Houston, MD;  Location: [facility];  Service: Endoscopy;;  . Right snkle repair      OB History   No obstetric history on file.      Home Medications    Prior to Admission medications   Medication Sig Start Date End Date Taking? Authorizing Provider  alendronate (FOSAMAX) 70 MG tablet Take 1 tablet (70 mg total) by mouth every Monday. Take with a full glass of water on an empty stomach. 01/04/20  Yes Gottschalk, Ashly M, DO  aspirin 81 MG tablet Take 1 tablet (81 mg total) by mouth daily. 01/22/20  Yes Rehman, Mechele Dawley, MD  atorvastatin (LIPITOR) 10 MG tablet Take 1 tablet (10 mg total) by mouth daily. 01/04/20  Yes Gottschalk, Leatrice Jewels M, DO  cephALEXin (KEFLEX) 500 MG capsule Take 1 capsule (500 mg total) by mouth 2 (two) times daily for 10 days. 06/06/20 06/16/20 Yes Scot Jun,  FNP  fluticasone (FLONASE) 50 MCG/ACT nasal spray Place 2 sprays into both nostrils daily. 01/04/20  Yes Gottschalk, Leatrice Jewels M, DO  ibuprofen (ADVIL,MOTRIN) 200 MG tablet Take 200 mg by mouth daily. Take an additional 200 mg of needed for Sinus problem   Yes [provider]  ipratropium (ATROVENT) 0.03 % nasal spray Place 2 sprays into both nostrils 2 (two) times daily as needed for rhinitis. 06/06/20  Yes Scot Jun, FNP  lisinopril-hydrochlorothiazide (ZESTORETIC) 20-12.5 MG tablet Take 0.5 tablets by mouth daily. 01/04/20  Yes Ronnie Doss M, DO  Multiple Vitamins-Minerals (MULTIVITAMINS THER. W/MINERALS) TABS  Take 1 tablet by mouth daily.   Yes [provider]  omeprazole (PRILOSEC) 20 MG capsule Take 1 capsule (20 mg total) by mouth daily. 01/04/20  Yes Gottschalk, Leatrice Jewels M, DO  Polyethyl Glycol-Propyl Glycol (SYSTANE) 0.4-0.3 % SOLN Place 1 drop into both eyes daily as needed (Dry eye).   Yes [provider]  predniSONE (DELTASONE) 20 MG tablet Take 2 tablets (40 mg total) by mouth daily with breakfast. 06/06/20  Yes Scot Jun, FNP  vitamin C (ASCORBIC ACID) 500 MG tablet Take 500 mg by mouth daily.   Yes [provider]    Family History Family History  Problem Relation Age of Onset  . Cancer Mother   . Alzheimer's disease Mother   . Heart disease Mother   . Early death Father   . Diabetes Sister   . Stroke Brother   . Diabetes Sister   . Cancer Sister 105       uterine  . Hypertension Sister   . Healthy Daughter     Social History Social History   Tobacco Use  . Smoking status: Never Smoker  . Smokeless tobacco: Never Used  Vaping Use  . Vaping Use: Never used  Substance Use Topics  . Alcohol use: No  . Drug use: No     Allergies   Lycopene   Review of Systems Review of Systems Pertinent negatives listed in HPI   Physical Exam Triage Vital Signs ED Triage Vitals  Enc Vitals Group     BP 06/06/20 0909 123/86     Pulse Rate 06/06/20 0909 (!) 114     Resp 06/06/20 0909 18     Temp 06/06/20 0909 (!) 97.4 F (36.3 C)     Temp Source 06/06/20 0909 Oral     SpO2 06/06/20 0909 97 %     Weight --      Height --      Head Circumference --      Peak Flow --      Pain Score 06/06/20 0912 5     Pain Loc --      Pain Edu? --      Excl. in [place]? --    No data found.  Updated Vital Signs BP 123/86 (BP Location: Right Arm)   Pulse (!) 114   Temp (!) 97.4 F (36.3 C) (Oral)   Resp 18   LMP 08/05/1991   SpO2 97%   Visual Acuity Right Eye Distance:   Left Eye Distance:   Bilateral Distance:    Right Eye Near:   Left Eye Near:     Bilateral Near:     Physical Exam Constitutional:      Appearance: She is obese. She is not ill-appearing or toxic-appearing.  HENT:     Head: Normocephalic.     Nose: Mucosal edema and congestion present.  Right Turbinates: Enlarged.     Left Turbinates: Enlarged.  Eyes:     General: Lids are normal.  Cardiovascular:     Rate and Rhythm: Regular rhythm. Tachycardia present.     Heart sounds: Normal heart sounds.  Pulmonary:     Effort: Pulmonary effort is normal.     Breath sounds: Normal breath sounds and air entry. No decreased breath sounds or rhonchi.  Musculoskeletal:     Right lower leg: No edema.     Left lower leg: No edema.  Neurological:     Mental Status: She is alert and oriented to person, place, and time.     GCS: GCS eye subscore is 4. GCS verbal subscore is 5. GCS motor subscore is 6.     Gait: Gait normal.     Comments: Generalized lower extremity weakness, patient is ambulating with an assistive device 3-prong cane   Psychiatric:        Attention and Perception: Attention and perception normal.        Mood and Affect: Mood normal.        Speech: Speech normal.        Behavior: Behavior normal.        Thought Content: Thought content normal.        Cognition and Memory: Cognition and memory normal.        Judgment: Judgment normal.      UC Treatments / Results  Labs (all labs ordered are listed, but only abnormal results are displayed) Labs Reviewed  POCT URINALYSIS DIPSTICK, ED / UC - Abnormal; Notable for the following components:      Result Value   Ketones, ur 15 (*)    Protein, ur 30 (*)    Nitrite POSITIVE (*)    Leukocytes,Ua TRACE (*)    All other components within normal limits  SARS CORONAVIRUS 2 (TAT 6-24 HRS)  URINE CULTURE  CBC  BASIC METABOLIC PANEL    EKG   Radiology No results found.  Procedures Procedures (including critical care time)  Medications Ordered in UC Medications - No data to display  Initial  Impression / Assessment and Plan / UC Course  I have reviewed the triage vital signs and the nursing notes.  Pertinent labs & imaging results that were available during my care of the patient were reviewed by me and considered in my medical decision making (see chart for details).    Patient presents today with multiple nonspecific symptoms and with a recent close exposure to daughter who is positive for COVID-19.  Given generalized weakness and tachycardia obtained an EKG EKG was significant for sinus tach with a rate of 105 without any ST changes.  Compared to prior EKG from 2018 similar tracing.  Patient is negative for any chest pain vital signs are stable.  Also obtained a UA given generalized weakness positive for trace leuks and nitrates covering for acute UTI with Keflex 500 mg twice daily for 10 days urine culture pending.  Given weakness for over a week also obtain a CBC to rule out any anemia or elevation in white count.  Patient given strict ER precautions if she develops any chest pain or worsening weakness or inability to tolerate oral intake.  Overall exam is reassuring patient is mentating well neurological exam found no gross abnormalities.  Patient at baseline walks with a cane due to osteoporosis therefore this is not a new finding.  Patient verbalized understanding and agreement with plan. Final Clinical Impressions(s) /  UC Diagnoses   Final diagnoses:  Generalized weakness  Body aches  Close exposure to COVID-19 virus  Acute UTI (urinary tract infection)     Discharge Instructions     You will be notified of any abnormal lab results and any additional treatment prescribed once your urine culture and blood work result.  If all of your lab results are normal and there is no changes in treatment we will not notify you via phone.  Complete all medication as prescribed.  Your Covid test will result within 1 to 2 days.  In the meantime quarantine until your results are known. If  you develop any worsening shortness of breath, chest tightness or chest pain go immediately to the nearest emergency department.   ED Prescriptions    Medication Sig Dispense Auth. Provider   cephALEXin (KEFLEX) 500 MG capsule Take 1 capsule (500 mg total) by mouth 2 (two) times daily for 10 days. 20 capsule Scot Jun, FNP   predniSONE (DELTASONE) 20 MG tablet Take 2 tablets (40 mg total) by mouth daily with breakfast. 10 tablet Scot Jun, FNP   ipratropium (ATROVENT) 0.03 % nasal spray Place 2 sprays into both nostrils 2 (two) times daily as needed for rhinitis. 30 mL Scot Jun, FNP     PDMP not reviewed this encounter.   Scot Jun, FNP 06/06/20 1147

## 2020-06-08 LAB — URINE CULTURE
Culture: >=100000 — AB
Special Requests: NORMAL

## 2020-07-07 ENCOUNTER — Other Ambulatory Visit: Payer: Self-pay

## 2020-07-07 ENCOUNTER — Encounter: Payer: Self-pay | Admitting: Family

## 2020-07-07 ENCOUNTER — Ambulatory Visit (INDEPENDENT_AMBULATORY_CARE_PROVIDER_SITE_OTHER): Payer: Medicare Other | Admitting: Family

## 2020-07-07 VITALS — BP 118/73 | HR 91 | Temp 97.6°F | Ht 60.0 in | Wt 138.2 lb

## 2020-07-07 DIAGNOSIS — M810 Age-related osteoporosis without current pathological fracture: Secondary | ICD-10-CM | POA: Diagnosis not present

## 2020-07-07 DIAGNOSIS — E559 Vitamin D deficiency, unspecified: Secondary | ICD-10-CM | POA: Diagnosis not present

## 2020-07-07 DIAGNOSIS — I10 Essential (primary) hypertension: Secondary | ICD-10-CM | POA: Diagnosis not present

## 2020-07-07 DIAGNOSIS — M5416 Radiculopathy, lumbar region: Secondary | ICD-10-CM | POA: Diagnosis not present

## 2020-07-07 DIAGNOSIS — E663 Overweight: Secondary | ICD-10-CM | POA: Diagnosis not present

## 2020-07-07 DIAGNOSIS — E785 Hyperlipidemia, unspecified: Secondary | ICD-10-CM | POA: Diagnosis not present

## 2020-07-07 DIAGNOSIS — K219 Gastro-esophageal reflux disease without esophagitis: Secondary | ICD-10-CM | POA: Diagnosis not present

## 2020-07-07 MED ORDER — ATORVASTATIN CALCIUM 10 MG PO TABS: 10.0000 mg | ORAL_TABLET | Freq: Every day | ORAL | 3 refills | Status: DC

## 2020-07-07 MED ORDER — LISINOPRIL-HYDROCHLOROTHIAZIDE 20-12.5 MG PO TABS: 0.5000 | ORAL_TABLET | Freq: Every day | ORAL | 3 refills | Status: DC

## 2020-07-07 MED ORDER — OMEPRAZOLE 20 MG PO CPDR: 20.0000 mg | DELAYED_RELEASE_CAPSULE | Freq: Every day | ORAL | 3 refills | Status: DC

## 2020-07-07 MED ORDER — ALENDRONATE SODIUM 70 MG PO TABS: 70.0000 mg | ORAL_TABLET | ORAL | 3 refills | Status: DC

## 2020-07-07 NOTE — Patient Instructions (Signed)
Osteoporosis  Osteoporosis happens when the bones become thin and less dense than normal. Osteoporosis makes bones more brittle and fragile and more likely to break (fracture). Over time, osteoporosis can cause your bones to become so weak that they fracture after a minor fall. Bones in the hip, wrist, and spine are most likely to fracture due to osteoporosis. What are the causes? The exact cause of this condition is not known. What increases the risk? You are more likely to develop this condition if you:  Have family members with this condition.  Have poor nutrition.  Use the following: ? Steroid medicines, such as prednisone. ? Anti-seizure medicines. ? Nicotine or tobacco, such as cigarettes, e-cigarettes, and chewing tobacco.  Are female.  Are age 50 or older.  Are not physically active (are sedentary).  Are of European or Asian descent.  Have a small body frame. What are the signs or symptoms? A fracture might be the first sign of osteoporosis, especially if the fracture results from a fall or injury that usually would not cause a bone to break. Other signs and symptoms include:  Pain in the neck or low back.  Stooped posture.  Loss of height. How is this diagnosed? This condition may be diagnosed based on:  Your medical history.  A physical exam.  A bone mineral density test, also called a DXA or DEXA test (dual-energy X-ray absorptiometry test). This test uses X-rays to measure the amount of minerals in your bones. How is this treated? This condition may be treated by:  Making lifestyle changes, such as: ? Including foods with more calcium and vitamin D in your diet. ? Doing weight-bearing and muscle-strengthening exercises. ? Stopping tobacco use. ? Limiting alcohol intake.  Taking medicine to slow the process of bone loss or to increase bone density.  Taking daily supplements of calcium and vitamin D.  Taking hormone replacement medicines, such as  estrogen for women and testosterone for men.  Monitoring your levels of calcium and vitamin D. The goal of treatment is to strengthen your bones and lower your risk for a fracture. Follow these instructions at home: Eating and drinking Include calcium and vitamin D in your diet. Calcium is important for bone health, and vitamin D helps your body absorb calcium. Good sources of calcium and vitamin D include:  Certain fatty fish, such as salmon and tuna.  Products that have calcium and vitamin D added to them (are fortified), such as fortified cereals.  Egg yolks.  Cheese.  Liver.   Activity Do exercises as told by your health care provider. Ask your health care provider what exercises and activities are safe for you. You should do:  Exercises that make you work against gravity (weight-bearing exercises), such as tai chi, yoga, or walking.  Exercises to strengthen muscles, such as lifting weights. Lifestyle  Do not drink alcohol if: ? Your health care provider tells you not to drink. ? You are pregnant, may be pregnant, or are planning to become pregnant.  If you drink alcohol: ? Limit how much you use to:  0-1 drink a day for women.  0-2 drinks a day for men.  Know how much alcohol is in your drink. In the [place] one drink equals one 12 oz bottle of beer (355 mL), one 5 oz glass of wine (148 mL), or one 1 oz glass of hard liquor (44 mL).  Do not use any products that contain nicotine or tobacco, such as cigarettes, e-cigarettes, and chewing tobacco.   If you need help quitting, ask your health care provider. Preventing falls  Use devices to help you move around (mobility aids) as needed, such as canes, walkers, scooters, or crutches.  Keep rooms well-lit and clutter-free.  Remove tripping hazards from walkways, including cords and throw rugs.  Install grab bars in bathrooms and safety rails on stairs.  Use rubber mats in the bathroom and other areas that are often wet or  slippery.  Wear closed-toe shoes that fit well and support your feet. Wear shoes that have rubber soles or low heels.  Review your medicines with your health care provider. Some medicines can cause dizziness or changes in blood pressure, which can increase your risk of falling. General instructions  Take over-the-counter and prescription medicines only as told by your health care provider.  Keep all follow-up visits. This is important. Contact a health care provider if:  You have never been screened for osteoporosis and you are: ? A woman who is age 65 or older. ? A man who is age 70 or older. Get help right away if:  You fall or injure yourself. Summary  Osteoporosis is thinning and loss of density in your bones. This makes bones more brittle and fragile and more likely to break (fracture),even with minor falls.  The goal of treatment is to strengthen your bones and lower your risk for a fracture.  Include calcium and vitamin D in your diet. Calcium is important for bone health, and vitamin D helps your body absorb calcium.  Talk with your health care provider about screening for osteoporosis if you are a woman who is age 65 or older, or a man who is age 70 or older. This information is not intended to replace advice given to you by your health care provider. Make sure you discuss any questions you have with your health care provider. Document Revised: 10/15/2019 Document Reviewed: 10/15/2019 Elsevier Patient Education  2021 [employer]  

## 2020-07-07 NOTE — Progress Notes (Signed)
Subjective:    Patient ID: Cheryl Lee, female    DOB: 08-20-1945, 75 y.o.   MRN: [id number]  Chief Complaint  Patient presents with  . Medical Management of Chronic Issues   Pt presents to the office today for establish care with me. She has a hx of closed compression fracture and bulging lumbar disc. She continues to have intermittent aching pain of  7-8 out 10 when walking for a long distance.  Hypertension This is a chronic problem. The current episode started more than 1 year ago. The problem has been resolved since onset. The problem is controlled. Pertinent negatives include no malaise/fatigue, peripheral edema or shortness of breath. Risk factors for coronary artery disease include dyslipidemia, obesity and sedentary lifestyle. The current treatment provides moderate improvement.  Gastroesophageal Reflux She complains of belching and heartburn. This is a chronic problem. The current episode started more than 1 year ago. The problem occurs occasionally. The problem has been waxing and waning. Risk factors include obesity. She has tried a PPI for the symptoms. The treatment provided moderate relief.  Hyperlipidemia This is a chronic problem. The current episode started more than 1 year ago. Pertinent negatives include no shortness of breath. Current antihyperlipidemic treatment includes statins. The current treatment provides moderate improvement of lipids. Risk factors for coronary artery disease include dyslipidemia, hypertension and a sedentary lifestyle.  Osteoporosis  Pt taking Fosamax weekly. Her Dexascan was 02/01/20.    Review of Systems  Constitutional: Negative for malaise/fatigue.  Respiratory: Negative for shortness of breath.   Gastrointestinal: Positive for heartburn.       Objective:   Physical Exam Vitals reviewed.  Constitutional:      General: She is not in acute distress.    Appearance: She is well-developed and well-nourished.  HENT:     Head:  Normocephalic and atraumatic.     Right Ear: Tympanic membrane normal.     Mouth/Throat:     Mouth: Oropharynx is clear and moist.  Neck:     Thyroid: No thyromegaly.  Cardiovascular:     Rate and Rhythm: Normal rate and regular rhythm.     Pulses: Intact distal pulses.     Heart sounds: Murmur heard.    Pulmonary:     Effort: Pulmonary effort is normal. No respiratory distress.     Breath sounds: Normal breath sounds. No wheezing.  Abdominal:     General: Bowel sounds are normal. There is no distension.     Palpations: Abdomen is soft.     Tenderness: There is no abdominal tenderness.  Musculoskeletal:        General: No tenderness or edema. Normal range of motion.     Cervical back: Normal range of motion and neck supple.  Skin:    General: Skin is warm and dry.  Neurological:     Mental Status: She is alert and oriented to person, place, and time.     Cranial Nerves: No cranial nerve deficit.     Gait: Gait abnormal (using cane to walk).     Deep Tendon Reflexes: Reflexes are normal and symmetric.  Psychiatric:        Mood and Affect: Mood and affect normal.        Behavior: Behavior normal.        Thought Content: Thought content normal.        Judgment: Judgment normal.       BP 118/73   Pulse 91   Temp 97.6 F (36.4 C) (  Temporal)   Ht 5' (1.524 m)   Wt 138 lb 3.2 oz (62.7 kg)   LMP 08/05/1991   SpO2 98%   BMI 26.99 kg/m      Assessment & Plan:  MARIYAM REMINGTON comes in today with chief complaint of Medical Management of Chronic Issues   Diagnosis and orders addressed:  1. Age-related osteoporosis without current pathological fracture - alendronate (FOSAMAX) 70 MG tablet; Take 1 tablet (70 mg total) by mouth every Monday. Take with a full glass of water on an empty stomach.  Dispense: 13 tablet; Refill: 3 - CMP14+EGFR - CBC with Differential/Platelet  2. Essential hypertension - lisinopril-hydrochlorothiazide (ZESTORETIC) 20-12.5 MG tablet; Take 0.5  tablets by mouth daily.  Dispense: 45 tablet; Refill: 3 - CMP14+EGFR - CBC with Differential/Platelet  3. Gastroesophageal reflux disease without esophagitis - omeprazole (PRILOSEC) 20 MG capsule; Take 1 capsule (20 mg total) by mouth daily.  Dispense: 90 capsule; Refill: 3 - CMP14+EGFR - CBC with Differential/Platelet  4. Hyperlipidemia with target LDL less than 100 - atorvastatin (LIPITOR) 10 MG tablet; Take 1 tablet (10 mg total) by mouth daily.  Dispense: 90 tablet; Refill: 3 - CMP14+EGFR - CBC with Differential/Platelet - Lipid panel  5. Primary hypertension - CMP14+EGFR - CBC with Differential/Platelet  6. Lumbar nerve root impingement - CMP14+EGFR - CBC with Differential/Platelet  7. Overweight (BMI 25.0-29.9) - CMP14+EGFR - CBC with Differential/Platelet  8. Vitamin D deficiency - CMP14+EGFR - CBC with Differential/Platelet - VITAMIN D 25 Hydroxy (Vit-D Deficiency, Fractures)   Labs pending Health Maintenance reviewed Diet and exercise encouraged  Follow up plan: 6 months   Evelina Dun, FNP

## 2020-07-08 LAB — CMP14+EGFR
ALT: 17 IU/L (ref 0–32)
AST: 19 IU/L (ref 0–40)
Albumin/Globulin Ratio: 1.7 (ref 1.2–2.2)
Albumin: 4.4 g/dL (ref 3.7–4.7)
Alkaline Phosphatase: 98 IU/L (ref 44–121)
BUN/Creatinine Ratio: 20 (ref 12–28)
BUN: 19 mg/dL (ref 8–27)
Bilirubin Total: 0.4 mg/dL (ref 0.0–1.2)
CO2: 23 mmol/L (ref 20–29)
Calcium: 10.4 mg/dL — ABNORMAL HIGH (ref 8.7–10.3)
Chloride: 102 mmol/L (ref 96–106)
Creatinine, Ser: 0.95 mg/dL (ref 0.57–1.00)
GFR calc Af Amer: 68 mL/min/{1.73_m2} (ref >59)
GFR calc non Af Amer: 59 mL/min/{1.73_m2} — ABNORMAL LOW (ref >59)
Globulin, Total: 2.6 g/dL (ref 1.5–4.5)
Glucose: 97 mg/dL (ref 65–99)
Potassium: 4.9 mmol/L (ref 3.5–5.2)
Sodium: 141 mmol/L (ref 134–144)
Total Protein: 7 g/dL (ref 6.0–8.5)

## 2020-07-08 LAB — LIPID PANEL
Chol/HDL Ratio: 3.5 ratio (ref 0.0–4.4)
Cholesterol, Total: 171 mg/dL (ref 100–199)
HDL: 49 mg/dL (ref >39)
LDL Chol Calc (NIH): 91 mg/dL (ref 0–99)
Triglycerides: 179 mg/dL — ABNORMAL HIGH (ref 0–149)
VLDL Cholesterol Cal: 31 mg/dL (ref 5–40)

## 2020-07-08 LAB — CBC WITH DIFFERENTIAL/PLATELET
Basophils Absolute: 0.1 10*3/uL (ref 0.0–0.2)
Basos: 1 %
EOS (ABSOLUTE): 0.3 10*3/uL (ref 0.0–0.4)
Eos: 3 %
Hematocrit: 42.8 % (ref 34.0–46.6)
Hemoglobin: 14 g/dL (ref 11.1–15.9)
Immature Grans (Abs): 0 10*3/uL (ref 0.0–0.1)
Immature Granulocytes: 0 %
Lymphocytes Absolute: 2 10*3/uL (ref 0.7–3.1)
Lymphs: 19 %
MCH: 31 pg (ref 26.6–33.0)
MCHC: 32.7 g/dL (ref 31.5–35.7)
MCV: 95 fL (ref 79–97)
Monocytes Absolute: 0.7 10*3/uL (ref 0.1–0.9)
Monocytes: 7 %
Neutrophils Absolute: 7.5 10*3/uL — ABNORMAL HIGH (ref 1.4–7.0)
Neutrophils: 70 %
Platelets: 425 10*3/uL (ref 150–450)
RBC: 4.51 x10E6/uL (ref 3.77–5.28)
RDW: 13 % (ref 11.7–15.4)
WBC: 10.5 10*3/uL (ref 3.4–10.8)

## 2020-07-08 LAB — VITAMIN D 25 HYDROXY (VIT D DEFICIENCY, FRACTURES): Vit D, 25-Hydroxy: 39.4 ng/mL (ref 30.0–100.0)

## 2020-07-14 ENCOUNTER — Telehealth: Payer: Self-pay

## 2020-07-14 MED ORDER — FLUTICASONE PROPIONATE 50 MCG/ACT NA SUSP: 2.0000 | Freq: Every day | NASAL | 3 refills | Status: DC

## 2020-07-14 NOTE — Telephone Encounter (Signed)
  Prescription Request  07/14/2020  What is the name of the medication or equipment? Flonase  Have you contacted your pharmacy to request a refill? (if applicable) Yes  Which pharmacy would you like this sent to? [facility]   **Pt also wanted to let Alyse Low know that she is not going to take the Ipratropium Nasal Spray because everytime she does, it makes her nose feel all stopped up. Wants medicine to be taken off of her med list and call in refills on the flonase Rx.

## 2020-07-14 NOTE — Telephone Encounter (Signed)
Refill sent.

## 2021-01-04 ENCOUNTER — Other Ambulatory Visit: Payer: Self-pay

## 2021-01-04 ENCOUNTER — Encounter: Payer: Self-pay | Admitting: Family

## 2021-01-04 ENCOUNTER — Ambulatory Visit (INDEPENDENT_AMBULATORY_CARE_PROVIDER_SITE_OTHER): Payer: Medicare Other | Admitting: Family

## 2021-01-04 VITALS — BP 131/76 | HR 89 | Temp 97.9°F | Ht 60.0 in | Wt 143.6 lb

## 2021-01-04 DIAGNOSIS — I10 Essential (primary) hypertension: Secondary | ICD-10-CM | POA: Diagnosis not present

## 2021-01-04 DIAGNOSIS — M5416 Radiculopathy, lumbar region: Secondary | ICD-10-CM | POA: Diagnosis not present

## 2021-01-04 DIAGNOSIS — M5126 Other intervertebral disc displacement, lumbar region: Secondary | ICD-10-CM

## 2021-01-04 DIAGNOSIS — E559 Vitamin D deficiency, unspecified: Secondary | ICD-10-CM

## 2021-01-04 DIAGNOSIS — E785 Hyperlipidemia, unspecified: Secondary | ICD-10-CM | POA: Diagnosis not present

## 2021-01-04 DIAGNOSIS — K219 Gastro-esophageal reflux disease without esophagitis: Secondary | ICD-10-CM | POA: Diagnosis not present

## 2021-01-04 DIAGNOSIS — E663 Overweight: Secondary | ICD-10-CM

## 2021-01-04 NOTE — Progress Notes (Signed)
Subjective:    Patient ID: Cheryl Lee, female    DOB: May 08, 1946, 75 y.o.   MRN: [id number]  Chief Complaint  Patient presents with   Medical Management of Chronic Issues   Pt presents to the office today for chronic follow up.  She has a hx of closed compression fracture and bulging lumbar disc. She continues to have intermittent aching pain of  3 out 10 when walking for a long distance, but 0 out 10 when sitting.    She reports she was walking and her feet "slipped" and she fell forward two weeks ago. She states she is ok and had a few bruises on her knees and face. These are much better now.  Hypertension This is a chronic problem. The current episode started more than 1 year ago. The problem has been resolved since onset. The problem is controlled. Pertinent negatives include no malaise/fatigue, peripheral edema or shortness of breath. Risk factors for coronary artery disease include obesity. The current treatment provides moderate improvement.  Gastroesophageal Reflux She reports no belching, no coughing or no heartburn. This is a chronic problem. The current episode started more than 1 year ago. The problem occurs occasionally. She has tried a PPI for the symptoms. The treatment provided moderate relief.  Hyperlipidemia This is a chronic problem. The current episode started more than 1 year ago. Exacerbating diseases include obesity. Pertinent negatives include no shortness of breath. Current antihyperlipidemic treatment includes statins. The current treatment provides moderate improvement of lipids. Risk factors for coronary artery disease include dyslipidemia, a sedentary lifestyle, post-menopausal and hypertension.  Back Pain This is a chronic problem. The current episode started more than 1 year ago. The problem occurs intermittently. The pain is present in the lumbar spine.     Review of Systems  Constitutional:  Negative for malaise/fatigue.  Respiratory:  Negative for  cough and shortness of breath.   Gastrointestinal:  Negative for heartburn.  Musculoskeletal:  Positive for back pain.  All other systems reviewed and are negative.     Objective:   Physical Exam Vitals reviewed.  Constitutional:      General: She is not in acute distress.    Appearance: She is well-developed. She is obese.  HENT:     Head: Normocephalic and atraumatic.     Right Ear: Tympanic membrane normal.     Left Ear: Tympanic membrane normal.  Eyes:     Pupils: Pupils are equal, round, and reactive to light.  Neck:     Thyroid: No thyromegaly.  Cardiovascular:     Rate and Rhythm: Normal rate and regular rhythm.     Heart sounds: Normal heart sounds. No murmur heard. Pulmonary:     Effort: Pulmonary effort is normal. No respiratory distress.     Breath sounds: Normal breath sounds. No wheezing.  Abdominal:     General: Bowel sounds are normal. There is no distension.     Palpations: Abdomen is soft.     Tenderness: There is no abdominal tenderness.  Musculoskeletal:        General: No tenderness. Normal range of motion.     Cervical back: Normal range of motion and neck supple.  Skin:    General: Skin is warm and dry.  Neurological:     Mental Status: She is alert and oriented to person, place, and time.     Cranial Nerves: No cranial nerve deficit.     Deep Tendon Reflexes: Reflexes are normal and symmetric.  Psychiatric:  Behavior: Behavior normal.        Thought Content: Thought content normal.        Judgment: Judgment normal.      BP 131/76   Pulse 89   Temp 97.9 F (36.6 C)   Ht 5' (1.524 m)   Wt 143 lb 9.6 oz (65.1 kg)   LMP 08/05/1991   SpO2 98%   BMI 28.04 kg/m      Assessment & Plan:  AICIA BABINSKI comes in today with chief complaint of Medical Management of Chronic Issues   Diagnosis and orders addressed:  1. Essential hypertension - CBC with Differential/Platelet - CMP14+EGFR  2. Hyperlipidemia with target LDL less than  100 - CBC with Differential/Platelet - CMP14+EGFR  3. Primary hypertension - CBC with Differential/Platelet - CMP14+EGFR  4. Gastroesophageal reflux disease without esophagitis - CBC with Differential/Platelet - CMP14+EGFR  5. Lumbar nerve root impingement - CBC with Differential/Platelet - CMP14+EGFR  6. HNP (herniated nucleus pulposus), lumbar - CBC with Differential/Platelet - CMP14+EGFR  7. Overweight (BMI 25.0-29.9) - CBC with Differential/Platelet - CMP14+EGFR  8. Vitamin D deficiency - CBC with Differential/Platelet - CMP14+EGFR   Labs pending Health Maintenance reviewed Diet and exercise encouraged  Follow up plan: 6 months    Evelina Dun, FNP

## 2021-01-04 NOTE — Patient Instructions (Signed)

## 2021-01-05 LAB — CMP14+EGFR
ALT: 15 IU/L (ref 0–32)
AST: 15 IU/L (ref 0–40)
Albumin/Globulin Ratio: 1.9 (ref 1.2–2.2)
Albumin: 4.3 g/dL (ref 3.7–4.7)
Alkaline Phosphatase: 80 IU/L (ref 44–121)
BUN/Creatinine Ratio: 16 (ref 12–28)
BUN: 16 mg/dL (ref 8–27)
Bilirubin Total: 0.3 mg/dL (ref 0.0–1.2)
CO2: 21 mmol/L (ref 20–29)
Calcium: 9.8 mg/dL (ref 8.7–10.3)
Chloride: 105 mmol/L (ref 96–106)
Creatinine, Ser: 0.99 mg/dL (ref 0.57–1.00)
Globulin, Total: 2.3 g/dL (ref 1.5–4.5)
Glucose: 93 mg/dL (ref 65–99)
Potassium: 4.9 mmol/L (ref 3.5–5.2)
Sodium: 143 mmol/L (ref 134–144)
Total Protein: 6.6 g/dL (ref 6.0–8.5)
eGFR: 60 mL/min/{1.73_m2} (ref >59)

## 2021-01-05 LAB — CBC WITH DIFFERENTIAL/PLATELET
Basophils Absolute: 0.1 10*3/uL (ref 0.0–0.2)
Basos: 1 %
EOS (ABSOLUTE): 0.2 10*3/uL (ref 0.0–0.4)
Eos: 2 %
Hematocrit: 41.9 % (ref 34.0–46.6)
Hemoglobin: 13.6 g/dL (ref 11.1–15.9)
Immature Grans (Abs): 0 10*3/uL (ref 0.0–0.1)
Immature Granulocytes: 0 %
Lymphocytes Absolute: 1.9 10*3/uL (ref 0.7–3.1)
Lymphs: 22 %
MCH: 30.6 pg (ref 26.6–33.0)
MCHC: 32.5 g/dL (ref 31.5–35.7)
MCV: 94 fL (ref 79–97)
Monocytes Absolute: 0.6 10*3/uL (ref 0.1–0.9)
Monocytes: 7 %
Neutrophils Absolute: 6.2 10*3/uL (ref 1.4–7.0)
Neutrophils: 68 %
Platelets: 333 10*3/uL (ref 150–450)
RBC: 4.45 x10E6/uL (ref 3.77–5.28)
RDW: 12.1 % (ref 11.7–15.4)
WBC: 9 10*3/uL (ref 3.4–10.8)

## 2021-01-06 ENCOUNTER — Ambulatory Visit (INDEPENDENT_AMBULATORY_CARE_PROVIDER_SITE_OTHER): Payer: Medicare Other

## 2021-01-06 VITALS — Ht 60.0 in | Wt 142.0 lb

## 2021-01-06 DIAGNOSIS — Z0000 Encounter for general adult medical examination without abnormal findings: Secondary | ICD-10-CM

## 2021-01-06 NOTE — Patient Instructions (Signed)
Cheryl Lee , Thank you for taking time to come for your Medicare Wellness Visit. I appreciate your ongoing commitment to your health goals. Please review the following plan we discussed and let me know if I can assist you in the future.   Screening recommendations/referrals: Colonoscopy: Done 01/21/2020 - Repeat in 5 years Mammogram: Done 03/11/2020 - next appointment 04/12/21 Bone Density: Done 01/28/2020 - Repeat every 2 years Recommended yearly ophthalmology/optometry visit for glaucoma screening and checkup Recommended yearly dental visit for hygiene and checkup  Vaccinations: Influenza vaccine: Declined Pneumococcal vaccine: Done 08/30/2012 & 09/03/2014 Tdap vaccine: Done 01/01/2012 -Repeat in 10 years Shingles vaccine: Zostavax done 2013, Shingrix discussed. Please contact your pharmacy for coverage information.     Covid-19: Declined  Advanced directives: Please bring a copy of your health care power of attorney and living will to the office to be added to your chart at your convenience.   Conditions/risks identified: Aim for 30 minutes of exercise or brisk walking each day, drink 6-8 glasses of water and eat lots of fruits and vegetables.   Next appointment: Follow up in one year for your annual wellness visit    Preventive Care 65 Years and Older, Female Preventive care refers to lifestyle choices and visits with your health care provider that can promote health and wellness. What does preventive care include? A yearly physical exam. This is also called an annual well check. Dental exams once or twice a year. Routine eye exams. Ask your health care provider how often you should have your eyes checked. Personal lifestyle choices, including: Daily care of your teeth and gums. Regular physical activity. Eating a healthy diet. Avoiding tobacco and drug use. Limiting alcohol use. Practicing safe sex. Taking low-dose aspirin every day. Taking vitamin and mineral supplements as  recommended by your health care provider. What happens during an annual well check? The services and screenings done by your health care provider during your annual well check will depend on your age, overall health, lifestyle risk factors, and family history of disease. Counseling  Your health care provider may ask you questions about your: Alcohol use. Tobacco use. Drug use. Emotional well-being. Home and relationship well-being. Sexual activity. Eating habits. History of falls. Memory and ability to understand (cognition). Work and work Statistician. Reproductive health. Screening  You may have the following tests or measurements: Height, weight, and BMI. Blood pressure. Lipid and cholesterol levels. These may be checked every 5 years, or more frequently if you are over 74 years old. Skin check. Lung cancer screening. You may have this screening every year starting at age 16 if you have a 30-pack-year history of smoking and currently smoke or have quit within the past 15 years. Fecal occult blood test (FOBT) of the stool. You may have this test every year starting at age 54. Flexible sigmoidoscopy or colonoscopy. You may have a sigmoidoscopy every 5 years or a colonoscopy every 10 years starting at age 50. Hepatitis C blood test. Hepatitis B blood test. Sexually transmitted disease (STD) testing. Diabetes screening. This is done by checking your blood sugar (glucose) after you have not eaten for a while (fasting). You may have this done every 1-3 years. Bone density scan. This is done to screen for osteoporosis. You may have this done starting at age 57. Mammogram. This may be done every 1-2 years. Talk to your health care provider about how often you should have regular mammograms. Talk with your health care provider about your test results, treatment options,  and if necessary, the need for more tests. Vaccines  Your health care provider may recommend certain vaccines, such  as: Influenza vaccine. This is recommended every year. Tetanus, diphtheria, and acellular pertussis (Tdap, Td) vaccine. You may need a Td booster every 10 years. Zoster vaccine. You may need this after age 75. Pneumococcal 13-valent conjugate (PCV13) vaccine. One dose is recommended after age 98. Pneumococcal polysaccharide (PPSV23) vaccine. One dose is recommended after age 60. Talk to your health care provider about which screenings and vaccines you need and how often you need them. This information is not intended to replace advice given to you by your health care provider. Make sure you discuss any questions you have with your health care provider. Document Released: 05/27/2015 Document Revised: 01/18/2016 Document Reviewed: 03/01/2015 Elsevier Interactive Patient Education  2017 [place] Prevention in the Home Falls can cause injuries. They can happen to people of all ages. There are many things you can do to make your home safe and to help prevent falls. What can I do on the outside of my home? Regularly fix the edges of walkways and driveways and fix any cracks. Remove anything that might make you trip as you walk through a door, such as a raised step or threshold. Trim any bushes or trees on the path to your home. Use bright outdoor lighting. Clear any walking paths of anything that might make someone trip, such as rocks or tools. Regularly check to see if handrails are loose or broken. Make sure that both sides of any steps have handrails. Any raised decks and porches should have guardrails on the edges. Have any leaves, snow, or ice cleared regularly. Use sand or salt on walking paths during winter. Clean up any spills in your garage right away. This includes oil or grease spills. What can I do in the bathroom? Use night lights. Install grab bars by the toilet and in the tub and shower. Do not use towel bars as grab bars. Use non-skid mats or decals in the tub or  shower. If you need to sit down in the shower, use a plastic, non-slip stool. Keep the floor dry. Clean up any water that spills on the floor as soon as it happens. Remove soap buildup in the tub or shower regularly. Attach bath mats securely with double-sided non-slip rug tape. Do not have throw rugs and other things on the floor that can make you trip. What can I do in the bedroom? Use night lights. Make sure that you have a light by your bed that is easy to reach. Do not use any sheets or blankets that are too big for your bed. They should not hang down onto the floor. Have a firm chair that has side arms. You can use this for support while you get dressed. Do not have throw rugs and other things on the floor that can make you trip. What can I do in the kitchen? Clean up any spills right away. Avoid walking on wet floors. Keep items that you use a lot in easy-to-reach places. If you need to reach something above you, use a strong step stool that has a grab bar. Keep electrical cords out of the way. Do not use floor polish or wax that makes floors slippery. If you must use wax, use non-skid floor wax. Do not have throw rugs and other things on the floor that can make you trip. What can I do with my stairs? Do not leave  any items on the stairs. Make sure that there are handrails on both sides of the stairs and use them. Fix handrails that are broken or loose. Make sure that handrails are as long as the stairways. Check any carpeting to make sure that it is firmly attached to the stairs. Fix any carpet that is loose or worn. Avoid having throw rugs at the top or bottom of the stairs. If you do have throw rugs, attach them to the floor with carpet tape. Make sure that you have a light switch at the top of the stairs and the bottom of the stairs. If you do not have them, ask someone to add them for you. What else can I do to help prevent falls? Wear shoes that: Do not have high heels. Have  rubber bottoms. Are comfortable and fit you well. Are closed at the toe. Do not wear sandals. If you use a stepladder: Make sure that it is fully opened. Do not climb a closed stepladder. Make sure that both sides of the stepladder are locked into place. Ask someone to hold it for you, if possible. Clearly mark and make sure that you can see: Any grab bars or handrails. First and last steps. Where the edge of each step is. Use tools that help you move around (mobility aids) if they are needed. These include: Canes. Walkers. Scooters. Crutches. Turn on the lights when you go into a dark area. Replace any light bulbs as soon as they burn out. Set up your furniture so you have a clear path. Avoid moving your furniture around. If any of your floors are uneven, fix them. If there are any pets around you, be aware of where they are. Review your medicines with your doctor. Some medicines can make you feel dizzy. This can increase your chance of falling. Ask your doctor what other things that you can do to help prevent falls. This information is not intended to replace advice given to you by your health care provider. Make sure you discuss any questions you have with your health care provider. Document Released: 02/24/2009 Document Revised: 10/06/2015 Document Reviewed: 06/04/2014 Elsevier Interactive Patient Education  2017 [employer].

## 2021-01-06 NOTE — Progress Notes (Signed)
Subjective:   Cheryl Lee is a 75 y.o. female who presents for Medicare Annual (Subsequent) preventive examination.  Virtual Visit via Telephone Note  I connected with  Cheryl Lee on 01/06/21 at 10:30 AM EDT by telephone and verified that I am speaking with the correct person using two identifiers.  Location: Patient: Home Provider: [facility] Persons participating in the virtual visit: patient/Nurse Health Advisor   I discussed the limitations, risks, security and privacy concerns of performing an evaluation and management service by telephone and the availability of in person appointments. The patient expressed understanding and agreed to proceed.  Interactive audio and video telecommunications were attempted between this nurse and patient, however failed, due to patient having technical difficulties OR patient did not have access to video capability.  We continued and completed visit with audio only.  Some vital signs may be absent or patient reported.   Cheryl Lee E Cheryl Landers, LPN   Review of Systems     Cardiac Risk Factors include: advanced age (>49mn, >>26women);dyslipidemia     Objective:    Today's Vitals   01/06/21 1018  Weight: 142 lb (64.4 kg)  Height: 5' (1.524 m)   Body mass index is 27.73 kg/m.  Advanced Directives 01/06/2021 01/21/2020 01/06/2020 01/05/2019 12/31/2017 06/11/2017 02/17/2017  Does Patient Have a Medical Advance Directive? No No No No No No No  Would patient like information on creating a medical advance directive? No - Patient declined No - Patient declined No - Patient declined No - Patient declined No - Patient declined No - Patient declined No - Patient declined    Current Medications (verified) Outpatient Encounter Medications as of 01/06/2021  Medication Sig   alendronate (FOSAMAX) 70 MG tablet Take 1 tablet (70 mg total) by mouth every Monday. Take with a full glass of water on an empty stomach.   aspirin 81 MG tablet Take 1 tablet (81 mg total)  by mouth daily.   atorvastatin (LIPITOR) 10 MG tablet Take 1 tablet (10 mg total) by mouth daily.   fluticasone (FLONASE) 50 MCG/ACT nasal spray Place 2 sprays into both nostrils daily.   ibuprofen (ADVIL,MOTRIN) 200 MG tablet Take 200 mg by mouth daily. Take an additional 200 mg of needed for Sinus problem   ipratropium (ATROVENT) 0.03 % nasal spray Place 2 sprays into both nostrils 2 (two) times daily as needed for rhinitis.   lisinopril-hydrochlorothiazide (ZESTORETIC) 20-12.5 MG tablet Take 0.5 tablets by mouth daily.   Multiple Vitamins-Minerals (MULTIVITAMINS THER. W/MINERALS) TABS Take 1 tablet by mouth daily.   omeprazole (PRILOSEC) 20 MG capsule Take 1 capsule (20 mg total) by mouth daily.   Polyethyl Glycol-Propyl Glycol (SYSTANE) 0.4-0.3 % SOLN Place 1 drop into both eyes daily as needed (Dry eye).   vitamin C (ASCORBIC ACID) 500 MG tablet Take 500 mg by mouth daily.   No facility-administered encounter medications on file as of 01/06/2021.    Allergies (verified) Lycopene   History: Past Medical History:  Diagnosis Date   Acid reflux    Arthritis    HNP (herniated nucleus pulposus), lumbar    Hypercholesteremia    Hypertension    Osteoporosis    Sinus congestion    Past Surgical History:  Procedure Laterality Date   CATARACT EXTRACTION     COLONOSCOPY     COLONOSCOPY N/A 01/21/2020   Procedure: COLONOSCOPY;  Surgeon: RRogene Houston MD;  [facility];  Service: Endoscopy;  Laterality: N/A;  730   LUMBAR LAMINECTOMY/DECOMPRESSION  MICRODISCECTOMY Left 05/01/2017   Procedure: Microdiscectomy - Lumbar four-five  - left;  Surgeon: Kary Kos, MD;  Location: [facility];  Service: Neurosurgery;  Laterality: Left;   POLYPECTOMY  01/21/2020   Procedure: POLYPECTOMY;  Surgeon: Rogene Houston, MD;  [facility]: [facility];  Service: Endoscopy;;   Right snkle repair     Family History  Problem Relation Age of Onset   Cancer Mother    Alzheimer's disease Mother     Heart disease Mother    Early death Father    Diabetes Sister    Stroke Brother    Diabetes Sister    Cancer Sister 63       uterine   Hypertension Sister    Healthy Daughter    Social History   Socioeconomic History   Marital status: Divorced    Spouse name: Not on file   Number of children: 1   Years of education: 12   Highest education level: 12th grade  Occupational History   Occupation: retired  Tobacco Use   Smoking status: Never   Smokeless tobacco: Never  Scientific laboratory technician Use: Never used  Substance and Sexual Activity   Alcohol use: No   Drug use: No   Sexual activity: Not Currently  Other Topics Concern   Not on file  Social History Narrative   Lives alone. Daughter lives nearby   Brother lives next door.   Social Determinants of Health   Financial Resource Strain: Low Risk    Difficulty of Paying Living Expenses: Not hard at all  Food Insecurity: No Food Insecurity   Worried About Charity fundraiser in the Last Year: Never true   [place] in the Last Year: Never true  Transportation Needs: No Transportation Needs   Lack of Transportation (Medical): No   Lack of Transportation (Non-Medical): No  Physical Activity: Sufficiently Active   Days of Exercise per Week: 6 days   Minutes of Exercise per Session: 30 min  Stress: No Stress Concern Present   Feeling of Stress : Not at all  Social Connections: Moderately Integrated   Frequency of Communication with Friends and Family: More than three times a week   Frequency of Social Gatherings with Friends and Family: More than three times a week   Attends Religious Services: More than 4 times per year   Active Member of [employer] or Organizations: Yes   Attends Music therapist: More than 4 times per year   Marital Status: Divorced    Tobacco Counseling Counseling given: Not Answered   Clinical Intake:  Pre-visit preparation completed: Yes  Pain : No/denies pain     BMI -  recorded: 27.73 Nutritional Status: BMI 25 -29 Overweight Nutritional Risks: None Diabetes: No  How often do you need to have someone help you when you read instructions, pamphlets, or other written materials from your doctor or pharmacy?: 1 - Never  Diabetic? No  Interpreter Needed?: No  Information entered by :: Jozi Malachi, LPN   Activities of Daily Living In your present state of health, do you have any difficulty performing the following activities: 01/06/2021  Hearing? N  Vision? N  Difficulty concentrating or making decisions? N  Walking or climbing stairs? N  Comment she can, but she tries to avoid  Dressing or bathing? N  Doing errands, shopping? N  Preparing Food and eating ? N  Using the Toilet? N  In the past six months, have you  accidently leaked urine? N  Do you have problems with loss of bowel control? N  Managing your Medications? N  Managing your Finances? N  Housekeeping or managing your Housekeeping? N  Some recent data might be hidden    Patient Care Team: Sharion Balloon, FNP as PCP - General (Family Medicine) Harlen Labs, MD as Referring Physician (Optometry) Laural Golden, Mechele Dawley, MD as Consulting Physician (Gastroenterology)  Indicate any recent Medical Services you may have received from other than [facility] providers in the past year (date may be approximate).     Assessment:   This is a routine wellness examination for Dalarie.  Hearing/Vision screen Hearing Screening - Comments:: Denies hearing difficulties  Vision Screening - Comments:: Wears glasses; blind in right eye - up to date with annual eye exams with Dr Marin Comment in [place]  Dietary issues and exercise activities discussed: Current Exercise Habits: Home exercise routine, Type of exercise: walking, Time (Minutes): 30, Frequency (Times/Week): 6, Weekly Exercise (Minutes/Week): 180, Intensity: Mild, Exercise limited by: orthopedic condition(s)   Goals Addressed             This Visit's  Progress    Exercise 150 min/wk Moderate Activity   On track      Depression Screen PHQ 2/9 Scores 01/06/2021 01/04/2021 07/07/2020 01/06/2020 01/04/2020 07/08/2019 01/05/2019  PHQ - 2 Score 0 0 0 0 0 0 0  PHQ- 9 Score - - - 0 0 - -    Fall Risk Fall Risk  01/06/2021 01/04/2021 07/07/2020 01/06/2020 01/04/2020  Falls in the past year? 1 1 0 0 0  Number falls in past yr: 0 0 - - -  Injury with Fall? 0 0 - - -  Risk Factor Category  - - - - -  Risk for fall due to : History of fall(s);Impaired balance/gait;Orthopedic patient;Impaired vision History of fall(s) - - -  Follow up Education provided;Falls prevention discussed Falls evaluation completed - - -  Comment - - - - -    FALL RISK PREVENTION PERTAINING TO THE HOME:  Any stairs in or around the home? Yes  If so, are there any without handrails? No  Home free of loose throw rugs in walkways, pet beds, electrical cords, etc? Yes  Adequate lighting in your home to reduce risk of falls? Yes   ASSISTIVE DEVICES UTILIZED TO PREVENT FALLS:  Life alert? No  Use of a cane, walker or w/c? Yes  Grab bars in the bathroom? No  Shower chair or bench in shower? No  Elevated toilet seat or a handicapped toilet? No   TIMED UP AND GO:  Was the test performed? No . Telephonic visit  Cognitive Function: MMSE - Mini Mental State Exam 12/31/2017 02/28/2015  Orientation to time 5 5  Orientation to Place 5 5  Registration 3 3  Attention/ Calculation 5 5  Recall 3 3  Language- name 2 objects 2 2  Language- repeat 1 1  Language- follow 3 step command 3 3  Language- read & follow direction 1 1  Write a sentence 1 1  Copy design 1 1  Total score 30 30     6CIT Screen 01/06/2021 01/06/2020 01/05/2019  What Year? 0 points 0 points 0 points  What month? 0 points 0 points 0 points  What time? 0 points 0 points 0 points  Count back from 20 0 points 0 points 0 points  Months in reverse 2 points 0 points 2 points  Repeat phrase 0 points  0 points 0 points   Total Score 2 0 2    Immunizations Immunization History  Administered Date(s) Administered   Pneumococcal Conjugate-13 09/03/2014   Pneumococcal Polysaccharide-23 08/30/2012   Tdap 01/01/2012   Zoster, Live 01/01/2012    TDAP status: Up to date  Flu Vaccine status: Declined, Education has been provided regarding the importance of this vaccine but patient still declined. Advised may receive this vaccine at local pharmacy or Health Dept. Aware to provide a copy of the vaccination record if obtained from local pharmacy or Health Dept. Verbalized acceptance and understanding.  Pneumococcal vaccine status: Up to date  Covid-19 vaccine status: Declined, Education has been provided regarding the importance of this vaccine but patient still declined. Advised may receive this vaccine at local pharmacy or Health Dept.or vaccine clinic. Aware to provide a copy of the vaccination record if obtained from local pharmacy or Health Dept. Verbalized acceptance and understanding.  Qualifies for Shingles Vaccine? Yes   Zostavax completed Yes   Shingrix Completed?: No.    Education has been provided regarding the importance of this vaccine. Patient has been advised to call insurance company to determine out of pocket expense if they have not yet received this vaccine. Advised may also receive vaccine at local pharmacy or Health Dept. Verbalized acceptance and understanding.  Screening Tests Health Maintenance  Topic Date Due   Zoster Vaccines- Shingrix (1 of 2) Never done   COVID-19 Vaccine (1) 01/20/2021 (Originally 03/19/1951)   INFLUENZA VACCINE  08/11/2021 (Originally 12/12/2020)   TETANUS/TDAP  12/31/2021   MAMMOGRAM  03/11/2022   COLONOSCOPY (Pts 45-2yr Insurance coverage will need to be confirmed)  01/20/2025   DEXA SCAN  Completed   Hepatitis C Screening  Completed   PNA vac Low Risk Adult  Completed   HPV VACCINES  Aged Out    Health Maintenance  Health Maintenance Due  Topic Date Due    Zoster Vaccines- Shingrix (1 of 2) Never done    Colorectal cancer screening: Type of screening: Colonoscopy. Completed 01/21/2020. Repeat every 5 years  Mammogram status: Completed 03/11/2020. Repeat every year  Bone Density status: Completed 01/28/2020. Results reflect: Bone density results: OSTEOPENIA. Repeat every 2 years.  Lung Cancer Screening: (Low Dose CT Chest recommended if Age 75-80years, 30 pack-year currently smoking OR have quit w/in 15years.) does not qualify.   Additional Screening:  Hepatitis C Screening: does qualify; Completed 03/31/2015  Vision Screening: Recommended annual ophthalmology exams for early detection of glaucoma and other disorders of the eye. Is the patient up to date with their annual eye exam?  Yes  Who is the provider or what is the name of the office in which the patient attends annual eye exams? [facility] SarIf pt is not established with a provider, would they like to be referred to a provider to establish care? No .   Dental Screening: Recommended annual dental exams for proper oral hygiene  Community Resource Referral / Chronic Care Management: CRR required this visit?  No   CCM required this visit?  No      Plan:     I have personally reviewed and noted the following in the patient's chart:   Medical and social history Use of alcohol, tobacco or illicit drugs  Current medications and supplements including opioid prescriptions.  Functional ability and status Nutritional status Physical activity Advanced directives List of other physicians Hospitalizations, surgeries, and ER visits in previous 12 months Vitals Screenings to include cognitive, depression, and falls Referrals  and appointments  In addition, I have reviewed and discussed with patient certain preventive protocols, quality metrics, and best practice recommendations. A written personalized care plan for preventive services as well as general preventive health recommendations  were provided to patient.     Sandrea Hammond, LPN   [id number]   Nurse Notes: None

## 2021-01-17 ENCOUNTER — Other Ambulatory Visit: Payer: Self-pay | Admitting: Family

## 2021-01-17 DIAGNOSIS — Z1231 Encounter for screening mammogram for malignant neoplasm of breast: Secondary | ICD-10-CM

## 2021-04-12 ENCOUNTER — Other Ambulatory Visit: Payer: Self-pay

## 2021-04-12 ENCOUNTER — Ambulatory Visit
Admission: RE | Admit: 2021-04-12 | Discharge: 2021-04-12 | Disposition: A | Discharge status: Home / Self Care | Payer: Medicare Other | Source: Ambulatory Visit | Attending: Family | Admitting: Family

## 2021-04-12 DIAGNOSIS — Z1231 Encounter for screening mammogram for malignant neoplasm of breast: Secondary | ICD-10-CM

## 2021-07-07 ENCOUNTER — Encounter: Payer: Self-pay | Admitting: Family

## 2021-07-07 ENCOUNTER — Ambulatory Visit (INDEPENDENT_AMBULATORY_CARE_PROVIDER_SITE_OTHER): Payer: Medicare Other | Admitting: Family

## 2021-07-07 VITALS — BP 110/74 | HR 93 | Temp 97.7°F | Ht 60.0 in | Wt 146.6 lb

## 2021-07-07 DIAGNOSIS — M5416 Radiculopathy, lumbar region: Secondary | ICD-10-CM

## 2021-07-07 DIAGNOSIS — M5136 Other intervertebral disc degeneration, lumbar region: Secondary | ICD-10-CM

## 2021-07-07 DIAGNOSIS — E559 Vitamin D deficiency, unspecified: Secondary | ICD-10-CM

## 2021-07-07 DIAGNOSIS — K219 Gastro-esophageal reflux disease without esophagitis: Secondary | ICD-10-CM | POA: Diagnosis not present

## 2021-07-07 DIAGNOSIS — Z23 Encounter for immunization: Secondary | ICD-10-CM

## 2021-07-07 DIAGNOSIS — M810 Age-related osteoporosis without current pathological fracture: Secondary | ICD-10-CM | POA: Diagnosis not present

## 2021-07-07 DIAGNOSIS — E785 Hyperlipidemia, unspecified: Secondary | ICD-10-CM

## 2021-07-07 DIAGNOSIS — I10 Essential (primary) hypertension: Secondary | ICD-10-CM | POA: Diagnosis not present

## 2021-07-07 DIAGNOSIS — E663 Overweight: Secondary | ICD-10-CM

## 2021-07-07 LAB — CBC WITH DIFFERENTIAL/PLATELET
Basophils Absolute: 0.1 10*3/uL (ref 0.0–0.2)
Basos: 1 %
EOS (ABSOLUTE): 0.1 10*3/uL (ref 0.0–0.4)
Eos: 1 %
Hematocrit: 41.6 % (ref 34.0–46.6)
Hemoglobin: 13.7 g/dL (ref 11.1–15.9)
Immature Grans (Abs): 0 10*3/uL (ref 0.0–0.1)
Immature Granulocytes: 0 %
Lymphocytes Absolute: 2.4 10*3/uL (ref 0.7–3.1)
Lymphs: 22 %
MCH: 30.9 pg (ref 26.6–33.0)
MCHC: 32.9 g/dL (ref 31.5–35.7)
MCV: 94 fL (ref 79–97)
Monocytes Absolute: 0.7 10*3/uL (ref 0.1–0.9)
Monocytes: 6 %
Neutrophils Absolute: 7.7 10*3/uL — ABNORMAL HIGH (ref 1.4–7.0)
Neutrophils: 70 %
Platelets: 370 10*3/uL (ref 150–450)
RBC: 4.44 x10E6/uL (ref 3.77–5.28)
RDW: 12.2 % (ref 11.7–15.4)
WBC: 10.9 10*3/uL — ABNORMAL HIGH (ref 3.4–10.8)

## 2021-07-07 LAB — CMP14+EGFR
ALT: 15 IU/L (ref 0–32)
AST: 13 IU/L (ref 0–40)
Albumin/Globulin Ratio: 1.9 (ref 1.2–2.2)
Albumin: 4.3 g/dL (ref 3.7–4.7)
Alkaline Phosphatase: 80 IU/L (ref 44–121)
BUN/Creatinine Ratio: 22 (ref 12–28)
BUN: 25 mg/dL (ref 8–27)
Bilirubin Total: 0.3 mg/dL (ref 0.0–1.2)
CO2: 24 mmol/L (ref 20–29)
Calcium: 10.3 mg/dL (ref 8.7–10.3)
Chloride: 102 mmol/L (ref 96–106)
Creatinine, Ser: 1.15 mg/dL — ABNORMAL HIGH (ref 0.57–1.00)
Globulin, Total: 2.3 g/dL (ref 1.5–4.5)
Glucose: 99 mg/dL (ref 70–99)
Potassium: 4.9 mmol/L (ref 3.5–5.2)
Sodium: 141 mmol/L (ref 134–144)
Total Protein: 6.6 g/dL (ref 6.0–8.5)
eGFR: 50 mL/min/{1.73_m2} — ABNORMAL LOW (ref >59)

## 2021-07-07 LAB — LIPID PANEL
Chol/HDL Ratio: 3.3 ratio (ref 0.0–4.4)
Cholesterol, Total: 163 mg/dL (ref 100–199)
HDL: 49 mg/dL (ref >39)
LDL Chol Calc (NIH): 93 mg/dL (ref 0–99)
Triglycerides: 116 mg/dL (ref 0–149)
VLDL Cholesterol Cal: 21 mg/dL (ref 5–40)

## 2021-07-07 MED ORDER — ALENDRONATE SODIUM 70 MG PO TABS: 70.0000 mg | ORAL_TABLET | ORAL | 3 refills | Status: DC

## 2021-07-07 MED ORDER — FLUTICASONE PROPIONATE 50 MCG/ACT NA SUSP: 2.0000 | Freq: Every day | NASAL | 3 refills | Status: DC

## 2021-07-07 MED ORDER — OMEPRAZOLE 20 MG PO CPDR: 20.0000 mg | DELAYED_RELEASE_CAPSULE | Freq: Every day | ORAL | 3 refills | Status: DC

## 2021-07-07 MED ORDER — ATORVASTATIN CALCIUM 10 MG PO TABS: 10.0000 mg | ORAL_TABLET | Freq: Every day | ORAL | 3 refills | Status: DC

## 2021-07-07 MED ORDER — LISINOPRIL-HYDROCHLOROTHIAZIDE 20-12.5 MG PO TABS: 0.5000 | ORAL_TABLET | Freq: Every day | ORAL | 3 refills | Status: DC

## 2021-07-07 NOTE — Patient Instructions (Signed)
Osteoporosis Osteoporosis happens when the bones become thin and less dense than normal. Osteoporosis makes bones more brittle and fragile and more likely to break (fracture). Over time, osteoporosis can cause your bones to become so weak that they fracture after a minor fall. Bones in the hip, wrist, and spine are most likely to fracture due to osteoporosis. What are the causes? The exact cause of this condition is not known. What increases the risk? You are more likely to develop this condition if you: Have family members with this condition. Have poor nutrition. Use the following: Steroid medicines, such as prednisone. Anti-seizure medicines. Nicotine or tobacco, such as cigarettes, e-cigarettes, and chewing tobacco. Are female. Are age 76 or older. Are not physically active (are sedentary). Are of European or Asian descent. Have a small body frame. What are the signs or symptoms? A fracture might be the first sign of osteoporosis, especially if the fracture results from a fall or injury that usually would not cause a bone to break. Other signs and symptoms include: Pain in the neck or low back. Stooped posture. Loss of height. How is this diagnosed? This condition may be diagnosed based on: Your medical history. A physical exam. A bone mineral density test, also called a DXA or DEXA test (dual-energy X-ray absorptiometry test). This test uses X-rays to measure the amount of minerals in your bones. How is this treated? This condition may be treated by: Making lifestyle changes, such as: Including foods with more calcium and vitamin D in your diet. Doing weight-bearing and muscle-strengthening exercises. Stopping tobacco use. Limiting alcohol intake. Taking medicine to slow the process of bone loss or to increase bone density. Taking daily supplements of calcium and vitamin D. Taking hormone replacement medicines, such as estrogen for women and testosterone for men. Monitoring  your levels of calcium and vitamin D. The goal of treatment is to strengthen your bones and lower your risk for a fracture. Follow these instructions at home: Eating and drinking Include calcium and vitamin D in your diet. Calcium is important for bone health, and vitamin D helps your body absorb calcium. Good sources of calcium and vitamin D include: Certain fatty fish, such as salmon and tuna. Products that have calcium and vitamin D added to them (are fortified), such as fortified cereals. Egg yolks. Cheese. Liver.  Activity Do exercises as told by your health care provider. Ask your health care provider what exercises and activities are safe for you. You should do: Exercises that make you work against gravity (weight-bearing exercises), such as tai chi, yoga, or walking. Exercises to strengthen muscles, such as lifting weights. Lifestyle Do not drink alcohol if: Your health care provider tells you not to drink. You are pregnant, may be pregnant, or are planning to become pregnant. If you drink alcohol: Limit how much you use to: 0-1 drink a day for women. 0-2 drinks a day for men. Know how much alcohol is in your drink. In the [place] one drink equals one 12 oz bottle of beer (355 mL), one 5 oz glass of wine (148 mL), or one 1 oz glass of hard liquor (44 mL). Do not use any products that contain nicotine or tobacco, such as cigarettes, e-cigarettes, and chewing tobacco. If you need help quitting, ask your health care provider. Preventing falls Use devices to help you move around (mobility aids) as needed, such as canes, walkers, scooters, or crutches. Keep rooms well-lit and clutter-free. Remove tripping hazards from walkways, including cords and throw  rugs. °Install grab bars in bathrooms and safety rails on stairs. °Use rubber mats in the bathroom and other areas that are often wet or slippery. °Wear closed-toe shoes that fit well and support your feet. Wear shoes that have rubber  soles or low heels. °Review your medicines with your health care provider. Some medicines can cause dizziness or changes in blood pressure, which can increase your risk of falling. °General instructions °Take over-the-counter and prescription medicines only as told by your health care provider. °Keep all follow-up visits. This is important. °Contact a health care provider if: °You have never been screened for osteoporosis and you are: °A woman who is age 76 or older. °A man who is age 76 or older. °Get help right away if: °You fall or injure yourself. °Summary °Osteoporosis is thinning and loss of density in your bones. This makes bones more brittle and fragile and more likely to break (fracture),even with minor falls. °The goal of treatment is to strengthen your bones and lower your risk for a fracture. °Include calcium and vitamin D in your diet. Calcium is important for bone health, and vitamin D helps your body absorb calcium. °Talk with your health care provider about screening for osteoporosis if you are a woman who is age 76 or older, or a man who is age 76 or older. provider about screening for osteoporosis if you are a woman who is age 65 or older, or a man who is age 76 or older. °This information is not intended to replace advice given to you by your health care provider. Make sure you discuss any questions you have with your health care provider. °Document Revised: 10/15/2019 Document Reviewed: 10/15/2019 °Elsevier Patient Education © 2022 [employer] ° °

## 2021-07-07 NOTE — Progress Notes (Signed)
Subjective:    Patient ID: Cheryl Lee, female    DOB: Jul 14, 1945, 76 y.o.   MRN: [id number]  Chief Complaint  Patient presents with   Medical Management of Chronic Issues   Pt presents to the office today for chronic follow up.  She has a hx of closed compression fracture and bulging lumbar disc. She continues to have intermittent aching pain of  3 out 10 when walking for a long distance, but 0 out 10 when sitting.     She has osteoporosis and takes fosamax weekly. Last dexa scan 01/27/20. Hypertension This is a chronic problem. The current episode started more than 1 year ago. The problem has been resolved since onset. The problem is controlled. Pertinent negatives include no malaise/fatigue, peripheral edema or shortness of breath. Risk factors for coronary artery disease include dyslipidemia, sedentary lifestyle and obesity. The current treatment provides moderate improvement.  Gastroesophageal Reflux She complains of belching and heartburn. This is a chronic problem. The current episode started more than 1 year ago. The problem occurs occasionally. The symptoms are aggravated by certain foods. Risk factors include obesity. She has tried a PPI and an antacid for the symptoms. The treatment provided moderate relief.  Back Pain This is a chronic problem. The current episode started more than 1 year ago. The problem occurs intermittently. The problem has been waxing and waning since onset. The pain is present in the lumbar spine. The quality of the pain is described as aching. The pain is at a severity of 3/10 (when walking).  Hyperlipidemia This is a chronic problem. The current episode started more than 1 year ago. Exacerbating diseases include obesity. Pertinent negatives include no shortness of breath. Current antihyperlipidemic treatment includes statins. The current treatment provides mild improvement of lipids. Risk factors for coronary artery disease include dyslipidemia,  hypertension, a sedentary lifestyle and post-menopausal.     Review of Systems  Constitutional:  Negative for malaise/fatigue.  Respiratory:  Negative for shortness of breath.   Gastrointestinal:  Positive for heartburn.  Musculoskeletal:  Positive for back pain.  All other systems reviewed and are negative.     Objective:   Physical Exam Vitals reviewed.  Constitutional:      General: She is not in acute distress.    Appearance: She is well-developed. She is obese.  HENT:     Head: Normocephalic and atraumatic.     Right Ear: Tympanic membrane normal.     Left Ear: Tympanic membrane normal.  Eyes:     Pupils: Pupils are equal, round, and reactive to light.  Neck:     Thyroid: No thyromegaly.  Cardiovascular:     Rate and Rhythm: Normal rate and regular rhythm.     Heart sounds: Normal heart sounds. No murmur heard. Pulmonary:     Effort: Pulmonary effort is normal. No respiratory distress.     Breath sounds: Normal breath sounds. No wheezing.  Abdominal:     General: Bowel sounds are normal. There is no distension.     Palpations: Abdomen is soft.     Tenderness: There is no abdominal tenderness.  Musculoskeletal:        General: No tenderness.     Cervical back: Normal range of motion and neck supple.     Comments: Mild pain in lumbar with flexion  Skin:    General: Skin is warm and dry.  Neurological:     Mental Status: She is alert and oriented to person, place, and time.  Cranial Nerves: No cranial nerve deficit.     Deep Tendon Reflexes: Reflexes are normal and symmetric.  Psychiatric:        Behavior: Behavior normal.        Thought Content: Thought content normal.        Judgment: Judgment normal.      BP 110/74    Pulse 93    Temp 97.7 F (36.5 C) (Temporal)    Ht 5' (1.524 m)    Wt 146 lb 9.6 oz (66.5 kg)    LMP 08/05/1991    BMI 28.63 kg/m      Assessment & Plan:  Cheryl Lee comes in today with chief complaint of Medical Management of  Chronic Issues   Diagnosis and orders addressed:  1. Age-related osteoporosis without current pathological fracture - alendronate (FOSAMAX) 70 MG tablet; Take 1 tablet (70 mg total) by mouth every Monday. Take with a full glass of water on an empty stomach.  Dispense: 13 tablet; Refill: 3 - CMP14+EGFR - CBC with Differential/Platelet  2. Gastroesophageal reflux disease without esophagitis - omeprazole (PRILOSEC) 20 MG capsule; Take 1 capsule (20 mg total) by mouth daily.  Dispense: 90 capsule; Refill: 3 - CMP14+EGFR - CBC with Differential/Platelet  3. Hyperlipidemia with target LDL less than 100 - atorvastatin (LIPITOR) 10 MG tablet; Take 1 tablet (10 mg total) by mouth daily.  Dispense: 90 tablet; Refill: 3 - CMP14+EGFR - CBC with Differential/Platelet - Lipid panel  4. Primary hypertension - lisinopril-hydrochlorothiazide (ZESTORETIC) 20-12.5 MG tablet; Take 0.5 tablets by mouth daily.  Dispense: 45 tablet; Refill: 3 - CMP14+EGFR - CBC with Differential/Platelet  5. Overweight (BMI 25.0-29.9)  - CMP14+EGFR - CBC with Differential/Platelet  6. Vitamin D deficiency - CMP14+EGFR - CBC with Differential/Platelet  7. DDD (degenerative disc disease), lumbar - CMP14+EGFR - CBC with Differential/Platelet  8. Lumbar nerve root impingement  - CMP14+EGFR - CBC with Differential/Platelet   Labs pending Health Maintenance reviewed Diet and exercise encouraged  Follow up plan: 6 months    Evelina Dun, FNP

## 2022-01-04 ENCOUNTER — Ambulatory Visit (INDEPENDENT_AMBULATORY_CARE_PROVIDER_SITE_OTHER): Payer: Medicare Other | Admitting: Family

## 2022-01-04 ENCOUNTER — Encounter: Payer: Self-pay | Admitting: Family

## 2022-01-04 VITALS — BP 127/79 | HR 92 | Temp 97.5°F | Ht 60.0 in | Wt 140.0 lb

## 2022-01-04 DIAGNOSIS — K219 Gastro-esophageal reflux disease without esophagitis: Secondary | ICD-10-CM

## 2022-01-04 DIAGNOSIS — R5383 Other fatigue: Secondary | ICD-10-CM | POA: Diagnosis not present

## 2022-01-04 DIAGNOSIS — M5416 Radiculopathy, lumbar region: Secondary | ICD-10-CM | POA: Diagnosis not present

## 2022-01-04 DIAGNOSIS — M810 Age-related osteoporosis without current pathological fracture: Secondary | ICD-10-CM | POA: Diagnosis not present

## 2022-01-04 DIAGNOSIS — E785 Hyperlipidemia, unspecified: Secondary | ICD-10-CM | POA: Diagnosis not present

## 2022-01-04 DIAGNOSIS — I10 Essential (primary) hypertension: Secondary | ICD-10-CM

## 2022-01-04 DIAGNOSIS — Z23 Encounter for immunization: Secondary | ICD-10-CM

## 2022-01-04 DIAGNOSIS — E559 Vitamin D deficiency, unspecified: Secondary | ICD-10-CM

## 2022-01-04 DIAGNOSIS — E663 Overweight: Secondary | ICD-10-CM | POA: Diagnosis not present

## 2022-01-04 DIAGNOSIS — M5136 Other intervertebral disc degeneration, lumbar region: Secondary | ICD-10-CM

## 2022-01-04 MED ORDER — ATORVASTATIN CALCIUM 10 MG PO TABS: 10.0000 mg | ORAL_TABLET | Freq: Every day | ORAL | 3 refills | Status: DC

## 2022-01-04 MED ORDER — LISINOPRIL-HYDROCHLOROTHIAZIDE 20-12.5 MG PO TABS: 0.5000 | ORAL_TABLET | Freq: Every day | ORAL | 3 refills | Status: DC

## 2022-01-04 MED ORDER — OMEPRAZOLE 20 MG PO CPDR: 20.0000 mg | DELAYED_RELEASE_CAPSULE | Freq: Every day | ORAL | 3 refills | Status: DC

## 2022-01-04 MED ORDER — FLUTICASONE PROPIONATE 50 MCG/ACT NA SUSP: 2.0000 | Freq: Every day | NASAL | 3 refills | Status: DC

## 2022-01-04 NOTE — Progress Notes (Signed)
Subjective:    Patient ID: Cheryl Lee, female    DOB: Jan 09, 1946, 76 y.o.   MRN: [id number]  Chief Complaint  Patient presents with   Medical Management of Chronic Issues    Patient states she givens out once being up on her feet awahile    Annual Exam   Pt presents to the office today for chronic follow up.  She has a hx of closed compression fracture and bulging lumbar disc. She continues to have intermittent aching pain of  3 out 10 when walking for a long distance, but 0 out 10 when sitting.     She has osteoporosis and takes fosamax weekly. Last dexa scan 01/27/20. Hypertension This is a chronic problem. The current episode started more than 1 year ago. The problem has been resolved since onset. The problem is controlled. Associated symptoms include malaise/fatigue. Pertinent negatives include no peripheral edema or shortness of breath. Risk factors for coronary artery disease include dyslipidemia, obesity and sedentary lifestyle. The current treatment provides moderate improvement. There is no history of heart failure.  Gastroesophageal Reflux She complains of belching and heartburn. This is a chronic problem. The current episode started more than 1 year ago. The problem occurs occasionally. The problem has been waxing and waning. Risk factors include obesity. She has tried a PPI for the symptoms. The treatment provided moderate relief.  Back Pain This is a chronic problem. The current episode started more than 1 year ago. The problem occurs intermittently. The pain is present in the lumbar spine. The quality of the pain is described as aching. Associated symptoms include leg pain and tingling. She has tried bed rest for the symptoms. The treatment provided moderate relief.  Hyperlipidemia This is a chronic problem. The current episode started more than 1 year ago. The problem is controlled. She has no history of obesity. Associated symptoms include leg pain. Pertinent negatives  include no shortness of breath. Current antihyperlipidemic treatment includes statins. The current treatment provides moderate improvement of lipids. Risk factors for coronary artery disease include dyslipidemia, hypertension, a sedentary lifestyle and post-menopausal.      Review of Systems  Constitutional:  Positive for malaise/fatigue.  Respiratory:  Negative for shortness of breath.   Gastrointestinal:  Positive for heartburn.  Musculoskeletal:  Positive for back pain.  Neurological:  Positive for tingling.  All other systems reviewed and are negative.      Objective:   Physical Exam Vitals reviewed.  Constitutional:      General: She is not in acute distress.    Appearance: She is well-developed. She is obese.  HENT:     Head: Normocephalic and atraumatic.     Right Ear: Tympanic membrane normal.     Left Ear: Tympanic membrane normal.  Eyes:     Pupils: Pupils are equal, round, and reactive to light.  Neck:     Thyroid: No thyromegaly.  Cardiovascular:     Rate and Rhythm: Normal rate and regular rhythm.     Heart sounds: Normal heart sounds. No murmur heard. Pulmonary:     Effort: Pulmonary effort is normal. No respiratory distress.     Breath sounds: Normal breath sounds. No wheezing.  Abdominal:     General: Bowel sounds are normal. There is no distension.     Palpations: Abdomen is soft.     Tenderness: There is no abdominal tenderness.  Musculoskeletal:        General: Tenderness (mild lumbar pain with flexion) present. Normal range  of motion.     Cervical back: Normal range of motion and neck supple.  Skin:    General: Skin is warm and dry.  Neurological:     Mental Status: She is alert and oriented to person, place, and time.     Cranial Nerves: No cranial nerve deficit.     Deep Tendon Reflexes: Reflexes are normal and symmetric.  Psychiatric:        Behavior: Behavior normal.        Thought Content: Thought content normal.        Judgment: Judgment  normal.        BP 127/79   Pulse 92   Temp (!) 97.5 F (36.4 C) (Temporal)   Ht 5' (1.524 m)   Wt 140 lb (63.5 kg)   LMP 08/05/1991   SpO2 97%   BMI 27.34 kg/m   Assessment & Plan:   Cheryl Lee comes in today with chief complaint of Medical Management of Chronic Issues (Patient states she givens out once being up on her feet awahile )   Diagnosis and orders addressed:  1. Gastroesophageal reflux disease without esophagitis - omeprazole (PRILOSEC) 20 MG capsule; Take 1 capsule (20 mg total) by mouth daily.  Dispense: 90 capsule; Refill: 3 - CMP14+EGFR - CBC with Differential/Platelet  2. Hyperlipidemia with target LDL less than 100 - atorvastatin (LIPITOR) 10 MG tablet; Take 1 tablet (10 mg total) by mouth daily.  Dispense: 90 tablet; Refill: 3 - CMP14+EGFR - CBC with Differential/Platelet  3. Primary hypertension - lisinopril-hydrochlorothiazide (ZESTORETIC) 20-12.5 MG tablet; Take 0.5 tablets by mouth daily.  Dispense: 45 tablet; Refill: 3 - CMP14+EGFR - CBC with Differential/Platelet  4. Lumbar nerve root impingement - CMP14+EGFR - CBC with Differential/Platelet  5. DDD (degenerative disc disease), lumbar - CMP14+EGFR - CBC with Differential/Platelet  6. Age-related osteoporosis without current pathological fracture - CMP14+EGFR - CBC with Differential/Platelet - VITAMIN D 25 Hydroxy (Vit-D Deficiency, Fractures)  7. Vitamin D deficiency - CMP14+EGFR - CBC with Differential/Platelet - VITAMIN D 25 Hydroxy (Vit-D Deficiency, Fractures)  8. Overweight (BMI 25.0-29.9) - CMP14+EGFR - CBC with Differential/Platelet  9. Other fatigue - CMP14+EGFR - CBC with Differential/Platelet - TSH   Labs pending Health Maintenance reviewed Diet and exercise encouraged  Follow up plan: 4 months   Cheryl Dun, FNP

## 2022-01-04 NOTE — Patient Instructions (Signed)
Health Maintenance After Age 76 After age 76, you are at a higher risk for certain long-term diseases and infections as well as injuries from falls. Falls are a major cause of broken bones and head injuries in people who are older than age 76. Getting regular preventive care can help to keep you healthy and well. Preventive care includes getting regular testing and making lifestyle changes as recommended by your health care provider. Talk with your health care provider about: Which screenings and tests you should have. A screening is a test that checks for a disease when you have no symptoms. A diet and exercise plan that is right for you. What should I know about screenings and tests to prevent falls? Screening and testing are the best ways to find a health problem early. Early diagnosis and treatment give you the best chance of managing medical conditions that are common after age 76. Certain conditions and lifestyle choices may make you more likely to have a fall. Your health care provider may recommend: Regular vision checks. Poor vision and conditions such as cataracts can make you more likely to have a fall. If you wear glasses, make sure to get your prescription updated if your vision changes. Medicine review. Work with your health care provider to regularly review all of the medicines you are taking, including over-the-counter medicines. Ask your health care provider about any side effects that may make you more likely to have a fall. Tell your health care provider if any medicines that you take make you feel dizzy or sleepy. Strength and balance checks. Your health care provider may recommend certain tests to check your strength and balance while standing, walking, or changing positions. Foot health exam. Foot pain and numbness, as well as not wearing proper footwear, can make you more likely to have a fall. Screenings, including: Osteoporosis screening. Osteoporosis is a condition that causes  the bones to get weaker and break more easily. Blood pressure screening. Blood pressure changes and medicines to control blood pressure can make you feel dizzy. Depression screening. You may be more likely to have a fall if you have a fear of falling, feel depressed, or feel unable to do activities that you used to do. Alcohol use screening. Using too much alcohol can affect your balance and may make you more likely to have a fall. Follow these instructions at home: Lifestyle Do not drink alcohol if: Your health care provider tells you not to drink. If you drink alcohol: Limit how much you have to: 0-1 drink a day for women. 0-2 drinks a day for men. Know how much alcohol is in your drink. In the [place] one drink equals one 12 oz bottle of beer (355 mL), one 5 oz glass of wine (148 mL), or one 1 oz glass of hard liquor (44 mL). Do not use any products that contain nicotine or tobacco. These products include cigarettes, chewing tobacco, and vaping devices, such as e-cigarettes. If you need help quitting, ask your health care provider. Activity  Follow a regular exercise program to stay fit. This will help you maintain your balance. Ask your health care provider what types of exercise are appropriate for you. If you need a cane or walker, use it as recommended by your health care provider. Wear supportive shoes that have nonskid soles. Safety  Remove any tripping hazards, such as rugs, cords, and clutter. Install safety equipment such as grab bars in bathrooms and safety rails on stairs. Keep rooms and walkways   well-lit. General instructions Talk with your health care provider about your risks for falling. Tell your health care provider if: You fall. Be sure to tell your health care provider about all falls, even ones that seem minor. You feel dizzy, tiredness (fatigue), or off-balance. Take over-the-counter and prescription medicines only as told by your health care provider. These include  supplements. Eat a healthy diet and maintain a healthy weight. A healthy diet includes low-fat dairy products, low-fat (lean) meats, and fiber from whole grains, beans, and lots of fruits and vegetables. Stay current with your vaccines. Schedule regular health, dental, and eye exams. Summary Having a healthy lifestyle and getting preventive care can help to protect your health and wellness after age 76. Screening and testing are the best way to find a health problem early and help you avoid having a fall. Early diagnosis and treatment give you the best chance for managing medical conditions that are more common for people who are older than age 76. Falls are a major cause of broken bones and head injuries in people who are older than age 76. Take precautions to prevent a fall at home. Work with your health care provider to learn what changes you can make to improve your health and wellness and to prevent falls. This information is not intended to replace advice given to you by your health care provider. Make sure you discuss any questions you have with your health care provider. Document Revised: 09/19/2020 Document Reviewed: 09/19/2020 Elsevier Patient Education  2023 [employer]  

## 2022-01-05 ENCOUNTER — Other Ambulatory Visit: Payer: Self-pay | Admitting: Family

## 2022-01-05 LAB — CMP14+EGFR
ALT: 13 IU/L (ref 0–32)
AST: 16 IU/L (ref 0–40)
Albumin/Globulin Ratio: 1.8 (ref 1.2–2.2)
Albumin: 4.2 g/dL (ref 3.8–4.8)
Alkaline Phosphatase: 85 IU/L (ref 44–121)
BUN/Creatinine Ratio: 20 (ref 12–28)
BUN: 22 mg/dL (ref 8–27)
Bilirubin Total: 0.4 mg/dL (ref 0.0–1.2)
CO2: 21 mmol/L (ref 20–29)
Calcium: 9.7 mg/dL (ref 8.7–10.3)
Chloride: 104 mmol/L (ref 96–106)
Creatinine, Ser: 1.1 mg/dL — ABNORMAL HIGH (ref 0.57–1.00)
Globulin, Total: 2.3 g/dL (ref 1.5–4.5)
Glucose: 99 mg/dL (ref 70–99)
Potassium: 4.5 mmol/L (ref 3.5–5.2)
Sodium: 142 mmol/L (ref 134–144)
Total Protein: 6.5 g/dL (ref 6.0–8.5)
eGFR: 52 mL/min/{1.73_m2} — ABNORMAL LOW (ref >59)

## 2022-01-05 LAB — CBC WITH DIFFERENTIAL/PLATELET
Basophils Absolute: 0.1 10*3/uL (ref 0.0–0.2)
Basos: 1 %
EOS (ABSOLUTE): 0.6 10*3/uL — ABNORMAL HIGH (ref 0.0–0.4)
Eos: 7 %
Hematocrit: 39.5 % (ref 34.0–46.6)
Hemoglobin: 13.2 g/dL (ref 11.1–15.9)
Immature Grans (Abs): 0 10*3/uL (ref 0.0–0.1)
Immature Granulocytes: 0 %
Lymphocytes Absolute: 2 10*3/uL (ref 0.7–3.1)
Lymphs: 21 %
MCH: 31.7 pg (ref 26.6–33.0)
MCHC: 33.4 g/dL (ref 31.5–35.7)
MCV: 95 fL (ref 79–97)
Monocytes Absolute: 0.5 10*3/uL (ref 0.1–0.9)
Monocytes: 5 %
Neutrophils Absolute: 6.1 10*3/uL (ref 1.4–7.0)
Neutrophils: 66 %
Platelets: 323 10*3/uL (ref 150–450)
RBC: 4.17 x10E6/uL (ref 3.77–5.28)
RDW: 12.3 % (ref 11.7–15.4)
WBC: 9.2 10*3/uL (ref 3.4–10.8)

## 2022-01-05 LAB — VITAMIN D 25 HYDROXY (VIT D DEFICIENCY, FRACTURES): Vit D, 25-Hydroxy: 26.7 ng/mL — ABNORMAL LOW (ref 30.0–100.0)

## 2022-01-05 LAB — TSH: TSH: 3.45 u[IU]/mL (ref 0.450–4.500)

## 2022-01-05 MED ORDER — VITAMIN D (ERGOCALCIFEROL) 1.25 MG (50000 UNIT) PO CAPS: 50000.0000 [IU] | ORAL_CAPSULE | ORAL | 3 refills | Status: DC

## 2022-01-09 ENCOUNTER — Ambulatory Visit (INDEPENDENT_AMBULATORY_CARE_PROVIDER_SITE_OTHER): Payer: Medicare Other

## 2022-01-09 DIAGNOSIS — Z0000 Encounter for general adult medical examination without abnormal findings: Secondary | ICD-10-CM | POA: Diagnosis not present

## 2022-01-09 NOTE — Progress Notes (Signed)
MEDICARE ANNUAL WELLNESS VISIT  01/09/2022  Telephone Visit Disclaimer This Medicare AWV was conducted by telephone due to national recommendations for restrictions regarding the COVID-19 Pandemic (e.g. social distancing).  I verified, using two identifiers, that I am speaking with Cheryl Lee or their authorized healthcare agent. I discussed the limitations, risks, security, and privacy concerns of performing an evaluation and management service by telephone and the potential availability of an in-person appointment in the future. The patient expressed understanding and agreed to proceed.  Location of Patient: Home Location of Provider (nurse):  [facility]  Subjective:    Cheryl Lee is a 76 y.o. female patient of Hawks, Theador Hawthorne, FNP who had a Medicare Annual Wellness Visit today via telephone. [name] is Retired and lives alone. She has one daughter, two grandchildren, and one great grandchild. She reports that she is socially active and does interact with friends/family regularly. She is minimally physically active and enjoys working crossword puzzles and spending time with her family.  Patient Care Team: Sharion Balloon, FNP as PCP - General (Family Medicine) Harlen Labs, MD as Referring Physician (Optometry) Rogene Houston, MD as Consulting Physician (Gastroenterology)     01/09/2022   11:46 AM 01/06/2021   10:25 AM 01/21/2020    6:44 AM 01/06/2020   10:43 AM 01/05/2019   10:17 AM 12/31/2017   11:08 AM 06/11/2017    9:04 AM  Advanced Directives  Does Patient Have a Medical Advance Directive? No No No No No No  No  Would patient like information on creating a medical advance directive? No - Patient declined No - Patient declined No - Patient declined No - Patient declined No - Patient declined No - Patient declined No - Patient declined     Significant value    Hospital Utilization Over the Past 12 Months: # of hospitalizations or ER visits: 0 # of surgeries:  0  Review of Systems    Patient reports that her overall health is unchanged compared to last year.  History obtained from chart review and the patient  Patient Reported Readings (BP, Pulse, CBG, Weight, etc) none  Pain Assessment   No pain     Current Medications & Allergies (verified) Allergies as of 01/09/2022       Reactions   Lycopene Itching, Rash        Medication List        Accurate as of January 09, 2022 11:50 AM. If you have any questions, ask your nurse or doctor.          alendronate 70 MG tablet Commonly known as: FOSAMAX Take 1 tablet (70 mg total) by mouth every Monday. Take with a full glass of water on an empty stomach.   aspirin 81 MG tablet Take 1 tablet (81 mg total) by mouth daily.   atorvastatin 10 MG tablet Commonly known as: LIPITOR Take 1 tablet (10 mg total) by mouth daily.   fluticasone 50 MCG/ACT nasal spray Commonly known as: FLONASE Place 2 sprays into both nostrils daily.   ibuprofen 200 MG tablet Commonly known as: ADVIL Take 200 mg by mouth daily. Take an additional 200 mg of needed for Sinus problem   lisinopril-hydrochlorothiazide 20-12.5 MG tablet Commonly known as: ZESTORETIC Take 0.5 tablets by mouth daily.   multivitamins ther. w/minerals Tabs tablet Take 1 tablet by mouth daily.   omeprazole 20 MG capsule Commonly known as: PRILOSEC Take 1 capsule (20 mg total) by mouth daily.  Systane 0.4-0.3 % Soln Generic drug: Polyethyl Glycol-Propyl Glycol Place 1 drop into both eyes daily as needed (Dry eye).   Vitamin C 500 MG Caps Take by mouth.   Vitamin D (Ergocalciferol) 1.25 MG (50000 UNIT) Caps capsule Commonly known as: DRISDOL Take 1 capsule (50,000 Units total) by mouth every 7 (seven) days.        History (reviewed): Past Medical History:  Diagnosis Date   Acid reflux    Arthritis    HNP (herniated nucleus pulposus), lumbar    Hypercholesteremia    Hypertension    Osteoporosis    Sinus  congestion    Past Surgical History:  Procedure Laterality Date   CATARACT EXTRACTION     COLONOSCOPY     COLONOSCOPY N/A 01/21/2020   Procedure: COLONOSCOPY;  Surgeon: Rogene Houston, MD;  [facility];  Service: Endoscopy;  Laterality: N/A;  730   LUMBAR LAMINECTOMY/DECOMPRESSION MICRODISCECTOMY Left 05/01/2017   Procedure: Microdiscectomy - Lumbar four-five  - left;  Surgeon: Kary Kos, MD;  Location: [facility];  Service: Neurosurgery;  Laterality: Left;   POLYPECTOMY  01/21/2020   Procedure: POLYPECTOMY;  Surgeon: Rogene Houston, MD;  Location: [facility];  Service: Endoscopy;;   Right snkle repair     Family History  Problem Relation Age of Onset   Cancer Mother    Alzheimer's disease Mother    Heart disease Mother    Early death Father    Diabetes Sister    Stroke Brother    Diabetes Sister    Cancer Sister 82       uterine   Hypertension Sister    Healthy Daughter    Social History   Socioeconomic History   Marital status: Divorced    Spouse name: Not on file   Number of children: 1   Years of education: 12   Highest education level: 12th grade  Occupational History   Occupation: retired  Tobacco Use   Smoking status: Never   Smokeless tobacco: Never  Scientific laboratory technician Use: Never used  Substance and Sexual Activity   Alcohol use: No   Drug use: No   Sexual activity: Not Currently  Other Topics Concern   Not on file  Social History Narrative   Lives alone. Daughter lives nearby   Brother lives next door.   Social Determinants of Health   Financial Resource Strain: Low Risk  (01/06/2021)   Overall Financial Resource Strain (CARDIA)    Difficulty of Paying Living Expenses: Not hard at all  Food Insecurity: No Food Insecurity (01/06/2021)   Hunger Vital Sign    Worried About Running Out of Food in the Last Year: Never true    Ran Out of Food in the Last Year: Never true  Transportation Needs: No Transportation Needs (01/06/2021)   PRAPARE -  Hydrologist (Medical): No    Lack of Transportation (Non-Medical): No  Physical Activity: Sufficiently Active (01/06/2021)   Exercise Vital Sign    Days of Exercise per Week: 6 days    Minutes of Exercise per Session: 30 min  Stress: No Stress Concern Present (01/06/2021)   [place]    Feeling of Stress : Not at all  Social Connections: Moderately Integrated (01/06/2021)   Social Connection and Isolation Panel [NHANES]    Frequency of Communication with Friends and Family: More than three times a week    Frequency of  Social Gatherings with Friends and Family: More than three times a week    Attends Religious Services: More than 4 times per year    Active Member of [employer] or Organizations: Yes    Attends Archivist Meetings: More than 4 times per year    Marital Status: Divorced    Activities of Daily Living    01/09/2022   11:46 AM  In your present state of health, do you have any difficulty performing the following activities:  Hearing? 0  Vision? 1  Difficulty concentrating or making decisions? 0  Walking or climbing stairs? 1  Dressing or bathing? 0  Doing errands, shopping? 0  Preparing Food and eating ? N  Using the Toilet? N  In the past six months, have you accidently leaked urine? Y  Do you have problems with loss of bowel control? N  Managing your Medications? N  Managing your Finances? N  Housekeeping or managing your Housekeeping? N   Patient reports that she is blind in her right eye.    Patient Education/ Literacy How often do you need to have someone help you when you read instructions, pamphlets, or other written materials from your doctor or pharmacy?: 1 - Never What is the last grade level you completed in school?: 12th grade  Exercise Current Exercise Habits: The patient does not participate in regular exercise at present, Exercise limited by: None  identified  Diet Patient reports consuming 3 meals a day and 1 snack(s) a day Patient reports that her primary diet is: Regular Patient reports that she does have regular access to food.   Depression Screen    01/09/2022   11:50 AM 01/04/2022    8:29 AM 07/07/2021    8:22 AM 01/06/2021   10:23 AM 01/04/2021    8:28 AM 07/07/2020    9:03 AM 01/06/2020   10:44 AM  PHQ 2/9 Scores  PHQ - 2 Score 0 0 0 0 0 0 0  PHQ- 9 Score   0    0     Fall Risk    01/09/2022   11:49 AM 01/04/2022    8:29 AM 07/07/2021    8:21 AM 01/06/2021   10:19 AM 01/04/2021    8:28 AM  Fall Risk   Falls in the past year? 0 0 0 1 1  Number falls in past yr:    0 0  Injury with Fall?    0 0  Risk for fall due to :    History of fall(s);Impaired balance/gait;Orthopedic patient;Impaired vision History of fall(s)  Follow up Falls evaluation completed   Education provided;Falls prevention discussed Falls evaluation completed     Objective:  TRISTA CIOCCA seemed alert and oriented and she participated appropriately during our telephone visit.  Blood Pressure Weight BMI  BP Readings from Last 3 Encounters:  01/04/22 127/79  07/07/21 110/74  01/04/21 131/76   Wt Readings from Last 3 Encounters:  01/04/22 140 lb (63.5 kg)  07/07/21 146 lb 9.6 oz (66.5 kg)  01/06/21 142 lb (64.4 kg)   BMI Readings from Last 1 Encounters:  01/04/22 27.34 kg/m    *Unable to obtain current vital signs, weight, and BMI due to telephone visit type  Hearing/Vision  Akyia did not seem to have difficulty with hearing/understanding during the telephone conversation Reports that she has had a formal eye exam by an eye care professional within the past year Reports that she has not had a formal hearing evaluation within the  past year *Unable to fully assess hearing and vision during telephone visit type  Cognitive Function:    01/09/2022   11:47 AM 01/06/2021   10:27 AM 01/06/2020   10:46 AM 01/05/2019   10:21 AM  6CIT Screen   What Year? 0 points 0 points 0 points 0 points  What month? 0 points 0 points 0 points 0 points  What time? 0 points 0 points 0 points 0 points  Count back from 20 0 points 0 points 0 points 0 points  Months in reverse 0 points 2 points 0 points 2 points  Repeat phrase 2 points 0 points 0 points 0 points  Total Score 2 points 2 points 0 points 2 points   (Normal:0-7, Significant for Dysfunction: >8)  Normal Cognitive Function Screening: Yes   Immunization & Health Maintenance Record Immunization History  Administered Date(s) Administered   Pneumococcal Conjugate-13 09/03/2014   Pneumococcal Polysaccharide-23 08/30/2012   Tdap 01/01/2012, 01/04/2022   Zoster Recombinat (Shingrix) 07/07/2021, 01/04/2022   Zoster, Live 01/01/2012    Health Maintenance  Topic Date Due   COVID-19 Vaccine (1) 01/20/2022 (Originally 09/16/1946)   INFLUENZA VACCINE  08/12/2022 (Originally 12/12/2021)   COLONOSCOPY (Pts 45-67yr Insurance coverage will need to be confirmed)  01/20/2025   TETANUS/TDAP  01/05/2032   Pneumonia Vaccine 76 Years old  Completed   DEXA SCAN  Completed   Hepatitis C Screening  Completed   Zoster Vaccines- Shingrix  Completed   HPV VACCINES  Aged Out       Assessment  This is a routine wellness examination for [employer]  Health Maintenance: Due or Overdue There are no preventive care reminders to display for this patient.  BBarnetta Hammersmithdoes not need a referral for Community Assistance: Care Management:   no Social Work:    no Prescription Assistance:  no Nutrition/Diabetes Education:  no   Plan:  Personalized Goals  Goals Addressed             This Visit's Progress    Patient Stated       01/09/2022 AWV Goal: Fall Prevention  Over the next year, patient will decrease their risk for falls by: Using assistive devices, such as a cane or walker, as needed Identifying fall risks within their home and correcting them by: Removing throw rugs Adding  handrails to stairs or ramps Removing clutter and keeping a clear pathway throughout the home Increasing light, especially at night Adding shower handles/bars Raising toilet seat Identifying potential personal risk factors for falls: Medication side effects Incontinence/urgency Vestibular dysfunction Hearing loss Musculoskeletal disorders Neurological disorders Orthostatic hypotension         Personalized Health Maintenance & Screening Recommendations  Influenza vaccine  Lung Cancer Screening Recommended: no (Low Dose CT Chest recommended if Age 76-80years, 30 pack-year currently smoking OR have quit w/in past 15 years) Hepatitis C Screening recommended: no HIV Screening recommended: no  Advanced Directives: Written information was not prepared per patient's request.  Referrals & Orders No orders of the defined types were placed in this encounter.   Follow-up Plan Follow-up with HSharion Balloon FNP as planned   I have personally reviewed and noted the following in the patient's chart:   Medical and social history Use of alcohol, tobacco or illicit drugs  Current medications and supplements Functional ability and status Nutritional status Physical activity Advanced directives List of other physicians Hospitalizations, surgeries, and ER visits in previous 12 months Vitals Screenings to include cognitive, depression, and  falls Referrals and appointments  In addition, I have reviewed and discussed with Cheryl Lee certain preventive protocols, quality metrics, and best practice recommendations. A written personalized care plan for preventive services as well as general preventive health recommendations is available and can be mailed to the patient at her request.      Burnadette Pop  01/09/2022   Patient declined after visit summary.

## 2022-01-10 ENCOUNTER — Encounter: Payer: Self-pay | Admitting: Family Medicine

## 2022-04-16 ENCOUNTER — Other Ambulatory Visit: Payer: Self-pay | Admitting: Family

## 2022-04-16 DIAGNOSIS — Z1231 Encounter for screening mammogram for malignant neoplasm of breast: Secondary | ICD-10-CM

## 2022-04-18 ENCOUNTER — Ambulatory Visit
Admission: RE | Admit: 2022-04-18 | Discharge: 2022-04-18 | Disposition: A | Discharge status: Home / Self Care | Payer: Medicare Other | Source: Ambulatory Visit | Attending: Family | Admitting: Family

## 2022-04-18 DIAGNOSIS — Z1231 Encounter for screening mammogram for malignant neoplasm of breast: Secondary | ICD-10-CM

## 2022-06-15 ENCOUNTER — Telehealth: Payer: Self-pay | Admitting: Family

## 2022-06-15 ENCOUNTER — Other Ambulatory Visit: Payer: Self-pay

## 2022-06-15 DIAGNOSIS — M810 Age-related osteoporosis without current pathological fracture: Secondary | ICD-10-CM

## 2022-06-15 DIAGNOSIS — E785 Hyperlipidemia, unspecified: Secondary | ICD-10-CM

## 2022-06-15 DIAGNOSIS — I10 Essential (primary) hypertension: Secondary | ICD-10-CM

## 2022-06-15 DIAGNOSIS — K219 Gastro-esophageal reflux disease without esophagitis: Secondary | ICD-10-CM

## 2022-06-15 MED ORDER — VITAMIN D (ERGOCALCIFEROL) 1.25 MG (50000 UNIT) PO CAPS: 50000.0000 [IU] | ORAL_CAPSULE | ORAL | 3 refills | Status: DC

## 2022-06-15 MED ORDER — ATORVASTATIN CALCIUM 10 MG PO TABS: 10.0000 mg | ORAL_TABLET | Freq: Every day | ORAL | 3 refills | Status: DC

## 2022-06-15 MED ORDER — OMEPRAZOLE 20 MG PO CPDR: 20.0000 mg | DELAYED_RELEASE_CAPSULE | Freq: Every day | ORAL | 3 refills | Status: DC

## 2022-06-15 MED ORDER — LISINOPRIL-HYDROCHLOROTHIAZIDE 20-12.5 MG PO TABS: 0.5000 | ORAL_TABLET | Freq: Every day | ORAL | 3 refills | Status: DC

## 2022-06-15 MED ORDER — FLUTICASONE PROPIONATE 50 MCG/ACT NA SUSP
2.0000 | Freq: Every day | NASAL | 3 refills | Status: DC
Start: 2022-06-15 — End: 2023-08-06

## 2022-06-15 MED ORDER — ALENDRONATE SODIUM 70 MG PO TABS: 70.0000 mg | ORAL_TABLET | ORAL | 3 refills | Status: DC

## 2022-06-15 NOTE — Telephone Encounter (Signed)
Pts pharmacy has changed because she has new insurance. Needs all of medicines [facility]. Pt says an exception will have to be done for her Vitamin D because she's already spoken with [facility] and was told by them that her insurance wont cover the Vitamin D without PCP sending exception.   Please advise and call patient once completed.

## 2022-06-24 ENCOUNTER — Ambulatory Visit
Admission: EM | Admit: 2022-06-24 | Discharge: 2022-06-24 | Disposition: A | Discharge status: Home / Self Care | Payer: Medicare Other | Attending: Family Medicine | Admitting: Family Medicine

## 2022-06-24 DIAGNOSIS — N898 Other specified noninflammatory disorders of vagina: Secondary | ICD-10-CM

## 2022-06-24 DIAGNOSIS — K5900 Constipation, unspecified: Secondary | ICD-10-CM | POA: Diagnosis not present

## 2022-06-24 DIAGNOSIS — R300 Dysuria: Secondary | ICD-10-CM

## 2022-06-24 DIAGNOSIS — S39012A Strain of muscle, fascia and tendon of lower back, initial encounter: Secondary | ICD-10-CM

## 2022-06-24 LAB — POCT URINALYSIS DIP (MANUAL ENTRY)
Bilirubin, UA: NEGATIVE
Blood, UA: NEGATIVE
Glucose, UA: NEGATIVE mg/dL
Nitrite, UA: NEGATIVE
Protein Ur, POC: NEGATIVE mg/dL
Spec Grav, UA: 1.025 (ref 1.010–1.025)
Urobilinogen, UA: 0.2 E.U./dL
pH, UA: 5 (ref 5.0–8.0)

## 2022-06-24 MED ORDER — CEPHALEXIN 500 MG PO CAPS: 500.0000 mg | ORAL_CAPSULE | Freq: Two times a day (BID) | ORAL | 0 refills | Status: DC

## 2022-06-24 MED ORDER — TIZANIDINE HCL 2 MG PO CAPS: 2.0000 mg | ORAL_CAPSULE | Freq: Two times a day (BID) | ORAL | 0 refills | Status: DC | PRN

## 2022-06-24 MED ORDER — FLUCONAZOLE 150 MG PO TABS: 150.0000 mg | ORAL_TABLET | ORAL | 0 refills | Status: DC

## 2022-06-24 NOTE — Discharge Instructions (Signed)
Your urine today had some bacteria in it so we are sending this out for a urine culture to get more specific information.  In the meantime, we will start you on the antibiotic cephalexin for a possible urinary tract infection.  Someone will call you if any changes need to be made to this based on your urine culture.  I believe you might have a yeast infection as well so we will treat you with fluconazole for this.  Regarding your hard stools and constipation, add a fiber supplement daily and MiraLAX powder as needed.  Drink plenty of fluids and eat a high-fiber diet.  Your back pain appears to be muscular in nature, in addition to the anti-inflammatory pain medication, heat, massage, stretches I have added a muscle relaxer for as needed use.  Be aware that this will cause drowsiness so be extra cautious when taking this.  Follow-up with your primary care provider as scheduled for a recheck.

## 2022-06-24 NOTE — ED Triage Notes (Signed)
Pt reports her low back pain (hurts more with ROM), burning and itching in her vagina, frequent urination, feels like she has to make a BM when she urinates . Stool has been rock hard. Sxs started a week ago.    Pt says she has some sinus issues going. She uses Flonase but no relief

## 2022-06-25 LAB — URINE CULTURE

## 2022-06-27 ENCOUNTER — Telehealth: Payer: Self-pay | Admitting: Family

## 2022-06-27 NOTE — Telephone Encounter (Signed)
  Prescription Request  06/27/2022  Is this a "Controlled Substance" medicine? no  Have you seen your PCP in the last 2 weeks? no  If YES, route message to pool  -  If NO, patient needs to be scheduled for appointment.  What is the name of the medication or equipment? Vitamin D 1.25 MG (5000 unit) caps  Have you contacted your pharmacy to request a refill? yes   Which pharmacy would you like this sent to? [facility]   Patient notified that their request is being sent to the clinical staff for review and that they should receive a response within 2 business days.

## 2022-06-27 NOTE — ED Provider Notes (Signed)
[facility]    CSN: [id number] Arrival date & time: 06/24/22  1335      History   Chief Complaint No chief complaint on file.   HPI Cheryl Lee is a 77 y.o. female.   Patient presenting today with multiple complaints.  States she is having some diffuse low back pain, urinary frequency, constipation, itching and burning vaginal irritation for the past week.  Denies fever, chills, nausea, vomiting, chest pain, shortness of breath, hematuria, dysuria.  Not taking anything for symptoms thus far.  She is also having some postnasal drip, runny nose for the past several days.  Trying Flonase with no improvement.    Past Medical History:  Diagnosis Date   Acid reflux    Arthritis    HNP (herniated nucleus pulposus), lumbar    Hypercholesteremia    Hypertension    Osteoporosis    Sinus congestion     Patient Active Problem List   Diagnosis Date Noted   Lumbar nerve root impingement 07/08/2018   DDD (degenerative disc disease), lumbar 07/08/2018   Vitamin D deficiency 03/03/2018   HNP (herniated nucleus pulposus), lumbar 05/01/2017   Overweight (BMI 25.0-29.9) 12/21/2014   GERD (gastroesophageal reflux disease)    Hyperlipidemia with target LDL less than 100    Hypertension    Osteoporosis     Past Surgical History:  Procedure Laterality Date   CATARACT EXTRACTION     COLONOSCOPY     COLONOSCOPY N/A 01/21/2020   Procedure: COLONOSCOPY;  Surgeon: Rogene Houston, MD;  [facility];  Service: Endoscopy;  Laterality: N/A;  730   LUMBAR LAMINECTOMY/DECOMPRESSION MICRODISCECTOMY Left 05/01/2017   Procedure: Microdiscectomy - Lumbar four-five  - left;  Surgeon: Kary Kos, MD;  Location: [place];  Service: Neurosurgery;  Laterality: Left;   POLYPECTOMY  01/21/2020   Procedure: POLYPECTOMY;  Surgeon: Rogene Houston, MD;  [facility];  Service: Endoscopy;;   Right snkle repair      OB History   No obstetric history on file.      Home  Medications    Prior to Admission medications   Medication Sig Start Date End Date Taking? Authorizing Provider  cephALEXin (KEFLEX) 500 MG capsule Take 1 capsule (500 mg total) by mouth 2 (two) times daily. 06/24/22  Yes Volney American, PA-C  fluconazole (DIFLUCAN) 150 MG tablet Take 1 tablet (150 mg total) by mouth once a week. 06/24/22  Yes Volney American, PA-C  tizanidine (ZANAFLEX) 2 MG capsule Take 1 capsule (2 mg total) by mouth 2 (two) times daily as needed for muscle spasms. Do not drink alcohol or drive while taking this medication.  May cause drowsiness. 06/24/22  Yes Volney American, PA-C  alendronate (FOSAMAX) 70 MG tablet Take 1 tablet (70 mg total) by mouth every Monday. Take with a full glass of water on an empty stomach. 06/18/22   Evelina Dun A, FNP  Ascorbic Acid (VITAMIN C) 500 MG CAPS Take by mouth.    [provider]  aspirin 81 MG tablet Take 1 tablet (81 mg total) by mouth daily. 01/22/20   Rehman, Mechele Dawley, MD  atorvastatin (LIPITOR) 10 MG tablet Take 1 tablet (10 mg total) by mouth daily. 06/15/22   Sharion Balloon, FNP  fluticasone (FLONASE) 50 MCG/ACT nasal spray Place 2 sprays into both nostrils daily. 06/15/22   Evelina Dun A, FNP  ibuprofen (ADVIL,MOTRIN) 200 MG tablet Take 200 mg by mouth daily. Take an additional  200 mg of needed for Sinus problem    [provider]  lisinopril-hydrochlorothiazide (ZESTORETIC) 20-12.5 MG tablet Take 0.5 tablets by mouth daily. 06/15/22   Sharion Balloon, FNP  Multiple Vitamins-Minerals (MULTIVITAMINS THER. W/MINERALS) TABS Take 1 tablet by mouth daily.    [provider]  omeprazole (PRILOSEC) 20 MG capsule Take 1 capsule (20 mg total) by mouth daily. 06/15/22   Sharion Balloon, FNP  Polyethyl Glycol-Propyl Glycol (SYSTANE) 0.4-0.3 % SOLN Place 1 drop into both eyes daily as needed (Dry eye).    [provider]  Vitamin D, Ergocalciferol, (DRISDOL) 1.25 MG (50000 UNIT) CAPS  capsule Take 1 capsule (50,000 Units total) by mouth every 7 (seven) days. 06/15/22   Sharion Balloon, FNP    Family History Family History  Problem Relation Age of Onset   Cancer Mother    Alzheimer's disease Mother    Heart disease Mother    Early death Father    Diabetes Sister    Diabetes Sister    Cancer Sister 59       uterine   Hypertension Sister    Healthy Daughter    Stroke Brother    Breast cancer Neg Hx     Social History Social History   Tobacco Use   Smoking status: Never   Smokeless tobacco: Never  Vaping Use   Vaping Use: Never used  Substance Use Topics   Alcohol use: No   Drug use: No     Allergies   Lycopene   Review of Systems Review of Systems Per HPI  Physical Exam Triage Vital Signs ED Triage Vitals  Enc Vitals Group     BP 06/24/22 1500 133/84     Pulse Rate 06/24/22 1500 (!) 103     Resp 06/24/22 1500 20     Temp 06/24/22 1500 97.7 F (36.5 C)     Temp Source 06/24/22 1500 Oral     SpO2 06/24/22 1500 94 %     Weight --      Height --      Head Circumference --      Peak Flow --      Pain Score 06/24/22 1506 6     Pain Loc --      Pain Edu? --      Excl. in [place]? --    No data found.  Updated Vital Signs BP 133/84 (BP Location: Right Arm)   Pulse (!) 103   Temp 97.7 F (36.5 C) (Oral)   Resp 20   LMP 08/05/1991   SpO2 94%   Visual Acuity Right Eye Distance:   Left Eye Distance:   Bilateral Distance:    Right Eye Near:   Left Eye Near:    Bilateral Near:     Physical Exam Vitals and nursing note reviewed.  Constitutional:      Appearance: Normal appearance. She is not ill-appearing.  HENT:     Head: Atraumatic.     Nose: Rhinorrhea present.     Mouth/Throat:     Mouth: Mucous membranes are moist.     Pharynx: No oropharyngeal exudate or posterior oropharyngeal erythema.  Eyes:     Extraocular Movements: Extraocular movements intact.     Conjunctiva/sclera: Conjunctivae normal.  Cardiovascular:     Rate  and Rhythm: Normal rate and regular rhythm.     Heart sounds: Normal heart sounds.  Pulmonary:     Effort: Pulmonary effort is normal.     Breath sounds:  Normal breath sounds. No wheezing or rales.  Abdominal:     General: Bowel sounds are normal. There is no distension.     Palpations: Abdomen is soft.     Tenderness: There is no abdominal tenderness. There is no right CVA tenderness, left CVA tenderness or guarding.  Musculoskeletal:        General: Tenderness present. No swelling, deformity or signs of injury. Normal range of motion.     Cervical back: Normal range of motion and neck supple.     Comments: Bilateral lumbar paraspinal tenderness to palpation.  No midline spinal tenderness to palpation diffusely.  Negative straight leg raise bilaterally  Skin:    General: Skin is warm and dry.  Neurological:     Mental Status: She is alert and oriented to person, place, and time.  Psychiatric:        Mood and Affect: Mood normal.        Thought Content: Thought content normal.        Judgment: Judgment normal.      UC Treatments / Results  Labs (all labs ordered are listed, but only abnormal results are displayed) Labs Reviewed  URINE CULTURE - Abnormal; Notable for the following components:      Result Value   Culture MULTIPLE SPECIES PRESENT, SUGGEST RECOLLECTION (*)    All other components within normal limits  POCT URINALYSIS DIP (MANUAL ENTRY) - Abnormal; Notable for the following components:   Ketones, POC UA small (15) (*)    Leukocytes, UA Small (1+) (*)    All other components within normal limits    EKG   Radiology No results found.  Procedures Procedures (including critical care time)  Medications Ordered in UC Medications - No data to display  Initial Impression / Assessment and Plan / UC Course  I have reviewed the triage vital signs and the nursing notes.  Pertinent labs & imaging results that were available during my care of the patient were  reviewed by me and considered in my medical decision making (see chart for details).     Vitals and exam overall reassuring today, urinalysis with evidence of a possible urinary tract infection.  Urine culture pending, treat with Keflex while awaiting results and adjust if needed.  Will also cover for a possible yeast infection with Diflucan given her itching and irritation.  Regarding her back pain, appears more muscular in nature.  Treat with low-dose of Zanaflex, heating pad, muscle rubs.  Continue Flonase, decongestant such as Coricidin HBP for the postnasal drainage.  May add antihistamine as well.  Return for any worsening symptoms.  Final Clinical Impressions(s) / UC Diagnoses   Final diagnoses:  Dysuria  Vaginal itching  Strain of lumbar region, initial encounter  Constipation, unspecified constipation type     Discharge Instructions      Your urine today had some bacteria in it so we are sending this out for a urine culture to get more specific information.  In the meantime, we will start you on the antibiotic cephalexin for a possible urinary tract infection.  Someone will call you if any changes need to be made to this based on your urine culture.  I believe you might have a yeast infection as well so we will treat you with fluconazole for this.  Regarding your hard stools and constipation, add a fiber supplement daily and MiraLAX powder as needed.  Drink plenty of fluids and eat a high-fiber diet.  Your back pain appears to  be muscular in nature, in addition to the anti-inflammatory pain medication, heat, massage, stretches I have added a muscle relaxer for as needed use.  Be aware that this will cause drowsiness so be extra cautious when taking this.  Follow-up with your primary care provider as scheduled for a recheck.    ED Prescriptions     Medication Sig Dispense Auth. Provider   cephALEXin (KEFLEX) 500 MG capsule Take 1 capsule (500 mg total) by mouth 2 (two) times daily.  10 capsule Volney American, PA-C   fluconazole (DIFLUCAN) 150 MG tablet Take 1 tablet (150 mg total) by mouth once a week. 3 tablet Volney American, PA-C   tizanidine (ZANAFLEX) 2 MG capsule Take 1 capsule (2 mg total) by mouth 2 (two) times daily as needed for muscle spasms. Do not drink alcohol or drive while taking this medication.  May cause drowsiness. 10 capsule Volney American, [place]      PDMP not reviewed this encounter.   Volney American, [place] 06/27/22 1644

## 2022-06-27 NOTE — Telephone Encounter (Signed)
Informed pt [facility] requested RF on 06/15/22, refill sent in for 1 yr.  Pt also asked about her labwork which was a UA & CS from the hospital on 06/24/22. Read to pt that nurse attempted to call then left message, pt went home on abx. Culture showed multiple species present & suggested recollect. Pt has appt w/ PCP on 07/09/22 Please advise if need recollect & if needs to be redone before appt or if will just wait till appt

## 2022-06-28 NOTE — Telephone Encounter (Signed)
Called pt no answer voicemail not working

## 2022-06-28 NOTE — Telephone Encounter (Signed)
Is she having UTI symptoms still? If so, please schedule her for video visit.

## 2022-06-29 ENCOUNTER — Telehealth (HOSPITAL_COMMUNITY): Payer: Self-pay

## 2022-06-29 NOTE — Telephone Encounter (Signed)
Appt made patient aware  °

## 2022-06-30 ENCOUNTER — Ambulatory Visit
Admission: EM | Admit: 2022-06-30 | Discharge: 2022-06-30 | Disposition: A | Discharge status: Home / Self Care | Payer: Medicare Other | Attending: Nurse Practitioner | Admitting: Nurse Practitioner

## 2022-06-30 ENCOUNTER — Ambulatory Visit (INDEPENDENT_AMBULATORY_CARE_PROVIDER_SITE_OTHER): Payer: Medicare Other

## 2022-06-30 DIAGNOSIS — S32020A Wedge compression fracture of second lumbar vertebra, initial encounter for closed fracture: Secondary | ICD-10-CM | POA: Diagnosis not present

## 2022-06-30 DIAGNOSIS — M47816 Spondylosis without myelopathy or radiculopathy, lumbar region: Secondary | ICD-10-CM | POA: Diagnosis not present

## 2022-06-30 DIAGNOSIS — M545 Low back pain, unspecified: Secondary | ICD-10-CM

## 2022-06-30 MED ORDER — LIDOCAINE 4 % EX PTCH: 1.0000 | MEDICATED_PATCH | CUTANEOUS | 0 refills | Status: DC

## 2022-06-30 MED ORDER — DEXAMETHASONE SODIUM PHOSPHATE 10 MG/ML IJ SOLN
10.0000 mg | INTRAMUSCULAR | Status: AC
  Administered 2022-06-30: 10 mg via INTRAMUSCULAR

## 2022-06-30 NOTE — ED Triage Notes (Signed)
Pt reports lower back pain x 6 days. Pain is worse with movement . Ibuprofen, cephalexin and muscle relaxer gives no relief.

## 2022-06-30 NOTE — Discharge Instructions (Addendum)
The x-ray shows a pression fracture at L2.  This is different or new since her x-ray from 2018. Discussed, it is recommended that you take Tylenol arthritis strength 650 mg tablets as directed.  You can purchase this over-the-counter. Apply lidocaine patches as prescribed to help with pain or discomfort. Based on these findings from your x-ray today, please follow-up with your neurosurgeon for further evaluation. Go to the emergency department immediately if you experience loss of bowel or bladder function, become unable to walk, or have other concerns. Follow-up as needed.

## 2022-06-30 NOTE — ED Provider Notes (Signed)
[facility]    CSN: [id number] Arrival date & time: 06/30/22  1005      History   Chief Complaint Chief Complaint  Patient presents with   Back Pain    HPI Cheryl Lee is a 77 y.o. female.   The history is provided by the patient.   Patient presents for continued low back pain.  Patient was seen in this clinic on 06/24/2022 for dysuria and treated for urinary tract infection and given information for constipation.  Patient states that she did try using MiraLAX and had some relief, but states that she has stopped using the medication.  Today she complains of pain to her low middle back.  She denies any new injury or trauma.  Patient states that she has pain when she is going from a sitting to a standing position.  She also states that she has numbness and tingling and pain that radiates into the left lower extremity.  Patient reports history of sciatica for which she had surgery.  The patient's previous visit, she was prescribed tizanidine, but she states this does not help her symptoms.  She has been taking ibuprofen for pain, states that she took 2 this morning.  She denies loss of bowel or bladder function, inability to walk, or lower extremity weakness.  She is ambulating with a cane at this time.  Past Medical History:  Diagnosis Date   Acid reflux    Arthritis    HNP (herniated nucleus pulposus), lumbar    Hypercholesteremia    Hypertension    Osteoporosis    Sinus congestion     Patient Active Problem List   Diagnosis Date Noted   Lumbar nerve root impingement 07/08/2018   DDD (degenerative disc disease), lumbar 07/08/2018   Vitamin D deficiency 03/03/2018   HNP (herniated nucleus pulposus), lumbar 05/01/2017   Overweight (BMI 25.0-29.9) 12/21/2014   GERD (gastroesophageal reflux disease)    Hyperlipidemia with target LDL less than 100    Hypertension    Osteoporosis     Past Surgical History:  Procedure Laterality Date   CATARACT EXTRACTION      COLONOSCOPY     COLONOSCOPY N/A 01/21/2020   Procedure: COLONOSCOPY;  Surgeon: Rogene Houston, MD;  [facility];  Service: Endoscopy;  Laterality: N/A;  730   LUMBAR LAMINECTOMY/DECOMPRESSION MICRODISCECTOMY Left 05/01/2017   Procedure: Microdiscectomy - Lumbar four-five  - left;  Surgeon: Kary Kos, MD;  Location: [place];  Service: Neurosurgery;  Laterality: Left;   POLYPECTOMY  01/21/2020   Procedure: POLYPECTOMY;  Surgeon: Rogene Houston, MD;  [facility];  Service: Endoscopy;;   Right snkle repair      OB History   No obstetric history on file.      Home Medications    Prior to Admission medications   Medication Sig Start Date End Date Taking? Authorizing Provider  lidocaine (HM LIDOCAINE PATCH) 4 % Place 1 patch onto the skin daily. 06/30/22  Yes Johnye Kist-Warren, Alda Lea, NP  alendronate (FOSAMAX) 70 MG tablet Take 1 tablet (70 mg total) by mouth every Monday. Take with a full glass of water on an empty stomach. 06/18/22   Evelina Dun A, FNP  Ascorbic Acid (VITAMIN C) 500 MG CAPS Take by mouth.    [provider]  aspirin 81 MG tablet Take 1 tablet (81 mg total) by mouth daily. 01/22/20   Rehman, Mechele Dawley, MD  atorvastatin (LIPITOR) 10 MG tablet Take 1 tablet (10  mg total) by mouth daily. 06/15/22   Evelina Dun A, FNP  cephALEXin (KEFLEX) 500 MG capsule Take 1 capsule (500 mg total) by mouth 2 (two) times daily. 06/24/22   Volney American, PA-C  fluconazole (DIFLUCAN) 150 MG tablet Take 1 tablet (150 mg total) by mouth once a week. 06/24/22   Volney American, PA-C  fluticasone [facility]) 50 MCG/ACT nasal spray Place 2 sprays into both nostrils daily. 06/15/22   Evelina Dun A, FNP  ibuprofen (ADVIL,MOTRIN) 200 MG tablet Take 200 mg by mouth daily. Take an additional 200 mg of needed for Sinus problem    [provider]  lisinopril-hydrochlorothiazide (ZESTORETIC) 20-12.5 MG tablet Take 0.5 tablets by mouth daily. 06/15/22   Sharion Balloon, FNP  Multiple Vitamins-Minerals (MULTIVITAMINS THER. W/MINERALS) TABS Take 1 tablet by mouth daily.    [provider]  omeprazole (PRILOSEC) 20 MG capsule Take 1 capsule (20 mg total) by mouth daily. 06/15/22   Sharion Balloon, FNP  Polyethyl Glycol-Propyl Glycol (SYSTANE) 0.4-0.3 % SOLN Place 1 drop into both eyes daily as needed (Dry eye).    [provider]  tizanidine (ZANAFLEX) 2 MG capsule Take 1 capsule (2 mg total) by mouth 2 (two) times daily as needed for muscle spasms. Do not drink alcohol or drive while taking this medication.  May cause drowsiness. 06/24/22   Volney American, PA-C  Vitamin D, Ergocalciferol, (DRISDOL) 1.25 MG (50000 UNIT) CAPS capsule Take 1 capsule (50,000 Units total) by mouth every 7 (seven) days. 06/15/22   Sharion Balloon, FNP    Family History Family History  Problem Relation Age of Onset   Cancer Mother    Alzheimer's disease Mother    Heart disease Mother    Early death Father    Diabetes Sister    Diabetes Sister    Cancer Sister 74       uterine   Hypertension Sister    Healthy Daughter    Stroke Brother    Breast cancer Neg Hx     Social History Social History   Tobacco Use   Smoking status: Never   Smokeless tobacco: Never  Vaping Use   Vaping Use: Never used  Substance Use Topics   Alcohol use: No   Drug use: No     Allergies   Lycopene   Review of Systems Review of Systems Per HPI  Physical Exam Triage Vital Signs ED Triage Vitals [06/30/22 1012]  Enc Vitals Group     BP 128/80     Pulse Rate (!) 104     Resp 18     Temp 98.2 F (36.8 C)     Temp Source Oral     SpO2 97 %     Weight      Height      Head Circumference      Peak Flow      Pain Score      Pain Loc      Pain Edu?      Excl. in [place]?    No data found.  Updated Vital Signs BP 128/80 (BP Location: Right Arm)   Pulse (!) 104   Temp 98.2 F (36.8 C) (Oral)   Resp 18   LMP 08/05/1991   SpO2 97%   Visual  Acuity Right Eye Distance:   Left Eye Distance:   Bilateral Distance:    Right Eye Near:   Left Eye Near:    Bilateral Near:  Physical Exam Vitals and nursing note reviewed.  Constitutional:      General: She is not in acute distress.    Appearance: Normal appearance.  HENT:     Head: Normocephalic.  Eyes:     Extraocular Movements: Extraocular movements intact.     Pupils: Pupils are equal, round, and reactive to light.  Cardiovascular:     Rate and Rhythm: Normal rate and regular rhythm.     Pulses: Normal pulses.     Heart sounds: Normal heart sounds.  Pulmonary:     Effort: Pulmonary effort is normal. No respiratory distress.     Breath sounds: No stridor. No wheezing, rhonchi or rales.  Abdominal:     General: Bowel sounds are normal.     Palpations: Abdomen is soft.  Musculoskeletal:     Cervical back: Normal range of motion.     Lumbar back: Tenderness (Tenderness noted to the mid lower back back between L1-L3) present. No swelling or deformity. Decreased range of motion.  Skin:    General: Skin is warm and dry.  Neurological:     General: No focal deficit present.     Mental Status: She is alert and oriented to person, place, and time.  Psychiatric:        Mood and Affect: Mood normal.        Behavior: Behavior normal.      UC Treatments / Results  Labs (all labs ordered are listed, but only abnormal results are displayed) Labs Reviewed - No data to display  EKG   Radiology DG Lumbar Spine Complete  Result Date: 06/30/2022 CLINICAL DATA:  Back pain EXAM: LUMBAR SPINE - COMPLETE 4 VIEW COMPARISON:  05/01/2017 FINDINGS: Stable compression deformity of L4 with sclerotic endplate changes [id number]. Disc space narrowing L1-2 through L4-5. New compression deformity of L2 with about 50% height loss. No focal osteolytic or osteoblastic lesions. No spondylolisthesis. Osseous structures appear osteopenic. Incidental note made of atheromatous calcification of the  aorta. IMPRESSION: Degenerative changes.  L2 compression deformity new since 2018. Electronically Signed   By: Sammie Bench M.D.   On: 06/30/2022 10:49    Procedures Procedures (including critical care time)  Medications Ordered in UC Medications  dexamethasone (DECADRON) injection 10 mg (10 mg Intramuscular Given 06/30/22 1106)    Initial Impression / Assessment and Plan / UC Course  I have reviewed the triage vital signs and the nursing notes.  Pertinent labs & imaging results that were available during my care of the patient were reviewed by me and considered in my medical decision making (see chart for details).  Patient is well-appearing, she is in no acute distress, vital signs are stable.  X-ray with compression fracture at L2, this is new since her last x-ray or imaging in 2018.  Symptoms most likely originating from this new finding based on the patient's right pain.  After findings of x-ray were discussed with the patient, patient reveals that she did fall approximately 1 year ago, but that she did not feel that she fell onto her back.  Decadron 10 mg IM injection was given in the clinic to offer patient some relief if inflammation is present.  Lidocaine patch 4% was prescribed for pain.  Advised patient to stop Profen and to begin Tylenol arthritis strength 650 mg tablets for pain.  Supportive care recommendations were discussed with the patient along with strict indication of when follow-up in the emergency may be indicated.  Patient advised to follow-up with her neurosurgeon  next week for further evaluation.  Patient is in agreement with this plan of care and verbalizes understanding.  All questions were answered.  Patient stable for discharge.  Final Clinical Impressions(s) / UC Diagnoses   Final diagnoses:  Closed compression fracture of L2 lumbar vertebra, initial encounter [facility])     Discharge Instructions      The x-ray shows a pression fracture at L2.  This is  different or new since her x-ray from 2018. Discussed, it is recommended that you take Tylenol arthritis strength 650 mg tablets as directed.  You can purchase this over-the-counter. Apply lidocaine patches as prescribed to help with pain or discomfort. Based on these findings from your x-ray today, please follow-up with your neurosurgeon for further evaluation. Go to the emergency department immediately if you experience loss of bowel or bladder function, become unable to walk, or have other concerns. Follow-up as needed.     ED Prescriptions     Medication Sig Dispense Auth. Provider   lidocaine (HM LIDOCAINE PATCH) 4 % Place 1 patch onto the skin daily. 20 patch Khaza Blansett-Warren, Alda Lea, NP      PDMP not reviewed this encounter.   Tish Men, NP 06/30/22 1115

## 2022-07-02 ENCOUNTER — Telehealth: Payer: Self-pay | Admitting: Family

## 2022-07-02 ENCOUNTER — Telehealth (INDEPENDENT_AMBULATORY_CARE_PROVIDER_SITE_OTHER): Payer: Medicare Other | Admitting: Family

## 2022-07-02 ENCOUNTER — Encounter: Payer: Self-pay | Admitting: Family

## 2022-07-02 DIAGNOSIS — M5136 Other intervertebral disc degeneration, lumbar region: Secondary | ICD-10-CM

## 2022-07-02 DIAGNOSIS — M51369 Other intervertebral disc degeneration, lumbar region without mention of lumbar back pain or lower extremity pain: Secondary | ICD-10-CM

## 2022-07-02 DIAGNOSIS — S32020D Wedge compression fracture of second lumbar vertebra, subsequent encounter for fracture with routine healing: Secondary | ICD-10-CM

## 2022-07-02 NOTE — Telephone Encounter (Signed)
Completed in video visit.

## 2022-07-02 NOTE — Telephone Encounter (Signed)
REFERRAL REQUEST Telephone Note  Have you been seen at our office for this problem? No. Pt needs a urgent referral for a possible about for 07/09/2022 (Advise that they may need an appointment with their PCP before a referral can be done)  Reason for Referral: fracture  Referral discussed with patient: no pt went to urgent care 2 twice for same fracture  Best contact number of patient for referral team: please call daughter Cheryl Lee [phone]    Has patient been seen by a specialist for this issue before: no  Patient provider preference for referral: Dr Saintclair Halsted [place] [facility] Patient location preference for referral: Dr Saintclair Halsted in [facility]   Patient notified that referrals can take up to a week or longer to process. If they haven't heard anything within a week they should call back and speak with the referral department.

## 2022-07-02 NOTE — Progress Notes (Signed)
   Virtual Visit  Note Due to COVID-19 pandemic this visit was conducted virtually. This visit type was conducted due to national recommendations for restrictions regarding the COVID-19 Pandemic (e.g. social distancing, sheltering in place) in an effort to limit this patient's exposure and mitigate transmission in our community. All issues noted in this document were discussed and addressed.  A physical exam was not performed with this format.  I connected with Cheryl Lee on 07/02/22 at 3:00 pm  by telephone and verified that I am speaking with the correct person using two identifiers. Cheryl Lee is currently located at home and no one  is currently with her during visit. The provider, Evelina Dun, FNP is located in their office at time of visit.  I discussed the limitations, risks, security and privacy concerns of performing an evaluation and management service by telephone and the availability of in person appointments. I also discussed with the patient that there may be a patient responsible charge related to this service. The patient expressed understanding and agreed to proceed.   History and Present Illness:  HPI Pt calls the office today with compression fracture of L2. She went to the Urgent Care had x-ray that showed, "Degenerative changes. L2 compression deformity new since 2018."  She reports aching pain of 7-8 out 10 when she is moving up and down. Denies any urine or bowel incontinence.   Review of Systems  All other systems reviewed and are negative.    Observations/Objective: No SOB or distress noted   Assessment and Plan: 1. DDD (degenerative disc disease), lumbar - Ambulatory referral to Neurosurgery  2. Compression fracture of L2 vertebra with routine healing, subsequent encounter - Ambulatory referral to Neurosurgery  Referral placed Red flags discussed to go to ED  Continue current  motrin   I discussed the assessment and treatment plan with the  patient. The patient was provided an opportunity to ask questions and all were answered. The patient agreed with the plan and demonstrated an understanding of the instructions.   The patient was advised to call back or seek an in-person evaluation if the symptoms worsen or if the condition fails to improve as anticipated.  The above assessment and management plan was discussed with the patient. The patient verbalized understanding of and has agreed to the management plan. Patient is aware to call the clinic if symptoms persist or worsen. Patient is aware when to return to the clinic for a follow-up visit. Patient educated on when it is appropriate to go to the emergency department.   Time call ended:  3:17 pm  I provided 17 minutes of  non face-to-face time during this encounter.    Evelina Dun, FNP

## 2022-07-05 ENCOUNTER — Emergency Department (HOSPITAL_COMMUNITY): Payer: Medicare Other

## 2022-07-05 ENCOUNTER — Encounter (HOSPITAL_COMMUNITY): Payer: Self-pay

## 2022-07-05 ENCOUNTER — Other Ambulatory Visit: Payer: Self-pay

## 2022-07-05 ENCOUNTER — Inpatient Hospital Stay (HOSPITAL_COMMUNITY)
Admission: EM | Admit: 2022-07-05 | Discharge: 2022-07-07 | DRG: 445 | Disposition: A | Discharge status: Home / Self Care | Payer: Medicare Other | Attending: Internal Medicine | Admitting: Internal Medicine

## 2022-07-05 DIAGNOSIS — K8070 Calculus of gallbladder and bile duct without cholecystitis without obstruction: Principal | ICD-10-CM | POA: Diagnosis present

## 2022-07-05 DIAGNOSIS — G8929 Other chronic pain: Secondary | ICD-10-CM | POA: Diagnosis present

## 2022-07-05 DIAGNOSIS — Z7982 Long term (current) use of aspirin: Secondary | ICD-10-CM

## 2022-07-05 DIAGNOSIS — K828 Other specified diseases of gallbladder: Secondary | ICD-10-CM | POA: Diagnosis present

## 2022-07-05 DIAGNOSIS — M5136 Other intervertebral disc degeneration, lumbar region: Secondary | ICD-10-CM | POA: Diagnosis not present

## 2022-07-05 DIAGNOSIS — M5459 Other low back pain: Secondary | ICD-10-CM | POA: Diagnosis not present

## 2022-07-05 DIAGNOSIS — I129 Hypertensive chronic kidney disease with stage 1 through stage 4 chronic kidney disease, or unspecified chronic kidney disease: Secondary | ICD-10-CM | POA: Diagnosis present

## 2022-07-05 DIAGNOSIS — T391X1A Poisoning by 4-Aminophenol derivatives, accidental (unintentional), initial encounter: Secondary | ICD-10-CM | POA: Insufficient documentation

## 2022-07-05 DIAGNOSIS — Z7983 Long term (current) use of bisphosphonates: Secondary | ICD-10-CM | POA: Diagnosis not present

## 2022-07-05 DIAGNOSIS — K861 Other chronic pancreatitis: Secondary | ICD-10-CM | POA: Diagnosis present

## 2022-07-05 DIAGNOSIS — E7800 Pure hypercholesterolemia, unspecified: Secondary | ICD-10-CM | POA: Diagnosis present

## 2022-07-05 DIAGNOSIS — Z87440 Personal history of urinary (tract) infections: Secondary | ICD-10-CM | POA: Diagnosis not present

## 2022-07-05 DIAGNOSIS — Z823 Family history of stroke: Secondary | ICD-10-CM

## 2022-07-05 DIAGNOSIS — Z888 Allergy status to other drugs, medicaments and biological substances status: Secondary | ICD-10-CM

## 2022-07-05 DIAGNOSIS — K8050 Calculus of bile duct without cholangitis or cholecystitis without obstruction: Secondary | ICD-10-CM | POA: Diagnosis not present

## 2022-07-05 DIAGNOSIS — S32000A Wedge compression fracture of unspecified lumbar vertebra, initial encounter for closed fracture: Secondary | ICD-10-CM | POA: Diagnosis present

## 2022-07-05 DIAGNOSIS — Z833 Family history of diabetes mellitus: Secondary | ICD-10-CM

## 2022-07-05 DIAGNOSIS — R1011 Right upper quadrant pain: Secondary | ICD-10-CM | POA: Diagnosis not present

## 2022-07-05 DIAGNOSIS — N179 Acute kidney failure, unspecified: Secondary | ICD-10-CM | POA: Diagnosis present

## 2022-07-05 DIAGNOSIS — I10 Essential (primary) hypertension: Secondary | ICD-10-CM | POA: Diagnosis not present

## 2022-07-05 DIAGNOSIS — K838 Other specified diseases of biliary tract: Secondary | ICD-10-CM | POA: Diagnosis not present

## 2022-07-05 DIAGNOSIS — S32020A Wedge compression fracture of second lumbar vertebra, initial encounter for closed fracture: Secondary | ICD-10-CM | POA: Diagnosis not present

## 2022-07-05 DIAGNOSIS — K5730 Diverticulosis of large intestine without perforation or abscess without bleeding: Secondary | ICD-10-CM | POA: Diagnosis not present

## 2022-07-05 DIAGNOSIS — K449 Diaphragmatic hernia without obstruction or gangrene: Secondary | ICD-10-CM | POA: Diagnosis present

## 2022-07-05 DIAGNOSIS — Z79899 Other long term (current) drug therapy: Secondary | ICD-10-CM

## 2022-07-05 DIAGNOSIS — E785 Hyperlipidemia, unspecified: Secondary | ICD-10-CM | POA: Diagnosis not present

## 2022-07-05 DIAGNOSIS — K8020 Calculus of gallbladder without cholecystitis without obstruction: Secondary | ICD-10-CM | POA: Diagnosis not present

## 2022-07-05 DIAGNOSIS — D72828 Other elevated white blood cell count: Secondary | ICD-10-CM | POA: Diagnosis present

## 2022-07-05 DIAGNOSIS — K5909 Other constipation: Secondary | ICD-10-CM | POA: Diagnosis present

## 2022-07-05 DIAGNOSIS — D72829 Elevated white blood cell count, unspecified: Secondary | ICD-10-CM | POA: Insufficient documentation

## 2022-07-05 DIAGNOSIS — N1831 Chronic kidney disease, stage 3a: Secondary | ICD-10-CM | POA: Diagnosis present

## 2022-07-05 DIAGNOSIS — M4856XG Collapsed vertebra, not elsewhere classified, lumbar region, subsequent encounter for fracture with delayed healing: Secondary | ICD-10-CM | POA: Diagnosis present

## 2022-07-05 DIAGNOSIS — Z8249 Family history of ischemic heart disease and other diseases of the circulatory system: Secondary | ICD-10-CM | POA: Diagnosis not present

## 2022-07-05 DIAGNOSIS — R932 Abnormal findings on diagnostic imaging of liver and biliary tract: Secondary | ICD-10-CM | POA: Diagnosis not present

## 2022-07-05 DIAGNOSIS — M545 Low back pain, unspecified: Secondary | ICD-10-CM

## 2022-07-05 DIAGNOSIS — K219 Gastro-esophageal reflux disease without esophagitis: Secondary | ICD-10-CM | POA: Diagnosis not present

## 2022-07-05 DIAGNOSIS — N289 Disorder of kidney and ureter, unspecified: Secondary | ICD-10-CM

## 2022-07-05 DIAGNOSIS — M1990 Unspecified osteoarthritis, unspecified site: Secondary | ICD-10-CM | POA: Diagnosis present

## 2022-07-05 LAB — CBC WITH DIFFERENTIAL/PLATELET
Abs Immature Granulocytes: 0.04 10*3/uL (ref 0.00–0.07)
Basophils Absolute: 0 10*3/uL (ref 0.0–0.1)
Basophils Relative: 0 %
Eosinophils Absolute: 0.1 10*3/uL (ref 0.0–0.5)
Eosinophils Relative: 1 %
HCT: 43.9 % (ref 36.0–46.0)
Hemoglobin: 14.6 g/dL (ref 12.0–15.0)
Immature Granulocytes: 0 %
Lymphocytes Relative: 21 %
Lymphs Abs: 2.4 10*3/uL (ref 0.7–4.0)
MCH: 32.6 pg (ref 26.0–34.0)
MCHC: 33.3 g/dL (ref 30.0–36.0)
MCV: 98 fL (ref 80.0–100.0)
Monocytes Absolute: 0.7 10*3/uL (ref 0.1–1.0)
Monocytes Relative: 6 %
Neutro Abs: 8.6 10*3/uL — ABNORMAL HIGH (ref 1.7–7.7)
Neutrophils Relative %: 72 %
Platelets: 404 10*3/uL — ABNORMAL HIGH (ref 150–400)
RBC: 4.48 MIL/uL (ref 3.87–5.11)
RDW: 13.2 % (ref 11.5–15.5)
WBC: 11.8 10*3/uL — ABNORMAL HIGH (ref 4.0–10.5)
nRBC: 0 % (ref 0.0–0.2)

## 2022-07-05 LAB — HEPATIC FUNCTION PANEL
ALT: 22 U/L (ref 0–44)
AST: 29 U/L (ref 15–41)
Albumin: 3.7 g/dL (ref 3.5–5.0)
Alkaline Phosphatase: 77 U/L (ref 38–126)
Bilirubin, Direct: 0.1 mg/dL (ref 0.0–0.2)
Indirect Bilirubin: 0.6 mg/dL (ref 0.3–0.9)
Total Bilirubin: 0.7 mg/dL (ref 0.3–1.2)
Total Protein: 7.2 g/dL (ref 6.5–8.1)

## 2022-07-05 LAB — BASIC METABOLIC PANEL
Anion gap: 12 (ref 5–15)
BUN: 31 mg/dL — ABNORMAL HIGH (ref 8–23)
CO2: 22 mmol/L (ref 22–32)
Calcium: 9.6 mg/dL (ref 8.9–10.3)
Chloride: 105 mmol/L (ref 98–111)
Creatinine, Ser: 1.37 mg/dL — ABNORMAL HIGH (ref 0.44–1.00)
GFR, Estimated: 40 mL/min — ABNORMAL LOW (ref >60)
Glucose, Bld: 110 mg/dL — ABNORMAL HIGH (ref 70–99)
Potassium: 4.3 mmol/L (ref 3.5–5.1)
Sodium: 139 mmol/L (ref 135–145)

## 2022-07-05 LAB — LIPASE, BLOOD: Lipase: 78 U/L — ABNORMAL HIGH (ref 11–51)

## 2022-07-05 MED ORDER — SODIUM CHLORIDE 0.9 % IV SOLN
1.5000 g | Freq: Once | INTRAVENOUS | Status: AC
  Administered 2022-07-05: 1.5 g via INTRAVENOUS
  Filled 2022-07-05: qty 4

## 2022-07-05 MED ORDER — SODIUM CHLORIDE 0.9 % IV SOLN: INTRAVENOUS | Status: DC

## 2022-07-05 MED ORDER — ALBUTEROL SULFATE (2.5 MG/3ML) 0.083% IN NEBU: 2.5000 mg | INHALATION_SOLUTION | Freq: Four times a day (QID) | RESPIRATORY_TRACT | Status: DC | PRN

## 2022-07-05 MED ORDER — ENOXAPARIN SODIUM 30 MG/0.3ML IJ SOSY
30.0000 mg | PREFILLED_SYRINGE | INTRAMUSCULAR | Status: DC
  Administered 2022-07-05: 30 mg via SUBCUTANEOUS
  Filled 2022-07-05: qty 0.3

## 2022-07-05 MED ORDER — PREDNISONE 10 MG PO TABS: 30.0000 mg | ORAL_TABLET | Freq: Every day | ORAL | 0 refills | Status: AC

## 2022-07-05 MED ORDER — HYDROCODONE-ACETAMINOPHEN 5-325 MG PO TABS
1.0000 | ORAL_TABLET | Freq: Four times a day (QID) | ORAL | Status: DC | PRN
  Administered 2022-07-06 – 2022-07-07 (×3): 1 via ORAL
  Filled 2022-07-05 (×3): qty 1

## 2022-07-05 MED ORDER — PANTOPRAZOLE SODIUM 40 MG PO TBEC
40.0000 mg | DELAYED_RELEASE_TABLET | Freq: Every day | ORAL | Status: DC
  Administered 2022-07-06 – 2022-07-07 (×2): 40 mg via ORAL
  Filled 2022-07-05 (×2): qty 1

## 2022-07-05 MED ORDER — ONDANSETRON HCL 4 MG PO TABS: 4.0000 mg | ORAL_TABLET | Freq: Four times a day (QID) | ORAL | Status: DC | PRN

## 2022-07-05 MED ORDER — CYCLOBENZAPRINE HCL 5 MG PO TABS: 5.0000 mg | ORAL_TABLET | Freq: Two times a day (BID) | ORAL | 0 refills | Status: AC | PRN

## 2022-07-05 MED ORDER — SODIUM CHLORIDE 0.9% FLUSH
3.0000 mL | Freq: Two times a day (BID) | INTRAVENOUS | Status: DC
  Administered 2022-07-05 – 2022-07-06 (×2): 3 mL via INTRAVENOUS

## 2022-07-05 MED ORDER — DICLOFENAC SUPPOSITORY 100 MG
100.0000 mg | Freq: Once | RECTAL | Status: DC
  Filled 2022-07-05 (×2): qty 1

## 2022-07-05 MED ORDER — GADOBUTROL 1 MMOL/ML IV SOLN
7.0000 mL | Freq: Once | INTRAVENOUS | Status: AC | PRN
  Administered 2022-07-05: 7 mL via INTRAVENOUS

## 2022-07-05 MED ORDER — ATORVASTATIN CALCIUM 10 MG PO TABS
10.0000 mg | ORAL_TABLET | Freq: Every day | ORAL | Status: DC
  Administered 2022-07-06 – 2022-07-07 (×2): 10 mg via ORAL
  Filled 2022-07-05 (×2): qty 1

## 2022-07-05 MED ORDER — ONDANSETRON HCL 4 MG/2ML IJ SOLN: 4.0000 mg | Freq: Four times a day (QID) | INTRAMUSCULAR | Status: DC | PRN

## 2022-07-05 MED ORDER — METHOCARBAMOL 500 MG PO TABS
500.0000 mg | ORAL_TABLET | Freq: Once | ORAL | Status: AC
  Administered 2022-07-05: 500 mg via ORAL
  Filled 2022-07-05: qty 1

## 2022-07-05 MED ORDER — FENTANYL CITRATE PF 50 MCG/ML IJ SOSY
50.0000 ug | PREFILLED_SYRINGE | Freq: Once | INTRAMUSCULAR | Status: AC
  Administered 2022-07-05: 50 ug via INTRAVENOUS
  Filled 2022-07-05: qty 1

## 2022-07-05 MED ORDER — FENTANYL CITRATE PF 50 MCG/ML IJ SOSY: 25.0000 ug | PREFILLED_SYRINGE | INTRAMUSCULAR | Status: DC | PRN

## 2022-07-05 NOTE — ED Triage Notes (Signed)
Pt reports right lower back pain that started 2-3 weeks ago. She went to urgent care on 06/30/22 and had an xray of her lumbar spine which showed a L2 compression deformity. She was told to come to ER if pain worsens or if she is having difficulty walking, which did start today. She denies difficulty controlling bowel or bladder. She is ambulatory with cane.

## 2022-07-05 NOTE — Consult Note (Signed)
Consultation  Referring Provider:   Dr. Melina Copa   Primary Care Physician:  Sharion Balloon, FNP Primary Gastroenterologist:  Dr. Laural Golden       Reason for Consultation: Choledocholithiasis             HPI:   Cheryl Lee is a 77 y.o. female with a past medical history as listed below including osteoporosis and arthritis, who presented to the ER today with a back pain.  We are consulted in regards to a finding of choledocholithiasis on MRCP.    Today, patient reports a 3-week history of progressive back pain, initially centrally in her back.  Apparently seen in the urgent care on 2/11 and treated for UTI.  Returned to urgent care 2/17 diagnosed with L2 fracture, though this appears chronic appearing and likely not the cause of her symptoms.  Over the past 1 to 2 days she has had an increase in right-sided back pain worse with movement.  This is not worsened by eating.  Somewhat similar to when she had sciatica in the past.  She has been taking some Ibuprofen for pain and was told by urgent care to switch to Tylenol.  Also notes that she was little bit constipated the first time she went to the urgent care regardless of her fiber supplement which she was put on years ago after colonoscopy and so she started daily MiraLAX which has been helping her to have more normal bowel movements.  No nausea, vomiting or upper GI pain.  Does describe chronic reflux for which she is on Omeprazole 20 mg every morning.  Does have some breakthrough.    Denies fever, chills or symptoms that awaken her from sleep.  ED workup: Mild leukocytosis with WBC 11.8, LFTs WNL, lipase mildly elevated 78, CT renal negative for urinary tract calculi but question choledocholithiasis, right upper quadrant ultrasound with distended gallbladder without stones, no signs of cholecystitis and a borderline dilated CBD at 8 mm, MRCP with cholelithiasis, no evidence of cholecystitis, diffuse biliary ductal dilation with few tiny calculi in  the distal CBD measuring up to 5 mm, chronic pancreatitis, moderate hiatal hernia  GI history: 01/21/2020 colonoscopy with 2 diminutive polyps of the ileocecal valve, two 4-6 mm polyps in the hepatic flexure and the ascending colon diverticulosis in the sigmoid and ascending colon; repeat recommended 5 years  Past Medical History:  Diagnosis Date   Acid reflux    Arthritis    HNP (herniated nucleus pulposus), lumbar    Hypercholesteremia    Hypertension    Osteoporosis    Sinus congestion     Past Surgical History:  Procedure Laterality Date   CATARACT EXTRACTION     COLONOSCOPY     COLONOSCOPY N/A 01/21/2020   Procedure: COLONOSCOPY;  Surgeon: Rogene Houston, MD;  Location: [facility];  Service: Endoscopy;  Laterality: N/A;  730   LUMBAR LAMINECTOMY/DECOMPRESSION MICRODISCECTOMY Left 05/01/2017   Procedure: Microdiscectomy - Lumbar four-five  - left;  Surgeon: Kary Kos, MD;  Location: [facility];  Service: Neurosurgery;  Laterality: Left;   POLYPECTOMY  01/21/2020   Procedure: POLYPECTOMY;  Surgeon: Rogene Houston, MD;  Location: [facility];  Service: Endoscopy;;   Right snkle repair      Family History  Problem Relation Age of Onset   Cancer Mother    Alzheimer's disease Mother    Heart disease Mother    Early death Father    Diabetes Sister    Diabetes  Sister    Cancer Sister 68       uterine   Hypertension Sister    Healthy Daughter    Stroke Brother    Breast cancer Neg Hx     Social History   Tobacco Use   Smoking status: Never   Smokeless tobacco: Never  Vaping Use   Vaping Use: Never used  Substance Use Topics   Alcohol use: No   Drug use: No    Prior to Admission medications   Medication Sig Start Date End Date Taking? Authorizing Provider  cyclobenzaprine (FLEXERIL) 5 MG tablet Take 1 tablet (5 mg total) by mouth 2 (two) times daily as needed for up to 10 days for muscle spasms. 07/05/22 07/15/22 Yes Marcello Fennel, PA-C  predniSONE (DELTASONE)  10 MG tablet Take 3 tablets (30 mg total) by mouth daily for 5 days. 07/05/22 07/10/22 Yes Marcello Fennel, PA-C  alendronate (FOSAMAX) 70 MG tablet Take 1 tablet (70 mg total) by mouth every Monday. Take with a full glass of water on an empty stomach. 06/18/22   Evelina Dun A, FNP  Ascorbic Acid (VITAMIN C) 500 MG CAPS Take by mouth.    [provider]  aspirin 81 MG tablet Take 1 tablet (81 mg total) by mouth daily. 01/22/20   Rehman, Mechele Dawley, MD  atorvastatin (LIPITOR) 10 MG tablet Take 1 tablet (10 mg total) by mouth daily. 06/15/22   Evelina Dun A, FNP  cephALEXin (KEFLEX) 500 MG capsule Take 1 capsule (500 mg total) by mouth 2 (two) times daily. 06/24/22   Volney American, PA-C  fluconazole (DIFLUCAN) 150 MG tablet Take 1 tablet (150 mg total) by mouth once a week. 06/24/22   Volney American, PA-C  fluticasone [facility]) 50 MCG/ACT nasal spray Place 2 sprays into both nostrils daily. 06/15/22   Evelina Dun A, FNP  ibuprofen (ADVIL,MOTRIN) 200 MG tablet Take 200 mg by mouth daily. Take an additional 200 mg of needed for Sinus problem    [provider]  lidocaine (HM LIDOCAINE PATCH) 4 % Place 1 patch onto the skin daily. 06/30/22   Leath-Warren, Alda Lea, NP  lisinopril-hydrochlorothiazide (ZESTORETIC) 20-12.5 MG tablet Take 0.5 tablets by mouth daily. 06/15/22   Sharion Balloon, FNP  Multiple Vitamins-Minerals (MULTIVITAMINS THER. W/MINERALS) TABS Take 1 tablet by mouth daily.    [provider]  omeprazole (PRILOSEC) 20 MG capsule Take 1 capsule (20 mg total) by mouth daily. 06/15/22   Sharion Balloon, FNP  Polyethyl Glycol-Propyl Glycol (SYSTANE) 0.4-0.3 % SOLN Place 1 drop into both eyes daily as needed (Dry eye).    [provider]  tizanidine (ZANAFLEX) 2 MG capsule Take 1 capsule (2 mg total) by mouth 2 (two) times daily as needed for muscle spasms. Do not drink alcohol or drive while taking this medication.  May cause drowsiness. 06/24/22    Volney American, PA-C  Vitamin D, Ergocalciferol, (DRISDOL) 1.25 MG (50000 UNIT) CAPS capsule Take 1 capsule (50,000 Units total) by mouth every 7 (seven) days. 06/15/22   Sharion Balloon, FNP    No current facility-administered medications for this encounter.   Current Outpatient Medications  Medication Sig Dispense Refill   cyclobenzaprine (FLEXERIL) 5 MG tablet Take 1 tablet (5 mg total) by mouth 2 (two) times daily as needed for up to 10 days for muscle spasms. 20 tablet 0   predniSONE (DELTASONE) 10 MG tablet Take 3 tablets (30 mg total) by mouth daily for 5  days. 15 tablet 0   alendronate (FOSAMAX) 70 MG tablet Take 1 tablet (70 mg total) by mouth every Monday. Take with a full glass of water on an empty stomach. 13 tablet 3   Ascorbic Acid (VITAMIN C) 500 MG CAPS Take by mouth.     aspirin 81 MG tablet Take 1 tablet (81 mg total) by mouth daily. 30 tablet    atorvastatin (LIPITOR) 10 MG tablet Take 1 tablet (10 mg total) by mouth daily. 90 tablet 3   cephALEXin (KEFLEX) 500 MG capsule Take 1 capsule (500 mg total) by mouth 2 (two) times daily. 10 capsule 0   fluconazole (DIFLUCAN) 150 MG tablet Take 1 tablet (150 mg total) by mouth once a week. 3 tablet 0   fluticasone (FLONASE) 50 MCG/ACT nasal spray Place 2 sprays into both nostrils daily. 48 g 3   ibuprofen (ADVIL,MOTRIN) 200 MG tablet Take 200 mg by mouth daily. Take an additional 200 mg of needed for Sinus problem     lidocaine (HM LIDOCAINE PATCH) 4 % Place 1 patch onto the skin daily. 20 patch 0   lisinopril-hydrochlorothiazide (ZESTORETIC) 20-12.5 MG tablet Take 0.5 tablets by mouth daily. 45 tablet 3   Multiple Vitamins-Minerals (MULTIVITAMINS THER. W/MINERALS) TABS Take 1 tablet by mouth daily.     omeprazole (PRILOSEC) 20 MG capsule Take 1 capsule (20 mg total) by mouth daily. 90 capsule 3   Polyethyl Glycol-Propyl Glycol (SYSTANE) 0.4-0.3 % SOLN Place 1 drop into both eyes daily as needed (Dry eye).     tizanidine  (ZANAFLEX) 2 MG capsule Take 1 capsule (2 mg total) by mouth 2 (two) times daily as needed for muscle spasms. Do not drink alcohol or drive while taking this medication.  May cause drowsiness. 10 capsule 0   Vitamin D, Ergocalciferol, (DRISDOL) 1.25 MG (50000 UNIT) CAPS capsule Take 1 capsule (50,000 Units total) by mouth every 7 (seven) days. 12 capsule 3    Allergies as of 07/05/2022 - Review Complete 07/05/2022  Allergen Reaction Noted   Lycopene Itching and Rash 04/15/2011     Review of Systems:    Constitutional: No weight loss, fever or chills Skin: No rash Cardiovascular: No chest pain Respiratory: No SOB  Gastrointestinal: See HPI and otherwise negative Genitourinary: No dysuria  Neurological: No headache, dizziness or syncope Musculoskeletal: No new muscle or joint pain Hematologic: No bleeding  Psychiatric: No history of depression or anxiety    Physical Exam:  Vital signs in last 24 hours: Temp:  [97.8 F (36.6 C)-98.6 F (37 C)] 98.6 F (37 C) (02/22 1443) Pulse Rate:  [78-101] 84 (02/22 1443) Resp:  [16-20] 16 (02/22 1443) BP: (107-126)/(75-84) 116/75 (02/22 1443) SpO2:  [95 %-98 %] 98 % (02/22 1443) Weight:  [62 kg-62.1 kg] 62 kg (02/22 1443)   General:   Pleasant elderly, Caucasian female appears to be in NAD, Well developed, Well nourished, alert and cooperative Head:  Normocephalic and atraumatic. Eyes:   PEERL, EOMI. No icterus. Conjunctiva pink. Ears:  Normal auditory acuity. Neck:  Supple Throat: Oral cavity and pharynx without inflammation, swelling or lesion.  Lungs: Respirations even and unlabored. Lungs clear to auscultation bilaterally.   No wheezes, crackles, or rhonchi.  Heart: Normal S1, S2. No MRG. Regular rate and rhythm. No peripheral edema, cyanosis or pallor.  Abdomen:  Soft, nondistended, moderate to marked ttp over central abdomen and into back,. Normal bowel sounds. No appreciable masses or hepatomegaly. Rectal:  Not performed.  Msk:   Symmetrical without  gross deformities. Peripheral pulses intact.  Extremities:  Without edema, no deformity or joint abnormality. Neurologic:  Alert and  oriented x4;  grossly normal neurologically. Skin:   Dry and intact without significant lesions or rashes. Psychiatric: Demonstrates good judgement and reason without abnormal affect or behaviors.   LAB RESULTS: Recent Labs    07/05/22 0609  WBC 11.8*  HGB 14.6  HCT 43.9  PLT 404*   BMET Recent Labs    07/05/22 0609  NA 139  K 4.3  CL 105  CO2 22  GLUCOSE 110*  BUN 31*  CREATININE 1.37*  CALCIUM 9.6      Latest Ref Rng & Units 07/05/2022    6:09 AM 01/04/2022    8:58 AM 07/07/2021    8:54 AM  Hepatic Function  Total Protein 6.5 - 8.1 g/dL 7.2  6.5  6.6   Albumin 3.5 - 5.0 g/dL 3.7  4.2  4.3   AST 15 - 41 U/L 29  16  13   $ ALT 0 - 44 U/L 22  13  15   $ Alk Phosphatase 38 - 126 U/L 77  85  80   Total Bilirubin 0.3 - 1.2 mg/dL 0.7  0.4  0.3   Bilirubin, Direct 0.0 - 0.2 mg/dL 0.1        STUDIES: MR ABDOMEN MRCP W WO CONTAST  Result Date: 07/05/2022 CLINICAL DATA:  Right-sided abdominal pain for 3 weeks. Biliary ductal dilatation on recent ultrasound. EXAM: MRI ABDOMEN WITHOUT AND WITH CONTRAST (INCLUDING MRCP) TECHNIQUE: Multiplanar multisequence MR imaging of the abdomen was performed both before and after the administration of intravenous contrast. Heavily T2-weighted images of the biliary and pancreatic ducts were obtained, and three-dimensional MRCP images were rendered by post processing. CONTRAST:  51m GADAVIST GADOBUTROL 1 MMOL/ML IV SOLN COMPARISON:  Ultrasound and noncontrast CT on 07/05/2022 FINDINGS: Lower chest: No acute findings. Hepatobiliary: No hepatic masses identified. A few gallstones are noted, largest measuring approximately 1 cm. No evidence of cholecystitis. Diffuse biliary ductal dilatation is seen with common bile duct measuring 17 mm in diameter. A few tiny calculi are seen in the distal common bile  duct, largest measuring approximately 5 mm. Pancreas: No mass or acute inflammatory changes. Diffuse irregular appearance of the pancreatic duct is noted, without evidence of pancreatic ductal dilatation or pancreas divisum. This is consistent with chronic pancreatitis. No peripancreatic fluid collections are seen. Spleen:  Within normal limits in size and appearance. Adrenals/Urinary Tract: No suspicious masses identified. No evidence of hydronephrosis. Stomach/Bowel: Moderate size hiatal hernia is seen. Probable left colonic diverticulosis, however there is no evidence of diverticulitis in this region. Vascular/Lymphatic: No pathologically enlarged lymph nodes identified. No acute vascular findings. Other:  None. Musculoskeletal:  No suspicious bone lesions identified. IMPRESSION: Cholelithiasis. No radiographic evidence of cholecystitis. Diffuse biliary ductal dilatation, with a few tiny calculi in the distal common bile duct measuring up to 5 mm. Findings of chronic pancreatitis. No radiographic signs of acute pancreatitis. Moderate hiatal hernia. Electronically Signed   By: JMarlaine HindM.D.   On: 07/05/2022 14:43   UKoreaAbdomen Limited RUQ (LIVER/GB)  Result Date: 07/05/2022 CLINICAL DATA:  Right upper quadrant pain. EXAM: ULTRASOUND ABDOMEN LIMITED RIGHT UPPER QUADRANT COMPARISON:  Same day CT abdomen/pelvis FINDINGS: Gallbladder: The gallbladder is distended but otherwise unremarkable. There are no shadowing stones. There is no gallbladder wall thickening or pericholecystic fluid. Sonographic Murphy's sign was reported negative. Common bile duct: Diameter: 8 mm, at the upper limits of normal for  size given age. Liver: No focal lesion identified. Within normal limits in parenchymal echogenicity. Portal vein is patent on color Doppler imaging with normal direction of blood flow towards the liver. Other: None. IMPRESSION: 1. Distended but otherwise unremarkable gallbladder with no shadowing stone or evidence  of acute cholecystitis. 2. Borderline dilated common bile duct measuring up to 8 mm. No obstructing lesion is seen. Correlate with laboratory values, and if there is high clinical concern for biliary obstruction, MRCP may be considered. Electronically Signed   By: Valetta Mole M.D.   On: 07/05/2022 09:01   CT Renal Stone Study  Result Date: 07/05/2022 CLINICAL DATA:  77 year old female presenting with history of right-sided flank and abdominal pain. EXAM: CT ABDOMEN AND PELVIS WITHOUT CONTRAST CT LUMBAR SPINE WITHOUT CONTRAST TECHNIQUE: Multidetector CT imaging of the abdomen and pelvis was performed following the standard protocol without IV contrast. Dedicated multiplanar reconstructions through the lumbar spine were also generated for interpretation purposes. RADIATION DOSE REDUCTION: This exam was performed according to the departmental dose-optimization program which includes automated exposure control, adjustment of the mA and/or kV according to patient size and/or use of iterative reconstruction technique. COMPARISON:  No priors. FINDINGS: Lower chest: Scattered areas of mild cylindrical bronchiectasis and what appears to be areas of mild chronic post infectious or inflammatory scarring noted in the lung bases bilaterally. Atherosclerotic calcifications in the descending thoracic aorta as well as the right coronary artery. Moderate to large hiatal hernia. Hepatobiliary: No definite suspicious cystic or solid hepatic lesions are confidently identified on today's noncontrast CT examination. Gallbladder is moderately distended. Slight heterogeneous attenuation within the lumen of the gallbladder noted, which could suggest the presence of multiple noncalcified gallstones. Gallbladder wall does not appear thickened. No pericholecystic fluid or surrounding inflammatory changes. Common bile duct appears dilated estimated to measure proximally 13 mm in the porta hepatis. Small 5 mm calcification noted in the  region of the distal common bile duct, concerning for choledocholithiasis (axial image 30 of series 1). There may be some mild intrahepatic biliary ductal dilatation, difficult to judge on today's noncontrast CT examination. Pancreas: No pancreatic mass. No pancreatic ductal dilatation. No pancreatic or peripancreatic fluid collections or inflammatory changes. Spleen: Unremarkable. Adrenals/Urinary Tract: No calcifications are identified within the collecting system of either kidney, along the course of either ureter, or within the lumen of the urinary bladder. No hydroureteronephrosis. There are several right-sided renal lesions, incompletely characterized on today's noncontrast CT examination, most concerning of which in the upper pole measures 1.2 cm in diameter and is 42 HU. Unenhanced appearance of the left kidney is unremarkable. Urinary bladder is nearly completely decompressed, but otherwise unremarkable in appearance. Stomach/Bowel: The appearance of the intra-abdominal portion of the stomach is unremarkable. Periampullary duodenal diverticulum without surrounding inflammatory changes to suggest an acute diverticulitis at this time. No pathologic dilatation of small bowel or colon. Numerous colonic diverticula are noted, without surrounding inflammatory changes to indicate an acute diverticulitis at this time. The appendix is not confidently identified and may be surgically absent. Regardless, there are no inflammatory changes noted adjacent to the cecum to suggest the presence of an acute appendicitis at this time. Vascular/Lymphatic: Atherosclerosis in the abdominal aorta and pelvic vasculature. No lymphadenopathy noted in the abdomen or pelvis. Reproductive: Uterus and ovaries are atrophic. Other: No significant volume of ascites.  No pneumoperitoneum. Musculoskeletal: Chronic appearing compression fracture of L2 superior endplate with [id number] loss of anterior vertebral body height. There is also some  compression of the inferior  endplate of L4 vertebral body most evident on the left side where there is up to 50% loss of vertebral body height. Advanced degenerative disc disease noted at L4-L5 with extensive discogenic changes. There are no aggressive appearing lytic or blastic lesions noted in the visualized portions of the skeleton. IMPRESSION: 1. No urinary tract calculi or findings of urinary tract obstruction. 2. Findings are concerning for probable obstructive choledocholithiasis. Further evaluation with abdominal MRI with and without IV gadolinium with MRCP is recommended at this time if there is any clinical or biochemical evidence concerning for biliary tract obstruction. There also appear to be multiple noncalcified gallstones filling the lumen of the gallbladder (difficult to say for certain on today's CT examination), with moderate distension of the gallbladder, but no overt imaging findings to suggest an acute cholecystitis at this time. 3. Moderate to large hiatal hernia. 4. Colonic diverticulosis without evidence of acute diverticulitis at this time. 5. Bronchiectasis and mild scarring in the lung bases bilaterally. 6. Additional incidental findings, as above. Electronically Signed   By: Vinnie Langton M.D.   On: 07/05/2022 07:13   CT L-SPINE NO CHARGE  Result Date: 07/05/2022 CLINICAL DATA:  77 year old female presenting with history of right-sided flank and abdominal pain. EXAM: CT ABDOMEN AND PELVIS WITHOUT CONTRAST CT LUMBAR SPINE WITHOUT CONTRAST TECHNIQUE: Multidetector CT imaging of the abdomen and pelvis was performed following the standard protocol without IV contrast. Dedicated multiplanar reconstructions through the lumbar spine were also generated for interpretation purposes. RADIATION DOSE REDUCTION: This exam was performed according to the departmental dose-optimization program which includes automated exposure control, adjustment of the mA and/or kV according to patient size  and/or use of iterative reconstruction technique. COMPARISON:  No priors. FINDINGS: Lower chest: Scattered areas of mild cylindrical bronchiectasis and what appears to be areas of mild chronic post infectious or inflammatory scarring noted in the lung bases bilaterally. Atherosclerotic calcifications in the descending thoracic aorta as well as the right coronary artery. Moderate to large hiatal hernia. Hepatobiliary: No definite suspicious cystic or solid hepatic lesions are confidently identified on today's noncontrast CT examination. Gallbladder is moderately distended. Slight heterogeneous attenuation within the lumen of the gallbladder noted, which could suggest the presence of multiple noncalcified gallstones. Gallbladder wall does not appear thickened. No pericholecystic fluid or surrounding inflammatory changes. Common bile duct appears dilated estimated to measure proximally 13 mm in the porta hepatis. Small 5 mm calcification noted in the region of the distal common bile duct, concerning for choledocholithiasis (axial image 30 of series 1). There may be some mild intrahepatic biliary ductal dilatation, difficult to judge on today's noncontrast CT examination. Pancreas: No pancreatic mass. No pancreatic ductal dilatation. No pancreatic or peripancreatic fluid collections or inflammatory changes. Spleen: Unremarkable. Adrenals/Urinary Tract: No calcifications are identified within the collecting system of either kidney, along the course of either ureter, or within the lumen of the urinary bladder. No hydroureteronephrosis. There are several right-sided renal lesions, incompletely characterized on today's noncontrast CT examination, most concerning of which in the upper pole measures 1.2 cm in diameter and is 42 HU. Unenhanced appearance of the left kidney is unremarkable. Urinary bladder is nearly completely decompressed, but otherwise unremarkable in appearance. Stomach/Bowel: The appearance of the  intra-abdominal portion of the stomach is unremarkable. Periampullary duodenal diverticulum without surrounding inflammatory changes to suggest an acute diverticulitis at this time. No pathologic dilatation of small bowel or colon. Numerous colonic diverticula are noted, without surrounding inflammatory changes to indicate an acute diverticulitis at this  time. The appendix is not confidently identified and may be surgically absent. Regardless, there are no inflammatory changes noted adjacent to the cecum to suggest the presence of an acute appendicitis at this time. Vascular/Lymphatic: Atherosclerosis in the abdominal aorta and pelvic vasculature. No lymphadenopathy noted in the abdomen or pelvis. Reproductive: Uterus and ovaries are atrophic. Other: No significant volume of ascites.  No pneumoperitoneum. Musculoskeletal: Chronic appearing compression fracture of L2 superior endplate with [id number] loss of anterior vertebral body height. There is also some compression of the inferior endplate of L4 vertebral body most evident on the left side where there is up to 50% loss of vertebral body height. Advanced degenerative disc disease noted at L4-L5 with extensive discogenic changes. There are no aggressive appearing lytic or blastic lesions noted in the visualized portions of the skeleton. IMPRESSION: 1. No urinary tract calculi or findings of urinary tract obstruction. 2. Findings are concerning for probable obstructive choledocholithiasis. Further evaluation with abdominal MRI with and without IV gadolinium with MRCP is recommended at this time if there is any clinical or biochemical evidence concerning for biliary tract obstruction. There also appear to be multiple noncalcified gallstones filling the lumen of the gallbladder (difficult to say for certain on today's CT examination), with moderate distension of the gallbladder, but no overt imaging findings to suggest an acute cholecystitis at this time. 3. Moderate to  large hiatal hernia. 4. Colonic diverticulosis without evidence of acute diverticulitis at this time. 5. Bronchiectasis and mild scarring in the lung bases bilaterally. 6. Additional incidental findings, as above. Electronically Signed   By: Vinnie Langton M.D.   On: 07/05/2022 07:13     Impression / Plan:   Impression: Choledocholithiasis: Back pain radiating over to the right side, worsening over the past 3 weeks, MRCP with 5 mm bile duct and tiny stones in the CBD, lipase mildly elevated 78, LFTs otherwise unremarkable, T. bili normal at 0.7, WBC mildly elevated 11.8, no nausea or vomiting or decrease in appetite, cholelithiasis via imaging Back pain: chronic L2 compression, h/o herniated lumbar discs GERD: Chronic for the patient typically maintained on Omeprazole 20 mg, but has breakthrough symptoms with certain food she eats  Plan: 1.  Discussed case with Dr. Rush Landmark who reviewed MRCP.  There are no definitive stones on imaging.  At the moment we do not have any room in the endoscopy suite for ERCP tomorrow.  She is tentatively put in [facility] in case we have schedule changes.  If we are unable to do ERCP and LFTs remain normal then consideration could be given to proceeding with cholecystectomy with intraoperative IOC first, pending surgical teams opinion. 2.  If Dr. Rush Landmark is able to do ERCP tomorrow he will likely do an EUS as well to further evaluate dilated bile duct. 3.  Okay for heart healthy diet now and n.p.o. at midnight. 4.  Agree with antiemetics, will leave analgesics to hospitalist team for back pain 5.  Continue to monitor LFTs  Thank you for your kind consultation, we will continue to follow.  Lavone Nian [facility]  07/05/2022, 3:43 PM

## 2022-07-05 NOTE — ED Notes (Signed)
ED TO INPATIENT HANDOFF REPORT  ED Nurse Name and Phone #: Su Grand [phone]  S Name/Age/Gender Cheryl Lee 77 y.o. female Room/Bed: 038C/038C  Code Status   Code Status: Full Code  Home/SNF/Other Home Patient oriented to: self, place, time, and situation Is this baseline? Yes   Triage Complete: Triage complete  Chief Complaint Tylenol overdose [T39.1X1A]  Triage Note Pt reports right lower back pain that started 2-3 weeks ago. She went to urgent care on 06/30/22 and had an xray of her lumbar spine which showed a L2 compression deformity. She was told to come to ER if pain worsens or if she is having difficulty walking, which did start today. She denies difficulty controlling bowel or bladder. She is ambulatory with cane.    Allergies Allergies  Allergen Reactions   Lycopene Itching and Rash    Level of Care/Admitting Diagnosis ED Disposition     ED Disposition  Admit   Condition  --   Comment  Hospital Area: [place] [[id number]]  Level of Care: Telemetry Medical [104]  May place patient in observation at [facility] or [place] if equivalent level of care is available:: No  Covid Evaluation: Asymptomatic - no recent exposure (last 10 days) testing not required  Diagnosis: Tylenol overdose [id number]  Admitting Physician: Norval Morton [id number]  Attending Physician: Norval Morton [id number]          B Medical/Surgery History Past Medical History:  Diagnosis Date   Acid reflux    Arthritis    HNP (herniated nucleus pulposus), lumbar    Hypercholesteremia    Hypertension    Osteoporosis    Sinus congestion    Past Surgical History:  Procedure Laterality Date   CATARACT EXTRACTION     COLONOSCOPY     COLONOSCOPY N/A 01/21/2020   Procedure: COLONOSCOPY;  Surgeon: Rogene Houston, MD;  Location[facility];  Service: Endoscopy;  Laterality: N/A;  730   LUMBAR LAMINECTOMY/DECOMPRESSION MICRODISCECTOMY Left 05/01/2017    Procedure: Microdiscectomy - Lumbar four-five  - left;  Surgeon: Kary Kos, MD;  Location: [facility];  Service: Neurosurgery;  Laterality: Left;   POLYPECTOMY  01/21/2020   Procedure: POLYPECTOMY;  Surgeon: Rogene Houston, MD;  Location: [facility];  Service: Endoscopy;;   Right snkle repair       A IV Location/Drains/Wounds Patient Lines/Drains/Airways Status     Active Line/Drains/Airways     Name Placement date Placement time Site Days   Peripheral IV 07/05/22 20 G Right Antecubital 07/05/22  0625  Antecubital  less than 1            Intake/Output Last 24 hours No intake or output data in the 24 hours ending 07/05/22 1659  Labs/Imaging Results for orders placed or performed during the hospital encounter of 07/05/22 (from the past 48 hour(s))  Basic metabolic panel     Status: Abnormal   Collection Time: 07/05/22  6:09 AM  Result Value Ref Range   Sodium 139 135 - 145 mmol/L   Potassium 4.3 3.5 - 5.1 mmol/L   Chloride 105 98 - 111 mmol/L   CO2 22 22 - 32 mmol/L   Glucose, Bld 110 (H) 70 - 99 mg/dL    Comment: Glucose reference range applies only to samples taken after fasting for at least 8 hours.   BUN 31 (H) 8 - 23 mg/dL   Creatinine, Ser 1.37 (H) 0.44 - 1.00 mg/dL   Calcium 9.6 8.9 - 10.3  mg/dL   GFR, Estimated 40 (L) >60 mL/min    Comment: (NOTE) Calculated using the CKD-EPI Creatinine Equation (2021)    Anion gap 12 5 - 15    Comment: Performed at [facility], [place] [address] [place], [place] [address]  CBC with Differential     Status: Abnormal   Collection Time: 07/05/22  6:09 AM  Result Value Ref Range   WBC 11.8 (H) 4.0 - 10.5 K/uL   RBC 4.48 3.87 - 5.11 MIL/uL   Hemoglobin 14.6 12.0 - 15.0 g/dL   HCT 43.9 36.0 - 46.0 %   MCV 98.0 80.0 - 100.0 fL   MCH 32.6 26.0 - 34.0 pg   MCHC 33.3 30.0 - 36.0 g/dL   RDW 13.2 11.5 - 15.5 %   Platelets 404 (H) 150 - 400 K/uL   nRBC 0.0 0.0 - 0.2 %   Neutrophils Relative % 72 %   Neutro Abs 8.6 (H) 1.7 - 7.7  K/uL   Lymphocytes Relative 21 %   Lymphs Abs 2.4 0.7 - 4.0 K/uL   Monocytes Relative 6 %   Monocytes Absolute 0.7 0.1 - 1.0 K/uL   Eosinophils Relative 1 %   Eosinophils Absolute 0.1 0.0 - 0.5 K/uL   Basophils Relative 0 %   Basophils Absolute 0.0 0.0 - 0.1 K/uL   Immature Granulocytes 0 %   Abs Immature Granulocytes 0.04 0.00 - 0.07 K/uL    Comment: Performed at [facility] [address], [place], [place] [address]  Hepatic function panel     Status: None   Collection Time: 07/05/22  6:09 AM  Result Value Ref Range   Total Protein 7.2 6.5 - 8.1 g/dL   Albumin 3.7 3.5 - 5.0 g/dL   AST 29 15 - 41 U/L   ALT 22 0 - 44 U/L   Alkaline Phosphatase 77 38 - 126 U/L   Total Bilirubin 0.7 0.3 - 1.2 mg/dL   Bilirubin, Direct 0.1 0.0 - 0.2 mg/dL   Indirect Bilirubin 0.6 0.3 - 0.9 mg/dL    Comment: Performed at [facility] [address] [place], [place] [address]  Lipase, blood     Status: Abnormal   Collection Time: 07/05/22  6:09 AM  Result Value Ref Range   Lipase 78 (H) 11 - 51 U/L    Comment: Performed at [facility] [address] [place],  [address]   MR ABDOMEN MRCP W WO CONTAST  Result Date: 07/05/2022 CLINICAL DATA:  Right-sided abdominal pain for 3 weeks. Biliary ductal dilatation on recent ultrasound. EXAM: MRI ABDOMEN WITHOUT AND WITH CONTRAST (INCLUDING MRCP) TECHNIQUE: Multiplanar multisequence MR imaging of the abdomen was performed both before and after the administration of intravenous contrast. Heavily T2-weighted images of the biliary and pancreatic ducts were obtained, and three-dimensional MRCP images were rendered by post processing. CONTRAST:  8m GADAVIST GADOBUTROL 1 MMOL/ML IV SOLN COMPARISON:  Ultrasound and noncontrast CT on 07/05/2022 FINDINGS: Lower chest: No acute findings. Hepatobiliary: No hepatic masses identified. A few gallstones are noted, largest measuring approximately 1 cm. No evidence of cholecystitis. Diffuse biliary  ductal dilatation is seen with common bile duct measuring 17 mm in diameter. A few tiny calculi are seen in the distal common bile duct, largest measuring approximately 5 mm. Pancreas: No mass or acute inflammatory changes. Diffuse irregular appearance of the pancreatic duct is noted, without evidence of pancreatic ductal dilatation or pancreas divisum. This is consistent with chronic  pancreatitis. No peripancreatic fluid collections are seen. Spleen:  Within normal limits in size and appearance. Adrenals/Urinary Tract: No suspicious masses identified. No evidence of hydronephrosis. Stomach/Bowel: Moderate size hiatal hernia is seen. Probable left colonic diverticulosis, however there is no evidence of diverticulitis in this region. Vascular/Lymphatic: No pathologically enlarged lymph nodes identified. No acute vascular findings. Other:  None. Musculoskeletal:  No suspicious bone lesions identified. IMPRESSION: Cholelithiasis. No radiographic evidence of cholecystitis. Diffuse biliary ductal dilatation, with a few tiny calculi in the distal common bile duct measuring up to 5 mm. Findings of chronic pancreatitis. No radiographic signs of acute pancreatitis. Moderate hiatal hernia. Electronically Signed   By: Marlaine Hind M.D.   On: 07/05/2022 14:43   US Abdomen Limited RUQ (LIVER/GB)  Result Date: 07/05/2022 CLINICAL DATA:  Right upper quadrant pain. EXAM: ULTRASOUND ABDOMEN LIMITED RIGHT UPPER QUADRANT COMPARISON:  Same day CT abdomen/pelvis FINDINGS: Gallbladder: The gallbladder is distended but otherwise unremarkable. There are no shadowing stones. There is no gallbladder wall thickening or pericholecystic fluid. Sonographic Murphy's sign was reported negative. Common bile duct: Diameter: 8 mm, at the upper limits of normal for size given age. Liver: No focal lesion identified. Within normal limits in parenchymal echogenicity. Portal vein is patent on color Doppler imaging with normal direction of blood flow  towards the liver. Other: None. IMPRESSION: 1. Distended but otherwise unremarkable gallbladder with no shadowing stone or evidence of acute cholecystitis. 2. Borderline dilated common bile duct measuring up to 8 mm. No obstructing lesion is seen. Correlate with laboratory values, and if there is high clinical concern for biliary obstruction, MRCP may be considered. Electronically Signed   By: Valetta Mole M.D.   On: 07/05/2022 09:01   CT Renal Stone Study  Result Date: 07/05/2022 CLINICAL DATA:  77 year old female presenting with history of right-sided flank and abdominal pain. EXAM: CT ABDOMEN AND PELVIS WITHOUT CONTRAST CT LUMBAR SPINE WITHOUT CONTRAST TECHNIQUE: Multidetector CT imaging of the abdomen and pelvis was performed following the standard protocol without IV contrast. Dedicated multiplanar reconstructions through the lumbar spine were also generated for interpretation purposes. RADIATION DOSE REDUCTION: This exam was performed according to the departmental dose-optimization program which includes automated exposure control, adjustment of the mA and/or kV according to patient size and/or use of iterative reconstruction technique. COMPARISON:  No priors. FINDINGS: Lower chest: Scattered areas of mild cylindrical bronchiectasis and what appears to be areas of mild chronic post infectious or inflammatory scarring noted in the lung bases bilaterally. Atherosclerotic calcifications in the descending thoracic aorta as well as the right coronary artery. Moderate to large hiatal hernia. Hepatobiliary: No definite suspicious cystic or solid hepatic lesions are confidently identified on today's noncontrast CT examination. Gallbladder is moderately distended. Slight heterogeneous attenuation within the lumen of the gallbladder noted, which could suggest the presence of multiple noncalcified gallstones. Gallbladder wall does not appear thickened. No pericholecystic fluid or surrounding inflammatory changes.  Common bile duct appears dilated estimated to measure proximally 13 mm in the porta hepatis. Small 5 mm calcification noted in the region of the distal common bile duct, concerning for choledocholithiasis (axial image 30 of series 1). There may be some mild intrahepatic biliary ductal dilatation, difficult to judge on today's noncontrast CT examination. Pancreas: No pancreatic mass. No pancreatic ductal dilatation. No pancreatic or peripancreatic fluid collections or inflammatory changes. Spleen: Unremarkable. Adrenals/Urinary Tract: No calcifications are identified within the collecting system of either kidney, along the course of either ureter, or within the lumen of the urinary  bladder. No hydroureteronephrosis. There are several right-sided renal lesions, incompletely characterized on today's noncontrast CT examination, most concerning of which in the upper pole measures 1.2 cm in diameter and is 42 HU. Unenhanced appearance of the left kidney is unremarkable. Urinary bladder is nearly completely decompressed, but otherwise unremarkable in appearance. Stomach/Bowel: The appearance of the intra-abdominal portion of the stomach is unremarkable. Periampullary duodenal diverticulum without surrounding inflammatory changes to suggest an acute diverticulitis at this time. No pathologic dilatation of small bowel or colon. Numerous colonic diverticula are noted, without surrounding inflammatory changes to indicate an acute diverticulitis at this time. The appendix is not confidently identified and may be surgically absent. Regardless, there are no inflammatory changes noted adjacent to the cecum to suggest the presence of an acute appendicitis at this time. Vascular/Lymphatic: Atherosclerosis in the abdominal aorta and pelvic vasculature. No lymphadenopathy noted in the abdomen or pelvis. Reproductive: Uterus and ovaries are atrophic. Other: No significant volume of ascites.  No pneumoperitoneum. Musculoskeletal:  Chronic appearing compression fracture of L2 superior endplate with [id number] loss of anterior vertebral body height. There is also some compression of the inferior endplate of L4 vertebral body most evident on the left side where there is up to 50% loss of vertebral body height. Advanced degenerative disc disease noted at L4-L5 with extensive discogenic changes. There are no aggressive appearing lytic or blastic lesions noted in the visualized portions of the skeleton. IMPRESSION: 1. No urinary tract calculi or findings of urinary tract obstruction. 2. Findings are concerning for probable obstructive choledocholithiasis. Further evaluation with abdominal MRI with and without IV gadolinium with MRCP is recommended at this time if there is any clinical or biochemical evidence concerning for biliary tract obstruction. There also appear to be multiple noncalcified gallstones filling the lumen of the gallbladder (difficult to say for certain on today's CT examination), with moderate distension of the gallbladder, but no overt imaging findings to suggest an acute cholecystitis at this time. 3. Moderate to large hiatal hernia. 4. Colonic diverticulosis without evidence of acute diverticulitis at this time. 5. Bronchiectasis and mild scarring in the lung bases bilaterally. 6. Additional incidental findings, as above. Electronically Signed   By: Vinnie Langton M.D.   On: 07/05/2022 07:13   CT L-SPINE NO CHARGE  Result Date: 07/05/2022 CLINICAL DATA:  77 year old female presenting with history of right-sided flank and abdominal pain. EXAM: CT ABDOMEN AND PELVIS WITHOUT CONTRAST CT LUMBAR SPINE WITHOUT CONTRAST TECHNIQUE: Multidetector CT imaging of the abdomen and pelvis was performed following the standard protocol without IV contrast. Dedicated multiplanar reconstructions through the lumbar spine were also generated for interpretation purposes. RADIATION DOSE REDUCTION: This exam was performed according to the departmental  dose-optimization program which includes automated exposure control, adjustment of the mA and/or kV according to patient size and/or use of iterative reconstruction technique. COMPARISON:  No priors. FINDINGS: Lower chest: Scattered areas of mild cylindrical bronchiectasis and what appears to be areas of mild chronic post infectious or inflammatory scarring noted in the lung bases bilaterally. Atherosclerotic calcifications in the descending thoracic aorta as well as the right coronary artery. Moderate to large hiatal hernia. Hepatobiliary: No definite suspicious cystic or solid hepatic lesions are confidently identified on today's noncontrast CT examination. Gallbladder is moderately distended. Slight heterogeneous attenuation within the lumen of the gallbladder noted, which could suggest the presence of multiple noncalcified gallstones. Gallbladder wall does not appear thickened. No pericholecystic fluid or surrounding inflammatory changes. Common bile duct appears dilated estimated to measure proximally  13 mm in the porta hepatis. Small 5 mm calcification noted in the region of the distal common bile duct, concerning for choledocholithiasis (axial image 30 of series 1). There may be some mild intrahepatic biliary ductal dilatation, difficult to judge on today's noncontrast CT examination. Pancreas: No pancreatic mass. No pancreatic ductal dilatation. No pancreatic or peripancreatic fluid collections or inflammatory changes. Spleen: Unremarkable. Adrenals/Urinary Tract: No calcifications are identified within the collecting system of either kidney, along the course of either ureter, or within the lumen of the urinary bladder. No hydroureteronephrosis. There are several right-sided renal lesions, incompletely characterized on today's noncontrast CT examination, most concerning of which in the upper pole measures 1.2 cm in diameter and is 42 HU. Unenhanced appearance of the left kidney is unremarkable. Urinary  bladder is nearly completely decompressed, but otherwise unremarkable in appearance. Stomach/Bowel: The appearance of the intra-abdominal portion of the stomach is unremarkable. Periampullary duodenal diverticulum without surrounding inflammatory changes to suggest an acute diverticulitis at this time. No pathologic dilatation of small bowel or colon. Numerous colonic diverticula are noted, without surrounding inflammatory changes to indicate an acute diverticulitis at this time. The appendix is not confidently identified and may be surgically absent. Regardless, there are no inflammatory changes noted adjacent to the cecum to suggest the presence of an acute appendicitis at this time. Vascular/Lymphatic: Atherosclerosis in the abdominal aorta and pelvic vasculature. No lymphadenopathy noted in the abdomen or pelvis. Reproductive: Uterus and ovaries are atrophic. Other: No significant volume of ascites.  No pneumoperitoneum. Musculoskeletal: Chronic appearing compression fracture of L2 superior endplate with D34-534 loss of anterior vertebral body height. There is also some compression of the inferior endplate of L4 vertebral body most evident on the left side where there is up to 50% loss of vertebral body height. Advanced degenerative disc disease noted at L4-L5 with extensive discogenic changes. There are no aggressive appearing lytic or blastic lesions noted in the visualized portions of the skeleton. IMPRESSION: 1. No urinary tract calculi or findings of urinary tract obstruction. 2. Findings are concerning for probable obstructive choledocholithiasis. Further evaluation with abdominal MRI with and without IV gadolinium with MRCP is recommended at this time if there is any clinical or biochemical evidence concerning for biliary tract obstruction. There also appear to be multiple noncalcified gallstones filling the lumen of the gallbladder (difficult to say for certain on today's CT examination), with moderate  distension of the gallbladder, but no overt imaging findings to suggest an acute cholecystitis at this time. 3. Moderate to large hiatal hernia. 4. Colonic diverticulosis without evidence of acute diverticulitis at this time. 5. Bronchiectasis and mild scarring in the lung bases bilaterally. 6. Additional incidental findings, as above. Electronically Signed   By: Vinnie Langton M.D.   On: 07/05/2022 07:13    Pending Labs Unresulted Labs (From admission, onward)     Start     Ordered   07/06/22 0500  CBC  Tomorrow morning,   R        07/05/22 1603   07/06/22 0500  Comprehensive metabolic panel  Tomorrow morning,   R        07/05/22 1603            Vitals/Pain Today's Vitals   07/05/22 0816 07/05/22 1100 07/05/22 1443 07/05/22 1445  BP: 125/75 126/84 116/75   Pulse: 98  84   Resp: 19 16 16   $ Temp: 98.6 F (37 C) 98.5 F (36.9 C) 98.6 F (37 C)   TempSrc: Oral  Oral Oral   SpO2: 98% 98% 98%   Weight: 62 kg 62 kg 62 kg   Height: 5' (1.524 m) 5' (1.524 m) 5' (1.524 m)   PainSc:  5  4  4     $ Isolation Precautions No active isolations  Medications Medications  enoxaparin (LOVENOX) injection 30 mg (has no administration in time range)  sodium chloride flush (NS) 0.9 % injection 3 mL (has no administration in time range)  0.9 %  sodium chloride infusion (has no administration in time range)  ondansetron (ZOFRAN) tablet 4 mg (has no administration in time range)    Or  ondansetron (ZOFRAN) injection 4 mg (has no administration in time range)  albuterol (PROVENTIL) (2.5 MG/3ML) 0.083% nebulizer solution 2.5 mg (has no administration in time range)  methocarbamol (ROBAXIN) tablet 500 mg (500 mg Oral Given 07/05/22 0629)  fentaNYL (SUBLIMAZE) injection 50 mcg (50 mcg Intravenous Given 07/05/22 0746)  gadobutrol (GADAVIST) 1 MMOL/ML injection 7 mL (7 mLs Intravenous Contrast Given 07/05/22 1256)    Mobility walks with device     Focused Assessments GI   R Recommendations:  See Admitting Provider Note  Report given to:   Additional Notes: daughter present with pt.

## 2022-07-05 NOTE — Discharge Instructions (Signed)
You have been seen here for back pain, I recommend taking over-the-counter pain medications like ibuprofen and/or Tylenol every 6 as needed.  Please follow dosage and on the back of bottle.  I also recommend applying heat to the area and stretching out the muscles as this will help decrease stiffness and pain.  I have given you information on exercises please follow.  Given you steroids please take as prescribed, but also give you a muscle relaxer called Flexeril this can make you drowsy do not consume alcohol or operate heavy machinery when taking this medication.  Recommend follow-up with your primary care doctor and/or neurosurgery for further evaluation  Come back to the emergency department if you develop chest pain, shortness of breath, severe abdominal pain, uncontrolled nausea, vomiting, diarrhea.

## 2022-07-05 NOTE — ED Provider Notes (Signed)
Care assumed from Deno Etienne, PA-C at shift change pending CT images and labs.  See his note for full HPI.  In short, patient is a 77 year old female who presents to the ED due to right-sided flank pain and back pain. Physical Exam  BP 107/82   Pulse 78   Temp 97.8 F (36.6 C)   Resp 18   Ht 5' (1.524 m)   Wt 62.1 kg   LMP 08/05/1991   SpO2 98%   BMI 26.76 kg/m   Physical Exam Vitals and nursing note reviewed.  Constitutional:      General: She is not in acute distress.    Appearance: She is not ill-appearing.  HENT:     Head: Normocephalic.  Eyes:     Pupils: Pupils are equal, round, and reactive to light.  Cardiovascular:     Rate and Rhythm: Normal rate and regular rhythm.     Pulses: Normal pulses.     Heart sounds: Normal heart sounds. No murmur heard.    No friction rub. No gallop.  Pulmonary:     Effort: Pulmonary effort is normal.     Breath sounds: Normal breath sounds.  Abdominal:     General: Abdomen is flat. There is no distension.     Palpations: Abdomen is soft.     Tenderness: There is abdominal tenderness. There is no guarding or rebound.  Musculoskeletal:        General: Normal range of motion.     Cervical back: Neck supple.  Skin:    General: Skin is warm and dry.  Neurological:     General: No focal deficit present.     Mental Status: She is alert.  Psychiatric:        Mood and Affect: Mood normal.        Behavior: Behavior normal.     Procedures  Procedures  ED Course / MDM   Clinical Course as of 07/05/22 1528  Thu Jul 05, 2022  0757 Creatinine(!): 1.37 [CA]  0757 Lipase(!): 78 [CA]  0926 Discussed with Jana Half with general surgery who recommends obtaining MRCP. Order placed. Reassessed patient. Patient laying in bed comfortably.  [CA]  1126 Discussed with radiology. MRCP w/ w/o contrast would show evidence of dissection if present.  [CA]    Clinical Course User Index [CA] Suzy Bouchard, PA-C   Medical Decision  Making Amount and/or Complexity of Data Reviewed Labs: ordered. Decision-making details documented in ED Course. Radiology: ordered and independent interpretation performed. Decision-making details documented in ED Course.  Risk Prescription drug management. Decision regarding hospitalization.   Care assumed pending CT images and labs.  77 year old female presents to the ED due to right-sided flank and back pain.  Upon arrival, stable vitals.  Patient no acute distress.  Tenderness throughout right side of abdomen.  CBC significant for leukocytosis at 11.8.  Normal LFTs.  BMP significant for elevated creatinine 1.37.  No major electrolyte derangements.  Lipase mildly elevated 78.  7:11 AM reviewed CT renal and CT L-spine.  Appears patient has an enlarged gallbladder with a possible stone.  Previous provider placed labs and ultrasound to assess gallbladder.  Patient given fentanyl for back pain.  Patient evaluated by Dr. Leonette Monarch prior to shift change who agrees with assessment and plan.  [place] with distended gallbladder. No evidence of acute cholecystitis. Discussed with general surgery. See notes above.  Discussed with Nevin Bloodgood with [facility] who will pass along the consult to APP at [facility]. Will likely  need ERCP.   Dr. Tamala Julian with [facility] to admit.    Karie Kirks 07/05/22 1535    Fatima Blank, MD 07/05/22 [phone]

## 2022-07-05 NOTE — H&P (Addendum)
History and Physical    Patient: Cheryl Lee [id number] DOB: 1945/10/12 DOA: 07/05/2022 DOS: the patient was seen and examined on 07/05/2022 PCP: Sharion Balloon, FNP  Patient coming from: Home  Chief Complaint:  Chief Complaint  Patient presents with   Back Pain   HPI: Cheryl Lee is a 77 y.o. female with medical history significant of hypertension, hyperlipidemia, osteoporosis, and GERD who presents with complaints of back pain over the last 3 weeks.  She reports having pain in the middle lower part of her back that is worse with any movement and seem to improve with rest.  She was seen on 2/11 at urgent care and diagnosed with a urinary tract infection and subsequently seen on 2/17 where she was compression fracture on x-ray imaging.  She reports a prior history of sciatica with pain radiating down the right leg, but states that this is not happening with her pain at this time.  She has been taking ibuprofen home from pain and had just recently been told to switch to Tylenol.  However, yesterday she started having pain on the right side of her back which seem to be a different.  Denies having any falls, fever, nausea, vomiting, or dysuria symptoms.  She had reported being constipated, but reported that it resolved after adding MiraLAX to her high-fiber diet.  In the ED patient was noted to to have stable vital signs.  Labs significant for WBC 11.8, platelets 404, BUN 31, creatinine 1.37, lipase 78.    CT scan renal study noted concern for probable obstructive choledocholithiasis.  Right upper quadrant ultrasound noted just distended gallbladder without stones and borderline dilated common bile duct at 8 mm.  General surgery had been consulted for possible need of cholecystectomy.  MRCP have been ordered which noted cholelithiasis without evidence of cholecystitis and diffuse biliary duct dilatation with a few tiny calculi in the distal common bile duct measuring up to 5 mm.  GI had been  consulted for need of ERCP.  Patient had been given fentanyl 50 mcg IV and Robaxin 533m  p.o.  Review of Systems: As mentioned in the history of present illness. All other systems reviewed and are negative. Past Medical History:  Diagnosis Date   Acid reflux    Arthritis    HNP (herniated nucleus pulposus), lumbar    Hypercholesteremia    Hypertension    Osteoporosis    Sinus congestion    Past Surgical History:  Procedure Laterality Date   CATARACT EXTRACTION     COLONOSCOPY     COLONOSCOPY N/A 01/21/2020   Procedure: COLONOSCOPY;  Surgeon: RRogene Houston MD;  [facility];  Service: Endoscopy;  Laterality: N/A;  730   LUMBAR LAMINECTOMY/DECOMPRESSION MICRODISCECTOMY Left 05/01/2017   Procedure: Microdiscectomy - Lumbar four-five  - left;  Surgeon: CKary Kos MD;  [place] [facility]  Service: Neurosurgery;  Laterality: Left;   POLYPECTOMY  01/21/2020   Procedure: POLYPECTOMY;  Surgeon: RRogene Houston MD;  Location: [facility];  Service: Endoscopy;;   Right snkle repair     Social History:  reports that she has never smoked. She has never used smokeless tobacco. She reports that she does not drink alcohol and does not use drugs.  Allergies  Allergen Reactions   Lycopene Itching and Rash    Family History  Problem Relation Age of Onset   Cancer Mother    Alzheimer's disease Mother    Heart disease Mother    Early  death Father    Diabetes Sister    Diabetes Sister    Cancer Sister 2       uterine   Hypertension Sister    Healthy Daughter    Stroke Brother    Breast cancer Neg Hx     Prior to Admission medications   Medication Sig Start Date End Date Taking? Authorizing Provider  cyclobenzaprine (FLEXERIL) 5 MG tablet Take 1 tablet (5 mg total) by mouth 2 (two) times daily as needed for up to 10 days for muscle spasms. 07/05/22 07/15/22 Yes Marcello Fennel, PA-C  predniSONE (DELTASONE) 10 MG tablet Take 3 tablets (30 mg total) by mouth daily for 5  days. 07/05/22 07/10/22 Yes Marcello Fennel, PA-C  alendronate (FOSAMAX) 70 MG tablet Take 1 tablet (70 mg total) by mouth every Monday. Take with a full glass of water on an empty stomach. 06/18/22   Evelina Dun A, FNP  Ascorbic Acid (VITAMIN C) 500 MG CAPS Take by mouth.    [provider]  aspirin 81 MG tablet Take 1 tablet (81 mg total) by mouth daily. 01/22/20   Rehman, Mechele Dawley, MD  atorvastatin (LIPITOR) 10 MG tablet Take 1 tablet (10 mg total) by mouth daily. 06/15/22   Evelina Dun A, FNP  cephALEXin (KEFLEX) 500 MG capsule Take 1 capsule (500 mg total) by mouth 2 (two) times daily. 06/24/22   Volney American, PA-C  fluconazole (DIFLUCAN) 150 MG tablet Take 1 tablet (150 mg total) by mouth once a week. 06/24/22   Volney American, PA-C  fluticasone [facility]) 50 MCG/ACT nasal spray Place 2 sprays into both nostrils daily. 06/15/22   Evelina Dun A, FNP  ibuprofen (ADVIL,MOTRIN) 200 MG tablet Take 200 mg by mouth daily. Take an additional 200 mg of needed for Sinus problem    [provider]  lidocaine (HM LIDOCAINE PATCH) 4 % Place 1 patch onto the skin daily. 06/30/22   Leath-Warren, Alda Lea, NP  lisinopril-hydrochlorothiazide (ZESTORETIC) 20-12.5 MG tablet Take 0.5 tablets by mouth daily. 06/15/22   Sharion Balloon, FNP  Multiple Vitamins-Minerals (MULTIVITAMINS THER. W/MINERALS) TABS Take 1 tablet by mouth daily.    [provider]  omeprazole (PRILOSEC) 20 MG capsule Take 1 capsule (20 mg total) by mouth daily. 06/15/22   Sharion Balloon, FNP  Polyethyl Glycol-Propyl Glycol (SYSTANE) 0.4-0.3 % SOLN Place 1 drop into both eyes daily as needed (Dry eye).    [provider]  tizanidine (ZANAFLEX) 2 MG capsule Take 1 capsule (2 mg total) by mouth 2 (two) times daily as needed for muscle spasms. Do not drink alcohol or drive while taking this medication.  May cause drowsiness. 06/24/22   Volney American, PA-C  Vitamin D, Ergocalciferol,  (DRISDOL) 1.25 MG (50000 UNIT) CAPS capsule Take 1 capsule (50,000 Units total) by mouth every 7 (seven) days. 06/15/22   Sharion Balloon, FNP    Physical Exam: Vitals:   07/05/22 0644 07/05/22 0816 07/05/22 1100 07/05/22 1443  BP: 107/82 125/75 126/84 116/75  Pulse: 78 98  84  Resp: 18 19 16 16  $ Temp:  98.6 F (37 C) 98.5 F (36.9 C) 98.6 F (37 C)  TempSrc:  Oral Oral Oral  SpO2: 98% 98% 98% 98%  Weight:  62 kg 62 kg 62 kg  Height:  5' (1.524 m) 5' (1.524 m) 5' (1.524 m)   Constitutional: Elderly female currently in no acute distress. Eyes: PERRL, lids and conjunctivae normal ENMT: Mucous membranes  are moist.  Neck: normal, supple Respiratory: clear to auscultation bilaterally, no wheezing, no crackles. Normal respiratory effort. No accessory muscle use.  Cardiovascular: Regular rate and rhythm, no appreciated. Abdomen: Soft with tenderness to palpation appreciated in the epigastric/lower mid region of the abdomen.  No guarding appreciated. Musculoskeletal: no clubbing / cyanosis. No joint deformity upper and lower extremities. Good ROM, no contractures. Normal muscle tone.  Skin: no rashes, lesions, ulcers. No induration Neurologic: CN 2-12 grossly intact. Strength 5/5 in all 4.  Psychiatric: Normal judgment and insight. Alert and oriented x 3. Normal mood.   Data Reviewed:  EKG reveals sinus tachycardia 105 bpm with low voltage.  Reviewed labs, imaging, and pertinent records as noted above in HPI  Assessment and Plan: Choledocholithiasis Acute.  Patient presented with complaints of right-sided back pain.  CT imaging noted concern for cholelithiasis with possible obstruction.  Lipase was mildly elevated at 78, but LFTs were noted to be within normal limits.  General surgery was consulted recommending MRCP which noted cholelithiasis with diffuse biliary ductal dilatation with a few tiny calculi in the distal common bile duct measuring up to 5 mm and findings of chronic  pancreatitis without acute pancreatitis.  [facility] was thereafter consulted and currently plan is for patient to undergo EUS +/- ERCP possibly tomorrow. -Admit to a telemetry bed -N.p.o. for procedure in a.m. -Hydrocodone/fentanyl as needed for pain -Normal saline IV fluids at -Appreciate GI and general surgery consultative services,  will follow-up for any further recommendations.  Leukocytosis Acute.  WBC elevated at 11.8.  Thought secondary to above. -Recheck CBC tomorrow morning  Lumbar compression fracture Chronic.  CT imaging noted chronic appearing compression fracture of L2 with 25% loss of anterior vertebral body height with compression fracture noted of L4 with 50% height loss.  Patient reported 3 weeks of progressively worsening back pain that was worse with movement. -Consider PT evaluation after procedure -Question if patient may benefit from a TLSO brace  Renal insufficiency  On admission creatinine noted to be 1.37 with BUN 31.  Baseline creatinine creatinine previously noted to be around 1.1.  This not greater than at 0.3 increased from baseline to suggest acute kidney injury, but BUN to creatinine ratio is greater than 20 to suggest prerenal cause of symptoms. -Monitor intake and output -Hold possible nephrotoxic agents -Continue normal saline IV fluids at 75 mL/h -Recheck BMP in a.m.  Essential hypertension Blood pressures currently maintained. -Held lisinopril-hydrochlorothiazide due to renal insufficiency -Resume when medically appropriate  Hyperlipidemia -Continue atorvastatin  Hiatal hernia  GERD -Continue pharmacy substitution of Protonix   DVT prophylaxis: Lovenox Advance Care Planning:   Code Status: Full Code   Consults: General surgery, GI    Severity of Illness: The appropriate patient status for this patient is OBSERVATION. Observation status is judged to be reasonable and necessary in order to provide the required intensity of service to  ensure the patient's safety. The patient's presenting symptoms, physical exam findings, and initial radiographic and laboratory data in the context of their medical condition is felt to place them at decreased risk for further clinical deterioration. Furthermore, it is anticipated that the patient will be medically stable for discharge from the hospital within 2 midnights of admission.   Author: Norval Morton, MD 07/05/2022 3:33 PM  For on call review [email or web address].

## 2022-07-05 NOTE — ED Provider Notes (Signed)
[facility] Provider Note   CSN: [id number] Arrival date & time: 07/05/22  0211     History  Chief Complaint  Patient presents with   Back Pain    Cheryl Lee is a 77 y.o. female.  HPI   Patient with medical history including hypertension, presents with complaints of right-sided flank pain.  Patient has flank pains going on for the past 3 weeks, came on gradually, has gotten worse, pain remains in her right upper flank, does not radiate, pain is worse with movement, improved with rest, she denies any saddle paresthesias urinary or bowel incontinency's, no trauma to the area.  She denies any urinary symptoms, no stomach pains no nausea no vomiting no fevers no chills.  States that she went to her urgent care and they told that she had a new compression fracture and was told to come here if symptoms or not improving.    Home Medications Prior to Admission medications   Medication Sig Start Date End Date Taking? Authorizing Provider  cyclobenzaprine (FLEXERIL) 5 MG tablet Take 1 tablet (5 mg total) by mouth 2 (two) times daily as needed for up to 10 days for muscle spasms. 07/05/22 07/15/22 Yes Marcello Fennel, PA-C  predniSONE (DELTASONE) 10 MG tablet Take 3 tablets (30 mg total) by mouth daily for 5 days. 07/05/22 07/10/22 Yes Marcello Fennel, PA-C  alendronate (FOSAMAX) 70 MG tablet Take 1 tablet (70 mg total) by mouth every Monday. Take with a full glass of water on an empty stomach. 06/18/22   Evelina Dun A, FNP  Ascorbic Acid (VITAMIN C) 500 MG CAPS Take by mouth.    [provider]  aspirin 81 MG tablet Take 1 tablet (81 mg total) by mouth daily. 01/22/20   Rehman, Mechele Dawley, MD  atorvastatin (LIPITOR) 10 MG tablet Take 1 tablet (10 mg total) by mouth daily. 06/15/22   Evelina Dun A, FNP  cephALEXin (KEFLEX) 500 MG capsule Take 1 capsule (500 mg total) by mouth 2 (two) times daily. 06/24/22   Volney American, PA-C   fluconazole (DIFLUCAN) 150 MG tablet Take 1 tablet (150 mg total) by mouth once a week. 06/24/22   Volney American, PA-C  fluticasone [facility]) 50 MCG/ACT nasal spray Place 2 sprays into both nostrils daily. 06/15/22   Evelina Dun A, FNP  ibuprofen (ADVIL,MOTRIN) 200 MG tablet Take 200 mg by mouth daily. Take an additional 200 mg of needed for Sinus problem    [provider]  lidocaine (HM LIDOCAINE PATCH) 4 % Place 1 patch onto the skin daily. 06/30/22   Leath-Warren, Alda Lea, NP  lisinopril-hydrochlorothiazide (ZESTORETIC) 20-12.5 MG tablet Take 0.5 tablets by mouth daily. 06/15/22   Sharion Balloon, FNP  Multiple Vitamins-Minerals (MULTIVITAMINS THER. W/MINERALS) TABS Take 1 tablet by mouth daily.    [provider]  omeprazole (PRILOSEC) 20 MG capsule Take 1 capsule (20 mg total) by mouth daily. 06/15/22   Sharion Balloon, FNP  Polyethyl Glycol-Propyl Glycol (SYSTANE) 0.4-0.3 % SOLN Place 1 drop into both eyes daily as needed (Dry eye).    [provider]  tizanidine (ZANAFLEX) 2 MG capsule Take 1 capsule (2 mg total) by mouth 2 (two) times daily as needed for muscle spasms. Do not drink alcohol or drive while taking this medication.  May cause drowsiness. 06/24/22   Volney American, PA-C  Vitamin D, Ergocalciferol, (DRISDOL) 1.25 MG (50000 UNIT) CAPS capsule Take 1 capsule (50,000  Units total) by mouth every 7 (seven) days. 06/15/22   Sharion Balloon, FNP      Allergies    Lycopene    Review of Systems   Review of Systems  Constitutional:  Negative for chills and fever.  Respiratory:  Negative for shortness of breath.   Cardiovascular:  Negative for chest pain.  Gastrointestinal:  Negative for abdominal pain.  Musculoskeletal:  Positive for back pain.  Neurological:  Negative for headaches.    Physical Exam Updated Vital Signs BP 107/82   Pulse 78   Temp 97.8 F (36.6 C)   Resp 18   Ht 5' (1.524 m)   Wt 62.1 kg   LMP 08/05/1991   SpO2  98%   BMI 26.76 kg/m  Physical Exam Vitals and nursing note reviewed.  Constitutional:      General: She is not in acute distress.    Appearance: She is not ill-appearing.  HENT:     Head: Normocephalic and atraumatic.     Nose: No congestion.  Eyes:     Conjunctiva/sclera: Conjunctivae normal.  Cardiovascular:     Rate and Rhythm: Normal rate and regular rhythm.     Pulses: Normal pulses.     Heart sounds: No murmur heard.    No friction rub. No gallop.  Pulmonary:     Effort: No respiratory distress.     Breath sounds: No wheezing, rhonchi or rales.  Abdominal:     Palpations: Abdomen is soft.     Tenderness: There is abdominal tenderness. There is right CVA tenderness. There is no left CVA tenderness.     Comments: Abdomen nondistended, soft, she has slight tenderness noted in right lower quadrant right flank and she has right-sided CVA tenderness, pain seems to worse on the right flank, there is no guarding rebound has or peritoneal sign.  Musculoskeletal:     Comments: Spine was palpated nontender to palpation no step-off deformities noted, she does have tenderness mainly within the musculature on her right iliac crest, she has 5 out of 5 strength neurovascular intact in the lower extremities bilaterally, she has positive straight leg raise with her right leg.  Skin:    General: Skin is warm and dry.  Neurological:     Mental Status: She is alert.  Psychiatric:        Mood and Affect: Mood normal.     ED Results / Procedures / Treatments   Labs (all labs ordered are listed, but only abnormal results are displayed) Labs Reviewed  CBC WITH DIFFERENTIAL/PLATELET - Abnormal; Notable for the following components:      Result Value   WBC 11.8 (*)    Platelets 404 (*)    Neutro Abs 8.6 (*)    All other components within normal limits  BASIC METABOLIC PANEL    EKG None  Radiology No results found.  Procedures Procedures    Medications Ordered in ED Medications   methocarbamol (ROBAXIN) tablet 500 mg (500 mg Oral Given 07/05/22 [id number])    ED Course/ Medical Decision Making/ A&P                             Medical Decision Making Amount and/or Complexity of Data Reviewed Labs: ordered. Radiology: ordered.  Risk Prescription drug management.   This patient presents to the ED for concern of back pain, this involves an extensive number of treatment options, and is a complaint that carries with it  a high risk of complications and morbidity.  The differential diagnosis includes muscular strain, spine equina, AAA, kidney stone, pyelo-, kidney stone    Additional history obtained:  Additional history obtained from daughter at bedside External records from outside source obtained and reviewed including urgent care notes   Co morbidities that complicate the patient evaluation  N/A  Social Determinants of Health:  Geriatric    Lab Tests:  I Ordered, and personally interpreted labs.  The pertinent results include: CBC, BMP pending   Imaging Studies ordered:  I ordered imaging studies including CT renal, CT no charge lumbar spine I independently visualized and interpreted imaging which showed pending I agree with the radiologist interpretation   Cardiac Monitoring:  The patient was maintained on a cardiac monitor.  I personally viewed and interpreted the cardiac monitored which showed an underlying rhythm of: N/A   Medicines ordered and prescription drug management:  I ordered medication including muscle relaxer I have reviewed the patients home medicines and have made adjustments as needed  Critical Interventions:  N/A   Reevaluation:  Presents with back pain, presentation seems consistent with likely muscular strain but she was slightly tender within her lower abdomen and there is concerns of a possible compression fracture seen on x-ray taken few days ago, we will further evaluate with basic lab workup CT  imaging.    Consultations Obtained:  N/A    Test Considered:  N/A    Rule out I have low suspicion for spinal cord abnormality as patient denies urinary incontinency, retention, difficulty with bowel movements, denies saddle paresthesias, Spine was palpated there is no step-off, crepitus or gross deformities felt, patient had 5/5 strength, full range of motion, neurovascular fully intact in the lower extremities.  Suspicion for AAA or dissection is also low at this time as she has no history of this, BP is well-controlled, pain is focalized and reproducible.  My suspicion for UTI is also low at this time she does not endorsing any urinary symptoms.    Dispostion and problem list  Due to shift change patient be handed off to [employer] pac  I suspect this is a muscular strain and will be discharged home.  Recommend follow-up on lab work CT imaging and treating accordingly.  Would recommend that she follows up with PCP and/or neurosurgery if indicated            Final Clinical Impression(s) / ED Diagnoses Final diagnoses:  Acute right-sided low back pain without sciatica    Rx / DC Orders ED Discharge Orders          Ordered    predniSONE (DELTASONE) 10 MG tablet  Daily        07/05/22 0654    cyclobenzaprine (FLEXERIL) 5 MG tablet  2 times daily PRN        07/05/22 0654              Marcello Fennel, PA-C 07/05/22 [id number]    Fatima Blank, MD 07/05/22 1751

## 2022-07-05 NOTE — Consult Note (Addendum)
Consult Note  Cheryl Lee [birth year]  [id number].    Requesting MD: Aletta Edouard, MD Chief Complaint/Reason for Consult: Choledocholithiasis  HPI:  Cheryl Lee is a 77yo female who presented to [facility] today for evaluation of back pain. Daughter at bedside helped with history taking. She reports a 3 week history of progressive back pain. Pain was initially central in her back. She was seen in urgent care on 2/11 and treated for a UTI. She returned to urgent care 2/17 and diagnosed with L2 fracture; today she was told that the L2 fracture was chronic appearing and likely not the cause of her symptoms. Over the last 1-2 days she has had increased right sided back pain. Pain is worse with movement. Denies any abdominal pain, nausea, vomiting, fever, chills. Symptoms unchanged with PO intake. States that her symptoms are somewhat similar to when she had sciatica in the past. Denies any pain radiating down her legs at this time. She had been taking ibuprofen for the pain, but was told at urgent care to switch to tylenol. ED work up today revealed mild leukocytosis with WBC 11.8, LFTs WNL, lipase mildly elevated at 78. CT renal negative for urinary tract calculi but questioned choledocholithiasis. RUQ u/s showed a distended gallbladder without stones, no signs of cholecystitis, and a borderline dilated common bile duct at 32m.  General surgery asked to see.  No h/o MI or CVA. Some SOB with prolonged ambulation but no chest pain with exertion  No prior h/o abdominal surgery Anticoagulants: none Nonsmoker Denies alcohol or illicit drug use Ambulates with a cane Lives at home alone   Family History  Problem Relation Age of Onset   Cancer Mother    Alzheimer's disease Mother    Heart disease Mother    Early death Father    Diabetes Sister    Diabetes Sister    Cancer Sister [id number]      uterine   Hypertension Sister    Healthy Daughter    Stroke Brother    Breast cancer Neg Hx      Past Medical History:  Diagnosis Date   Acid reflux    Arthritis    HNP (herniated nucleus pulposus), lumbar    Hypercholesteremia    Hypertension    Osteoporosis    Sinus congestion     Past Surgical History:  Procedure Laterality Date   CATARACT EXTRACTION     COLONOSCOPY     COLONOSCOPY N/A 01/21/2020   Procedure: COLONOSCOPY;  Surgeon: RRogene Houston MD;  [facility];  Service: Endoscopy;  Laterality: N/A;  730   LUMBAR LAMINECTOMY/DECOMPRESSION MICRODISCECTOMY Left 05/01/2017   Procedure: Microdiscectomy - Lumbar four-five  - left;  Surgeon: CKary Kos MD;  Location[facility]  Service: Neurosurgery;  Laterality: Left;   POLYPECTOMY  01/21/2020   Procedure: POLYPECTOMY;  Surgeon: RRogene Houston MD;  [facility];  Service: Endoscopy;;   Right snkle repair      Social History:  reports that she has never smoked. She has never used smokeless tobacco. She reports that she does not drink alcohol and does not use drugs.  Allergies:  Allergies  Allergen Reactions   Lycopene Itching and Rash    (Not in a hospital admission)   Blood pressure 125/75, pulse 98, temperature 98.6 F (37 C), temperature source Oral, resp. rate 19, height 5' (1.524 m), weight 62 kg, last menstrual period 08/05/1991, SpO2 98 %. Physical Exam:  General: pleasant elderly female who is laying in bed in NAD but appears very uncomfortable HEENT: head is normocephalic, atraumatic.  Sclera are noninjected.  Pupils equal and round.  Ears and nose without any masses or lesions.  Mouth is pink and moist Heart: regular, rate, and rhythm Lungs: CTAB, no wheezes, rhonchi, or rales noted.  Respiratory effort nonlabored on room air Abd: soft, ND, +BS, no masses, hernias, or organomegaly. Diffuse tenderness with pain that radiates into her back upon palpation, no rebound or guarding MS: calves soft and nontender, mild edema BLE Skin: warm and dry with no masses, lesions, or  rashes Neuro: Cranial nerves 2-12 grossly intact, sensation is normal throughout Psych: A&Ox3 with an appropriate affect.   Results for orders placed or performed during the hospital encounter of 07/05/22 (from the past 48 hour(s))  Basic metabolic panel     Status: Abnormal   Collection Time: 07/05/22  6:09 AM  Result Value Ref Range   Sodium 139 135 - 145 mmol/L   Potassium 4.3 3.5 - 5.1 mmol/L   Chloride 105 98 - 111 mmol/L   CO2 22 22 - 32 mmol/L   Glucose, Bld 110 (H) 70 - 99 mg/dL    Comment: Glucose reference range applies only to samples taken after fasting for at least 8 hours.   BUN 31 (H) 8 - 23 mg/dL   Creatinine, Ser 1.37 (H) 0.44 - 1.00 mg/dL   Calcium 9.6 8.9 - 10.3 mg/dL   GFR, Estimated 40 (L) >60 mL/min    Comment: (NOTE) Calculated using the CKD-EPI Creatinine Equation (2021)    Anion gap 12 5 - 15    Comment: Performed at [facility] [address], [place], [place] [address]  CBC with Differential     Status: Abnormal   Collection Time: 07/05/22  6:09 AM  Result Value Ref Range   WBC 11.8 (H) 4.0 - 10.5 K/uL   RBC 4.48 3.87 - 5.11 MIL/uL   Hemoglobin 14.6 12.0 - 15.0 g/dL   HCT 43.9 36.0 - 46.0 %   MCV 98.0 80.0 - 100.0 fL   MCH 32.6 26.0 - 34.0 pg   MCHC 33.3 30.0 - 36.0 g/dL   RDW 13.2 11.5 - 15.5 %   Platelets 404 (H) 150 - 400 K/uL   nRBC 0.0 0.0 - 0.2 %   Neutrophils Relative % 72 %   Neutro Abs 8.6 (H) 1.7 - 7.7 K/uL   Lymphocytes Relative 21 %   Lymphs Abs 2.4 0.7 - 4.0 K/uL   Monocytes Relative 6 %   Monocytes Absolute 0.7 0.1 - 1.0 K/uL   Eosinophils Relative 1 %   Eosinophils Absolute 0.1 0.0 - 0.5 K/uL   Basophils Relative 0 %   Basophils Absolute 0.0 0.0 - 0.1 K/uL   Immature Granulocytes 0 %   Abs Immature Granulocytes 0.04 0.00 - 0.07 K/uL    Comment: Performed at [facility] [address] [place], [place] [address]  Hepatic function panel     Status: None   Collection Time: 07/05/22  6:09 AM  Result Value Ref  Range   Total Protein 7.2 6.5 - 8.1 g/dL   Albumin 3.7 3.5 - 5.0 g/dL   AST 29 15 - 41 U/L   ALT 22 0 - 44 U/L   Alkaline Phosphatase 77 38 - 126 U/L   Total Bilirubin 0.7 0.3 - 1.2 mg/dL   Bilirubin, Direct 0.1 0.0 - 0.2 mg/dL  Indirect Bilirubin 0.6 0.3 - 0.9 mg/dL    Comment: Performed at [facility], [place] [address] [place], [place] [address]  Lipase, blood     Status: Abnormal   Collection Time: 07/05/22  6:09 AM  Result Value Ref Range   Lipase 78 (H) 11 - 51 U/L    Comment: Performed at [facility] [address], [place], [place] [address]   US Abdomen Limited RUQ (LIVER/GB)  Result Date: 07/05/2022 CLINICAL DATA:  Right upper quadrant pain. EXAM: ULTRASOUND ABDOMEN LIMITED RIGHT UPPER QUADRANT COMPARISON:  Same day CT abdomen/pelvis FINDINGS: Gallbladder: The gallbladder is distended but otherwise unremarkable. There are no shadowing stones. There is no gallbladder wall thickening or pericholecystic fluid. Sonographic Murphy's sign was reported negative. Common bile duct: Diameter: 8 mm, at the upper limits of normal for size given age. Liver: No focal lesion identified. Within normal limits in parenchymal echogenicity. Portal vein is patent on color Doppler imaging with normal direction of blood flow towards the liver. Other: None. IMPRESSION: 1. Distended but otherwise unremarkable gallbladder with no shadowing stone or evidence of acute cholecystitis. 2. Borderline dilated common bile duct measuring up to 8 mm. No obstructing lesion is seen. Correlate with laboratory values, and if there is high clinical concern for biliary obstruction, MRCP may be considered. Electronically Signed   By: Valetta Mole M.D.   On: 07/05/2022 09:01   CT Renal Stone Study  Result Date: 07/05/2022 CLINICAL DATA:  77 year old female presenting with history of right-sided flank and abdominal pain. EXAM: CT ABDOMEN AND PELVIS WITHOUT CONTRAST CT LUMBAR SPINE WITHOUT CONTRAST TECHNIQUE:  Multidetector CT imaging of the abdomen and pelvis was performed following the standard protocol without IV contrast. Dedicated multiplanar reconstructions through the lumbar spine were also generated for interpretation purposes. RADIATION DOSE REDUCTION: This exam was performed according to the departmental dose-optimization program which includes automated exposure control, adjustment of the mA and/or kV according to patient size and/or use of iterative reconstruction technique. COMPARISON:  No priors. FINDINGS: Lower chest: Scattered areas of mild cylindrical bronchiectasis and what appears to be areas of mild chronic post infectious or inflammatory scarring noted in the lung bases bilaterally. Atherosclerotic calcifications in the descending thoracic aorta as well as the right coronary artery. Moderate to large hiatal hernia. Hepatobiliary: No definite suspicious cystic or solid hepatic lesions are confidently identified on today's noncontrast CT examination. Gallbladder is moderately distended. Slight heterogeneous attenuation within the lumen of the gallbladder noted, which could suggest the presence of multiple noncalcified gallstones. Gallbladder wall does not appear thickened. No pericholecystic fluid or surrounding inflammatory changes. Common bile duct appears dilated estimated to measure proximally 13 mm in the porta hepatis. Small 5 mm calcification noted in the region of the distal common bile duct, concerning for choledocholithiasis (axial image 30 of series 1). There may be some mild intrahepatic biliary ductal dilatation, difficult to judge on today's noncontrast CT examination. Pancreas: No pancreatic mass. No pancreatic ductal dilatation. No pancreatic or peripancreatic fluid collections or inflammatory changes. Spleen: Unremarkable. Adrenals/Urinary Tract: No calcifications are identified within the collecting system of either kidney, along the course of either ureter, or within the lumen of the  urinary bladder. No hydroureteronephrosis. There are several right-sided renal lesions, incompletely characterized on today's noncontrast CT examination, most concerning of which in the upper pole measures 1.2 cm in diameter and is 42 HU. Unenhanced appearance of the left kidney is unremarkable. Urinary bladder is nearly completely decompressed, but otherwise unremarkable  in appearance. Stomach/Bowel: The appearance of the intra-abdominal portion of the stomach is unremarkable. Periampullary duodenal diverticulum without surrounding inflammatory changes to suggest an acute diverticulitis at this time. No pathologic dilatation of small bowel or colon. Numerous colonic diverticula are noted, without surrounding inflammatory changes to indicate an acute diverticulitis at this time. The appendix is not confidently identified and may be surgically absent. Regardless, there are no inflammatory changes noted adjacent to the cecum to suggest the presence of an acute appendicitis at this time. Vascular/Lymphatic: Atherosclerosis in the abdominal aorta and pelvic vasculature. No lymphadenopathy noted in the abdomen or pelvis. Reproductive: Uterus and ovaries are atrophic. Other: No significant volume of ascites.  No pneumoperitoneum. Musculoskeletal: Chronic appearing compression fracture of L2 superior endplate with [id number] loss of anterior vertebral body height. There is also some compression of the inferior endplate of L4 vertebral body most evident on the left side where there is up to 50% loss of vertebral body height. Advanced degenerative disc disease noted at L4-L5 with extensive discogenic changes. There are no aggressive appearing lytic or blastic lesions noted in the visualized portions of the skeleton. IMPRESSION: 1. No urinary tract calculi or findings of urinary tract obstruction. 2. Findings are concerning for probable obstructive choledocholithiasis. Further evaluation with abdominal MRI with and without IV  gadolinium with MRCP is recommended at this time if there is any clinical or biochemical evidence concerning for biliary tract obstruction. There also appear to be multiple noncalcified gallstones filling the lumen of the gallbladder (difficult to say for certain on today's CT examination), with moderate distension of the gallbladder, but no overt imaging findings to suggest an acute cholecystitis at this time. 3. Moderate to large hiatal hernia. 4. Colonic diverticulosis without evidence of acute diverticulitis at this time. 5. Bronchiectasis and mild scarring in the lung bases bilaterally. 6. Additional incidental findings, as above. Electronically Signed   By: Vinnie Langton M.D.   On: 07/05/2022 07:13   CT L-SPINE NO CHARGE  Result Date: 07/05/2022 CLINICAL DATA:  77 year old female presenting with history of right-sided flank and abdominal pain. EXAM: CT ABDOMEN AND PELVIS WITHOUT CONTRAST CT LUMBAR SPINE WITHOUT CONTRAST TECHNIQUE: Multidetector CT imaging of the abdomen and pelvis was performed following the standard protocol without IV contrast. Dedicated multiplanar reconstructions through the lumbar spine were also generated for interpretation purposes. RADIATION DOSE REDUCTION: This exam was performed according to the departmental dose-optimization program which includes automated exposure control, adjustment of the mA and/or kV according to patient size and/or use of iterative reconstruction technique. COMPARISON:  No priors. FINDINGS: Lower chest: Scattered areas of mild cylindrical bronchiectasis and what appears to be areas of mild chronic post infectious or inflammatory scarring noted in the lung bases bilaterally. Atherosclerotic calcifications in the descending thoracic aorta as well as the right coronary artery. Moderate to large hiatal hernia. Hepatobiliary: No definite suspicious cystic or solid hepatic lesions are confidently identified on today's noncontrast CT examination. Gallbladder  is moderately distended. Slight heterogeneous attenuation within the lumen of the gallbladder noted, which could suggest the presence of multiple noncalcified gallstones. Gallbladder wall does not appear thickened. No pericholecystic fluid or surrounding inflammatory changes. Common bile duct appears dilated estimated to measure proximally 13 mm in the porta hepatis. Small 5 mm calcification noted in the region of the distal common bile duct, concerning for choledocholithiasis (axial image 30 of series 1). There may be some mild intrahepatic biliary ductal dilatation, difficult to judge on today's noncontrast CT examination. Pancreas: No pancreatic mass.  No pancreatic ductal dilatation. No pancreatic or peripancreatic fluid collections or inflammatory changes. Spleen: Unremarkable. Adrenals/Urinary Tract: No calcifications are identified within the collecting system of either kidney, along the course of either ureter, or within the lumen of the urinary bladder. No hydroureteronephrosis. There are several right-sided renal lesions, incompletely characterized on today's noncontrast CT examination, most concerning of which in the upper pole measures 1.2 cm in diameter and is 42 HU. Unenhanced appearance of the left kidney is unremarkable. Urinary bladder is nearly completely decompressed, but otherwise unremarkable in appearance. Stomach/Bowel: The appearance of the intra-abdominal portion of the stomach is unremarkable. Periampullary duodenal diverticulum without surrounding inflammatory changes to suggest an acute diverticulitis at this time. No pathologic dilatation of small bowel or colon. Numerous colonic diverticula are noted, without surrounding inflammatory changes to indicate an acute diverticulitis at this time. The appendix is not confidently identified and may be surgically absent. Regardless, there are no inflammatory changes noted adjacent to the cecum to suggest the presence of an acute appendicitis at  this time. Vascular/Lymphatic: Atherosclerosis in the abdominal aorta and pelvic vasculature. No lymphadenopathy noted in the abdomen or pelvis. Reproductive: Uterus and ovaries are atrophic. Other: No significant volume of ascites.  No pneumoperitoneum. Musculoskeletal: Chronic appearing compression fracture of L2 superior endplate with [id number] loss of anterior vertebral body height. There is also some compression of the inferior endplate of L4 vertebral body most evident on the left side where there is up to 50% loss of vertebral body height. Advanced degenerative disc disease noted at L4-L5 with extensive discogenic changes. There are no aggressive appearing lytic or blastic lesions noted in the visualized portions of the skeleton. IMPRESSION: 1. No urinary tract calculi or findings of urinary tract obstruction. 2. Findings are concerning for probable obstructive choledocholithiasis. Further evaluation with abdominal MRI with and without IV gadolinium with MRCP is recommended at this time if there is any clinical or biochemical evidence concerning for biliary tract obstruction. There also appear to be multiple noncalcified gallstones filling the lumen of the gallbladder (difficult to say for certain on today's CT examination), with moderate distension of the gallbladder, but no overt imaging findings to suggest an acute cholecystitis at this time. 3. Moderate to large hiatal hernia. 4. Colonic diverticulosis without evidence of acute diverticulitis at this time. 5. Bronchiectasis and mild scarring in the lung bases bilaterally. 6. Additional incidental findings, as above. Electronically Signed   By: Vinnie Langton M.D.   On: 07/05/2022 07:13      Assessment/Plan Back pain ?Choledocholithiasis - 3 weeks of progressive back pain, worse with movement - CT questions choledocholithiasis - RUQ [place] with no signs of cholecystitis, no shadowing stones and a borderline dilated common bile duct at 69m - lipase very  mildly elevated at 78, LFTs otherwise unremarkable and Tbili normal at 0.7 - WBC very mildly elevated at 11.8, HD stable this AM - recommend medical admission and GI consult. Symptoms not completely typical for choledocholithiasis, discussed this with ED provider who will reevaluate. Await MRCP.  FEN: NPO VTE: ok for chemical dvt ppx from surgical standpoint ID: no need for abx at this time from our standpoint  - below per primary - Chronic L2 compression fx  HTN HLD GERD Osteoporosis/arthritis  Hx of Herniated lumbar disc  I reviewed ED provider notes, last 24 h vitals and pain scores, last 48 h intake and output, last 24 h labs and trends, and last 24 h imaging results.   BMargie Billet PA-C [facility] 07/05/2022, 10:56 AM Please see Amion for pager number during day hours 7:00am-4:30pm

## 2022-07-06 ENCOUNTER — Encounter (HOSPITAL_COMMUNITY)
Admission: EM | Disposition: A | Discharge status: Home / Self Care | Payer: Self-pay | Source: Home / Self Care | Attending: Internal Medicine

## 2022-07-06 ENCOUNTER — Telehealth: Payer: Self-pay

## 2022-07-06 DIAGNOSIS — K8020 Calculus of gallbladder without cholecystitis without obstruction: Secondary | ICD-10-CM | POA: Diagnosis not present

## 2022-07-06 DIAGNOSIS — K8050 Calculus of bile duct without cholangitis or cholecystitis without obstruction: Secondary | ICD-10-CM | POA: Diagnosis not present

## 2022-07-06 LAB — COMPREHENSIVE METABOLIC PANEL
ALT: 25 U/L (ref 0–44)
AST: 28 U/L (ref 15–41)
Albumin: 3 g/dL — ABNORMAL LOW (ref 3.5–5.0)
Alkaline Phosphatase: 76 U/L (ref 38–126)
Anion gap: 9 (ref 5–15)
BUN: 20 mg/dL (ref 8–23)
CO2: 21 mmol/L — ABNORMAL LOW (ref 22–32)
Calcium: 8.6 mg/dL — ABNORMAL LOW (ref 8.9–10.3)
Chloride: 106 mmol/L (ref 98–111)
Creatinine, Ser: 1.16 mg/dL — ABNORMAL HIGH (ref 0.44–1.00)
GFR, Estimated: 49 mL/min — ABNORMAL LOW (ref >60)
Glucose, Bld: 99 mg/dL (ref 70–99)
Potassium: 4.5 mmol/L (ref 3.5–5.1)
Sodium: 136 mmol/L (ref 135–145)
Total Bilirubin: 0.8 mg/dL (ref 0.3–1.2)
Total Protein: 5.8 g/dL — ABNORMAL LOW (ref 6.5–8.1)

## 2022-07-06 LAB — CBC
HCT: 39.4 % (ref 36.0–46.0)
Hemoglobin: 13.1 g/dL (ref 12.0–15.0)
MCH: 32.3 pg (ref 26.0–34.0)
MCHC: 33.2 g/dL (ref 30.0–36.0)
MCV: 97 fL (ref 80.0–100.0)
Platelets: 307 10*3/uL (ref 150–400)
RBC: 4.06 MIL/uL (ref 3.87–5.11)
RDW: 13.1 % (ref 11.5–15.5)
WBC: 9.5 10*3/uL (ref 4.0–10.5)
nRBC: 0 % (ref 0.0–0.2)

## 2022-07-06 SURGERY — ERCP, WITH INTERVENTION IF INDICATED
Anesthesia: General

## 2022-07-06 MED ORDER — LISINOPRIL 10 MG PO TABS
10.0000 mg | ORAL_TABLET | Freq: Every day | ORAL | Status: DC
  Administered 2022-07-06 – 2022-07-07 (×2): 10 mg via ORAL
  Filled 2022-07-06 (×3): qty 1

## 2022-07-06 MED ORDER — LISINOPRIL-HYDROCHLOROTHIAZIDE 20-12.5 MG PO TABS: 0.5000 | ORAL_TABLET | Freq: Every day | ORAL | Status: DC

## 2022-07-06 MED ORDER — LIDOCAINE 5 % EX PTCH
1.0000 | MEDICATED_PATCH | CUTANEOUS | Status: DC
  Administered 2022-07-06 – 2022-07-07 (×2): 1 via TRANSDERMAL
  Filled 2022-07-06 (×2): qty 1

## 2022-07-06 MED ORDER — HYDROCHLOROTHIAZIDE 12.5 MG PO TABS
6.2500 mg | ORAL_TABLET | Freq: Every day | ORAL | Status: DC
  Administered 2022-07-06 – 2022-07-07 (×2): 6.25 mg via ORAL
  Filled 2022-07-06 (×2): qty 1

## 2022-07-06 MED ORDER — POLYVINYL ALCOHOL 1.4 % OP SOLN: 1.0000 [drp] | OPHTHALMIC | Status: DC | PRN

## 2022-07-06 MED ORDER — ENOXAPARIN SODIUM 40 MG/0.4ML IJ SOSY
40.0000 mg | PREFILLED_SYRINGE | INTRAMUSCULAR | Status: DC
  Administered 2022-07-06 – 2022-07-07 (×2): 40 mg via SUBCUTANEOUS
  Filled 2022-07-06 (×2): qty 0.4

## 2022-07-06 MED ORDER — MORPHINE SULFATE (PF) 2 MG/ML IV SOLN: 2.0000 mg | INTRAVENOUS | Status: DC | PRN

## 2022-07-06 MED ORDER — ASPIRIN 81 MG PO TBEC
81.0000 mg | DELAYED_RELEASE_TABLET | Freq: Every day | ORAL | Status: DC
  Administered 2022-07-06 – 2022-07-07 (×2): 81 mg via ORAL
  Filled 2022-07-06 (×2): qty 1

## 2022-07-06 MED ORDER — ASPIRIN 81 MG PO TBEC
81.0000 mg | DELAYED_RELEASE_TABLET | Freq: Every day | ORAL | Status: DC
  Filled 2022-07-06: qty 1

## 2022-07-06 MED ORDER — POLYETHYL GLYCOL-PROPYL GLYCOL 0.4-0.3 % OP SOLN: 1.0000 [drp] | Freq: Every day | OPHTHALMIC | Status: DC | PRN

## 2022-07-06 MED ORDER — FLUTICASONE PROPIONATE 50 MCG/ACT NA SUSP
2.0000 | Freq: Every day | NASAL | Status: DC
  Administered 2022-07-07: 2 via NASAL
  Filled 2022-07-06: qty 16

## 2022-07-06 NOTE — Telephone Encounter (Signed)
-----   Message from Levin Erp, [place] sent at 07/06/2022 12:44 PM EST ----- Regarding: EUS/ERCP Camie Hauss Dr. Rush Landmark asked that I reach out to you in regards to scheduling this patient for direct EUS possible ERCP for abnormal bile duct and possible choledocholithiasis.  She is currently in the hospital but we are unable to get her scheduled while she is here.  She is aware that we will be reaching out to her to schedule in the outpatient setting.  Thanks, JL L

## 2022-07-06 NOTE — Progress Notes (Signed)
Orthopedic Tech Progress Note Patient Details:  Cheryl Lee [birth year] [id number]  Ortho Devices Type of Ortho Device: Lumbar corsett Ortho Device/Splint Interventions: Ordered, Adjustment   Post Interventions Instructions Provided: Care of device, Adjustment of device  [place] [facility] 07/06/2022, 3:51 PM

## 2022-07-06 NOTE — Telephone Encounter (Signed)
Dr Rush Landmark is April ok for this pt?

## 2022-07-06 NOTE — Progress Notes (Signed)
Subjective/Chief Complaint: Back pain, worse with movement. No abdominal pain or nausea. +Constipation   Objective: Vital signs in last 24 hours: Temp:  [98 F (36.7 C)-98.6 F (37 C)] 98.1 F (36.7 C) (02/23 0402) Pulse Rate:  [84-119] 93 (02/23 0402) Resp:  [16-20] 18 (02/22 1918) BP: (116-142)/(68-93) 142/89 (02/23 0402) SpO2:  [97 %-100 %] 98 % (02/23 0402) Weight:  [62 kg] 62 kg (02/22 1443) Last BM Date : 07/05/22  Intake/Output from previous day: No intake/output data recorded. Intake/Output this shift: No intake/output data recorded.  A&Ox3 Unlabored respirations Abd s/nt/nd  Lab Results:  Recent Labs    07/05/22 0609 07/06/22 0418  WBC 11.8* 9.5  HGB 14.6 13.1  HCT 43.9 39.4  PLT 404* 307   BMET Recent Labs    07/05/22 0609 07/06/22 0558  NA 139 136  K 4.3 4.5  CL 105 106  CO2 22 21*  GLUCOSE 110* 99  BUN 31* 20  CREATININE 1.37* 1.16*  CALCIUM 9.6 8.6*   PT/INR No results for input(s): "LABPROT", "INR" in the last 72 hours. ABG No results for input(s): "PHART", "HCO3" in the last 72 hours.  Invalid input(s): "PCO2", "PO2"  Studies/Results: MR ABDOMEN MRCP W WO CONTAST  Result Date: 07/05/2022 CLINICAL DATA:  Right-sided abdominal pain for 3 weeks. Biliary ductal dilatation on recent ultrasound. EXAM: MRI ABDOMEN WITHOUT AND WITH CONTRAST (INCLUDING MRCP) TECHNIQUE: Multiplanar multisequence MR imaging of the abdomen was performed both before and after the administration of intravenous contrast. Heavily T2-weighted images of the biliary and pancreatic ducts were obtained, and three-dimensional MRCP images were rendered by post processing. CONTRAST:  65m GADAVIST GADOBUTROL 1 MMOL/ML IV SOLN COMPARISON:  Ultrasound and noncontrast CT on 07/05/2022 FINDINGS: Lower chest: No acute findings. Hepatobiliary: No hepatic masses identified. A few gallstones are noted, largest measuring approximately 1 cm. No evidence of cholecystitis. Diffuse biliary  ductal dilatation is seen with common bile duct measuring 17 mm in diameter. A few tiny calculi are seen in the distal common bile duct, largest measuring approximately 5 mm. Pancreas: No mass or acute inflammatory changes. Diffuse irregular appearance of the pancreatic duct is noted, without evidence of pancreatic ductal dilatation or pancreas divisum. This is consistent with chronic pancreatitis. No peripancreatic fluid collections are seen. Spleen:  Within normal limits in size and appearance. Adrenals/Urinary Tract: No suspicious masses identified. No evidence of hydronephrosis. Stomach/Bowel: Moderate size hiatal hernia is seen. Probable left colonic diverticulosis, however there is no evidence of diverticulitis in this region. Vascular/Lymphatic: No pathologically enlarged lymph nodes identified. No acute vascular findings. Other:  None. Musculoskeletal:  No suspicious bone lesions identified. IMPRESSION: Cholelithiasis. No radiographic evidence of cholecystitis. Diffuse biliary ductal dilatation, with a few tiny calculi in the distal common bile duct measuring up to 5 mm. Findings of chronic pancreatitis. No radiographic signs of acute pancreatitis. Moderate hiatal hernia. Electronically Signed   By: JMarlaine HindM.D.   On: 07/05/2022 14:43   [place] Limited RUQ (LIVER/GB)  Result Date: 07/05/2022 CLINICAL DATA:  Right upper quadrant pain. EXAM: ULTRASOUND ABDOMEN LIMITED RIGHT UPPER QUADRANT COMPARISON:  Same day CT abdomen/pelvis FINDINGS: Gallbladder: The gallbladder is distended but otherwise unremarkable. There are no shadowing stones. There is no gallbladder wall thickening or pericholecystic fluid. Sonographic Murphy's sign was reported negative. Common bile duct: Diameter: 8 mm, at the upper limits of normal for size given age. Liver: No focal lesion identified. Within normal limits in parenchymal echogenicity. Portal vein is patent on color Doppler  imaging with normal direction of blood flow  towards the liver. Other: None. IMPRESSION: 1. Distended but otherwise unremarkable gallbladder with no shadowing stone or evidence of acute cholecystitis. 2. Borderline dilated common bile duct measuring up to 8 mm. No obstructing lesion is seen. Correlate with laboratory values, and if there is high clinical concern for biliary obstruction, MRCP may be considered. Electronically Signed   By: Valetta Mole M.D.   On: 07/05/2022 09:01   CT Renal Stone Study  Result Date: 07/05/2022 CLINICAL DATA:  77 year old female presenting with history of right-sided flank and abdominal pain. EXAM: CT ABDOMEN AND PELVIS WITHOUT CONTRAST CT LUMBAR SPINE WITHOUT CONTRAST TECHNIQUE: Multidetector CT imaging of the abdomen and pelvis was performed following the standard protocol without IV contrast. Dedicated multiplanar reconstructions through the lumbar spine were also generated for interpretation purposes. RADIATION DOSE REDUCTION: This exam was performed according to the departmental dose-optimization program which includes automated exposure control, adjustment of the mA and/or kV according to patient size and/or use of iterative reconstruction technique. COMPARISON:  No priors. FINDINGS: Lower chest: Scattered areas of mild cylindrical bronchiectasis and what appears to be areas of mild chronic post infectious or inflammatory scarring noted in the lung bases bilaterally. Atherosclerotic calcifications in the descending thoracic aorta as well as the right coronary artery. Moderate to large hiatal hernia. Hepatobiliary: No definite suspicious cystic or solid hepatic lesions are confidently identified on today's noncontrast CT examination. Gallbladder is moderately distended. Slight heterogeneous attenuation within the lumen of the gallbladder noted, which could suggest the presence of multiple noncalcified gallstones. Gallbladder wall does not appear thickened. No pericholecystic fluid or surrounding inflammatory changes.  Common bile duct appears dilated estimated to measure proximally 13 mm in the porta hepatis. Small 5 mm calcification noted in the region of the distal common bile duct, concerning for choledocholithiasis (axial image 30 of series 1). There may be some mild intrahepatic biliary ductal dilatation, difficult to judge on today's noncontrast CT examination. Pancreas: No pancreatic mass. No pancreatic ductal dilatation. No pancreatic or peripancreatic fluid collections or inflammatory changes. Spleen: Unremarkable. Adrenals/Urinary Tract: No calcifications are identified within the collecting system of either kidney, along the course of either ureter, or within the lumen of the urinary bladder. No hydroureteronephrosis. There are several right-sided renal lesions, incompletely characterized on today's noncontrast CT examination, most concerning of which in the upper pole measures 1.2 cm in diameter and is 42 HU. Unenhanced appearance of the left kidney is unremarkable. Urinary bladder is nearly completely decompressed, but otherwise unremarkable in appearance. Stomach/Bowel: The appearance of the intra-abdominal portion of the stomach is unremarkable. Periampullary duodenal diverticulum without surrounding inflammatory changes to suggest an acute diverticulitis at this time. No pathologic dilatation of small bowel or colon. Numerous colonic diverticula are noted, without surrounding inflammatory changes to indicate an acute diverticulitis at this time. The appendix is not confidently identified and may be surgically absent. Regardless, there are no inflammatory changes noted adjacent to the cecum to suggest the presence of an acute appendicitis at this time. Vascular/Lymphatic: Atherosclerosis in the abdominal aorta and pelvic vasculature. No lymphadenopathy noted in the abdomen or pelvis. Reproductive: Uterus and ovaries are atrophic. Other: No significant volume of ascites.  No pneumoperitoneum. Musculoskeletal:  Chronic appearing compression fracture of L2 superior endplate with [id number] loss of anterior vertebral body height. There is also some compression of the inferior endplate of L4 vertebral body most evident on the left side where there is up to 50% loss of vertebral body  height. Advanced degenerative disc disease noted at L4-L5 with extensive discogenic changes. There are no aggressive appearing lytic or blastic lesions noted in the visualized portions of the skeleton. IMPRESSION: 1. No urinary tract calculi or findings of urinary tract obstruction. 2. Findings are concerning for probable obstructive choledocholithiasis. Further evaluation with abdominal MRI with and without IV gadolinium with MRCP is recommended at this time if there is any clinical or biochemical evidence concerning for biliary tract obstruction. There also appear to be multiple noncalcified gallstones filling the lumen of the gallbladder (difficult to say for certain on today's CT examination), with moderate distension of the gallbladder, but no overt imaging findings to suggest an acute cholecystitis at this time. 3. Moderate to large hiatal hernia. 4. Colonic diverticulosis without evidence of acute diverticulitis at this time. 5. Bronchiectasis and mild scarring in the lung bases bilaterally. 6. Additional incidental findings, as above. Electronically Signed   By: Vinnie Langton M.D.   On: 07/05/2022 07:13   CT L-SPINE NO CHARGE  Result Date: 07/05/2022 CLINICAL DATA:  77 year old female presenting with history of right-sided flank and abdominal pain. EXAM: CT ABDOMEN AND PELVIS WITHOUT CONTRAST CT LUMBAR SPINE WITHOUT CONTRAST TECHNIQUE: Multidetector CT imaging of the abdomen and pelvis was performed following the standard protocol without IV contrast. Dedicated multiplanar reconstructions through the lumbar spine were also generated for interpretation purposes. RADIATION DOSE REDUCTION: This exam was performed according to the departmental  dose-optimization program which includes automated exposure control, adjustment of the mA and/or kV according to patient size and/or use of iterative reconstruction technique. COMPARISON:  No priors. FINDINGS: Lower chest: Scattered areas of mild cylindrical bronchiectasis and what appears to be areas of mild chronic post infectious or inflammatory scarring noted in the lung bases bilaterally. Atherosclerotic calcifications in the descending thoracic aorta as well as the right coronary artery. Moderate to large hiatal hernia. Hepatobiliary: No definite suspicious cystic or solid hepatic lesions are confidently identified on today's noncontrast CT examination. Gallbladder is moderately distended. Slight heterogeneous attenuation within the lumen of the gallbladder noted, which could suggest the presence of multiple noncalcified gallstones. Gallbladder wall does not appear thickened. No pericholecystic fluid or surrounding inflammatory changes. Common bile duct appears dilated estimated to measure proximally 13 mm in the porta hepatis. Small 5 mm calcification noted in the region of the distal common bile duct, concerning for choledocholithiasis (axial image 30 of series 1). There may be some mild intrahepatic biliary ductal dilatation, difficult to judge on today's noncontrast CT examination. Pancreas: No pancreatic mass. No pancreatic ductal dilatation. No pancreatic or peripancreatic fluid collections or inflammatory changes. Spleen: Unremarkable. Adrenals/Urinary Tract: No calcifications are identified within the collecting system of either kidney, along the course of either ureter, or within the lumen of the urinary bladder. No hydroureteronephrosis. There are several right-sided renal lesions, incompletely characterized on today's noncontrast CT examination, most concerning of which in the upper pole measures 1.2 cm in diameter and is 42 HU. Unenhanced appearance of the left kidney is unremarkable. Urinary  bladder is nearly completely decompressed, but otherwise unremarkable in appearance. Stomach/Bowel: The appearance of the intra-abdominal portion of the stomach is unremarkable. Periampullary duodenal diverticulum without surrounding inflammatory changes to suggest an acute diverticulitis at this time. No pathologic dilatation of small bowel or colon. Numerous colonic diverticula are noted, without surrounding inflammatory changes to indicate an acute diverticulitis at this time. The appendix is not confidently identified and may be surgically absent. Regardless, there are no inflammatory changes noted adjacent to  the cecum to suggest the presence of an acute appendicitis at this time. Vascular/Lymphatic: Atherosclerosis in the abdominal aorta and pelvic vasculature. No lymphadenopathy noted in the abdomen or pelvis. Reproductive: Uterus and ovaries are atrophic. Other: No significant volume of ascites.  No pneumoperitoneum. Musculoskeletal: Chronic appearing compression fracture of L2 superior endplate with [id number] loss of anterior vertebral body height. There is also some compression of the inferior endplate of L4 vertebral body most evident on the left side where there is up to 50% loss of vertebral body height. Advanced degenerative disc disease noted at L4-L5 with extensive discogenic changes. There are no aggressive appearing lytic or blastic lesions noted in the visualized portions of the skeleton. IMPRESSION: 1. No urinary tract calculi or findings of urinary tract obstruction. 2. Findings are concerning for probable obstructive choledocholithiasis. Further evaluation with abdominal MRI with and without IV gadolinium with MRCP is recommended at this time if there is any clinical or biochemical evidence concerning for biliary tract obstruction. There also appear to be multiple noncalcified gallstones filling the lumen of the gallbladder (difficult to say for certain on today's CT examination), with moderate  distension of the gallbladder, but no overt imaging findings to suggest an acute cholecystitis at this time. 3. Moderate to large hiatal hernia. 4. Colonic diverticulosis without evidence of acute diverticulitis at this time. 5. Bronchiectasis and mild scarring in the lung bases bilaterally. 6. Additional incidental findings, as above. Electronically Signed   By: Vinnie Langton M.D.   On: 07/05/2022 07:13    Anti-infectives: Anti-infectives (From admission, onward)    Start     Dose/Rate Route Frequency Ordered Stop   07/05/22 2030  ampicillin-sulbactam (UNASYN) 1.5 g in sodium chloride 0.9 % 100 mL IVPB        1.5 g 200 mL/hr over 30 Minutes Intravenous  Once 07/05/22 1933 07/05/22 2310       Assessment/Plan: Back pain Choledocholithiasis - 3 weeks of progressive back pain, worse with movement - CT questions choledocholithiasis - RUQ [place] with no signs of cholecystitis, no shadowing stones and a borderline dilated common bile duct at 30m -MRCP+ for cholelithiasis and choledocholithiasis - WBC and LFTs normal, lipase minimally elevated - GI plans for ERCP +/- EUS today. Will follow for timing of cholecystectomy. I did discuss the surgery with her and her daughter and the indication for doing this, which is preventative. Discussed risks of surgery including bleeding, pain, scarring, intraabdominal injury specifically to the common bile duct and sequelae, bile leak, conversion to open surgery, subtotal cholecystectomy, blood clot, pneumonia, heart attack, stroke, failure to resolve symptoms, etc. Questions welcomed and answered.   - Suspect her back pain is more related to msk etiology- defer to primary team   FEN: NPO VTE: ok for chemical dvt ppx from surgical standpoint ID: no need for abx at this time from our standpoint   - below per primary - Chronic L2 compression fx  HTN HLD GERD Osteoporosis/arthritis  Hx of Herniated lumbar disc   I reviewed ED provider notes, last 24 h  vitals and pain scores, last 48 h intake and output, last 24 h labs and trends, and last 24 h imaging results.  LOS: 0 days    CClovis Riley2/23/2024

## 2022-07-06 NOTE — Plan of Care (Signed)

## 2022-07-06 NOTE — Progress Notes (Signed)
Providing Compassionate, Quality Care - Together    Patient with back pain and a new L2 compression fracture. She has a history of a left L4-5 microdiscectomy with Dr. Saintclair Halsted that was performed on 05/01/2017. Recommend conservative management at this time with an LSO corset and symptom management. She is fine to mobilize with therapies. She can follow up with Dr. Saintclair Halsted as an outpatient in 4-6 weeks.   Vital signs in last 24 hours: Temp:  [97.8 F (36.6 C)-98.6 F (37 C)] 97.8 F (36.6 C) (02/23 0833) Pulse Rate:  [84-119] 89 (02/23 0833) Resp:  [16-20] 18 (02/23 0833) BP: (116-146)/(68-93) 146/81 (02/23 0833) SpO2:  [97 %-100 %] 98 % (02/23 0833) Weight:  [62 kg] 62 kg (02/22 1443)  Intake/Output from previous day: No intake/output data recorded. Intake/Output this shift: No intake/output data recorded.    Lab Results: Recent Labs    07/05/22 0609 07/06/22 0418  WBC 11.8* 9.5  HGB 14.6 13.1  HCT 43.9 39.4  PLT 404* 307   BMET Recent Labs    07/05/22 0609 07/06/22 0558  NA 139 136  K 4.3 4.5  CL 105 106  CO2 22 21*  GLUCOSE 110* 99  BUN 31* 20  CREATININE 1.37* 1.16*  CALCIUM 9.6 8.6*    Studies/Results: MR ABDOMEN MRCP W WO CONTAST  Result Date: 07/05/2022 CLINICAL DATA:  Right-sided abdominal pain for 3 weeks. Biliary ductal dilatation on recent ultrasound. EXAM: MRI ABDOMEN WITHOUT AND WITH CONTRAST (INCLUDING MRCP) TECHNIQUE: Multiplanar multisequence MR imaging of the abdomen was performed both before and after the administration of intravenous contrast. Heavily T2-weighted images of the biliary and pancreatic ducts were obtained, and three-dimensional MRCP images were rendered by post processing. CONTRAST:  57m GADAVIST GADOBUTROL 1 MMOL/ML IV SOLN COMPARISON:  Ultrasound and noncontrast CT on 07/05/2022 FINDINGS: Lower chest: No acute findings. Hepatobiliary: No hepatic masses identified. A few gallstones are noted, largest measuring approximately 1 cm. No  evidence of cholecystitis. Diffuse biliary ductal dilatation is seen with common bile duct measuring 17 mm in diameter. A few tiny calculi are seen in the distal common bile duct, largest measuring approximately 5 mm. Pancreas: No mass or acute inflammatory changes. Diffuse irregular appearance of the pancreatic duct is noted, without evidence of pancreatic ductal dilatation or pancreas divisum. This is consistent with chronic pancreatitis. No peripancreatic fluid collections are seen. Spleen:  Within normal limits in size and appearance. Adrenals/Urinary Tract: No suspicious masses identified. No evidence of hydronephrosis. Stomach/Bowel: Moderate size hiatal hernia is seen. Probable left colonic diverticulosis, however there is no evidence of diverticulitis in this region. Vascular/Lymphatic: No pathologically enlarged lymph nodes identified. No acute vascular findings. Other:  None. Musculoskeletal:  No suspicious bone lesions identified. IMPRESSION: Cholelithiasis. No radiographic evidence of cholecystitis. Diffuse biliary ductal dilatation, with a few tiny calculi in the distal common bile duct measuring up to 5 mm. Findings of chronic pancreatitis. No radiographic signs of acute pancreatitis. Moderate hiatal hernia. Electronically Signed   By: JMarlaine HindM.D.   On: 07/05/2022 14:43   [place]domen Limited RUQ (LIVER/GB)  Result Date: 07/05/2022 CLINICAL DATA:  Right upper quadrant pain. EXAM: ULTRASOUND ABDOMEN LIMITED RIGHT UPPER QUADRANT COMPARISON:  Same day CT abdomen/pelvis FINDINGS: Gallbladder: The gallbladder is distended but otherwise unremarkable. There are no shadowing stones. There is no gallbladder wall thickening or pericholecystic fluid. Sonographic Murphy's sign was reported negative. Common bile duct: Diameter: 8 mm, at the upper limits of normal for size given age. Liver: No  focal lesion identified. Within normal limits in parenchymal echogenicity. Portal vein is patent on color Doppler  imaging with normal direction of blood flow towards the liver. Other: None. IMPRESSION: 1. Distended but otherwise unremarkable gallbladder with no shadowing stone or evidence of acute cholecystitis. 2. Borderline dilated common bile duct measuring up to 8 mm. No obstructing lesion is seen. Correlate with laboratory values, and if there is high clinical concern for biliary obstruction, MRCP may be considered. Electronically Signed   By: Valetta Mole M.D.   On: 07/05/2022 09:01   CT Renal Stone Study  Result Date: 07/05/2022 CLINICAL DATA:  77 year old female presenting with history of right-sided flank and abdominal pain. EXAM: CT ABDOMEN AND PELVIS WITHOUT CONTRAST CT LUMBAR SPINE WITHOUT CONTRAST TECHNIQUE: Multidetector CT imaging of the abdomen and pelvis was performed following the standard protocol without IV contrast. Dedicated multiplanar reconstructions through the lumbar spine were also generated for interpretation purposes. RADIATION DOSE REDUCTION: This exam was performed according to the departmental dose-optimization program which includes automated exposure control, adjustment of the mA and/or kV according to patient size and/or use of iterative reconstruction technique. COMPARISON:  No priors. FINDINGS: Lower chest: Scattered areas of mild cylindrical bronchiectasis and what appears to be areas of mild chronic post infectious or inflammatory scarring noted in the lung bases bilaterally. Atherosclerotic calcifications in the descending thoracic aorta as well as the right coronary artery. Moderate to large hiatal hernia. Hepatobiliary: No definite suspicious cystic or solid hepatic lesions are confidently identified on today's noncontrast CT examination. Gallbladder is moderately distended. Slight heterogeneous attenuation within the lumen of the gallbladder noted, which could suggest the presence of multiple noncalcified gallstones. Gallbladder wall does not appear thickened. No pericholecystic  fluid or surrounding inflammatory changes. Common bile duct appears dilated estimated to measure proximally 13 mm in the porta hepatis. Small 5 mm calcification noted in the region of the distal common bile duct, concerning for choledocholithiasis (axial image 30 of series 1). There may be some mild intrahepatic biliary ductal dilatation, difficult to judge on today's noncontrast CT examination. Pancreas: No pancreatic mass. No pancreatic ductal dilatation. No pancreatic or peripancreatic fluid collections or inflammatory changes. Spleen: Unremarkable. Adrenals/Urinary Tract: No calcifications are identified within the collecting system of either kidney, along the course of either ureter, or within the lumen of the urinary bladder. No hydroureteronephrosis. There are several right-sided renal lesions, incompletely characterized on today's noncontrast CT examination, most concerning of which in the upper pole measures 1.2 cm in diameter and is 42 HU. Unenhanced appearance of the left kidney is unremarkable. Urinary bladder is nearly completely decompressed, but otherwise unremarkable in appearance. Stomach/Bowel: The appearance of the intra-abdominal portion of the stomach is unremarkable. Periampullary duodenal diverticulum without surrounding inflammatory changes to suggest an acute diverticulitis at this time. No pathologic dilatation of small bowel or colon. Numerous colonic diverticula are noted, without surrounding inflammatory changes to indicate an acute diverticulitis at this time. The appendix is not confidently identified and may be surgically absent. Regardless, there are no inflammatory changes noted adjacent to the cecum to suggest the presence of an acute appendicitis at this time. Vascular/Lymphatic: Atherosclerosis in the abdominal aorta and pelvic vasculature. No lymphadenopathy noted in the abdomen or pelvis. Reproductive: Uterus and ovaries are atrophic. Other: No significant volume of ascites.   No pneumoperitoneum. Musculoskeletal: Chronic appearing compression fracture of L2 superior endplate with [id number] loss of anterior vertebral body height. There is also some compression of the inferior endplate of L4 vertebral body  most evident on the left side where there is up to 50% loss of vertebral body height. Advanced degenerative disc disease noted at L4-L5 with extensive discogenic changes. There are no aggressive appearing lytic or blastic lesions noted in the visualized portions of the skeleton. IMPRESSION: 1. No urinary tract calculi or findings of urinary tract obstruction. 2. Findings are concerning for probable obstructive choledocholithiasis. Further evaluation with abdominal MRI with and without IV gadolinium with MRCP is recommended at this time if there is any clinical or biochemical evidence concerning for biliary tract obstruction. There also appear to be multiple noncalcified gallstones filling the lumen of the gallbladder (difficult to say for certain on today's CT examination), with moderate distension of the gallbladder, but no overt imaging findings to suggest an acute cholecystitis at this time. 3. Moderate to large hiatal hernia. 4. Colonic diverticulosis without evidence of acute diverticulitis at this time. 5. Bronchiectasis and mild scarring in the lung bases bilaterally. 6. Additional incidental findings, as above. Electronically Signed   By: Vinnie Langton M.D.   On: 07/05/2022 07:13   CT L-SPINE NO CHARGE  Result Date: 07/05/2022 CLINICAL DATA:  77 year old female presenting with history of right-sided flank and abdominal pain. EXAM: CT ABDOMEN AND PELVIS WITHOUT CONTRAST CT LUMBAR SPINE WITHOUT CONTRAST TECHNIQUE: Multidetector CT imaging of the abdomen and pelvis was performed following the standard protocol without IV contrast. Dedicated multiplanar reconstructions through the lumbar spine were also generated for interpretation purposes. RADIATION DOSE REDUCTION: This exam was  performed according to the departmental dose-optimization program which includes automated exposure control, adjustment of the mA and/or kV according to patient size and/or use of iterative reconstruction technique. COMPARISON:  No priors. FINDINGS: Lower chest: Scattered areas of mild cylindrical bronchiectasis and what appears to be areas of mild chronic post infectious or inflammatory scarring noted in the lung bases bilaterally. Atherosclerotic calcifications in the descending thoracic aorta as well as the right coronary artery. Moderate to large hiatal hernia. Hepatobiliary: No definite suspicious cystic or solid hepatic lesions are confidently identified on today's noncontrast CT examination. Gallbladder is moderately distended. Slight heterogeneous attenuation within the lumen of the gallbladder noted, which could suggest the presence of multiple noncalcified gallstones. Gallbladder wall does not appear thickened. No pericholecystic fluid or surrounding inflammatory changes. Common bile duct appears dilated estimated to measure proximally 13 mm in the porta hepatis. Small 5 mm calcification noted in the region of the distal common bile duct, concerning for choledocholithiasis (axial image 30 of series 1). There may be some mild intrahepatic biliary ductal dilatation, difficult to judge on today's noncontrast CT examination. Pancreas: No pancreatic mass. No pancreatic ductal dilatation. No pancreatic or peripancreatic fluid collections or inflammatory changes. Spleen: Unremarkable. Adrenals/Urinary Tract: No calcifications are identified within the collecting system of either kidney, along the course of either ureter, or within the lumen of the urinary bladder. No hydroureteronephrosis. There are several right-sided renal lesions, incompletely characterized on today's noncontrast CT examination, most concerning of which in the upper pole measures 1.2 cm in diameter and is 42 HU. Unenhanced appearance of the  left kidney is unremarkable. Urinary bladder is nearly completely decompressed, but otherwise unremarkable in appearance. Stomach/Bowel: The appearance of the intra-abdominal portion of the stomach is unremarkable. Periampullary duodenal diverticulum without surrounding inflammatory changes to suggest an acute diverticulitis at this time. No pathologic dilatation of small bowel or colon. Numerous colonic diverticula are noted, without surrounding inflammatory changes to indicate an acute diverticulitis at this time. The appendix is not  confidently identified and may be surgically absent. Regardless, there are no inflammatory changes noted adjacent to the cecum to suggest the presence of an acute appendicitis at this time. Vascular/Lymphatic: Atherosclerosis in the abdominal aorta and pelvic vasculature. No lymphadenopathy noted in the abdomen or pelvis. Reproductive: Uterus and ovaries are atrophic. Other: No significant volume of ascites.  No pneumoperitoneum. Musculoskeletal: Chronic appearing compression fracture of L2 superior endplate with [id number] loss of anterior vertebral body height. There is also some compression of the inferior endplate of L4 vertebral body most evident on the left side where there is up to 50% loss of vertebral body height. Advanced degenerative disc disease noted at L4-L5 with extensive discogenic changes. There are no aggressive appearing lytic or blastic lesions noted in the visualized portions of the skeleton. IMPRESSION: 1. No urinary tract calculi or findings of urinary tract obstruction. 2. Findings are concerning for probable obstructive choledocholithiasis. Further evaluation with abdominal MRI with and without IV gadolinium with MRCP is recommended at this time if there is any clinical or biochemical evidence concerning for biliary tract obstruction. There also appear to be multiple noncalcified gallstones filling the lumen of the gallbladder (difficult to say for certain on today's  CT examination), with moderate distension of the gallbladder, but no overt imaging findings to suggest an acute cholecystitis at this time. 3. Moderate to large hiatal hernia. 4. Colonic diverticulosis without evidence of acute diverticulitis at this time. 5. Bronchiectasis and mild scarring in the lung bases bilaterally. 6. Additional incidental findings, as above. Electronically Signed   By: Vinnie Langton M.D.   On: 07/05/2022 07:13       LOS: 0 days     Viona Gilmore, DNP, AGNP-C Nurse Practitioner  [facility] [place] [address], [place], [place] [address] P: [phone]    F: [phone]  07/06/2022, 1:59 PM

## 2022-07-06 NOTE — Progress Notes (Signed)
Progress Note   Patient: Cheryl Lee [id number] DOB: [birth year] DOA: 07/05/2022     0 DOS: the patient was seen and examined on 07/06/2022   Brief hospital course: Patient with h/o HTN, HLD who presented with back pain x 3 weeks.  CT showed concern for obstructive choledocholithiasis. Despite this, her primary concern appears to be back pain.   Assessment and Plan: No notes have been filed under this hospital service. Service: Hospitalist   Choledocholithiasis -Appears to be an incidental finding -Also with chronic pancreatitis on imaging -She as initially scheduled for ERCP but there is no room on the schedule and this will be deferred to the outpatient setting -Surgery and GI consulting -?lap chole with intra-operative cholangiogram? -Will defer to surgery -GI is signing off -She was given 1 dose of Unasyn, will continue to hold antibiotics at this time  Back pain -Chronic L2 compression fracture - diagnosed on 2/17 at urgent care with no improvement after Decadron injection, lidocaine patch -She believes this is the primary source of her discomfort -Neurosurgery consulted -She may need MRI for further evaluation vs. IR consult for kyphoplasty -Kyphoplasty is indicated for patients with a >15% height loss with fracture in <3 months as well as for acute, intractable pain associated with fracture -Start Lovenox for DVT prophylaxis -Pain control with Norco, morphine, and lidocaine patch  -May need PT evaluation, currently unable to participate due to pain  HTN -Resume Zestoretic (held yesterday due to mild AKI)  HLD -Continue aorvastatin  GERD -Continue Prilosec  Stage 3a CKD -Mild AKI on presentation, improved -Appears to be stable at this time, back to baseline -Attempt to avoid nephrotoxic medications -Recheck BMP in AM     Subjective: She denies abdominal pain or GI symptoms.  She reports lumbar pain with movement.  She previously had microdiscectomy  surgery with Dr. Saintclair Halsted in 2018.  No neuropathic symptoms or red flag symptoms currently.     Physical Exam: Vitals:   07/05/22 2039 07/06/22 0039 07/06/22 0402 07/06/22 0833  BP: 135/68 138/81 (!) 142/89 (!) 146/81  Pulse: 97 93 93 89  Resp:    18  Temp: 98.2 F (36.8 C) 98 F (36.7 C) 98.1 F (36.7 C) 97.8 F (36.6 C)  TempSrc: Oral  Oral Oral  SpO2: 97% 98% 98% 98%  Weight:      Height:       General:  Appears calm and comfortable and is in NAD, complaining of back pain Eyes:  alternating exotropia, normal lids, iris ENT:  grossly normal hearing, lips & tongue, mmm Neck:  no LAD, masses or thyromegaly Cardiovascular:  RRR, no m/r/g. No LE edema.  Respiratory:   CTA bilaterally with no wheezes/rales/rhonchi.  Normal respiratory effort. Abdomen:  soft, NT, ND Skin:  no rash or induration seen on limited exam Musculoskeletal:  grossly normal tone BUE/BLE, good ROM, no bony abnormality; back pain with leg strength testing Psychiatric:  blunted mood and affect, speech fluent and appropriate, AOx3 Neurologic:  CN 2-12 grossly intact, moves all extremities in coordinated fashion   Radiological Exams on Admission: Independently reviewed - see discussion in A/P where applicable  MR ABDOMEN MRCP W WO CONTAST  Result Date: 07/05/2022 CLINICAL DATA:  Right-sided abdominal pain for 3 weeks. Biliary ductal dilatation on recent ultrasound. EXAM: MRI ABDOMEN WITHOUT AND WITH CONTRAST (INCLUDING MRCP) TECHNIQUE: Multiplanar multisequence MR imaging of the abdomen was performed both before and after the administration of intravenous contrast. Heavily T2-weighted images of the biliary and  pancreatic ducts were obtained, and three-dimensional MRCP images were rendered by post processing. CONTRAST:  7m GADAVIST GADOBUTROL 1 MMOL/ML IV SOLN COMPARISON:  Ultrasound and noncontrast CT on 07/05/2022 FINDINGS: Lower chest: No acute findings. Hepatobiliary: No hepatic masses identified. A few gallstones  are noted, largest measuring approximately 1 cm. No evidence of cholecystitis. Diffuse biliary ductal dilatation is seen with common bile duct measuring 17 mm in diameter. A few tiny calculi are seen in the distal common bile duct, largest measuring approximately 5 mm. Pancreas: No mass or acute inflammatory changes. Diffuse irregular appearance of the pancreatic duct is noted, without evidence of pancreatic ductal dilatation or pancreas divisum. This is consistent with chronic pancreatitis. No peripancreatic fluid collections are seen. Spleen:  Within normal limits in size and appearance. Adrenals/Urinary Tract: No suspicious masses identified. No evidence of hydronephrosis. Stomach/Bowel: Moderate size hiatal hernia is seen. Probable left colonic diverticulosis, however there is no evidence of diverticulitis in this region. Vascular/Lymphatic: No pathologically enlarged lymph nodes identified. No acute vascular findings. Other:  None. Musculoskeletal:  No suspicious bone lesions identified. IMPRESSION: Cholelithiasis. No radiographic evidence of cholecystitis. Diffuse biliary ductal dilatation, with a few tiny calculi in the distal common bile duct measuring up to 5 mm. Findings of chronic pancreatitis. No radiographic signs of acute pancreatitis. Moderate hiatal hernia. Electronically Signed   By: JMarlaine HindM.D.   On: 07/05/2022 14:43   [place]domen Limited RUQ (LIVER/GB)  Result Date: 07/05/2022 CLINICAL DATA:  Right upper quadrant pain. EXAM: ULTRASOUND ABDOMEN LIMITED RIGHT UPPER QUADRANT COMPARISON:  Same day CT abdomen/pelvis FINDINGS: Gallbladder: The gallbladder is distended but otherwise unremarkable. There are no shadowing stones. There is no gallbladder wall thickening or pericholecystic fluid. Sonographic Murphy's sign was reported negative. Common bile duct: Diameter: 8 mm, at the upper limits of normal for size given age. Liver: No focal lesion identified. Within normal limits in parenchymal  echogenicity. Portal vein is patent on color Doppler imaging with normal direction of blood flow towards the liver. Other: None. IMPRESSION: 1. Distended but otherwise unremarkable gallbladder with no shadowing stone or evidence of acute cholecystitis. 2. Borderline dilated common bile duct measuring up to 8 mm. No obstructing lesion is seen. Correlate with laboratory values, and if there is high clinical concern for biliary obstruction, MRCP may be considered. Electronically Signed   By: PValetta MoleM.D.   On: 07/05/2022 09:01   CT Renal Stone Study  Result Date: 07/05/2022 CLINICAL DATA:  77year old female presenting with history of right-sided flank and abdominal pain. EXAM: CT ABDOMEN AND PELVIS WITHOUT CONTRAST CT LUMBAR SPINE WITHOUT CONTRAST TECHNIQUE: Multidetector CT imaging of the abdomen and pelvis was performed following the standard protocol without IV contrast. Dedicated multiplanar reconstructions through the lumbar spine were also generated for interpretation purposes. RADIATION DOSE REDUCTION: This exam was performed according to the departmental dose-optimization program which includes automated exposure control, adjustment of the mA and/or kV according to patient size and/or use of iterative reconstruction technique. COMPARISON:  No priors. FINDINGS: Lower chest: Scattered areas of mild cylindrical bronchiectasis and what appears to be areas of mild chronic post infectious or inflammatory scarring noted in the lung bases bilaterally. Atherosclerotic calcifications in the descending thoracic aorta as well as the right coronary artery. Moderate to large hiatal hernia. Hepatobiliary: No definite suspicious cystic or solid hepatic lesions are confidently identified on today's noncontrast CT examination. Gallbladder is moderately distended. Slight heterogeneous attenuation within the lumen of the gallbladder noted, which could suggest the presence  of multiple noncalcified gallstones. Gallbladder  wall does not appear thickened. No pericholecystic fluid or surrounding inflammatory changes. Common bile duct appears dilated estimated to measure proximally 13 mm in the porta hepatis. Small 5 mm calcification noted in the region of the distal common bile duct, concerning for choledocholithiasis (axial image 30 of series 1). There may be some mild intrahepatic biliary ductal dilatation, difficult to judge on today's noncontrast CT examination. Pancreas: No pancreatic mass. No pancreatic ductal dilatation. No pancreatic or peripancreatic fluid collections or inflammatory changes. Spleen: Unremarkable. Adrenals/Urinary Tract: No calcifications are identified within the collecting system of either kidney, along the course of either ureter, or within the lumen of the urinary bladder. No hydroureteronephrosis. There are several right-sided renal lesions, incompletely characterized on today's noncontrast CT examination, most concerning of which in the upper pole measures 1.2 cm in diameter and is 42 HU. Unenhanced appearance of the left kidney is unremarkable. Urinary bladder is nearly completely decompressed, but otherwise unremarkable in appearance. Stomach/Bowel: The appearance of the intra-abdominal portion of the stomach is unremarkable. Periampullary duodenal diverticulum without surrounding inflammatory changes to suggest an acute diverticulitis at this time. No pathologic dilatation of small bowel or colon. Numerous colonic diverticula are noted, without surrounding inflammatory changes to indicate an acute diverticulitis at this time. The appendix is not confidently identified and may be surgically absent. Regardless, there are no inflammatory changes noted adjacent to the cecum to suggest the presence of an acute appendicitis at this time. Vascular/Lymphatic: Atherosclerosis in the abdominal aorta and pelvic vasculature. No lymphadenopathy noted in the abdomen or pelvis. Reproductive: Uterus and ovaries are  atrophic. Other: No significant volume of ascites.  No pneumoperitoneum. Musculoskeletal: Chronic appearing compression fracture of L2 superior endplate with [id number] loss of anterior vertebral body height. There is also some compression of the inferior endplate of L4 vertebral body most evident on the left side where there is up to 50% loss of vertebral body height. Advanced degenerative disc disease noted at L4-L5 with extensive discogenic changes. There are no aggressive appearing lytic or blastic lesions noted in the visualized portions of the skeleton. IMPRESSION: 1. No urinary tract calculi or findings of urinary tract obstruction. 2. Findings are concerning for probable obstructive choledocholithiasis. Further evaluation with abdominal MRI with and without IV gadolinium with MRCP is recommended at this time if there is any clinical or biochemical evidence concerning for biliary tract obstruction. There also appear to be multiple noncalcified gallstones filling the lumen of the gallbladder (difficult to say for certain on today's CT examination), with moderate distension of the gallbladder, but no overt imaging findings to suggest an acute cholecystitis at this time. 3. Moderate to large hiatal hernia. 4. Colonic diverticulosis without evidence of acute diverticulitis at this time. 5. Bronchiectasis and mild scarring in the lung bases bilaterally. 6. Additional incidental findings, as above. Electronically Signed   By: Vinnie Langton M.D.   On: 07/05/2022 07:13   CT L-SPINE NO CHARGE  Result Date: 07/05/2022 CLINICAL DATA:  77 year old female presenting with history of right-sided flank and abdominal pain. EXAM: CT ABDOMEN AND PELVIS WITHOUT CONTRAST CT LUMBAR SPINE WITHOUT CONTRAST TECHNIQUE: Multidetector CT imaging of the abdomen and pelvis was performed following the standard protocol without IV contrast. Dedicated multiplanar reconstructions through the lumbar spine were also generated for interpretation  purposes. RADIATION DOSE REDUCTION: This exam was performed according to the departmental dose-optimization program which includes automated exposure control, adjustment of the mA and/or kV according to patient size and/or use  of iterative reconstruction technique. COMPARISON:  No priors. FINDINGS: Lower chest: Scattered areas of mild cylindrical bronchiectasis and what appears to be areas of mild chronic post infectious or inflammatory scarring noted in the lung bases bilaterally. Atherosclerotic calcifications in the descending thoracic aorta as well as the right coronary artery. Moderate to large hiatal hernia. Hepatobiliary: No definite suspicious cystic or solid hepatic lesions are confidently identified on today's noncontrast CT examination. Gallbladder is moderately distended. Slight heterogeneous attenuation within the lumen of the gallbladder noted, which could suggest the presence of multiple noncalcified gallstones. Gallbladder wall does not appear thickened. No pericholecystic fluid or surrounding inflammatory changes. Common bile duct appears dilated estimated to measure proximally 13 mm in the porta hepatis. Small 5 mm calcification noted in the region of the distal common bile duct, concerning for choledocholithiasis (axial image 30 of series 1). There may be some mild intrahepatic biliary ductal dilatation, difficult to judge on today's noncontrast CT examination. Pancreas: No pancreatic mass. No pancreatic ductal dilatation. No pancreatic or peripancreatic fluid collections or inflammatory changes. Spleen: Unremarkable. Adrenals/Urinary Tract: No calcifications are identified within the collecting system of either kidney, along the course of either ureter, or within the lumen of the urinary bladder. No hydroureteronephrosis. There are several right-sided renal lesions, incompletely characterized on today's noncontrast CT examination, most concerning of which in the upper pole measures 1.2 cm in  diameter and is 42 HU. Unenhanced appearance of the left kidney is unremarkable. Urinary bladder is nearly completely decompressed, but otherwise unremarkable in appearance. Stomach/Bowel: The appearance of the intra-abdominal portion of the stomach is unremarkable. Periampullary duodenal diverticulum without surrounding inflammatory changes to suggest an acute diverticulitis at this time. No pathologic dilatation of small bowel or colon. Numerous colonic diverticula are noted, without surrounding inflammatory changes to indicate an acute diverticulitis at this time. The appendix is not confidently identified and may be surgically absent. Regardless, there are no inflammatory changes noted adjacent to the cecum to suggest the presence of an acute appendicitis at this time. Vascular/Lymphatic: Atherosclerosis in the abdominal aorta and pelvic vasculature. No lymphadenopathy noted in the abdomen or pelvis. Reproductive: Uterus and ovaries are atrophic. Other: No significant volume of ascites.  No pneumoperitoneum. Musculoskeletal: Chronic appearing compression fracture of L2 superior endplate with [id number] loss of anterior vertebral body height. There is also some compression of the inferior endplate of L4 vertebral body most evident on the left side where there is up to 50% loss of vertebral body height. Advanced degenerative disc disease noted at L4-L5 with extensive discogenic changes. There are no aggressive appearing lytic or blastic lesions noted in the visualized portions of the skeleton. IMPRESSION: 1. No urinary tract calculi or findings of urinary tract obstruction. 2. Findings are concerning for probable obstructive choledocholithiasis. Further evaluation with abdominal MRI with and without IV gadolinium with MRCP is recommended at this time if there is any clinical or biochemical evidence concerning for biliary tract obstruction. There also appear to be multiple noncalcified gallstones filling the lumen of the  gallbladder (difficult to say for certain on today's CT examination), with moderate distension of the gallbladder, but no overt imaging findings to suggest an acute cholecystitis at this time. 3. Moderate to large hiatal hernia. 4. Colonic diverticulosis without evidence of acute diverticulitis at this time. 5. Bronchiectasis and mild scarring in the lung bases bilaterally. 6. Additional incidental findings, as above. Electronically Signed   By: Vinnie Langton M.D.   On: 07/05/2022 07:13    EKG: not done  Labs on Admission: I have personally reviewed the available labs and imaging studies at the time of the admission.  Pertinent labs:    BUN 20/Creatinine 1.16/GFR 49 - stable Albumin 3.0 Normal CBC  Family Communication: Daughter was present throughout evaluation  Disposition Plan:  Level of care: Downgrade to Med Surg Status is: Observation Remains hospitalized because:  needs neurosurgery consult,  diet advancement and general surgery sign off before ready for discharge home    Time spent: 55 minutes  Author: Karmen Bongo, MD 07/06/2022 1:21 PM  For on call review [email or web address].

## 2022-07-06 NOTE — Progress Notes (Addendum)
Progress Note   Subjective  Chief Complaint: Question choledocholithiasis, back pain  Today, the patient remains with back pain.  She really has less abdominal pain today if any at all.  Her daughter is concerned if we are not going to do procedures today and we do not think that is truly the source of her back pain then someone should be looking at the cause for her back pain and helping her to solve this.   Objective   Vital signs in last 24 hours: Temp:  [97.8 F (36.6 C)-98.6 F (37 C)] 97.8 F (36.6 C) (02/23 0833) Pulse Rate:  [84-119] 89 (02/23 0833) Resp:  [16-20] 18 (02/23 0833) BP: (116-146)/(68-93) 146/81 (02/23 0833) SpO2:  [97 %-100 %] 98 % (02/23 0833) Weight:  [62 kg] 62 kg (02/22 1443) Last BM Date : 07/05/22 General:    Elderly white female in NAD Heart:  Regular rate and rhythm; no murmurs Lungs: Respirations even and unlabored, lungs CTA bilaterally Abdomen:  Soft, mild generalized TTP to light palpation and nondistended. Normal bowel sounds. Psych:  Cooperative. Normal mood and affect.   Lab Results: Recent Labs    07/05/22 0609 07/06/22 0418  WBC 11.8* 9.5  HGB 14.6 13.1  HCT 43.9 39.4  PLT 404* 307   BMET Recent Labs    07/05/22 0609 07/06/22 0558  NA 139 136  K 4.3 4.5  CL 105 106  CO2 22 21*  GLUCOSE 110* 99  BUN 31* 20  CREATININE 1.37* 1.16*  CALCIUM 9.6 8.6*   LFT Recent Labs    07/05/22 0609 07/06/22 0558  PROT 7.2 5.8*  ALBUMIN 3.7 3.0*  AST 29 28  ALT 22 25  ALKPHOS 77 76  BILITOT 0.7 0.8  BILIDIR 0.1  --   IBILI 0.6  --    Studies/Results: MR ABDOMEN MRCP W WO CONTAST  Result Date: 07/05/2022 CLINICAL DATA:  Right-sided abdominal pain for 3 weeks. Biliary ductal dilatation on recent ultrasound. EXAM: MRI ABDOMEN WITHOUT AND WITH CONTRAST (INCLUDING MRCP) TECHNIQUE: Multiplanar multisequence MR imaging of the abdomen was performed both before and after the administration of intravenous contrast. Heavily T2-weighted  images of the biliary and pancreatic ducts were obtained, and three-dimensional MRCP images were rendered by post processing. CONTRAST:  52m GADAVIST GADOBUTROL 1 MMOL/ML IV SOLN COMPARISON:  Ultrasound and noncontrast CT on 07/05/2022 FINDINGS: Lower chest: No acute findings. Hepatobiliary: No hepatic masses identified. A few gallstones are noted, largest measuring approximately 1 cm. No evidence of cholecystitis. Diffuse biliary ductal dilatation is seen with common bile duct measuring 17 mm in diameter. A few tiny calculi are seen in the distal common bile duct, largest measuring approximately 5 mm. Pancreas: No mass or acute inflammatory changes. Diffuse irregular appearance of the pancreatic duct is noted, without evidence of pancreatic ductal dilatation or pancreas divisum. This is consistent with chronic pancreatitis. No peripancreatic fluid collections are seen. Spleen:  Within normal limits in size and appearance. Adrenals/Urinary Tract: No suspicious masses identified. No evidence of hydronephrosis. Stomach/Bowel: Moderate size hiatal hernia is seen. Probable left colonic diverticulosis, however there is no evidence of diverticulitis in this region. Vascular/Lymphatic: No pathologically enlarged lymph nodes identified. No acute vascular findings. Other:  None. Musculoskeletal:  No suspicious bone lesions identified. IMPRESSION: Cholelithiasis. No radiographic evidence of cholecystitis. Diffuse biliary ductal dilatation, with a few tiny calculi in the distal common bile duct measuring up to 5 mm. Findings of chronic pancreatitis. No radiographic signs of acute pancreatitis. Moderate  hiatal hernia. Electronically Signed   By: Marlaine Hind M.D.   On: 07/05/2022 14:43   US Abdomen Limited RUQ (LIVER/GB)  Result Date: 07/05/2022 CLINICAL DATA:  Right upper quadrant pain. EXAM: ULTRASOUND ABDOMEN LIMITED RIGHT UPPER QUADRANT COMPARISON:  Same day CT abdomen/pelvis FINDINGS: Gallbladder: The gallbladder is  distended but otherwise unremarkable. There are no shadowing stones. There is no gallbladder wall thickening or pericholecystic fluid. Sonographic Murphy's sign was reported negative. Common bile duct: Diameter: 8 mm, at the upper limits of normal for size given age. Liver: No focal lesion identified. Within normal limits in parenchymal echogenicity. Portal vein is patent on color Doppler imaging with normal direction of blood flow towards the liver. Other: None. IMPRESSION: 1. Distended but otherwise unremarkable gallbladder with no shadowing stone or evidence of acute cholecystitis. 2. Borderline dilated common bile duct measuring up to 8 mm. No obstructing lesion is seen. Correlate with laboratory values, and if there is high clinical concern for biliary obstruction, MRCP may be considered. Electronically Signed   By: Valetta Mole M.D.   On: 07/05/2022 09:01   CT Renal Stone Study  Result Date: 07/05/2022 CLINICAL DATA:  77 year old female presenting with history of right-sided flank and abdominal pain. EXAM: CT ABDOMEN AND PELVIS WITHOUT CONTRAST CT LUMBAR SPINE WITHOUT CONTRAST TECHNIQUE: Multidetector CT imaging of the abdomen and pelvis was performed following the standard protocol without IV contrast. Dedicated multiplanar reconstructions through the lumbar spine were also generated for interpretation purposes. RADIATION DOSE REDUCTION: This exam was performed according to the departmental dose-optimization program which includes automated exposure control, adjustment of the mA and/or kV according to patient size and/or use of iterative reconstruction technique. COMPARISON:  No priors. FINDINGS: Lower chest: Scattered areas of mild cylindrical bronchiectasis and what appears to be areas of mild chronic post infectious or inflammatory scarring noted in the lung bases bilaterally. Atherosclerotic calcifications in the descending thoracic aorta as well as the right coronary artery. Moderate to large hiatal  hernia. Hepatobiliary: No definite suspicious cystic or solid hepatic lesions are confidently identified on today's noncontrast CT examination. Gallbladder is moderately distended. Slight heterogeneous attenuation within the lumen of the gallbladder noted, which could suggest the presence of multiple noncalcified gallstones. Gallbladder wall does not appear thickened. No pericholecystic fluid or surrounding inflammatory changes. Common bile duct appears dilated estimated to measure proximally 13 mm in the porta hepatis. Small 5 mm calcification noted in the region of the distal common bile duct, concerning for choledocholithiasis (axial image 30 of series 1). There may be some mild intrahepatic biliary ductal dilatation, difficult to judge on today's noncontrast CT examination. Pancreas: No pancreatic mass. No pancreatic ductal dilatation. No pancreatic or peripancreatic fluid collections or inflammatory changes. Spleen: Unremarkable. Adrenals/Urinary Tract: No calcifications are identified within the collecting system of either kidney, along the course of either ureter, or within the lumen of the urinary bladder. No hydroureteronephrosis. There are several right-sided renal lesions, incompletely characterized on today's noncontrast CT examination, most concerning of which in the upper pole measures 1.2 cm in diameter and is 42 HU. Unenhanced appearance of the left kidney is unremarkable. Urinary bladder is nearly completely decompressed, but otherwise unremarkable in appearance. Stomach/Bowel: The appearance of the intra-abdominal portion of the stomach is unremarkable. Periampullary duodenal diverticulum without surrounding inflammatory changes to suggest an acute diverticulitis at this time. No pathologic dilatation of small bowel or colon. Numerous colonic diverticula are noted, without surrounding inflammatory changes to indicate an acute diverticulitis at this time. The appendix  is not confidently identified  and may be surgically absent. Regardless, there are no inflammatory changes noted adjacent to the cecum to suggest the presence of an acute appendicitis at this time. Vascular/Lymphatic: Atherosclerosis in the abdominal aorta and pelvic vasculature. No lymphadenopathy noted in the abdomen or pelvis. Reproductive: Uterus and ovaries are atrophic. Other: No significant volume of ascites.  No pneumoperitoneum. Musculoskeletal: Chronic appearing compression fracture of L2 superior endplate with [id number] loss of anterior vertebral body height. There is also some compression of the inferior endplate of L4 vertebral body most evident on the left side where there is up to 50% loss of vertebral body height. Advanced degenerative disc disease noted at L4-L5 with extensive discogenic changes. There are no aggressive appearing lytic or blastic lesions noted in the visualized portions of the skeleton. IMPRESSION: 1. No urinary tract calculi or findings of urinary tract obstruction. 2. Findings are concerning for probable obstructive choledocholithiasis. Further evaluation with abdominal MRI with and without IV gadolinium with MRCP is recommended at this time if there is any clinical or biochemical evidence concerning for biliary tract obstruction. There also appear to be multiple noncalcified gallstones filling the lumen of the gallbladder (difficult to say for certain on today's CT examination), with moderate distension of the gallbladder, but no overt imaging findings to suggest an acute cholecystitis at this time. 3. Moderate to large hiatal hernia. 4. Colonic diverticulosis without evidence of acute diverticulitis at this time. 5. Bronchiectasis and mild scarring in the lung bases bilaterally. 6. Additional incidental findings, as above. Electronically Signed   By: Vinnie Langton M.D.   On: 07/05/2022 07:13   CT L-SPINE NO CHARGE  Result Date: 07/05/2022 CLINICAL DATA:  77 year old female presenting with history of  right-sided flank and abdominal pain. EXAM: CT ABDOMEN AND PELVIS WITHOUT CONTRAST CT LUMBAR SPINE WITHOUT CONTRAST TECHNIQUE: Multidetector CT imaging of the abdomen and pelvis was performed following the standard protocol without IV contrast. Dedicated multiplanar reconstructions through the lumbar spine were also generated for interpretation purposes. RADIATION DOSE REDUCTION: This exam was performed according to the departmental dose-optimization program which includes automated exposure control, adjustment of the mA and/or kV according to patient size and/or use of iterative reconstruction technique. COMPARISON:  No priors. FINDINGS: Lower chest: Scattered areas of mild cylindrical bronchiectasis and what appears to be areas of mild chronic post infectious or inflammatory scarring noted in the lung bases bilaterally. Atherosclerotic calcifications in the descending thoracic aorta as well as the right coronary artery. Moderate to large hiatal hernia. Hepatobiliary: No definite suspicious cystic or solid hepatic lesions are confidently identified on today's noncontrast CT examination. Gallbladder is moderately distended. Slight heterogeneous attenuation within the lumen of the gallbladder noted, which could suggest the presence of multiple noncalcified gallstones. Gallbladder wall does not appear thickened. No pericholecystic fluid or surrounding inflammatory changes. Common bile duct appears dilated estimated to measure proximally 13 mm in the porta hepatis. Small 5 mm calcification noted in the region of the distal common bile duct, concerning for choledocholithiasis (axial image 30 of series 1). There may be some mild intrahepatic biliary ductal dilatation, difficult to judge on today's noncontrast CT examination. Pancreas: No pancreatic mass. No pancreatic ductal dilatation. No pancreatic or peripancreatic fluid collections or inflammatory changes. Spleen: Unremarkable. Adrenals/Urinary Tract: No  calcifications are identified within the collecting system of either kidney, along the course of either ureter, or within the lumen of the urinary bladder. No hydroureteronephrosis. There are several right-sided renal lesions, incompletely characterized on today's noncontrast  CT examination, most concerning of which in the upper pole measures 1.2 cm in diameter and is 42 HU. Unenhanced appearance of the left kidney is unremarkable. Urinary bladder is nearly completely decompressed, but otherwise unremarkable in appearance. Stomach/Bowel: The appearance of the intra-abdominal portion of the stomach is unremarkable. Periampullary duodenal diverticulum without surrounding inflammatory changes to suggest an acute diverticulitis at this time. No pathologic dilatation of small bowel or colon. Numerous colonic diverticula are noted, without surrounding inflammatory changes to indicate an acute diverticulitis at this time. The appendix is not confidently identified and may be surgically absent. Regardless, there are no inflammatory changes noted adjacent to the cecum to suggest the presence of an acute appendicitis at this time. Vascular/Lymphatic: Atherosclerosis in the abdominal aorta and pelvic vasculature. No lymphadenopathy noted in the abdomen or pelvis. Reproductive: Uterus and ovaries are atrophic. Other: No significant volume of ascites.  No pneumoperitoneum. Musculoskeletal: Chronic appearing compression fracture of L2 superior endplate with [id number] loss of anterior vertebral body height. There is also some compression of the inferior endplate of L4 vertebral body most evident on the left side where there is up to 50% loss of vertebral body height. Advanced degenerative disc disease noted at L4-L5 with extensive discogenic changes. There are no aggressive appearing lytic or blastic lesions noted in the visualized portions of the skeleton. IMPRESSION: 1. No urinary tract calculi or findings of urinary tract  obstruction. 2. Findings are concerning for probable obstructive choledocholithiasis. Further evaluation with abdominal MRI with and without IV gadolinium with MRCP is recommended at this time if there is any clinical or biochemical evidence concerning for biliary tract obstruction. There also appear to be multiple noncalcified gallstones filling the lumen of the gallbladder (difficult to say for certain on today's CT examination), with moderate distension of the gallbladder, but no overt imaging findings to suggest an acute cholecystitis at this time. 3. Moderate to large hiatal hernia. 4. Colonic diverticulosis without evidence of acute diverticulitis at this time. 5. Bronchiectasis and mild scarring in the lung bases bilaterally. 6. Additional incidental findings, as above. Electronically Signed   By: Vinnie Langton M.D.   On: 07/05/2022 07:13     Assessment / Plan:   Assessment: 1.  Back pain: Thought related to L2 compression/fracture and history of herniated lumbar this 2.  Choledocholithiasis: Back pain radiating over to the right side, worsening over the past 3 weeks, MRCP with 5 mm bile duct and tiny stones in the CBD, lipase only mildly elevated 78 admission, LFTs remain unremarkable, no abdominal pain, nausea or vomiting; concern for choledocholithiasis but also abnormality in the bile duct such as ampullary cancer 3.  Cholelithiasis  Plan: 1.  Again, I do not feel like her gallbladder is the root of her primary complaint here.  Mostly she is dealing with back pain which sounds musculoskeletal given that it is worse with movement/trying to sit up.  Would recommend consulting someone to help with this.  Either that or help her manage pain meds to improve this symptom. 2.  Unfortunately patient cannot be scheduled for EUS today due to endoscopic availability.  She will need this further evaluated as an outpatient as I discussed with family today.  (There was a lot of back-and-forth today  unfortunately and this procedure was discussed with the family, but due to staffing issues we are unable to accommodate the case).  Patient will be scheduled for direct EUS/ERCP as an outpatient with [place] in the near future.  She would called  from our nursing staff regarding this. 3.  Patient can have regular diet today from a GI standpoint. 4.  Continue to monitor LFTs 5.  Continue supportive measures including analgesics  Thank you for your kind consultation, we will sign off.  Please let us know if any further GI concerns arise during patient's hospitalization.   LOS: 0 days   Levin Erp  07/06/2022, 10:52 AM

## 2022-07-07 DIAGNOSIS — M1990 Unspecified osteoarthritis, unspecified site: Secondary | ICD-10-CM | POA: Diagnosis present

## 2022-07-07 DIAGNOSIS — G8929 Other chronic pain: Secondary | ICD-10-CM | POA: Diagnosis present

## 2022-07-07 DIAGNOSIS — K449 Diaphragmatic hernia without obstruction or gangrene: Secondary | ICD-10-CM | POA: Diagnosis present

## 2022-07-07 DIAGNOSIS — K219 Gastro-esophageal reflux disease without esophagitis: Secondary | ICD-10-CM | POA: Diagnosis present

## 2022-07-07 DIAGNOSIS — K861 Other chronic pancreatitis: Secondary | ICD-10-CM | POA: Diagnosis present

## 2022-07-07 DIAGNOSIS — M4856XG Collapsed vertebra, not elsewhere classified, lumbar region, subsequent encounter for fracture with delayed healing: Secondary | ICD-10-CM | POA: Diagnosis present

## 2022-07-07 DIAGNOSIS — N1831 Chronic kidney disease, stage 3a: Secondary | ICD-10-CM | POA: Diagnosis present

## 2022-07-07 DIAGNOSIS — Z79899 Other long term (current) drug therapy: Secondary | ICD-10-CM | POA: Diagnosis not present

## 2022-07-07 DIAGNOSIS — K8020 Calculus of gallbladder without cholecystitis without obstruction: Secondary | ICD-10-CM | POA: Diagnosis not present

## 2022-07-07 DIAGNOSIS — Z7983 Long term (current) use of bisphosphonates: Secondary | ICD-10-CM | POA: Diagnosis not present

## 2022-07-07 DIAGNOSIS — E7800 Pure hypercholesterolemia, unspecified: Secondary | ICD-10-CM | POA: Diagnosis present

## 2022-07-07 DIAGNOSIS — K8050 Calculus of bile duct without cholangitis or cholecystitis without obstruction: Secondary | ICD-10-CM | POA: Diagnosis not present

## 2022-07-07 DIAGNOSIS — Z823 Family history of stroke: Secondary | ICD-10-CM | POA: Diagnosis not present

## 2022-07-07 DIAGNOSIS — Z888 Allergy status to other drugs, medicaments and biological substances status: Secondary | ICD-10-CM | POA: Diagnosis not present

## 2022-07-07 DIAGNOSIS — Z833 Family history of diabetes mellitus: Secondary | ICD-10-CM | POA: Diagnosis not present

## 2022-07-07 DIAGNOSIS — I129 Hypertensive chronic kidney disease with stage 1 through stage 4 chronic kidney disease, or unspecified chronic kidney disease: Secondary | ICD-10-CM | POA: Diagnosis present

## 2022-07-07 DIAGNOSIS — Z87440 Personal history of urinary (tract) infections: Secondary | ICD-10-CM | POA: Diagnosis not present

## 2022-07-07 DIAGNOSIS — D72828 Other elevated white blood cell count: Secondary | ICD-10-CM | POA: Diagnosis present

## 2022-07-07 DIAGNOSIS — K8070 Calculus of gallbladder and bile duct without cholecystitis without obstruction: Secondary | ICD-10-CM | POA: Diagnosis present

## 2022-07-07 DIAGNOSIS — Z8249 Family history of ischemic heart disease and other diseases of the circulatory system: Secondary | ICD-10-CM | POA: Diagnosis not present

## 2022-07-07 DIAGNOSIS — K5909 Other constipation: Secondary | ICD-10-CM | POA: Diagnosis present

## 2022-07-07 DIAGNOSIS — N179 Acute kidney failure, unspecified: Secondary | ICD-10-CM | POA: Diagnosis present

## 2022-07-07 DIAGNOSIS — Z7982 Long term (current) use of aspirin: Secondary | ICD-10-CM | POA: Diagnosis not present

## 2022-07-07 DIAGNOSIS — K828 Other specified diseases of gallbladder: Secondary | ICD-10-CM | POA: Diagnosis present

## 2022-07-07 LAB — BASIC METABOLIC PANEL
Anion gap: 11 (ref 5–15)
BUN: 17 mg/dL (ref 8–23)
CO2: 21 mmol/L — ABNORMAL LOW (ref 22–32)
Calcium: 8.5 mg/dL — ABNORMAL LOW (ref 8.9–10.3)
Chloride: 106 mmol/L (ref 98–111)
Creatinine, Ser: 0.91 mg/dL (ref 0.44–1.00)
GFR, Estimated: >60 mL/min (ref >60)
Glucose, Bld: 106 mg/dL — ABNORMAL HIGH (ref 70–99)
Potassium: 4.4 mmol/L (ref 3.5–5.1)
Sodium: 138 mmol/L (ref 135–145)

## 2022-07-07 MED ORDER — CALCITONIN (SALMON) 200 UNIT/ACT NA SOLN: 1.0000 | Freq: Every day | NASAL | 12 refills | Status: DC

## 2022-07-07 MED ORDER — HYDROCODONE-ACETAMINOPHEN 5-325 MG PO TABS: 1.0000 | ORAL_TABLET | Freq: Four times a day (QID) | ORAL | 0 refills | Status: DC | PRN

## 2022-07-07 MED ORDER — CALCITONIN (SALMON) 200 UNIT/ACT NA SOLN
1.0000 | Freq: Every day | NASAL | Status: DC
  Administered 2022-07-07: 1 via NASAL
  Filled 2022-07-07: qty 3.7

## 2022-07-07 NOTE — Progress Notes (Signed)
Subjective/Chief Complaint: Back pain is her main complaint. No abdominal pain or nausea. Tolerating a diet.  Does not wish to have surgery this hospitalization    Objective: Vital signs in last 24 hours: Temp:  [97.5 F (36.4 C)-98.3 F (36.8 C)] 98.3 F (36.8 C) (02/24 0819) Pulse Rate:  [89-105] 89 (02/24 0819) Resp:  [16-20] 16 (02/24 0819) BP: (104-146)/(65-84) 104/65 (02/24 0819) SpO2:  [97 %-98 %] 97 % (02/24 0819) Last BM Date : 07/05/22  Intake/Output from previous day: 02/23 0701 - 02/24 0700 In: 240 [P.O.:240] Out: -  Intake/Output this shift: No intake/output data recorded.  A&Ox3 Unlabored respirations Abd s/nt/nd  Lab Results:  Recent Labs    07/05/22 0609 07/06/22 0418  WBC 11.8* 9.5  HGB 14.6 13.1  HCT 43.9 39.4  PLT 404* 307    BMET Recent Labs    07/06/22 0558 07/07/22 0304  NA 136 138  K 4.5 4.4  CL 106 106  CO2 21* 21*  GLUCOSE 99 106*  BUN 20 17  CREATININE 1.16* 0.91  CALCIUM 8.6* 8.5*    PT/INR No results for input(s): "LABPROT", "INR" in the last 72 hours. ABG No results for input(s): "PHART", "HCO3" in the last 72 hours.  Invalid input(s): "PCO2", "PO2"  Studies/Results: MR ABDOMEN MRCP W WO CONTAST  Result Date: 07/05/2022 CLINICAL DATA:  Right-sided abdominal pain for 3 weeks. Biliary ductal dilatation on recent ultrasound. EXAM: MRI ABDOMEN WITHOUT AND WITH CONTRAST (INCLUDING MRCP) TECHNIQUE: Multiplanar multisequence MR imaging of the abdomen was performed both before and after the administration of intravenous contrast. Heavily T2-weighted images of the biliary and pancreatic ducts were obtained, and three-dimensional MRCP images were rendered by post processing. CONTRAST:  36m GADAVIST GADOBUTROL 1 MMOL/ML IV SOLN COMPARISON:  Ultrasound and noncontrast CT on 07/05/2022 FINDINGS: Lower chest: No acute findings. Hepatobiliary: No hepatic masses identified. A few gallstones are noted, largest measuring approximately 1  cm. No evidence of cholecystitis. Diffuse biliary ductal dilatation is seen with common bile duct measuring 17 mm in diameter. A few tiny calculi are seen in the distal common bile duct, largest measuring approximately 5 mm. Pancreas: No mass or acute inflammatory changes. Diffuse irregular appearance of the pancreatic duct is noted, without evidence of pancreatic ductal dilatation or pancreas divisum. This is consistent with chronic pancreatitis. No peripancreatic fluid collections are seen. Spleen:  Within normal limits in size and appearance. Adrenals/Urinary Tract: No suspicious masses identified. No evidence of hydronephrosis. Stomach/Bowel: Moderate size hiatal hernia is seen. Probable left colonic diverticulosis, however there is no evidence of diverticulitis in this region. Vascular/Lymphatic: No pathologically enlarged lymph nodes identified. No acute vascular findings. Other:  None. Musculoskeletal:  No suspicious bone lesions identified. IMPRESSION: Cholelithiasis. No radiographic evidence of cholecystitis. Diffuse biliary ductal dilatation, with a few tiny calculi in the distal common bile duct measuring up to 5 mm. Findings of chronic pancreatitis. No radiographic signs of acute pancreatitis. Moderate hiatal hernia. Electronically Signed   By: JMarlaine HindM.D.   On: 07/05/2022 14:43    Anti-infectives: Anti-infectives (From admission, onward)    Start     Dose/Rate Route Frequency Ordered Stop   07/05/22 2030  ampicillin-sulbactam (UNASYN) 1.5 g in sodium chloride 0.9 % 100 mL IVPB        1.5 g 200 mL/hr over 30 Minutes Intravenous  Once 07/05/22 1933 07/05/22 2310       Assessment/Plan: Back pain Possible choledocholithiasis - 3 weeks of progressive back pain, worse with movement -  CT questions choledocholithiasis - RUQ [place] with no signs of cholecystitis, no shadowing stones and a borderline dilated common bile duct at 59m -MRCP+ for cholelithiasis and choledocholithiasis - WBC and  LFTs normal, lipase minimally elevated - GI plans for ERCP +/- EUS now as an outpatient. Will be available for f/u in the office as needed depending on results of ERCP  - Suspect her back pain is more related to msk etiology- defer to primary team   FEN: low fat diet VTE: ok for chemical dvt ppx from surgical standpoint ID: no need for abx at this time from our standpoint   Will sign off as patient does not wish to have surgery at this time and has no signs of cholecystitis or biliary obstruction.  - below per primary - Chronic L2 compression fx  HTN HLD GERD Osteoporosis/arthritis  Hx of Herniated lumbar disc   I reviewed ED provider notes, last 24 h vitals and pain scores, last 48 h intake and output, last 24 h labs and trends, and last 24 h imaging results.  LOS: 0 days    ARosario [id number]

## 2022-07-07 NOTE — Telephone Encounter (Signed)
Yes. OK. GM

## 2022-07-07 NOTE — Progress Notes (Signed)
Mobility Specialist - Progress Note   07/07/22 1400  Mobility  Activity Ambulated with assistance in hallway  Level of Assistance Standby assist, set-up cues, supervision of patient - no hands on  Assistive Device Cane  Distance Ambulated (ft) 80 ft  Activity Response Tolerated well  Mobility Referral Yes  $Mobility charge 1 Mobility    Pt received in recliner agreeable to mobility. No complaints throughout, tolerated increased distance well. Left in recliner w/ all needs met.   [place] Specialist Please contact via SecureChat or Rehab office at [phone]

## 2022-07-07 NOTE — Evaluation (Signed)
Physical Therapy Evaluation Patient Details Name: Cheryl Lee MRN: [id number] DOB: December 08, 1945 Today's Date: 07/07/2022  History of Present Illness  Pt is a 77 y.o. female admitted who presented to ED 2/22 with c/o back pain. Abdominal MRI revealed cholelithiasis and chronic pancreatitis. Xray revealed L2 compression fx (new since 2018). Pt declining sx. Plan is to manage nonoperatively. PMH:  hypertension, hyperlipidemia, osteoporosis, GERD, h/o L4/5 lami (2018)   Clinical Impression  Pt admitted with above diagnosis. PTA pt lived at home alone, mod I with QC.  Pt currently with functional limitations due to the deficits listed below (see PT Problem List). On eval, pt demo mod I bed mobility. Supervision provided for transfers and amb with QC. Steady gait. Balance WFL. Educated pt on back precautions for comfort, brace don/doff, and brace wear schedule. Pt will benefit from skilled PT to increase their independence and safety with mobility to allow discharge home. PT to follow acutely. No follow up PT services initially. Recommend OPPT after compression fx healing time, as determined by neuro at follow-up appt.           Recommendations for follow up therapy are one component of a multi-disciplinary discharge planning process, led by the attending physician.  Recommendations may be updated based on patient status, additional functional criteria and insurance authorization.  Follow Up Recommendations No PT follow up      Assistance Recommended at Discharge PRN  Patient can return home with the following       Equipment Recommendations None recommended by PT  Recommendations for Other Services       Functional Status Assessment Patient has had a recent decline in their functional status and demonstrates the ability to make significant improvements in function in a reasonable and predictable amount of time.     Precautions / Restrictions Precautions Precautions: Back Precaution  Comments: Educated on 3/3 back precautions for comfort. Required Braces or Orthoses: Spinal Brace Spinal Brace: Lumbar corset;Applied in standing position      Mobility  Bed Mobility Overal bed mobility: Modified Independent                  Transfers Overall transfer level: Needs assistance Equipment used: Ambulation equipment used Transfers: Sit to/from Stand Sit to Stand: Supervision                Ambulation/Gait Ambulation/Gait assistance: Supervision Gait Distance (Feet): 50 Feet Assistive device: Quad cane Gait Pattern/deviations: Step-through pattern, Decreased stride length       General Gait Details: steady gait with QC  Stairs            Wheelchair Mobility    Modified Rankin (Stroke Patients Only)       Balance Overall balance assessment: Mild deficits observed, not formally tested                                           Pertinent Vitals/Pain Pain Assessment Pain Assessment: Faces Faces Pain Scale: Hurts little more Pain Location: low back Pain Descriptors / Indicators: Discomfort, Grimacing Pain Intervention(s): Monitored during session, Repositioned    Home Living Family/patient expects to be discharged to:: Private residence Living Arrangements: Alone Available Help at Discharge: Family;Available PRN/intermittently (family lives next door and daughter checks on frequently) Type of Home: House Home Access: Stairs to enter Entrance Stairs-Rails: Left Entrance Stairs-Number of Steps: 1 onto porch then threshold Alternate  Level Stairs-Number of Steps: Pt walks around through yard to access basement through level outdoor entrance. Home Layout: Multi-level;Able to live on main level with bedroom/bathroom;Laundry or work area in [place]: [place] (2 wheels);Hand held shower head      Prior Function Prior Level of Function : Independent/Modified Independent;Driving                      Hand Dominance        Extremity/Trunk Assessment   Upper Extremity Assessment Upper Extremity Assessment: Overall WFL for tasks assessed    Lower Extremity Assessment Lower Extremity Assessment: Overall WFL for tasks assessed    Cervical / Trunk Assessment Cervical / Trunk Assessment: Kyphotic;Other exceptions Cervical / Trunk Exceptions: scoliosis  Communication   Communication: No difficulties  Cognition Arousal/Alertness: Awake/alert Behavior During Therapy: WFL for tasks assessed/performed Overall Cognitive Status: Within Functional Limits for tasks assessed                                          General Comments General comments (skin integrity, edema, etc.): Educated pt on don/doff lumbar corset.    Exercises     Assessment/Plan    PT Assessment Patient needs continued PT services  PT Problem List Decreased knowledge of precautions;Pain;Decreased activity tolerance;Decreased mobility       PT Treatment Interventions Functional mobility training;Gait training;Therapeutic activities;Stair training;Therapeutic exercise;Patient/family education    PT Goals (Current goals can be found in the Care Plan section)  Acute Rehab PT Goals Patient Stated Goal: decrease pain PT Goal Formulation: With patient/family Time For Goal Achievement: 07/14/22 Potential to Achieve Goals: Good    Frequency Min 3X/week     Co-evaluation               AM-PAC PT "6 Clicks" Mobility  Outcome Measure Help needed turning from your back to your side while in a flat bed without using bedrails?: None Help needed moving from lying on your back to sitting on the side of a flat bed without using bedrails?: None Help needed moving to and from a bed to a chair (including a wheelchair)?: A Little Help needed standing up from a chair using your arms (e.g., wheelchair or bedside chair)?: A Little Help needed to walk in hospital room?: A Little Help  needed climbing 3-5 steps with a railing? : A Little 6 Click Score: 20    End of Session Equipment Utilized During Treatment: Back brace Activity Tolerance: Patient tolerated treatment well Patient left: in chair;with call bell/phone within reach;with family/visitor present Nurse Communication: Mobility status PT Visit Diagnosis: Difficulty in walking, not elsewhere classified (R26.2);Pain    Time: 1139-1203 PT Time Calculation (min) (ACUTE ONLY): 24 min   Charges:   PT Evaluation $PT Eval Moderate Complexity: 1 Mod PT Treatments $Gait Training: 8-22 mins        Gloriann Loan., PT  Office # [phone]   Lorriane Shire 07/07/2022, 12:51 PM

## 2022-07-07 NOTE — Progress Notes (Signed)
This RN informed Surgery about patient's Daughter and daughter decision about declination of procedure and want to change the NPO order. Dr. Georganna Skeans replied to keep patient NPO for possible cholecystectomy. Paatient and patient's daughter opt out for cholecystectomy. Dr Olivia Mackie made aware. Stated, ok, will follow eith patient in AM.  NPO order still active

## 2022-07-07 NOTE — Hospital Course (Signed)
Patient with h/o HTN, HLD who presented with back pain x 3 weeks. CT showed concern for obstructive choledocholithiasis. Despite this, her primary concern appears to be back pain.  Surgery and Gi consulted.  Unable to do ERCP/EUS during hospitalization; will need outpatient GI f/u.  Patient also declined cholecystectomy with cholangiogram, can f/u outpatient with surgery. Ongoing back pain.  Neurosurgery consulted, recommends conservative management with LSO corset and symptom management, PT evaluation, f/u in 4-6 weeks.

## 2022-07-07 NOTE — Discharge Summary (Signed)
Physician Discharge Summary   Patient: Cheryl Lee MRN: [id number] DOB: 1946-05-05  Admit date:     07/05/2022  Discharge date: 07/07/22  Discharge Physician: Karmen Bongo   PCP: Sharion Balloon, FNP   Recommendations at discharge:   Follow up with GI for possible endoscopic ultrasound (EUS)/ERCP Follow up with surgery for possible cholecystectomy Follow up with PCP this week Request outpatient PT through PCP Follow up with Dr. Saintclair Halsted in 4-6 weeks regarding back pain  Discharge Diagnoses: Principal Problem:   Choledocholithiasis Active Problems:   Lumbar compression fracture ([place])   Stage 3a chronic kidney disease (CKD) ([place])   Hypertension   Hyperlipidemia with target LDL less than 100   GERD (gastroesophageal reflux disease)    Hospital Course: Patient with h/o HTN, HLD who presented with back pain x 3 weeks. CT showed concern for obstructive choledocholithiasis. Despite this, her primary concern appears to be back pain.  Surgery and Gi consulted.  Unable to do ERCP/EUS during hospitalization; will need outpatient GI f/u.  Patient also declined cholecystectomy with cholangiogram, can f/u outpatient with surgery. Ongoing back pain.  Neurosurgery consulted, recommends conservative management with LSO corset and symptom management, PT evaluation, f/u in 4-6 weeks.  Assessment and Plan: No notes have been filed under this hospital service. Service: Hospitalist   Back pain -Chronic L2 compression fracture - diagnosed on 2/17 at urgent care with no improvement after Decadron injection, lidocaine patch -She believes this is the primary source of her discomfort -Neurosurgery consulted -She may need MRI for further evaluation vs. IR consult for kyphoplasty as an outpatient but neurosurgery currently recommends conservative treatment for 4-6 weeks and outpatient follow up -Pain control with Norco, flexeril, prednisone, and lidocaine patch  -Will add calcitonin NS -PT  evaluation, will need to ask for outpatient referral through PCP  Choledocholithiasis -Appears to be an incidental finding -Also with chronic pancreatitis on imaging -She as initially scheduled for ERCP but there is no room on the schedule and this will be deferred to the outpatient setting -Surgery and GI consulted -Surgery recommended lap chole with intra-operative cholangiogram - patient declined and will f/u outpatient -She was given 1 dose of Unasyn    HTN -Continue Zestoretic   HLD -Continue atorvastatin   GERD -Continue Prilosec   Stage 3a CKD -Mild AKI on presentation, improved -Normalized today  Pain control - [place] Controlled Substance Reporting System database was reviewed. and patient was instructed, not to drive, operate heavy machinery, perform activities at heights, swimming or participation in water activities or provide baby-sitting services while on Pain, Sleep and Anxiety Medications; until their outpatient Physician has advised to do so again. Also recommended to not to take more than prescribed Pain, Sleep and Anxiety Medications.  Consultants: GI, surgery, neurosurgery, PT Procedures performed: None  Disposition: Home Diet recommendation:  Regular diet DISCHARGE MEDICATION: Allergies as of 07/07/2022       Reactions   Lycopene Itching, Rash        Medication List     TAKE these medications    acetaminophen 650 MG CR tablet Commonly known as: TYLENOL Take 1,300 mg by mouth every 8 (eight) hours as needed for pain.   alendronate 70 MG tablet Commonly known as: FOSAMAX Take 1 tablet (70 mg total) by mouth every Monday. Take with a full glass of water on an empty stomach.   aspirin 81 MG tablet Take 1 tablet (81 mg total) by mouth daily.   atorvastatin 10 MG tablet Commonly  known as: LIPITOR Take 1 tablet (10 mg total) by mouth daily.   calcitonin (salmon) 200 UNIT/ACT nasal spray Commonly known as: MIACALCIN/FORTICAL Place 1 spray  into alternate nostrils daily.   cyclobenzaprine 5 MG tablet Commonly known as: FLEXERIL Take 1 tablet (5 mg total) by mouth 2 (two) times daily as needed for up to 10 days for muscle spasms.   fluticasone 50 MCG/ACT nasal spray Commonly known as: FLONASE Place 2 sprays into both nostrils daily.   HYDROcodone-acetaminophen 5-325 MG tablet Commonly known as: NORCO/VICODIN Take 1 tablet by mouth every 6 (six) hours as needed for moderate pain.   lidocaine 4 % Commonly known as: HM Lidocaine Patch Place 1 patch onto the skin daily.   lisinopril-hydrochlorothiazide 20-12.5 MG tablet Commonly known as: ZESTORETIC Take 0.5 tablets by mouth daily.   multivitamins ther. w/minerals Tabs tablet Take 1 tablet by mouth daily.   omeprazole 20 MG capsule Commonly known as: PRILOSEC Take 1 capsule (20 mg total) by mouth daily.   predniSONE 10 MG tablet Commonly known as: DELTASONE Take 3 tablets (30 mg total) by mouth daily for 5 days.   Systane 0.4-0.3 % Soln Generic drug: Polyethyl Glycol-Propyl Glycol Place 1 drop into both eyes daily as needed (Dry eye).   trolamine salicylate 10 % cream Commonly known as: ASPERCREME Apply 1 Application topically daily as needed for muscle pain (cramps).   Vitamin C 500 MG Caps Take 500 mg by mouth daily.   Vitamin D (Ergocalciferol) 1.25 MG (50000 UNIT) Caps capsule Commonly known as: DRISDOL Take 1 capsule (50,000 Units total) by mouth every 7 (seven) days.        Follow-up Information     Sharion Balloon, FNP.   Specialty: Family Medicine Contact information: [place] [address] [place], [facility].   Specialty: Neurosurgery Contact information: [address] [place] [address] [phone]                 Subjective:    Ongoing back pain, possibly mildly improved.  She has been able to get up and around to the bathroom.  She  feels ready for dc today and declined surgery.   Discharge Exam: Filed Weights   07/05/22 0816 07/05/22 1100 07/05/22 1443  Weight: 62 kg 62 kg 62 kg   General:  Appears calm and comfortable and is in NAD, sitting in bedside chair, LSO brace appears too high Eyes:    normal lids, iris ENT:  grossly normal hearing, lips & tongue, mmm Neck:  no LAD, masses or thyromegaly Cardiovascular:  RRR, no m/r/g. No LE edema.  Respiratory:   CTA bilaterally with no wheezes/rales/rhonchi.  Normal respiratory effort. Abdomen:  soft, NT, ND Skin:  no rash or induration seen on limited exam Musculoskeletal:  grossly normal tone BUE/BLE, good ROM, no bony abnormality Psychiatric:  grossly normal mood and affect, speech fluent and appropriate, AOx3 Neurologic:  CN 2-12 grossly intact, moves all extremities in coordinated fashion   Radiological Exams on Admission: Independently reviewed - see discussion in A/P where applicable  No results found.  EKG: not done   Labs on Admission: I have personally reviewed the available labs and imaging studies at the time of the admission.  Pertinent labs:    Glucose 106  Condition at discharge: improving  The results of significant diagnostics from this hospitalization (including imaging, microbiology, ancillary and laboratory) are  listed below for reference.   Imaging Studies: MR ABDOMEN MRCP W WO CONTAST  Result Date: 07/05/2022 CLINICAL DATA:  Right-sided abdominal pain for 3 weeks. Biliary ductal dilatation on recent ultrasound. EXAM: MRI ABDOMEN WITHOUT AND WITH CONTRAST (INCLUDING MRCP) TECHNIQUE: Multiplanar multisequence MR imaging of the abdomen was performed both before and after the administration of intravenous contrast. Heavily T2-weighted images of the biliary and pancreatic ducts were obtained, and three-dimensional MRCP images were rendered by post processing. CONTRAST:  32m GADAVIST GADOBUTROL 1 MMOL/ML IV SOLN COMPARISON:  Ultrasound and  noncontrast CT on 07/05/2022 FINDINGS: Lower chest: No acute findings. Hepatobiliary: No hepatic masses identified. A few gallstones are noted, largest measuring approximately 1 cm. No evidence of cholecystitis. Diffuse biliary ductal dilatation is seen with common bile duct measuring 17 mm in diameter. A few tiny calculi are seen in the distal common bile duct, largest measuring approximately 5 mm. Pancreas: No mass or acute inflammatory changes. Diffuse irregular appearance of the pancreatic duct is noted, without evidence of pancreatic ductal dilatation or pancreas divisum. This is consistent with chronic pancreatitis. No peripancreatic fluid collections are seen. Spleen:  Within normal limits in size and appearance. Adrenals/Urinary Tract: No suspicious masses identified. No evidence of hydronephrosis. Stomach/Bowel: Moderate size hiatal hernia is seen. Probable left colonic diverticulosis, however there is no evidence of diverticulitis in this region. Vascular/Lymphatic: No pathologically enlarged lymph nodes identified. No acute vascular findings. Other:  None. Musculoskeletal:  No suspicious bone lesions identified. IMPRESSION: Cholelithiasis. No radiographic evidence of cholecystitis. Diffuse biliary ductal dilatation, with a few tiny calculi in the distal common bile duct measuring up to 5 mm. Findings of chronic pancreatitis. No radiographic signs of acute pancreatitis. Moderate hiatal hernia. Electronically Signed   By: JMarlaine HindM.D.   On: 07/05/2022 14:43   UK[place]Abdomen Limited RUQ (LIVER/GB)  Result Date: 07/05/2022 CLINICAL DATA:  Right upper quadrant pain. EXAM: ULTRASOUND ABDOMEN LIMITED RIGHT UPPER QUADRANT COMPARISON:  Same day CT abdomen/pelvis FINDINGS: Gallbladder: The gallbladder is distended but otherwise unremarkable. There are no shadowing stones. There is no gallbladder wall thickening or pericholecystic fluid. Sonographic Murphy's sign was reported negative. Common bile duct:  Diameter: 8 mm, at the upper limits of normal for size given age. Liver: No focal lesion identified. Within normal limits in parenchymal echogenicity. Portal vein is patent on color Doppler imaging with normal direction of blood flow towards the liver. Other: None. IMPRESSION: 1. Distended but otherwise unremarkable gallbladder with no shadowing stone or evidence of acute cholecystitis. 2. Borderline dilated common bile duct measuring up to 8 mm. No obstructing lesion is seen. Correlate with laboratory values, and if there is high clinical concern for biliary obstruction, MRCP may be considered. Electronically Signed   By: PValetta MoleM.D.   On: 07/05/2022 09:01   CT Renal Stone Study  Result Date: 07/05/2022 CLINICAL DATA:  77year old female presenting with history of right-sided flank and abdominal pain. EXAM: CT ABDOMEN AND PELVIS WITHOUT CONTRAST CT LUMBAR SPINE WITHOUT CONTRAST TECHNIQUE: Multidetector CT imaging of the abdomen and pelvis was performed following the standard protocol without IV contrast. Dedicated multiplanar reconstructions through the lumbar spine were also generated for interpretation purposes. RADIATION DOSE REDUCTION: This exam was performed according to the departmental dose-optimization program which includes automated exposure control, adjustment of the mA and/or kV according to patient size and/or use of iterative reconstruction technique. COMPARISON:  No priors. FINDINGS: Lower chest: Scattered areas of mild cylindrical bronchiectasis and what appears to be areas of  mild chronic post infectious or inflammatory scarring noted in the lung bases bilaterally. Atherosclerotic calcifications in the descending thoracic aorta as well as the right coronary artery. Moderate to large hiatal hernia. Hepatobiliary: No definite suspicious cystic or solid hepatic lesions are confidently identified on today's noncontrast CT examination. Gallbladder is moderately distended. Slight heterogeneous  attenuation within the lumen of the gallbladder noted, which could suggest the presence of multiple noncalcified gallstones. Gallbladder wall does not appear thickened. No pericholecystic fluid or surrounding inflammatory changes. Common bile duct appears dilated estimated to measure proximally 13 mm in the porta hepatis. Small 5 mm calcification noted in the region of the distal common bile duct, concerning for choledocholithiasis (axial image 30 of series 1). There may be some mild intrahepatic biliary ductal dilatation, difficult to judge on today's noncontrast CT examination. Pancreas: No pancreatic mass. No pancreatic ductal dilatation. No pancreatic or peripancreatic fluid collections or inflammatory changes. Spleen: Unremarkable. Adrenals/Urinary Tract: No calcifications are identified within the collecting system of either kidney, along the course of either ureter, or within the lumen of the urinary bladder. No hydroureteronephrosis. There are several right-sided renal lesions, incompletely characterized on today's noncontrast CT examination, most concerning of which in the upper pole measures 1.2 cm in diameter and is 42 HU. Unenhanced appearance of the left kidney is unremarkable. Urinary bladder is nearly completely decompressed, but otherwise unremarkable in appearance. Stomach/Bowel: The appearance of the intra-abdominal portion of the stomach is unremarkable. Periampullary duodenal diverticulum without surrounding inflammatory changes to suggest an acute diverticulitis at this time. No pathologic dilatation of small bowel or colon. Numerous colonic diverticula are noted, without surrounding inflammatory changes to indicate an acute diverticulitis at this time. The appendix is not confidently identified and may be surgically absent. Regardless, there are no inflammatory changes noted adjacent to the cecum to suggest the presence of an acute appendicitis at this time. Vascular/Lymphatic: Atherosclerosis  in the abdominal aorta and pelvic vasculature. No lymphadenopathy noted in the abdomen or pelvis. Reproductive: Uterus and ovaries are atrophic. Other: No significant volume of ascites.  No pneumoperitoneum. Musculoskeletal: Chronic appearing compression fracture of L2 superior endplate with [id number] loss of anterior vertebral body height. There is also some compression of the inferior endplate of L4 vertebral body most evident on the left side where there is up to 50% loss of vertebral body height. Advanced degenerative disc disease noted at L4-L5 with extensive discogenic changes. There are no aggressive appearing lytic or blastic lesions noted in the visualized portions of the skeleton. IMPRESSION: 1. No urinary tract calculi or findings of urinary tract obstruction. 2. Findings are concerning for probable obstructive choledocholithiasis. Further evaluation with abdominal MRI with and without IV gadolinium with MRCP is recommended at this time if there is any clinical or biochemical evidence concerning for biliary tract obstruction. There also appear to be multiple noncalcified gallstones filling the lumen of the gallbladder (difficult to say for certain on today's CT examination), with moderate distension of the gallbladder, but no overt imaging findings to suggest an acute cholecystitis at this time. 3. Moderate to large hiatal hernia. 4. Colonic diverticulosis without evidence of acute diverticulitis at this time. 5. Bronchiectasis and mild scarring in the lung bases bilaterally. 6. Additional incidental findings, as above. Electronically Signed   By: Vinnie Langton M.D.   On: 07/05/2022 07:13   CT L-SPINE NO CHARGE  Result Date: 07/05/2022 CLINICAL DATA:  77 year old female presenting with history of right-sided flank and abdominal pain. EXAM: CT ABDOMEN AND PELVIS WITHOUT  CONTRAST CT LUMBAR SPINE WITHOUT CONTRAST TECHNIQUE: Multidetector CT imaging of the abdomen and pelvis was performed following the  standard protocol without IV contrast. Dedicated multiplanar reconstructions through the lumbar spine were also generated for interpretation purposes. RADIATION DOSE REDUCTION: This exam was performed according to the departmental dose-optimization program which includes automated exposure control, adjustment of the mA and/or kV according to patient size and/or use of iterative reconstruction technique. COMPARISON:  No priors. FINDINGS: Lower chest: Scattered areas of mild cylindrical bronchiectasis and what appears to be areas of mild chronic post infectious or inflammatory scarring noted in the lung bases bilaterally. Atherosclerotic calcifications in the descending thoracic aorta as well as the right coronary artery. Moderate to large hiatal hernia. Hepatobiliary: No definite suspicious cystic or solid hepatic lesions are confidently identified on today's noncontrast CT examination. Gallbladder is moderately distended. Slight heterogeneous attenuation within the lumen of the gallbladder noted, which could suggest the presence of multiple noncalcified gallstones. Gallbladder wall does not appear thickened. No pericholecystic fluid or surrounding inflammatory changes. Common bile duct appears dilated estimated to measure proximally 13 mm in the porta hepatis. Small 5 mm calcification noted in the region of the distal common bile duct, concerning for choledocholithiasis (axial image 30 of series 1). There may be some mild intrahepatic biliary ductal dilatation, difficult to judge on today's noncontrast CT examination. Pancreas: No pancreatic mass. No pancreatic ductal dilatation. No pancreatic or peripancreatic fluid collections or inflammatory changes. Spleen: Unremarkable. Adrenals/Urinary Tract: No calcifications are identified within the collecting system of either kidney, along the course of either ureter, or within the lumen of the urinary bladder. No hydroureteronephrosis. There are several right-sided renal  lesions, incompletely characterized on today's noncontrast CT examination, most concerning of which in the upper pole measures 1.2 cm in diameter and is 42 HU. Unenhanced appearance of the left kidney is unremarkable. Urinary bladder is nearly completely decompressed, but otherwise unremarkable in appearance. Stomach/Bowel: The appearance of the intra-abdominal portion of the stomach is unremarkable. Periampullary duodenal diverticulum without surrounding inflammatory changes to suggest an acute diverticulitis at this time. No pathologic dilatation of small bowel or colon. Numerous colonic diverticula are noted, without surrounding inflammatory changes to indicate an acute diverticulitis at this time. The appendix is not confidently identified and may be surgically absent. Regardless, there are no inflammatory changes noted adjacent to the cecum to suggest the presence of an acute appendicitis at this time. Vascular/Lymphatic: Atherosclerosis in the abdominal aorta and pelvic vasculature. No lymphadenopathy noted in the abdomen or pelvis. Reproductive: Uterus and ovaries are atrophic. Other: No significant volume of ascites.  No pneumoperitoneum. Musculoskeletal: Chronic appearing compression fracture of L2 superior endplate with D34-534 loss of anterior vertebral body height. There is also some compression of the inferior endplate of L4 vertebral body most evident on the left side where there is up to 50% loss of vertebral body height. Advanced degenerative disc disease noted at L4-L5 with extensive discogenic changes. There are no aggressive appearing lytic or blastic lesions noted in the visualized portions of the skeleton. IMPRESSION: 1. No urinary tract calculi or findings of urinary tract obstruction. 2. Findings are concerning for probable obstructive choledocholithiasis. Further evaluation with abdominal MRI with and without IV gadolinium with MRCP is recommended at this time if there is any clinical or  biochemical evidence concerning for biliary tract obstruction. There also appear to be multiple noncalcified gallstones filling the lumen of the gallbladder (difficult to say for certain on today's CT examination), with moderate distension of  the gallbladder, but no overt imaging findings to suggest an acute cholecystitis at this time. 3. Moderate to large hiatal hernia. 4. Colonic diverticulosis without evidence of acute diverticulitis at this time. 5. Bronchiectasis and mild scarring in the lung bases bilaterally. 6. Additional incidental findings, as above. Electronically Signed   By: Vinnie Langton M.D.   On: 07/05/2022 07:13   DG Lumbar Spine Complete  Result Date: 06/30/2022 CLINICAL DATA:  Back pain EXAM: LUMBAR SPINE - COMPLETE 4 VIEW COMPARISON:  05/01/2017 FINDINGS: Stable compression deformity of L4 with sclerotic endplate changes [id number]. Disc space narrowing L1-2 through L4-5. New compression deformity of L2 with about 50% height loss. No focal osteolytic or osteoblastic lesions. No spondylolisthesis. Osseous structures appear osteopenic. Incidental note made of atheromatous calcification of the aorta. IMPRESSION: Degenerative changes.  L2 compression deformity new since 2018. Electronically Signed   By: Sammie Bench M.D.   On: 06/30/2022 10:49    Microbiology: Results for orders placed or performed during the hospital encounter of 06/24/22  Urine Culture     Status: Abnormal   Collection Time: 06/24/22  3:41 PM   Specimen: Urine, Clean Catch  Result Value Ref Range Status   Specimen Description   Final    URINE, CLEAN CATCH Performed at [facility], [address], [place], [place] [address]    Special Requests   Final    NONE Performed at [facility], [address], [place], [place] [address]    Culture MULTIPLE SPECIES PRESENT, SUGGEST RECOLLECTION (A)  Final   Report Status 06/25/2022 FINAL  Final      Discharge time spent: greater than 30 minutes.  Signed: Karmen Bongo, MD [facility] 07/07/2022

## 2022-07-09 ENCOUNTER — Other Ambulatory Visit: Payer: Self-pay

## 2022-07-09 ENCOUNTER — Telehealth: Payer: Self-pay

## 2022-07-09 ENCOUNTER — Ambulatory Visit: Payer: Medicare Other | Admitting: Family

## 2022-07-09 DIAGNOSIS — K8050 Calculus of bile duct without cholangitis or cholecystitis without obstruction: Secondary | ICD-10-CM

## 2022-07-09 DIAGNOSIS — K839 Disease of biliary tract, unspecified: Secondary | ICD-10-CM

## 2022-07-09 NOTE — Transitions of Care (Post Inpatient/ED Visit) (Signed)
   07/09/2022  Name: Cheryl Lee MRN: [id number] DOB: April 23, 1946  Today's TOC FU Call Status: Today's TOC FU Call Status:: Successful TOC FU Call Competed Unsuccessful Call (1st Attempt) Date: 07/09/22 [facility] FU Call Complete Date: 07/09/22  Transition Care Management Follow-up Telephone Call Date of Discharge: 07/07/22 Discharge Facility: [facility]) Type of Discharge: Inpatient Admission Primary Inpatient Discharge Diagnosis:: choledocholithiasis, back pain How have you been since you were released from the hospital?: Better (feeling better and I had a good bowel movement this morning) Any questions or concerns?: No  Items Reviewed: Did you receive and understand the discharge instructions provided?: Yes Medications obtained and verified?: Yes (Medications Reviewed) Any new allergies since your discharge?: No Dietary orders reviewed?: NA Do you have support at home?: Yes People in Home: child(ren), adult Name of Support/Comfort Primary Source: daughter [place] and Equipment/Supplies: [place] Ordered?: NA Any new equipment or medical supplies ordered?: NA  Functional Questionnaire: Do you need assistance with bathing/showering or dressing?: No Do you need assistance with meal preparation?: No Do you need assistance with eating?: No Do you have difficulty maintaining continence: No Do you need assistance with getting out of bed/getting out of a chair/moving?: No Do you have difficulty managing or taking your medications?: No  Folllow up appointments reviewed: PCP Follow-up appointment confirmed?: Yes Date of PCP follow-up appointment?: 07/10/22 Follow-up Provider: [facility] Follow-up appointment confirmed?: NA Do you need transportation to your follow-up appointment?: No Do you understand care options if your condition(s) worsen?: Yes-patient verbalized understanding  SDOH Interventions Today    Flowsheet  Row Most Recent Value  SDOH Interventions   Food Insecurity Interventions Intervention Not Indicated  Transportation Interventions Intervention Not Indicated      TOC Interventions Today    Flowsheet Row Most Recent Value  TOC Interventions   TOC Interventions Discussed/Reviewed TOC Interventions Discussed, Post discharge activity limitations per provider  [Discussed  that daughter is contacting back pain doctor for follow up as instructed]       Peter Garter RN, Jackquline [place], [place] Management Coordinator [place] [facility] [phone]

## 2022-07-09 NOTE — Telephone Encounter (Signed)
EUS ERCP scheduled for 08/23/22 at [facility] with GM at 915 am

## 2022-07-09 NOTE — Telephone Encounter (Signed)
Patients daughter called back to get the EUS back on the schedule if possible.

## 2022-07-09 NOTE — Telephone Encounter (Signed)
I am okay with the patient being rescheduled for EUS/ERCP. I am also okay with the patient being scheduled just for an EUS and a subsequent ERCP can be considered if stones are found at the time of EUS. I am also okay with the patient being seen by the providers who met her during her hospitalization to discuss this further and they can then refer back to me what they would like to be done. Otherwise if this patient/family want to be seen by me, they can be scheduled in a nonoverbook slot new patient with me.  Patty, Sorry you need to deal with having to go through this on/off. You may offer the patient/patient's daughter any of these options, just keep me up-to-date in the rest of her primary GI team. Thanks. GM

## 2022-07-09 NOTE — Telephone Encounter (Signed)
I called the pt and she tells me to cancel it-she is not interested in any procedures.  I tried to advise what/why the procedure is being done and she tells me "I know all of that and I told them in the hospital that I do not want to do it." I have cancelled.

## 2022-07-10 ENCOUNTER — Encounter: Payer: Self-pay | Admitting: Family

## 2022-07-10 ENCOUNTER — Ambulatory Visit (INDEPENDENT_AMBULATORY_CARE_PROVIDER_SITE_OTHER): Payer: Medicare Other | Admitting: Family

## 2022-07-10 VITALS — BP 124/83 | HR 99 | Temp 97.6°F | Ht 60.0 in | Wt 140.6 lb

## 2022-07-10 DIAGNOSIS — K8050 Calculus of bile duct without cholangitis or cholecystitis without obstruction: Secondary | ICD-10-CM

## 2022-07-10 DIAGNOSIS — S32020D Wedge compression fracture of second lumbar vertebra, subsequent encounter for fracture with routine healing: Secondary | ICD-10-CM

## 2022-07-10 DIAGNOSIS — S32020A Wedge compression fracture of second lumbar vertebra, initial encounter for closed fracture: Secondary | ICD-10-CM

## 2022-07-10 DIAGNOSIS — Z09 Encounter for follow-up examination after completed treatment for conditions other than malignant neoplasm: Secondary | ICD-10-CM

## 2022-07-10 DIAGNOSIS — M5136 Other intervertebral disc degeneration, lumbar region: Secondary | ICD-10-CM

## 2022-07-10 DIAGNOSIS — I10 Essential (primary) hypertension: Secondary | ICD-10-CM | POA: Diagnosis not present

## 2022-07-10 MED ORDER — VITAMIN D (ERGOCALCIFEROL) 1.25 MG (50000 UNIT) PO CAPS: 50000.0000 [IU] | ORAL_CAPSULE | ORAL | 3 refills | Status: DC

## 2022-07-10 NOTE — Progress Notes (Signed)
Subjective:    Patient ID: Cheryl Lee, female    DOB: April 14, 1946, 77 y.o.   MRN: [id number]  Chief Complaint  Patient presents with   Hospitalization Follow-up   PT presents to the office today for hospital follow up. She went to the ED on 06/24/22 for dysuria and given Keflex and muscle relaxer for back pain. She returned to ED on 06/30/22 with continued back pain and diagnosed with a closed compression fracture of L2.   She went back to the ED on 07/05/22 and diagnosed with choledocholithiasis. She has a follow up with GI for possible endoscopic ultrasound and possible cholecystectomy.    She has follow up with Dr. Hulen Shouts in 4-6 weeks for compression fracture.  Back Pain This is a recurrent problem. The current episode started 1 to 4 weeks ago. The problem occurs intermittently. The problem has been waxing and waning since onset. The pain is present in the lumbar spine. The quality of the pain is described as aching. The pain is at a severity of 3/10 (when moving). The pain is mild. She has tried bed rest for the symptoms. The treatment provided mild relief.      Review of Systems  Musculoskeletal:  Positive for back pain.  All other systems reviewed and are negative.      Objective:   Physical Exam Vitals reviewed.  Constitutional:      General: She is not in acute distress.    Appearance: She is well-developed.  HENT:     Head: Normocephalic and atraumatic.  Eyes:     Pupils: Pupils are equal, round, and reactive to light.  Neck:     Thyroid: No thyromegaly.  Cardiovascular:     Rate and Rhythm: Normal rate and regular rhythm.     Heart sounds: Normal heart sounds. No murmur heard. Pulmonary:     Effort: Pulmonary effort is normal. No respiratory distress.     Breath sounds: Normal breath sounds. No wheezing.  Abdominal:     General: Bowel sounds are normal. There is no distension.     Palpations: Abdomen is soft.     Tenderness: There is no abdominal tenderness  (no tenderness on exam).  Musculoskeletal:        General: Tenderness (mild tenderness in lumbar with flexion, back brace in place) present. Normal range of motion.     Cervical back: Normal range of motion and neck supple.  Skin:    General: Skin is warm and dry.  Neurological:     Mental Status: She is alert and oriented to person, place, and time.     Cranial Nerves: No cranial nerve deficit.     Deep Tendon Reflexes: Reflexes are normal and symmetric.  Psychiatric:        Behavior: Behavior normal.        Thought Content: Thought content normal.        Judgment: Judgment normal.     BP 124/83   Pulse 99   Temp 97.6 F (36.4 C) (Temporal)   Ht 5' (1.524 m)   Wt 140 lb 9.6 oz (63.8 kg)   LMP 08/05/1991   SpO2 95%   BMI 27.46 kg/m        Assessment & Plan:  .Cheryl Lee comes in today with chief complaint of Hospitalization Follow-up   Diagnosis and orders addressed:  1. Choledocholithiasis - CMP14+EGFR - CBC with Differential/Platelet  2. Compression fracture of L2 vertebra, initial encounter (HCC) - CMP14+EGFR - CBC  with Differential/Platelet  3. DDD (degenerative disc disease), lumbar - CMP14+EGFR - CBC with Differential/Platelet  4. Compression fracture of L2 vertebra with routine healing, subsequent encounter - CMP14+EGFR - CBC with Differential/Platelet  5. Essential hypertension - CMP14+EGFR - CBC with Differential/Platelet  6. Hospital discharge follow-up - CMP14+EGFR - CBC with Differential/Platelet   Labs pending Health Maintenance reviewed Diet and exercise encouraged  Follow up plan: Keep chronic follow up, keep follow up with GI and [facility]    Evelina Dun, FNP

## 2022-07-10 NOTE — Patient Instructions (Signed)
Spinal Compression Fracture  A spinal compression fracture is a collapse of the bones that form the spine (vertebrae). With this type of fracture, the vertebrae become pushed (compressed) into a wedge shape. Most compression fractures happen in the middle or lower part of the spine. What are the causes? This condition may be caused by: Thinning and loss of density in the bones (osteoporosis). This is the most common cause. A fall. A car or motorcycle accident. Cancer. Trauma, such as a heavy, direct hit to the head or back. What increases the risk? You are more likely to develop this condition if: You are 60 years of age or older. You have osteoporosis. You have certain types of cancer, including: Multiple myeloma. Lymphoma. Prostate cancer. Lung cancer. Breast cancer. What are the signs or symptoms? Symptoms of this condition include: Severe pain with simple movements such as coughing or sneezing. Pain that gets worse over time. Pain that is worse when you stand, walk, sit, or bend. Sudden pain that is so bad that it is hard for you to move. Bending or humping of the spine. Gradual loss of height. Numbness, tingling, or weakness in the back and legs. Trouble walking. Your symptoms will depend on the cause of the fracture and how quickly it develops. How is this diagnosed? This condition may be diagnosed based on symptoms, medical history, and a physical exam. During the physical exam, your health care provider may tap along the length of your spine to check for tenderness. Tests may be done to confirm the diagnosis. They may include: A bone mineral density test to check for osteoporosis. Imaging tests, such as a spine X-ray, CT scan, or MRI. How is this treated? Treatment depends on the cause and severity of the condition. Some fractures may heal on their own with supportive care. Treatment may include: Pain medicine. Rest. A back brace. Physical therapy exercises. Medicine  to strengthen bone. Calcium and vitamin D supplements. Fractures that cause the back to become misshapen, cause nerve pain or weakness, or do not respond to other treatment may be treated with surgery. This may include: Vertebroplasty. Bone cement is injected into the collapsed vertebrae to stabilize them. Balloon kyphoplasty. The collapsed vertebrae are expanded with a balloon and then bone cement is injected into them. Spinal fusion. The collapsed vertebrae are connected (fused) to normal vertebrae. Follow these instructions at home: Medicines Take over-the-counter and prescription medicines only as told by your health care provider. Ask your health care provider if the medicine prescribed to you: Requires you to avoid driving or using machinery. Can cause constipation. You may need to take these actions to prevent or treat constipation: Drink enough fluid to keep your urine pale yellow. Take over-the-counter or prescription medicines. Eat foods that are high in fiber, such as beans, whole grains, and fresh fruits and vegetables. Limit foods that are high in fat and processed sugars, such as fried or sweet foods. If you have a brace: Wear the brace as told by your health care provider. Remove it only as told by your health care provider. Loosen the brace if your fingers or toes tingle, become numb, or turn cold and blue. Keep the brace clean. If the brace is not waterproof: Do not let it get wet. Cover it with a watertight covering when you take a bath or a shower. Managing pain, stiffness, and swelling  If directed, put ice on the injured area. To do this: If you have a removable brace, remove it as   told by your health care provider. Put ice in a plastic bag. Place a towel between your skin and the bag. Leave the ice on for 20 minutes, 2-3 times a day. Remove the ice if your skin turns bright red. This is very important. If you cannot feel pain, heat, or cold, you have a greater risk  of damage to the area. Activity Rest as told by your health care provider. Avoid sitting for a long time without moving. Get up to take short walks every 1-2 hours. This is important to improve blood flow and breathing. Ask for help if you feel weak or unsteady. Return to your normal activities as told by your health care provider. Ask what activities are safe for you. Do physical therapy exercises to improve movement and strength in your back, as recommended by your health care provider. Exercise regularly as directed by your health care provider. General instructions  Do not drink alcohol. Alcohol can interfere with your treatment. Do not use any products that contain nicotine or tobacco, such as cigarettes, e-cigarettes, and chewing tobacco. These can delay bone healing. If you need help quitting, ask your health care provider. Keep all follow-up visits. This is important. It can help to prevent permanent injury, disability, and long-lasting (chronic) pain. Contact a health care provider if: You have a fever. Your pain medicine is not helping. Your pain does not get better over time. You cannot return to your normal activities as planned or expected. Get help right away if: Your pain is very bad and it suddenly gets worse. You are unable to move any body part (paralysis) that is below the level of your injury. You have numbness, tingling, or weakness in any body part that is below the level of your injury. You cannot control your bladder or bowels. Summary A spinal compression fracture is a collapse of the bones that form the spine (vertebrae). With this type of fracture, the vertebrae become pushed (compressed) into a wedge shape. Your symptoms and treatment will depend on the cause and severity of the fracture and how quickly it develops. Some fractures may heal on their own with supportive care. Fractures that cause the back to become misshapen, cause nerve pain or weakness, or do not  respond to other treatment may be treated with surgery. This information is not intended to replace advice given to you by your health care provider. Make sure you discuss any questions you have with your health care provider. Document Revised: 08/19/2019 Document Reviewed: 08/19/2019 Elsevier Patient Education  2023 Elsevier Inc.  

## 2022-07-10 NOTE — Telephone Encounter (Signed)
EUS/ERCP scheduled, pt instructed and medications reviewed.  Patient instructions mailed to home.  Patient to call with any questions or concerns.   New instructions have been mailed and sent to My Chart

## 2022-07-10 NOTE — Telephone Encounter (Signed)
Thank you :)

## 2022-07-10 NOTE — Telephone Encounter (Signed)
Pt has been rescheduled for 4/11 at 1145 am.  Left message on machine to call back

## 2022-07-11 LAB — CBC WITH DIFFERENTIAL/PLATELET
Basophils Absolute: 0 10*3/uL (ref 0.0–0.2)
Basos: 0 %
EOS (ABSOLUTE): 0 10*3/uL (ref 0.0–0.4)
Eos: 0 %
Hematocrit: 39.7 % (ref 34.0–46.6)
Hemoglobin: 12.7 g/dL (ref 11.1–15.9)
Immature Grans (Abs): 0 10*3/uL (ref 0.0–0.1)
Immature Granulocytes: 0 %
Lymphocytes Absolute: 1 10*3/uL (ref 0.7–3.1)
Lymphs: 5 %
MCH: 31.1 pg (ref 26.6–33.0)
MCHC: 32 g/dL (ref 31.5–35.7)
MCV: 97 fL (ref 79–97)
Monocytes Absolute: 0.3 10*3/uL (ref 0.1–0.9)
Monocytes: 2 %
Neutrophils Absolute: 16.3 10*3/uL — ABNORMAL HIGH (ref 1.4–7.0)
Neutrophils: 93 %
Platelets: 379 10*3/uL (ref 150–450)
RBC: 4.08 x10E6/uL (ref 3.77–5.28)
RDW: 12.7 % (ref 11.7–15.4)
WBC: 17.7 10*3/uL — ABNORMAL HIGH (ref 3.4–10.8)

## 2022-07-11 LAB — CMP14+EGFR
ALT: 21 IU/L (ref 0–32)
AST: 17 IU/L (ref 0–40)
Albumin/Globulin Ratio: 1.8 (ref 1.2–2.2)
Albumin: 4.1 g/dL (ref 3.8–4.8)
Alkaline Phosphatase: 100 IU/L (ref 44–121)
BUN/Creatinine Ratio: 29 — ABNORMAL HIGH (ref 12–28)
BUN: 30 mg/dL — ABNORMAL HIGH (ref 8–27)
Bilirubin Total: 0.3 mg/dL (ref 0.0–1.2)
CO2: 18 mmol/L — ABNORMAL LOW (ref 20–29)
Calcium: 9.9 mg/dL (ref 8.7–10.3)
Chloride: 107 mmol/L — ABNORMAL HIGH (ref 96–106)
Creatinine, Ser: 1.05 mg/dL — ABNORMAL HIGH (ref 0.57–1.00)
Globulin, Total: 2.3 g/dL (ref 1.5–4.5)
Glucose: 104 mg/dL — ABNORMAL HIGH (ref 70–99)
Potassium: 4.8 mmol/L (ref 3.5–5.2)
Sodium: 143 mmol/L (ref 134–144)
Total Protein: 6.4 g/dL (ref 6.0–8.5)
eGFR: 55 mL/min/{1.73_m2} — ABNORMAL LOW (ref >59)

## 2022-07-11 NOTE — Telephone Encounter (Signed)
Inbound call from patient's daughter, is requesting to reschedule procedure for week before 4/11. She states she is off of work that week and would be better for transportation. Advised patient a nurse would call her to speak about openings. Please advise.

## 2022-07-11 NOTE — Telephone Encounter (Signed)
The pt has been advised that we have no appts available prior to her April date.  She will speak with her daughter and make her aware and call back if she needs to move the appt further out.

## 2022-07-12 ENCOUNTER — Other Ambulatory Visit: Payer: Medicare Other

## 2022-07-12 ENCOUNTER — Other Ambulatory Visit: Payer: Self-pay | Admitting: Family Medicine

## 2022-07-12 DIAGNOSIS — D72829 Elevated white blood cell count, unspecified: Secondary | ICD-10-CM

## 2022-07-12 NOTE — Telephone Encounter (Signed)
Patient's daughter called, stated if any cancellations came up the week before 4/11, they would like to be scheduled during that week but for now they would keep current appointment.

## 2022-07-12 NOTE — Telephone Encounter (Signed)
Noted  

## 2022-07-13 ENCOUNTER — Other Ambulatory Visit: Payer: Self-pay | Admitting: Family

## 2022-07-13 DIAGNOSIS — K8050 Calculus of bile duct without cholangitis or cholecystitis without obstruction: Secondary | ICD-10-CM

## 2022-07-13 DIAGNOSIS — D72829 Elevated white blood cell count, unspecified: Secondary | ICD-10-CM

## 2022-07-13 LAB — CBC WITH DIFFERENTIAL/PLATELET
Basophils Absolute: 0 10*3/uL (ref 0.0–0.2)
Basos: 0 %
EOS (ABSOLUTE): 0 10*3/uL (ref 0.0–0.4)
Eos: 0 %
Hematocrit: 38.7 % (ref 34.0–46.6)
Hemoglobin: 12.7 g/dL (ref 11.1–15.9)
Immature Grans (Abs): 0.1 10*3/uL (ref 0.0–0.1)
Immature Granulocytes: 1 %
Lymphocytes Absolute: 1 10*3/uL (ref 0.7–3.1)
Lymphs: 6 %
MCH: 31.5 pg (ref 26.6–33.0)
MCHC: 32.8 g/dL (ref 31.5–35.7)
MCV: 96 fL (ref 79–97)
Monocytes Absolute: 0.1 10*3/uL (ref 0.1–0.9)
Monocytes: 1 %
Neutrophils Absolute: 15.1 10*3/uL — ABNORMAL HIGH (ref 1.4–7.0)
Neutrophils: 92 %
Platelets: 427 10*3/uL (ref 150–450)
RBC: 4.03 x10E6/uL (ref 3.77–5.28)
RDW: 12.4 % (ref 11.7–15.4)
WBC: 16.3 10*3/uL — ABNORMAL HIGH (ref 3.4–10.8)

## 2022-07-17 ENCOUNTER — Other Ambulatory Visit: Payer: Self-pay | Admitting: Family

## 2022-07-17 DIAGNOSIS — D72829 Elevated white blood cell count, unspecified: Secondary | ICD-10-CM

## 2022-07-18 ENCOUNTER — Other Ambulatory Visit: Payer: Medicare Other

## 2022-07-18 DIAGNOSIS — D72829 Elevated white blood cell count, unspecified: Secondary | ICD-10-CM | POA: Diagnosis not present

## 2022-07-18 LAB — CBC WITH DIFFERENTIAL/PLATELET
Basophils Absolute: 0 10*3/uL (ref 0.0–0.2)
Basos: 0 %
EOS (ABSOLUTE): 0.3 10*3/uL (ref 0.0–0.4)
Eos: 3 %
Hematocrit: 38 % (ref 34.0–46.6)
Hemoglobin: 12.5 g/dL (ref 11.1–15.9)
Immature Grans (Abs): 0 10*3/uL (ref 0.0–0.1)
Immature Granulocytes: 0 %
Lymphocytes Absolute: 2.5 10*3/uL (ref 0.7–3.1)
Lymphs: 32 %
MCH: 32 pg (ref 26.6–33.0)
MCHC: 32.9 g/dL (ref 31.5–35.7)
MCV: 97 fL (ref 79–97)
Monocytes Absolute: 0.6 10*3/uL (ref 0.1–0.9)
Monocytes: 8 %
Neutrophils Absolute: 4.5 10*3/uL (ref 1.4–7.0)
Neutrophils: 57 %
Platelets: 358 10*3/uL (ref 150–450)
RBC: 3.91 x10E6/uL (ref 3.77–5.28)
RDW: 12.3 % (ref 11.7–15.4)
WBC: 8 10*3/uL (ref 3.4–10.8)

## 2022-07-19 ENCOUNTER — Other Ambulatory Visit: Payer: Self-pay | Admitting: Family

## 2022-07-19 MED ORDER — VITAMIN D (ERGOCALCIFEROL) 1.25 MG (50000 UNIT) PO CAPS: 50000.0000 [IU] | ORAL_CAPSULE | ORAL | 3 refills | Status: DC

## 2022-07-30 ENCOUNTER — Ambulatory Visit: Payer: Self-pay | Admitting: Surgery

## 2022-07-30 DIAGNOSIS — K8070 Calculus of gallbladder and bile duct without cholecystitis without obstruction: Secondary | ICD-10-CM | POA: Diagnosis not present

## 2022-07-30 NOTE — H&P (Signed)
History of Present Illness: Cheryl Lee is a 77 y.o. female who is seen today for follow up of cholelithiasis with choledocholithiasis. She was admitted at [facility] on 2/22 with back pain secondary to an L2 fracture. She had been having pain for several weeks, which then progressed more to the right side. During her ED workup she underwent a RUQ [place], which showed no gallstones but a borderline dilated CBD at 55mm. She then had an MRCP, which showed stones in her distal CBD as well as stones in the gallbladder. Her LFTs were normal and lipase was only very mildly elevated at 78. She did not have any abdominal pain, and denies ever having epigastric or RUQ pain. She does not have any postprandial pain. GI was consulted and the patient was tentatively scheduled for an ERCP, but this was not completed due to timing, and the patient ultimately declined to have this as an inpatient and opted for outpatient follow up. She has been scheduled for an EUS/ERCP on 4/11 at [facility] with Dr. Rush Landmark. She was seen by surgery as an inpatient and is here today to discuss cholecystectomy.   Since discharge she has not had any abdominal pain. She is eating without difficulty. She has not had any prior abdominal surgeries.     Review of Systems: A complete review of systems was obtained from the patient.  I have reviewed this information and discussed as appropriate with the patient.  See HPI as well for other ROS.     Medical History: Past Medical History Past Medical History: Diagnosis Date  Arthritis    GERD (gastroesophageal reflux disease)    Hyperlipidemia    Hypertension    Seizures (CMS-HCC)        There is no problem list on file for this patient.     Past Surgical History Past Surgical History: Procedure Laterality Date  CATARACT EXTRACTION          Allergies Allergies Allergen Reactions  Lycopene Itching and Rash      Current Outpatient Medications on File Prior to  Visit Medication Sig Dispense Refill  alendronate (FOSAMAX) 70 MG tablet Take by mouth      atorvastatin (LIPITOR) 10 MG tablet Take 1 tablet by mouth once daily      calcitonin, salmon, (MIACALCIN) 200 unit/actuation nasal spray Place into one nostril      fluconazole (DIFLUCAN) 150 MG tablet Take 150 mg by mouth every 7 (seven) days      fluticasone propionate (FLONASE) 50 mcg/actuation nasal spray Place into one nostril      omeprazole (PRILOSEC) 20 MG DR capsule Take 1 capsule by mouth once daily      predniSONE (DELTASONE) 10 MG tablet TAKE 3 TABLETS BY MOUTH DAILY FOR 5 DAYS.      tiZANidine (ZANAFLEX) 2 MG capsule TAKE 1 CAPSULE TWICE DAILY AS NEEDED FOR MUSCLE SPASMS- NO ALCOHOL, NO DRIVING-MAY MAKE DROWSY       No current facility-administered medications on file prior to visit.     Family History Family History Problem Relation Age of Onset  Hyperlipidemia (Elevated cholesterol) Mother    High blood pressure (Hypertension) Mother    Skin cancer Sister    Hyperlipidemia (Elevated cholesterol) Sister    High blood pressure (Hypertension) Sister    Coronary Artery Disease (Blocked arteries around heart) Sister    Diabetes Sister    Stroke Brother    Skin cancer Brother    Hyperlipidemia (Elevated cholesterol) Brother  High blood pressure (Hypertension) Brother        Social History   Tobacco Use Smoking Status Never Smokeless Tobacco Never     Social History Social History    Socioeconomic History  Marital status: Single Tobacco Use  Smoking status: Never  Smokeless tobacco: Never Substance and Sexual Activity  Alcohol use: Never  Drug use: Never      Objective:     Vitals:   07/30/22 1514 BP: 122/80 Pulse: 100 Temp: 36.6 C (97.8 F) SpO2: 95% Weight: 62.6 kg (138 lb) Height: 152.4 cm (5')   Body mass index is 26.95 kg/m.   Physical Exam Vitals reviewed.  Constitutional:      General: She is not in acute distress.    Appearance: Normal  appearance.  HENT:     Head: Normocephalic and atraumatic.  Cardiovascular:     Rate and Rhythm: Normal rate and regular rhythm.  Pulmonary:     Effort: Pulmonary effort is normal. No respiratory distress.  Abdominal:     General: There is no distension.     Palpations: Abdomen is soft.     Tenderness: There is no abdominal tenderness.     Comments: No surgical scars.  Musculoskeletal:        General: Normal range of motion.  Skin:    General: Skin is warm and dry.     Coloration: Skin is not jaundiced.  Neurological:     General: No focal deficit present.     Mental Status: She is alert and oriented to person, place, and time.              Assessment and Plan:    Diagnoses and all orders for this visit:   Calculus of gallbladder and bile duct without cholecystitis or obstruction     This is a 77 yo female who was recently admitted with back pain, and found to have cholelithiasis and choledocholithiasis. This was likely not the cause of her back pain, and does not appear to be causing biliary obstruction although her biliary tree was mildly dilated. She does not clinically appear jaundiced today. Although she is currently asymptomatic, I reviewed that choledocholithiasis has the potential to cause complications such as acute pancreatitis and cholangitis, thus clearance of the bile duct is recommended. I also recommended cholecystectomy to prevent recurrent choledocholithiasis. I will try to coordinate this at [facility] on the same day as her EUS/ERCP if possible. She will be contacted to schedule her surgery date. The details of laparoscopic cholecystectomy were discussed with the patient, including the risks of bleeding, infection, bile leak, and <0.5% risk of common bile duct injury. The patient expressed understanding and agrees to proceed with surgery.  Michaelle Birks, [facility], Hepatobiliary and Pancreatic Surgery 07/30/22 4:58 PM

## 2022-07-30 NOTE — H&P (View-Only) (Signed)
History of Present Illness: Cheryl Lee is a 77 y.o. female who is seen today for follow up of cholelithiasis with choledocholithiasis. She was admitted at [facility] on 2/22 with back pain secondary to an L2 fracture. She had been having pain for several weeks, which then progressed more to the right side. During her ED workup she underwent a RUQ US, which showed no gallstones but a borderline dilated CBD at 8mm. She then had an MRCP, which showed stones in her distal CBD as well as stones in the gallbladder. Her LFTs were normal and lipase was only very mildly elevated at 78. She did not have any abdominal pain, and denies ever having epigastric or RUQ pain. She does not have any postprandial pain. GI was consulted and the patient was tentatively scheduled for an ERCP, but this was not completed due to timing, and the patient ultimately declined to have this as an inpatient and opted for outpatient follow up. She has been scheduled for an EUS/ERCP on 4/11 at [facility] with Dr. Mansouraty. She was seen by surgery as an inpatient and is here today to discuss cholecystectomy.   Since discharge she has not had any abdominal pain. She is eating without difficulty. She has not had any prior abdominal surgeries.     Review of Systems: A complete review of systems was obtained from the patient.  I have reviewed this information and discussed as appropriate with the patient.  See HPI as well for other ROS.     Medical History: Past Medical History Past Medical History: Diagnosis Date  Arthritis    GERD (gastroesophageal reflux disease)    Hyperlipidemia    Hypertension    Seizures (CMS-HCC)        There is no problem list on file for this patient.     Past Surgical History Past Surgical History: Procedure Laterality Date  CATARACT EXTRACTION          Allergies Allergies Allergen Reactions  Lycopene Itching and Rash      Current Outpatient Medications on File Prior to  Visit Medication Sig Dispense Refill  alendronate (FOSAMAX) 70 MG tablet Take by mouth      atorvastatin (LIPITOR) 10 MG tablet Take 1 tablet by mouth once daily      calcitonin, salmon, (MIACALCIN) 200 unit/actuation nasal spray Place into one nostril      fluconazole (DIFLUCAN) 150 MG tablet Take 150 mg by mouth every 7 (seven) days      fluticasone propionate (FLONASE) 50 mcg/actuation nasal spray Place into one nostril      omeprazole (PRILOSEC) 20 MG DR capsule Take 1 capsule by mouth once daily      predniSONE (DELTASONE) 10 MG tablet TAKE 3 TABLETS BY MOUTH DAILY FOR 5 DAYS.      tiZANidine (ZANAFLEX) 2 MG capsule TAKE 1 CAPSULE TWICE DAILY AS NEEDED FOR MUSCLE SPASMS- NO ALCOHOL, NO DRIVING-MAY MAKE DROWSY       No current facility-administered medications on file prior to visit.     Family History Family History Problem Relation Age of Onset  Hyperlipidemia (Elevated cholesterol) Mother    High blood pressure (Hypertension) Mother    Skin cancer Sister    Hyperlipidemia (Elevated cholesterol) Sister    High blood pressure (Hypertension) Sister    Coronary Artery Disease (Blocked arteries around heart) Sister    Diabetes Sister    Stroke Brother    Skin cancer Brother    Hyperlipidemia (Elevated cholesterol) Brother      High blood pressure (Hypertension) Brother        Social History   Tobacco Use Smoking Status Never Smokeless Tobacco Never     Social History Social History    Socioeconomic History  Marital status: Single Tobacco Use  Smoking status: Never  Smokeless tobacco: Never Substance and Sexual Activity  Alcohol use: Never  Drug use: Never      Objective:     Vitals:   07/30/22 1514 BP: 122/80 Pulse: 100 Temp: 36.6 C (97.8 F) SpO2: 95% Weight: 62.6 kg (138 lb) Height: 152.4 cm (5')   Body mass index is 26.95 kg/m.   Physical Exam Vitals reviewed.  Constitutional:      General: She is not in acute distress.    Appearance: Normal  appearance.  HENT:     Head: Normocephalic and atraumatic.  Cardiovascular:     Rate and Rhythm: Normal rate and regular rhythm.  Pulmonary:     Effort: Pulmonary effort is normal. No respiratory distress.  Abdominal:     General: There is no distension.     Palpations: Abdomen is soft.     Tenderness: There is no abdominal tenderness.     Comments: No surgical scars.  Musculoskeletal:        General: Normal range of motion.  Skin:    General: Skin is warm and dry.     Coloration: Skin is not jaundiced.  Neurological:     General: No focal deficit present.     Mental Status: She is alert and oriented to person, place, and time.              Assessment and Plan:    Diagnoses and all orders for this visit:   Calculus of gallbladder and bile duct without cholecystitis or obstruction     This is a 77 yo female who was recently admitted with back pain, and found to have cholelithiasis and choledocholithiasis. This was likely not the cause of her back pain, and does not appear to be causing biliary obstruction although her biliary tree was mildly dilated. She does not clinically appear jaundiced today. Although she is currently asymptomatic, I reviewed that choledocholithiasis has the potential to cause complications such as acute pancreatitis and cholangitis, thus clearance of the bile duct is recommended. I also recommended cholecystectomy to prevent recurrent choledocholithiasis. I will try to coordinate this at [facility] on the same day as her EUS/ERCP if possible. She will be contacted to schedule her surgery date. The details of laparoscopic cholecystectomy were discussed with the patient, including the risks of bleeding, infection, bile leak, and <0.5% risk of common bile duct injury. The patient expressed understanding and agrees to proceed with surgery.  Danya Spearman, MD [facility] General, Hepatobiliary and Pancreatic Surgery 07/30/22 4:58 PM   

## 2022-08-13 NOTE — Patient Instructions (Signed)
DUE TO COVID-19 ONLY TWO VISITORS  (aged 77 and older)  ARE ALLOWED TO COME WITH YOU AND STAY IN THE WAITING ROOM ONLY DURING PRE OP AND PROCEDURE.   **NO VISITORS ARE ALLOWED IN THE SHORT STAY AREA OR RECOVERY ROOM!!**  IF YOU WILL BE ADMITTED INTO THE HOSPITAL YOU ARE ALLOWED ONLY FOUR SUPPORT PEOPLE DURING VISITATION HOURS ONLY (7 AM -8PM)   The support person(s) must pass our screening, gel in and out, and wear a mask at all times, including in the patient's room. Patients must also wear a mask when staff or their support person are in the room. Visitors GUEST BADGE MUST BE WORN VISIBLY  One adult visitor may remain with you overnight and MUST be in the room by 8 P.M.     Your procedure is scheduled on: 08/23/22   Report to [facility] Main Entrance    Report to admitting at : 9:30 AM   Call this number if you have problems the morning of surgery [phone]   Eat a light diet the day before surgery.  Examples including soups, broths, toast, yogurt, mashed potatoes.  Things to avoid include carbonated beverages (fizzy beverages), raw fruits and raw vegetables, or beans.   If your bowels are filled with gas, your surgeon will have difficulty visualizing your pelvic organs which increases your surgical risks.  Do not eat food :After Midnight.   After Midnight you may have the following liquids until : 8:30 AM DAY OF SURGERY  Water Black Coffee (sugar ok, NO MILK/CREAM OR CREAMERS)  Tea (sugar ok, NO MILK/CREAM OR CREAMERS) regular and decaf                             Plain Jell-O (NO RED)                                           Fruit ices (not with fruit pulp, NO RED)                                     Popsicles (NO RED)                                                                  Juice: apple, WHITE grape, WHITE cranberry Sports drinks like Gatorade (NO RED)    Oral Hygiene is also important to reduce your risk of infection.                                     Remember - BRUSH YOUR TEETH THE MORNING OF SURGERY WITH YOUR REGULAR TOOTHPASTE  DENTURES WILL BE REMOVED PRIOR TO SURGERY PLEASE DO NOT APPLY "Poly grip" OR ADHESIVES!!!   Do NOT smoke after Midnight   Take these medicines the morning of surgery with A SIP OF WATER: omeprazole.  You may not have any metal on your body including hair pins, jewelry, and body piercing             Do not wear make-up, lotions, powders, perfumes/cologne, or deodorant  Do not wear nail polish including gel and S&S, artificial/acrylic nails, or any other type of covering on natural nails including finger and toenails. If you have artificial nails, gel coating, etc. that needs to be removed by a nail salon please have this removed prior to surgery or surgery may need to be canceled/ delayed if the surgeon/ anesthesia feels like they are unable to be safely monitored.   Do not shave  48 hours prior to surgery.    Do not bring valuables to the hospital. [place].   Contacts, glasses, or bridgework may not be worn into surgery.   Bring small overnight bag day of surgery.   DO NOT [place]. PHARMACY WILL DISPENSE MEDICATIONS LISTED ON YOUR MEDICATION LIST TO YOU DURING YOUR ADMISSION [place]!    Patients discharged on the day of surgery will not be allowed to drive home.  Someone NEEDS to stay with you for the first 24 hours after anesthesia.   Special Instructions: Bring a copy of your healthcare power of attorney and living will documents         the day of surgery if you haven't scanned them before.              Please read over the following fact sheets you were given: IF YOU HAVE QUESTIONS ABOUT YOUR PRE-OP INSTRUCTIONS PLEASE CALL [phone]    [facility] - Preparing for Surgery Before surgery, you can play an important role.  Because skin is not sterile, your skin needs to be as  free of germs as possible.  You can reduce the number of germs on your skin by washing with CHG (chlorahexidine gluconate) soap before surgery.  CHG is an antiseptic cleaner which kills germs and bonds with the skin to continue killing germs even after washing. Please DO NOT use if you have an allergy to CHG or antibacterial soaps.  If your skin becomes reddened/irritated stop using the CHG and inform your nurse when you arrive at Short Stay. Do not shave (including legs and underarms) for at least 48 hours prior to the first CHG shower.  You may shave your face/neck. Please follow these instructions carefully:  1.  Shower with CHG Soap the night before surgery and the  morning of Surgery.  2.  If you choose to wash your hair, wash your hair first as usual with your  normal  shampoo.  3.  After you shampoo, rinse your hair and body thoroughly to remove the  shampoo.                           4.  Use CHG as you would any other liquid soap.  You can apply chg directly  to the skin and wash                       Gently with a scrungie or clean washcloth.  5.  Apply the CHG Soap to your body ONLY FROM THE NECK DOWN.   Do not use on face/ open  Wound or open sores. Avoid contact with eyes, ears mouth and genitals (private parts).                       Wash face,  Genitals (private parts) with your normal soap.             6.  Wash thoroughly, paying special attention to the area where your surgery  will be performed.  7.  Thoroughly rinse your body with warm water from the neck down.  8.  DO NOT shower/wash with your normal soap after using and rinsing off  the CHG Soap.                9.  Pat yourself dry with a clean towel.            10.  Wear clean pajamas.            11.  Place clean sheets on your bed the night of your first shower and do not  sleep with pets. Day of Surgery : Do not apply any lotions/deodorants the morning of surgery.  Please wear clean clothes to the  hospital/surgery center.  FAILURE TO FOLLOW THESE INSTRUCTIONS MAY RESULT IN THE CANCELLATION OF YOUR SURGERY PATIENT SIGNATURE_________________________________  NURSE SIGNATURE__________________________________  ________________________________________________________________________

## 2022-08-14 ENCOUNTER — Encounter (HOSPITAL_COMMUNITY): Payer: Self-pay | Admitting: *Deleted

## 2022-08-14 ENCOUNTER — Encounter (HOSPITAL_COMMUNITY): Payer: Self-pay

## 2022-08-14 ENCOUNTER — Encounter (HOSPITAL_COMMUNITY)
Admission: RE | Admit: 2022-08-14 | Discharge: 2022-08-14 | Disposition: A | Discharge status: Home / Self Care | Payer: Medicare Other | Source: Ambulatory Visit | Attending: Surgery | Admitting: Surgery

## 2022-08-14 ENCOUNTER — Other Ambulatory Visit: Payer: Self-pay

## 2022-08-14 ENCOUNTER — Emergency Department (HOSPITAL_COMMUNITY): Payer: Medicare Other

## 2022-08-14 ENCOUNTER — Emergency Department (HOSPITAL_COMMUNITY)
Admission: EM | Admit: 2022-08-14 | Discharge: 2022-08-14 | Disposition: A | Discharge status: Home / Self Care | Payer: Medicare Other | Attending: Emergency Medicine | Admitting: Emergency Medicine

## 2022-08-14 VITALS — BP 78/58 | HR 115 | Temp 98.1°F | Ht 60.0 in | Wt 131.0 lb

## 2022-08-14 DIAGNOSIS — R000 Tachycardia, unspecified: Secondary | ICD-10-CM | POA: Diagnosis present

## 2022-08-14 DIAGNOSIS — N179 Acute kidney failure, unspecified: Secondary | ICD-10-CM | POA: Diagnosis not present

## 2022-08-14 DIAGNOSIS — Z7982 Long term (current) use of aspirin: Secondary | ICD-10-CM | POA: Diagnosis not present

## 2022-08-14 DIAGNOSIS — R531 Weakness: Secondary | ICD-10-CM | POA: Insufficient documentation

## 2022-08-14 DIAGNOSIS — Z01818 Encounter for other preprocedural examination: Secondary | ICD-10-CM | POA: Insufficient documentation

## 2022-08-14 DIAGNOSIS — R109 Unspecified abdominal pain: Secondary | ICD-10-CM | POA: Diagnosis not present

## 2022-08-14 DIAGNOSIS — K5730 Diverticulosis of large intestine without perforation or abscess without bleeding: Secondary | ICD-10-CM | POA: Diagnosis not present

## 2022-08-14 DIAGNOSIS — E860 Dehydration: Secondary | ICD-10-CM | POA: Diagnosis not present

## 2022-08-14 DIAGNOSIS — I10 Essential (primary) hypertension: Secondary | ICD-10-CM

## 2022-08-14 DIAGNOSIS — S32040A Wedge compression fracture of fourth lumbar vertebra, initial encounter for closed fracture: Secondary | ICD-10-CM | POA: Diagnosis not present

## 2022-08-14 DIAGNOSIS — K8050 Calculus of bile duct without cholangitis or cholecystitis without obstruction: Secondary | ICD-10-CM | POA: Diagnosis not present

## 2022-08-14 DIAGNOSIS — I959 Hypotension, unspecified: Secondary | ICD-10-CM | POA: Diagnosis not present

## 2022-08-14 DIAGNOSIS — Z6825 Body mass index (BMI) 25.0-25.9, adult: Secondary | ICD-10-CM | POA: Diagnosis not present

## 2022-08-14 DIAGNOSIS — A419 Sepsis, unspecified organism: Secondary | ICD-10-CM | POA: Diagnosis not present

## 2022-08-14 HISTORY — DX: Dyspnea, unspecified: R06.00

## 2022-08-14 LAB — BASIC METABOLIC PANEL
Anion gap: 11 (ref 5–15)
BUN: 42 mg/dL — ABNORMAL HIGH (ref 8–23)
CO2: 21 mmol/L — ABNORMAL LOW (ref 22–32)
Calcium: 9.6 mg/dL (ref 8.9–10.3)
Chloride: 103 mmol/L (ref 98–111)
Creatinine, Ser: 2.18 mg/dL — ABNORMAL HIGH (ref 0.44–1.00)
GFR, Estimated: 23 mL/min — ABNORMAL LOW (ref >60)
Glucose, Bld: 114 mg/dL — ABNORMAL HIGH (ref 70–99)
Potassium: 4.8 mmol/L (ref 3.5–5.1)
Sodium: 135 mmol/L (ref 135–145)

## 2022-08-14 LAB — COMPREHENSIVE METABOLIC PANEL
ALT: 17 U/L (ref 0–44)
AST: 19 U/L (ref 15–41)
Albumin: 4.1 g/dL (ref 3.5–5.0)
Alkaline Phosphatase: 61 U/L (ref 38–126)
Anion gap: 14 (ref 5–15)
BUN: 41 mg/dL — ABNORMAL HIGH (ref 8–23)
CO2: 19 mmol/L — ABNORMAL LOW (ref 22–32)
Calcium: 9.9 mg/dL (ref 8.9–10.3)
Chloride: 101 mmol/L (ref 98–111)
Creatinine, Ser: 2.23 mg/dL — ABNORMAL HIGH (ref 0.44–1.00)
GFR, Estimated: 22 mL/min — ABNORMAL LOW (ref >60)
Glucose, Bld: 102 mg/dL — ABNORMAL HIGH (ref 70–99)
Potassium: 4.3 mmol/L (ref 3.5–5.1)
Sodium: 134 mmol/L — ABNORMAL LOW (ref 135–145)
Total Bilirubin: 0.8 mg/dL (ref 0.3–1.2)
Total Protein: 7.3 g/dL (ref 6.5–8.1)

## 2022-08-14 LAB — I-STAT CHEM 8, ED
BUN: 39 mg/dL — ABNORMAL HIGH (ref 8–23)
Calcium, Ion: 1.2 mmol/L (ref 1.15–1.40)
Chloride: 104 mmol/L (ref 98–111)
Creatinine, Ser: 2.5 mg/dL — ABNORMAL HIGH (ref 0.44–1.00)
Glucose, Bld: 103 mg/dL — ABNORMAL HIGH (ref 70–99)
HCT: 37 % (ref 36.0–46.0)
Hemoglobin: 12.6 g/dL (ref 12.0–15.0)
Potassium: 4.8 mmol/L (ref 3.5–5.1)
Sodium: 134 mmol/L — ABNORMAL LOW (ref 135–145)
TCO2: 23 mmol/L (ref 22–32)

## 2022-08-14 LAB — CBC
HCT: 39.6 % (ref 36.0–46.0)
Hemoglobin: 12.9 g/dL (ref 12.0–15.0)
MCH: 31.9 pg (ref 26.0–34.0)
MCHC: 32.6 g/dL (ref 30.0–36.0)
MCV: 98 fL (ref 80.0–100.0)
Platelets: 376 10*3/uL (ref 150–400)
RBC: 4.04 MIL/uL (ref 3.87–5.11)
RDW: 14.3 % (ref 11.5–15.5)
WBC: 14.5 10*3/uL — ABNORMAL HIGH (ref 4.0–10.5)
nRBC: 0 % (ref 0.0–0.2)

## 2022-08-14 LAB — CBC WITH DIFFERENTIAL/PLATELET
Abs Immature Granulocytes: 0.06 10*3/uL (ref 0.00–0.07)
Basophils Absolute: 0.1 10*3/uL (ref 0.0–0.1)
Basophils Relative: 0 %
Eosinophils Absolute: 0.1 10*3/uL (ref 0.0–0.5)
Eosinophils Relative: 1 %
HCT: 42 % (ref 36.0–46.0)
Hemoglobin: 13.6 g/dL (ref 12.0–15.0)
Immature Granulocytes: 0 %
Lymphocytes Relative: 17 %
Lymphs Abs: 2.7 10*3/uL (ref 0.7–4.0)
MCH: 32.1 pg (ref 26.0–34.0)
MCHC: 32.4 g/dL (ref 30.0–36.0)
MCV: 99.1 fL (ref 80.0–100.0)
Monocytes Absolute: 1 10*3/uL (ref 0.1–1.0)
Monocytes Relative: 6 %
Neutro Abs: 11.6 10*3/uL — ABNORMAL HIGH (ref 1.7–7.7)
Neutrophils Relative %: 76 %
Platelets: 376 10*3/uL (ref 150–400)
RBC: 4.24 MIL/uL (ref 3.87–5.11)
RDW: 14.4 % (ref 11.5–15.5)
WBC: 15.4 10*3/uL — ABNORMAL HIGH (ref 4.0–10.5)
nRBC: 0 % (ref 0.0–0.2)

## 2022-08-14 LAB — URINALYSIS, W/ REFLEX TO CULTURE (INFECTION SUSPECTED)
Bacteria, UA: NONE SEEN
Bilirubin Urine: NEGATIVE
Glucose, UA: NEGATIVE mg/dL
Hgb urine dipstick: NEGATIVE
Ketones, ur: 5 mg/dL — AB
Leukocytes,Ua: NEGATIVE
Nitrite: NEGATIVE
Protein, ur: NEGATIVE mg/dL
Specific Gravity, Urine: 1.02 (ref 1.005–1.030)
pH: 5 (ref 5.0–8.0)

## 2022-08-14 LAB — PROTIME-INR
INR: 0.9 (ref 0.8–1.2)
Prothrombin Time: 11.8 seconds (ref 11.4–15.2)

## 2022-08-14 LAB — APTT: aPTT: 23 seconds — ABNORMAL LOW (ref 24–36)

## 2022-08-14 LAB — LACTIC ACID, PLASMA
Lactic Acid, Venous: 1.7 mmol/L (ref 0.5–1.9)
Lactic Acid, Venous: 1.1 mmol/L (ref 0.5–1.9)

## 2022-08-14 MED ORDER — LACTATED RINGERS IV BOLUS
1000.0000 mL | Freq: Once | INTRAVENOUS | Status: AC
  Administered 2022-08-14: 1000 mL via INTRAVENOUS

## 2022-08-14 MED ORDER — ONDANSETRON 4 MG PO TBDP: 4.0000 mg | ORAL_TABLET | Freq: Three times a day (TID) | ORAL | 0 refills | Status: DC | PRN

## 2022-08-14 NOTE — ED Notes (Signed)
Pt given soda. 

## 2022-08-14 NOTE — ED Triage Notes (Addendum)
Pt come today for Gall bladdeer surgery, During assessment bp noted to be 78/58 and slight tachycardia. Triage bp 82/67 C/o stomach burning and a little nausea noted

## 2022-08-14 NOTE — Progress Notes (Signed)
Blood results: Creatinine: 2.18. WBC: 14

## 2022-08-14 NOTE — ED Notes (Signed)
Patient ambulatory with steady gait to the bathroom. States that she is feeling better.

## 2022-08-14 NOTE — ED Provider Notes (Signed)
Assumed care of patient from off-going team. For more details, please see note from same day.  In brief, this is a 77 y.o. female wh opresented to o/p surgery center today, found to be hypotensive.   Plan/Dispo at time of sign-out & ED Course since sign-out: Doin better after intiail fluid bolus [ ]  labs/iamging  BP 120/74   Pulse (!) 103   Temp 97.7 F (36.5 C) (Oral)   Resp 16   LMP 08/05/1991   SpO2 96%    ED Course:   Clinical Course as of 08/14/22 2038  Tue Aug 14, 2022  1512 Received signout from Dr. Jeanell Sparrow. Went to preop for vertebral fracture and found to be hypotensive to 123XX123 systolic and tachycardic. Pending sepsis w/u.  [HN]  1514 DG Chest Port 1 View No focal airspace opacity. [HN]  1535 Lactic Acid, Venous: 1.7 [HN]  1623 WBC(!): 15.4 [HN]  1623 Urinalysis, w/ Reflex to Culture (Infection Suspected) -Urine, Catheterized(!) Neg for UTI [HN]  1624 CT ABDOMEN PELVIS WO CONTRAST 1. Stable choledocholithiasis with diffuse intrahepatic and extrahepatic biliary duct dilation unchanged since prior exams. 2. Distended gallbladder, with the noncalcified gallstones noted on prior MRI and ultrasound difficult to visualize on CT. No CT evidence of acute cholecystitis. 3. Stable hiatal hernia. 4. Distal colonic diverticulosis without diverticulitis. 5.  Aortic Atherosclerosis (ICD10-I70.0).   [HN]  [id number] No e/o diverticulitis, cholecystitis on CT abdomen [HN]  1657 Creatinine(!): 2.50 AKI up from BL <1.5 [HN]  1657 WBC(!): 15.4 +leukocytosis [HN]  1850 Patient reevaluated, BP now stable. She still has some tachycardia into 110s-low 120s. Patient/daughter state her HR normally runs in 100s-low 110s anyway. She states she recently started a med for her bones that made her nauseated and she wasn't drinking a lot of fluids. Patient's mouth still dry after 1L IVF. Will give a 2nd L IVF.  [HN]  1943 Lactic Acid, Venous: 1.1 [HN]  2036 Patient is up and feeling better.  Her heart  rate is back to what her baseline is in the low 100s.  She will ambulate to the bathroom without any difficulty.  I discussed her AKI and likely dehydration.  Patient reports understanding.  Will prescribe Zofran at home and ordered for her to stay well-hydrated.  Given discharge instructions return precautions, all questions answered to patient satisfaction. [HN]    Clinical Course User Index [HN] Audley Hose, MD    Dispo: DC ------------------------------- Cindee Lame, MD Emergency Medicine  This note was created using dictation software, which may contain spelling or grammatical errors.   Audley Hose, MD 08/14/22 2038

## 2022-08-14 NOTE — Discharge Instructions (Signed)
Thank you for coming to [facility]. You were seen for low blood pressure. We did an exam, labs, and imaging, and these showed a renal injury and low blood pressure as well as high heart rate, all of which are consistent with severe dehydration.  Please drink plenty of fluids at home.  We have prescribed Zofran under the tongue 4 mg every 6-8 hours as needed for nausea in order to stay well-hydrated..  Please follow up with your primary care provider within 1 week.   Do not hesitate to return to the ED or call 911 if you experience: -Worsening symptoms -Nausea/vomiting so severe you cannot eat/drink anything -Lightheadedness, passing out -Fevers/chills -Anything else that concerns you

## 2022-08-14 NOTE — ED Notes (Signed)
In and out cath for urine sample, tech Louretta Shorten at bedside to observe sterile technique.

## 2022-08-14 NOTE — ED Provider Notes (Incomplete)
[place] Provider Note   CSN: [id number] Arrival date & time: 08/14/22  1352     History {Add pertinent medical, surgical, social history, OB history to HPI:1} Chief Complaint  Patient presents with   Hypotension    Cheryl Lee is a 77 y.o. female.  HPI 77 year old female history of choledocholelithiasis, lumbar compression fracture, presents today from preop due to tachycardia and hypotension.  Hypotension of systolic in the 123XX123 with heart rate up to 120s noted there.  Patient has had some generalized weakness and decreased p.o. intake due to nausea and pain from the gallbladder problems.  Continue to take her usual blood pressure medications.  She has not had any episodes of syncope, fever, head ache, head pain, chest pain, cough, dyspnea, abdominal pain, or UTI symptoms.     Home Medications Prior to Admission medications   Medication Sig Start Date End Date Taking? Authorizing Provider  acetaminophen (TYLENOL) 650 MG CR tablet Take 1,300 mg by mouth every 8 (eight) hours as needed for pain.    [provider]  alendronate (FOSAMAX) 70 MG tablet Take 1 tablet (70 mg total) by mouth every Monday. Take with a full glass of water on an empty stomach. 06/18/22   Evelina Dun A, FNP  Ascorbic Acid (VITAMIN C) 500 MG CAPS Take 500 mg by mouth daily.    [provider]  aspirin 81 MG tablet Take 1 tablet (81 mg total) by mouth daily. 01/22/20   Rehman, Mechele Dawley, MD  atorvastatin (LIPITOR) 10 MG tablet Take 1 tablet (10 mg total) by mouth daily. 06/15/22   Evelina Dun A, FNP  calcitonin, salmon, (MIACALCIN/FORTICAL) 200 UNIT/ACT nasal spray Place 1 spray into alternate nostrils daily. 07/07/22   Karmen Bongo, MD  fluticasone (FLONASE) 50 MCG/ACT nasal spray Place 2 sprays into both nostrils daily. Patient taking differently: Place 2 sprays into both nostrils in the morning and at bedtime. 06/15/22   Sharion Balloon, FNP   HYDROcodone-acetaminophen (NORCO/VICODIN) 5-325 MG tablet Take 1 tablet by mouth every 6 (six) hours as needed for moderate pain. 07/07/22   Karmen Bongo, MD  lidocaine (HM LIDOCAINE PATCH) 4 % Place 1 patch onto the skin daily. Patient not taking: Reported on 08/10/2022 06/30/22   Leath-Warren, Alda Lea, NP  lisinopril-hydrochlorothiazide (ZESTORETIC) 20-12.5 MG tablet Take 0.5 tablets by mouth daily. 06/15/22   Sharion Balloon, FNP  Multiple Vitamins-Minerals (MULTIVITAMINS THER. W/MINERALS) TABS Take 1 tablet by mouth daily.    [provider]  omeprazole (PRILOSEC) 20 MG capsule Take 1 capsule (20 mg total) by mouth daily. 06/15/22   Sharion Balloon, FNP  Polyethyl Glycol-Propyl Glycol (SYSTANE) 0.4-0.3 % SOLN Place 1 drop into both eyes daily as needed (Dry eye).    [provider]  trolamine salicylate (ASPERCREME) 10 % cream Apply 1 Application topically daily as needed for muscle pain (cramps).    [provider]  Vitamin D, Ergocalciferol, (DRISDOL) 1.25 MG (50000 UNIT) CAPS capsule Take 1 capsule (50,000 Units total) by mouth every 7 (seven) days. 07/19/22   Sharion Balloon, FNP      Allergies    Lycopene    Review of Systems   Review of Systems  Physical Exam Updated Vital Signs BP 104/64   Pulse (!) 115   Temp 97.8 F (36.6 C) (Oral)   Resp (!) 22   LMP 08/05/1991   SpO2 97%  Physical Exam Vitals reviewed.  HENT:  Head: Normocephalic.     Right Ear: External ear normal.     Left Ear: External ear normal.     Nose: Nose normal.     Mouth/Throat:     Pharynx: Oropharynx is clear.  Eyes:     Extraocular Movements: Extraocular movements intact.     Pupils: Pupils are equal, round, and reactive to light.  Cardiovascular:     Rate and Rhythm: Tachycardia present.  Pulmonary:     Effort: Pulmonary effort is normal.     Breath sounds: Normal breath sounds.  Abdominal:     General: Abdomen is flat. Bowel sounds are normal.     Palpations:  Abdomen is soft.  Musculoskeletal:        General: Normal range of motion.     Cervical back: Normal range of motion.  Skin:    General: Skin is warm.     Capillary Refill: Capillary refill takes less than 2 seconds.  Neurological:     General: No focal deficit present.     Mental Status: She is alert.     ED Results / Procedures / Treatments   Labs (all labs ordered are listed, but only abnormal results are displayed) Labs Reviewed  CULTURE, BLOOD (ROUTINE X 2)  CULTURE, BLOOD (ROUTINE X 2)  LACTIC ACID, PLASMA  LACTIC ACID, PLASMA  COMPREHENSIVE METABOLIC PANEL  CBC WITH DIFFERENTIAL/PLATELET  PROTIME-INR  APTT  URINALYSIS, W/ REFLEX TO CULTURE (INFECTION SUSPECTED)    EKG None  Radiology No results found.  Procedures Procedures  {Document cardiac monitor, telemetry assessment procedure when appropriate:1}  Medications Ordered in ED Medications  lactated ringers bolus 1,000 mL (has no administration in time range)    ED Course/ Medical Decision Making/ A&P Clinical Course as of 08/14/22 1537  Tue Aug 14, 2022  1512 Received signout from Dr. Jeanell Sparrow. Went to preop for vertebral fracture and found to be hypotensive to 123XX123 systolic and tachycardic. Pending sepsis w/u.  [HN]  1514 DG Chest Port 1 View No focal airspace opacity. [HN]  1535 Lactic Acid, Venous: 1.7 [HN]    Clinical Course User Index [HN] Audley Hose, MD   {   Click here for ABCD2, HEART and other calculatorsREFRESH Note before signing :1}                          Medical Decision Making Amount and/or Complexity of Data Reviewed Labs: ordered. Decision-making details documented in ED Course. Radiology: ordered. Decision-making details documented in ED Course. ECG/medicine tests: ordered.   ***  {Document critical care time when appropriate:1} {Document review of labs and clinical decision tools ie heart score, Chads2Vasc2 etc:1}  {Document your independent review of radiology images, and  any outside records:1} {Document your discussion with family members, caretakers, and with consultants:1} {Document social determinants of health affecting pt's care:1} {Document your decision making why or why not admission, treatments were needed:1} Final Clinical Impression(s) / ED Diagnoses Final diagnoses:  None    Rx / DC Orders ED Discharge Orders     None

## 2022-08-14 NOTE — Progress Notes (Signed)
For Short Stay: [place] appointment date:   Bowel Prep reminder:     For Anesthesia: PCP - Evelina Dun: FNP. LOV: 07/10/22 Cardiologist - N/A   Chest x-ray -  EKG - 08/14/22 Stress Test -  ECHO -  Cardiac Cath -  Pacemaker/ICD device last checked: Pacemaker orders received: Device Rep notified:   Spinal Cord Stimulator:N/A   Sleep Study - N/A CPAP -    Fasting Blood Sugar - N/A Checks Blood Sugar _____ times a day Date and result of last Hgb A1c-   Last dose of GLP1 agonist-  GLP1 instructions:    Last dose of SGLT-2 inhibitors-  SGLT-2 instructions:    Blood Thinner Instructions: Aspirin Instructions: Last Dose:   Activity level: Can go up a flight of stairs and activities of daily living without stopping and without chest pain and/or shortness of breath                         Able to exercise without chest pain and/or shortness of breath                         Unable to go up a flight of stairs without  shortness of breath                          Anesthesia review: Hx: HTN   Patient denies shortness of breath, fever, cough and chest pain at PAT appointment     Patient verbalized understanding of instructions that were given to them at the PAT appointment. Patient was also instructed that they will need to review over the PAT instructions again at home before surgery.

## 2022-08-17 ENCOUNTER — Telehealth: Payer: Self-pay

## 2022-08-17 NOTE — Telephone Encounter (Signed)
        Patient  visited [facility] on 08/14/2022  for hypotension.   Telephone encounter attempt :  1st  A HIPAA compliant voice message was left requesting a return call.  Instructed patient to call back at [phone].   [facility]  [facility] Population Health Community Resource Care Guide   [email or web address]  ?? [phone]   Website: [email or web address]  [email or web address]

## 2022-08-19 LAB — CULTURE, BLOOD (ROUTINE X 2)
Culture: NO GROWTH
Culture: NO GROWTH
Special Requests: ADEQUATE
Special Requests: ADEQUATE

## 2022-08-20 ENCOUNTER — Telehealth: Payer: Self-pay

## 2022-08-20 NOTE — Telephone Encounter (Signed)
        Patient  visited [facility] on 08/14/2022  for hypotension.   Telephone encounter attempt :  2nd  A HIPAA compliant voice message was left requesting a return call.  Instructed patient to call back at [phone].   [facility]  [facility] Population Health Community Resource Care Guide   [name]  ?? [phone]   Website: [email or web address]  [email or web address]

## 2022-08-23 ENCOUNTER — Encounter (HOSPITAL_COMMUNITY): Payer: Self-pay | Admitting: Surgery

## 2022-08-23 ENCOUNTER — Ambulatory Visit (HOSPITAL_COMMUNITY): Payer: Medicare Other | Admitting: Physician Assistant

## 2022-08-23 ENCOUNTER — Encounter (HOSPITAL_COMMUNITY)
Admission: RE | Disposition: A | Discharge status: Home / Self Care | Payer: Self-pay | Source: Home / Self Care | Attending: Surgery

## 2022-08-23 ENCOUNTER — Ambulatory Visit (HOSPITAL_BASED_OUTPATIENT_CLINIC_OR_DEPARTMENT_OTHER): Payer: Medicare Other | Admitting: Certified Registered"

## 2022-08-23 ENCOUNTER — Ambulatory Visit (HOSPITAL_COMMUNITY): Admit: 2022-08-23 | Payer: BLUE CROSS/BLUE SHIELD | Admitting: Gastroenterology

## 2022-08-23 ENCOUNTER — Other Ambulatory Visit: Payer: Self-pay

## 2022-08-23 ENCOUNTER — Ambulatory Visit (HOSPITAL_COMMUNITY): Payer: Medicare Other

## 2022-08-23 ENCOUNTER — Encounter (HOSPITAL_COMMUNITY): Payer: Self-pay

## 2022-08-23 ENCOUNTER — Ambulatory Visit (HOSPITAL_COMMUNITY)
Admission: RE | Admit: 2022-08-23 | Discharge: 2022-08-23 | Disposition: A | Discharge status: Home / Self Care | Payer: Medicare Other | Attending: Surgery | Admitting: Surgery

## 2022-08-23 DIAGNOSIS — K838 Other specified diseases of biliary tract: Secondary | ICD-10-CM | POA: Diagnosis not present

## 2022-08-23 DIAGNOSIS — I10 Essential (primary) hypertension: Secondary | ICD-10-CM | POA: Insufficient documentation

## 2022-08-23 DIAGNOSIS — K8010 Calculus of gallbladder with chronic cholecystitis without obstruction: Secondary | ICD-10-CM | POA: Diagnosis not present

## 2022-08-23 DIAGNOSIS — M1990 Unspecified osteoarthritis, unspecified site: Secondary | ICD-10-CM | POA: Diagnosis not present

## 2022-08-23 DIAGNOSIS — K219 Gastro-esophageal reflux disease without esophagitis: Secondary | ICD-10-CM | POA: Insufficient documentation

## 2022-08-23 DIAGNOSIS — K8070 Calculus of gallbladder and bile duct without cholecystitis without obstruction: Secondary | ICD-10-CM | POA: Diagnosis not present

## 2022-08-23 DIAGNOSIS — K8050 Calculus of bile duct without cholangitis or cholecystitis without obstruction: Secondary | ICD-10-CM | POA: Diagnosis not present

## 2022-08-23 DIAGNOSIS — K449 Diaphragmatic hernia without obstruction or gangrene: Secondary | ICD-10-CM | POA: Diagnosis not present

## 2022-08-23 DIAGNOSIS — K8060 Calculus of gallbladder and bile duct with cholecystitis, unspecified, without obstruction: Secondary | ICD-10-CM | POA: Insufficient documentation

## 2022-08-23 DIAGNOSIS — K2950 Unspecified chronic gastritis without bleeding: Secondary | ICD-10-CM | POA: Insufficient documentation

## 2022-08-23 DIAGNOSIS — K3189 Other diseases of stomach and duodenum: Secondary | ICD-10-CM | POA: Diagnosis not present

## 2022-08-23 HISTORY — PX: REMOVAL OF STONES: SHX5545

## 2022-08-23 HISTORY — PX: ERCP: SHX5425

## 2022-08-23 HISTORY — PX: BIOPSY: SHX5522

## 2022-08-23 HISTORY — PX: CHOLECYSTECTOMY: SHX55

## 2022-08-23 HISTORY — PX: SPHINCTEROTOMY: SHX5544

## 2022-08-23 LAB — BASIC METABOLIC PANEL
Anion gap: 8 (ref 5–15)
BUN: 14 mg/dL (ref 8–23)
CO2: 25 mmol/L (ref 22–32)
Calcium: 8.8 mg/dL — ABNORMAL LOW (ref 8.9–10.3)
Chloride: 104 mmol/L (ref 98–111)
Creatinine, Ser: 0.91 mg/dL (ref 0.44–1.00)
GFR, Estimated: >60 mL/min (ref >60)
Glucose, Bld: 99 mg/dL (ref 70–99)
Potassium: 4.3 mmol/L (ref 3.5–5.1)
Sodium: 137 mmol/L (ref 135–145)

## 2022-08-23 SURGERY — UPPER ENDOSCOPIC ULTRASOUND (EUS) RADIAL
Anesthesia: Monitor Anesthesia Care

## 2022-08-23 SURGERY — LAPAROSCOPIC CHOLECYSTECTOMY
Anesthesia: General

## 2022-08-23 MED ORDER — PROPOFOL 10 MG/ML IV BOLUS
INTRAVENOUS | Status: AC
  Filled 2022-08-23: qty 20

## 2022-08-23 MED ORDER — CHLORHEXIDINE GLUCONATE 0.12 % MT SOLN
15.0000 mL | Freq: Once | OROMUCOSAL | Status: AC
  Administered 2022-08-23: 15 mL via OROMUCOSAL

## 2022-08-23 MED ORDER — LIDOCAINE HCL (PF) 2 % IJ SOLN
INTRAMUSCULAR | Status: AC
  Filled 2022-08-23: qty 5

## 2022-08-23 MED ORDER — PHENYLEPHRINE HCL (PRESSORS) 10 MG/ML IV SOLN
INTRAVENOUS | Status: DC | PRN
  Administered 2022-08-23: 160 ug via INTRAVENOUS
  Administered 2022-08-23: 80 ug via INTRAVENOUS
  Administered 2022-08-23 (×2): 160 ug via INTRAVENOUS
  Administered 2022-08-23: 80 ug via INTRAVENOUS
  Administered 2022-08-23: 160 ug via INTRAVENOUS

## 2022-08-23 MED ORDER — CEFAZOLIN SODIUM-DEXTROSE 2-4 GM/100ML-% IV SOLN
2.0000 g | INTRAVENOUS | Status: AC
  Administered 2022-08-23: 2 g via INTRAVENOUS
  Filled 2022-08-23: qty 100

## 2022-08-23 MED ORDER — ROCURONIUM BROMIDE 10 MG/ML (PF) SYRINGE
PREFILLED_SYRINGE | INTRAVENOUS | Status: AC
  Filled 2022-08-23: qty 10

## 2022-08-23 MED ORDER — EPHEDRINE SULFATE-NACL 50-0.9 MG/10ML-% IV SOSY
PREFILLED_SYRINGE | INTRAVENOUS | Status: DC | PRN
  Administered 2022-08-23: 10 mg via INTRAVENOUS
  Administered 2022-08-23: 15 mg via INTRAVENOUS

## 2022-08-23 MED ORDER — LACTATED RINGERS IV SOLN: INTRAVENOUS | Status: DC

## 2022-08-23 MED ORDER — DEXAMETHASONE SODIUM PHOSPHATE 10 MG/ML IJ SOLN
INTRAMUSCULAR | Status: AC
  Filled 2022-08-23: qty 1

## 2022-08-23 MED ORDER — GLUCAGON HCL RDNA (DIAGNOSTIC) 1 MG IJ SOLR
INTRAMUSCULAR | Status: DC | PRN
  Administered 2022-08-23 (×2): .25 mg via INTRAVENOUS

## 2022-08-23 MED ORDER — INDOMETHACIN 50 MG RE SUPP
RECTAL | Status: DC | PRN
  Administered 2022-08-23: 100 mg via RECTAL

## 2022-08-23 MED ORDER — ACETAMINOPHEN 500 MG PO TABS: 1000.0000 mg | ORAL_TABLET | Freq: Once | ORAL | Status: DC

## 2022-08-23 MED ORDER — LIDOCAINE 2% (20 MG/ML) 5 ML SYRINGE
INTRAMUSCULAR | Status: DC | PRN
  Administered 2022-08-23: 50 mg via INTRAVENOUS

## 2022-08-23 MED ORDER — GLUCAGON HCL RDNA (DIAGNOSTIC) 1 MG IJ SOLR
INTRAMUSCULAR | Status: AC
  Filled 2022-08-23: qty 2

## 2022-08-23 MED ORDER — TRAMADOL HCL 50 MG PO TABS: 50.0000 mg | ORAL_TABLET | Freq: Four times a day (QID) | ORAL | 0 refills | Status: AC | PRN

## 2022-08-23 MED ORDER — OXYCODONE HCL 5 MG PO TABS: 5.0000 mg | ORAL_TABLET | Freq: Once | ORAL | Status: DC | PRN

## 2022-08-23 MED ORDER — ROCURONIUM BROMIDE 10 MG/ML (PF) SYRINGE
PREFILLED_SYRINGE | INTRAVENOUS | Status: DC | PRN
  Administered 2022-08-23: 20 mg via INTRAVENOUS
  Administered 2022-08-23: 50 mg via INTRAVENOUS

## 2022-08-23 MED ORDER — ORAL CARE MOUTH RINSE: 15.0000 mL | Freq: Once | OROMUCOSAL | Status: AC

## 2022-08-23 MED ORDER — OXYCODONE HCL 5 MG/5ML PO SOLN: 5.0000 mg | Freq: Once | ORAL | Status: DC | PRN

## 2022-08-23 MED ORDER — BUPIVACAINE HCL 0.25 % IJ SOLN
INTRAMUSCULAR | Status: AC
  Filled 2022-08-23: qty 1

## 2022-08-23 MED ORDER — FENTANYL CITRATE (PF) 100 MCG/2ML IJ SOLN
INTRAMUSCULAR | Status: AC
  Filled 2022-08-23: qty 2

## 2022-08-23 MED ORDER — FENTANYL CITRATE (PF) 100 MCG/2ML IJ SOLN
INTRAMUSCULAR | Status: DC | PRN
  Administered 2022-08-23: 50 ug via INTRAVENOUS
  Administered 2022-08-23 (×2): 25 ug via INTRAVENOUS

## 2022-08-23 MED ORDER — CIPROFLOXACIN IN D5W 400 MG/200ML IV SOLN
INTRAVENOUS | Status: AC
  Filled 2022-08-23: qty 200

## 2022-08-23 MED ORDER — PROPOFOL 10 MG/ML IV BOLUS
INTRAVENOUS | Status: DC | PRN
  Administered 2022-08-23: 80 mg via INTRAVENOUS

## 2022-08-23 MED ORDER — DEXAMETHASONE SODIUM PHOSPHATE 10 MG/ML IJ SOLN
INTRAMUSCULAR | Status: DC | PRN
  Administered 2022-08-23: 5 mg via INTRAVENOUS

## 2022-08-23 MED ORDER — ONDANSETRON HCL 4 MG/2ML IJ SOLN: 4.0000 mg | Freq: Once | INTRAMUSCULAR | Status: DC | PRN

## 2022-08-23 MED ORDER — ACETAMINOPHEN 500 MG PO TABS
1000.0000 mg | ORAL_TABLET | ORAL | Status: AC
  Administered 2022-08-23: 1000 mg via ORAL
  Filled 2022-08-23: qty 2

## 2022-08-23 MED ORDER — PHENYLEPHRINE HCL-NACL 20-0.9 MG/250ML-% IV SOLN
INTRAVENOUS | Status: AC
  Filled 2022-08-23: qty 250

## 2022-08-23 MED ORDER — BUPIVACAINE HCL (PF) 0.25 % IJ SOLN
INTRAMUSCULAR | Status: DC | PRN
  Administered 2022-08-23: 30 mL

## 2022-08-23 MED ORDER — PHENYLEPHRINE 80 MCG/ML (10ML) SYRINGE FOR IV PUSH (FOR BLOOD PRESSURE SUPPORT)
PREFILLED_SYRINGE | INTRAVENOUS | Status: AC
  Filled 2022-08-23: qty 10

## 2022-08-23 MED ORDER — FENTANYL CITRATE PF 50 MCG/ML IJ SOSY: 25.0000 ug | PREFILLED_SYRINGE | INTRAMUSCULAR | Status: DC | PRN

## 2022-08-23 MED ORDER — BUPIVACAINE-EPINEPHRINE (PF) 0.5% -1:200000 IJ SOLN
INTRAMUSCULAR | Status: AC
  Filled 2022-08-23: qty 30

## 2022-08-23 MED ORDER — SODIUM CHLORIDE 0.9 % IV SOLN
INTRAVENOUS | Status: DC | PRN
  Administered 2022-08-23: 50 mL

## 2022-08-23 MED ORDER — SUGAMMADEX SODIUM 200 MG/2ML IV SOLN
INTRAVENOUS | Status: DC | PRN
  Administered 2022-08-23: 200 mg via INTRAVENOUS

## 2022-08-23 MED ORDER — ONDANSETRON HCL 4 MG/2ML IJ SOLN
INTRAMUSCULAR | Status: AC
  Filled 2022-08-23: qty 2

## 2022-08-23 MED ORDER — INDOMETHACIN 50 MG RE SUPP
RECTAL | Status: AC
  Filled 2022-08-23: qty 2

## 2022-08-23 SURGICAL SUPPLY — 47 items
ADH SKN CLS APL DERMABOND .7 (GAUZE/BANDAGES/DRESSINGS) ×2
APL PRP STRL LF DISP 70% ISPRP (MISCELLANEOUS) ×2
APPLIER CLIP 5 13 M/L LIGAMAX5 (MISCELLANEOUS) ×2
APR CLP MED LRG 5 ANG JAW (MISCELLANEOUS) ×2
BAG COUNTER SPONGE SURGICOUNT (BAG) IMPLANT
BAG SPEC RTRVL 10 TROC 200 (ENDOMECHANICALS)
BAG SPNG CNTER NS LX DISP (BAG)
CHLORAPREP W/TINT 26 (MISCELLANEOUS) ×2 IMPLANT
CLIP APPLIE 5 13 M/L LIGAMAX5 (MISCELLANEOUS) ×2 IMPLANT
COVER MAYO STAND XLG (MISCELLANEOUS) ×2 IMPLANT
COVER SURGICAL LIGHT HANDLE (MISCELLANEOUS) ×2 IMPLANT
DERMABOND ADVANCED .7 DNX12 (GAUZE/BANDAGES/DRESSINGS) ×2 IMPLANT
DRAPE C-ARM 42X120 X-RAY (DRAPES) IMPLANT
ELECT L-HOOK LAP 45CM DISP (ELECTROSURGICAL)
ELECT PENCIL ROCKER SW 15FT (MISCELLANEOUS) ×2 IMPLANT
ELECT REM PT RETURN 15FT ADLT (MISCELLANEOUS) ×2 IMPLANT
ELECTRODE L-HOOK LAP 45CM DISP (ELECTROSURGICAL) IMPLANT
GLOVE BIOGEL PI IND STRL 6 (GLOVE) ×2 IMPLANT
GLOVE BIOGEL PI MICRO STRL 5.5 (GLOVE) ×2 IMPLANT
GOWN STRL REUS W/ TWL LRG LVL3 (GOWN DISPOSABLE) ×2 IMPLANT
GOWN STRL REUS W/TWL LRG LVL3 (GOWN DISPOSABLE) ×2
GRASPER SUT TROCAR 14GX15 (MISCELLANEOUS) IMPLANT
HEMOSTAT SNOW SURGICEL 2X4 (HEMOSTASIS) IMPLANT
IRRIG SUCT STRYKERFLOW 2 WTIP (MISCELLANEOUS) ×2
IRRIGATION SUCT STRKRFLW 2 WTP (MISCELLANEOUS) ×2 IMPLANT
KIT BASIN OR (CUSTOM PROCEDURE TRAY) ×2 IMPLANT
KIT TURNOVER KIT A (KITS) IMPLANT
L-HOOK LAP DISP 36CM (ELECTROSURGICAL) ×2
LHOOK LAP DISP 36CM (ELECTROSURGICAL) ×2 IMPLANT
NDL INSUFFLATION 14GA 120MM (NEEDLE) IMPLANT
NEEDLE INSUFFLATION 14GA 120MM (NEEDLE) IMPLANT
POUCH RETRIEVAL ECOSAC 10 (ENDOMECHANICALS) IMPLANT
POUCH RETRIEVAL ECOSAC 10MM (ENDOMECHANICALS)
SCISSORS LAP 5X35 DISP (ENDOMECHANICALS) ×2 IMPLANT
SET CHOLANGIOGRAPH MIX (MISCELLANEOUS) IMPLANT
SET TUBE SMOKE EVAC HIGH FLOW (TUBING) ×2 IMPLANT
SLEEVE Z-THREAD 5X100MM (TROCAR) ×4 IMPLANT
SPIKE FLUID TRANSFER (MISCELLANEOUS) ×2 IMPLANT
SUT MNCRL AB 4-0 PS2 18 (SUTURE) ×2 IMPLANT
SYS BAG RETRIEVAL 10MM (BASKET) ×2
SYSTEM BAG RETRIEVAL 10MM (BASKET) IMPLANT
TOWEL OR 17X26 10 PK STRL BLUE (TOWEL DISPOSABLE) ×2 IMPLANT
TOWEL OR NON WOVEN STRL DISP B (DISPOSABLE) IMPLANT
TRAY LAPAROSCOPIC (CUSTOM PROCEDURE TRAY) ×2 IMPLANT
TROCAR ADV FIXATION 12X100MM (TROCAR) IMPLANT
TROCAR BALLN 12MMX100 BLUNT (TROCAR) IMPLANT
TROCAR Z-THREAD OPTICAL 5X100M (TROCAR) ×2 IMPLANT

## 2022-08-23 NOTE — Anesthesia Postprocedure Evaluation (Signed)
Anesthesia Post Note  Patient: Cheryl Lee  Procedure(s) Performed: LAPAROSCOPIC CHOLECYSTECTOMY ENDOSCOPIC RETROGRADE CHOLANGIOPANCREATOGRAPHY (ERCP) SPHINCTEROTOMY REMOVAL OF STONES BIOPSY     Patient location during evaluation: PACU Anesthesia Type: General Level of consciousness: awake and alert, oriented and patient cooperative Pain management: pain level controlled Vital Signs Assessment: post-procedure vital signs reviewed and stable Respiratory status: spontaneous breathing, nonlabored ventilation and respiratory function stable Cardiovascular status: blood pressure returned to baseline and stable Postop Assessment: no apparent nausea or vomiting Anesthetic complications: no   No notable events documented.  Last Vitals:  Vitals:   08/23/22 1430 08/23/22 1445  BP: (!) 143/81 (!) 142/85  Pulse: 86 86  Resp: (!) 27 (!) 25  Temp:  (!) 36.4 C  SpO2: 93% 92%    Last Pain:  Vitals:   08/23/22 1445  TempSrc:   PainSc: 0-No pain                 Lannie Fields

## 2022-08-23 NOTE — Op Note (Signed)
[facility] Patient Name: Cheryl Lee Procedure Date: 08/23/2022 MRN: [id number] Attending MD: Corliss Parish , MD, [id number] Date of Birth: [birth year] CSN: [id number] Age: 77 Admit Type: Outpatient Procedure:                ERCP Indications:              Bile duct stone(s), Abnormal MRCP Providers:                Corliss Parish, MD, Zoe Lan, RN, Harrington Challenger, Technician Referring MD:             Ree Kida. Doralee Albino A. Carmelina Dane                            MD, MD Medicines:                General Anesthesia, Ancef 2000 mg IV, Indomethacin                            100 mg PR, Glucagon 0.5 mg IV Complications:            No immediate complications. Estimated Blood Loss:     Estimated blood loss was minimal. Procedure:                Pre-Anesthesia Assessment:                           - Prior to the procedure, a History and Physical                            was performed, and patient medications and                            allergies were reviewed. The patient's tolerance of                            previous anesthesia was also reviewed. The risks                            and benefits of the procedure and the sedation                            options and risks were discussed with the patient.                            All questions were answered, and informed consent                            was obtained. Prior Anticoagulants: The patient has                            taken no anticoagulant or antiplatelet agents  except for aspirin. ASA Grade Assessment: III - A                            patient with severe systemic disease. After                            reviewing the risks and benefits, the patient was                            deemed in satisfactory condition to undergo the                            procedure.                           After obtaining informed  consent, the scope was                            passed under direct vision. Throughout the                            procedure, the patient's blood pressure, pulse, and                            oxygen saturations were monitored continuously. The                            TJF-Q190V ([id number]) Olympus duodenoscope was                            introduced through the mouth, and used to inject                            contrast into and used to inject contrast into the                            bile duct. The ERCP was accomplished without                            difficulty. The patient tolerated the procedure. Findings:      The scout film was normal.      The upper GI tract was traversed under direct vision without detailed       examination. A 2 cm hiatal hernia was present. Multiple dispersed small       erosions with no bleeding and no stigmata of recent bleeding were found       in the entire examined stomach. Biopsies were obtained to rule out H.       pylori infection. No gross lesions were noted in the duodenal bulb, in       the first portion of the duodenum and in the second portion of the       duodenum. The major papilla was located entirely within a diverticulum.      Going into a long position, I was able to visualize and get a more  stable look at the major papilla. Subsequently a short 0.035 inch Soft       Jagwire was passed into the biliary tree. The Hydratome sphincterotome       was passed over the guidewire and the bile duct was then deeply       cannulated. Contrast was injected. I personally interpreted the bile       duct images. Ductal flow of contrast was adequate. Image quality was       adequate. Contrast extended to the hepatic ducts. Opacification of the       entire biliary tree except for the gallbladder was successful. The lower       third of the main bile duct and middle third of the main bile duct were       moderately dilated. The largest  diameter was 16 mm. The lower third of       the main bile duct contained filling defects thought to be a stone and       sludge. A 7 mm biliary sphincterotomy was made with a monofilament       Hydratome sphincterotome using ERBE electrocautery. There was no       post-sphincterotomy bleeding. I took care to try to minimize doing too       large of a sphincterotomy or sphincteroplasty as a result of this       papilla being entirely intra diverticular. To discover objects, the       biliary tree was swept with multiple retrieval balloons. Sludge was       swept from the duct. Two stones were removed. No stones remained. An       occlusion cholangiogram was performed that showed no further significant       biliary pathology.      A pancreatogram was not performed.      The duodenoscope was withdrawn from the patient. Impression:               - 2 cm hiatal hernia.                           - Erosive gastropathy with no bleeding and no                            stigmata of recent bleeding. Biopsied to rule out                            H. pylori.                           - No gross lesions in the duodenal bulb, in the                            first portion of the duodenum and in the second                            portion of the duodenum.                           - The major papilla was located entirely within a  diverticulum.                           - Filling defects consistent with a stone and                            sludge was seen on the cholangiogram.                           - The middle third of the main bile duct and lower                            third of the main bile duct were moderately dilated.                           - Choledocholithiasis was found. Complete removal                            was accomplished by biliary sphincterotomy and                            balloon sweeping.                           - The cystic duct  began to fill the gallbladder did                            not fill. Moderate Sedation:      Not Applicable - Patient had care per Anesthesia. Recommendation:           - Proceed to scheduled cholecystectomy today.                           - Diet as per Surgical service.                           - Observe patient's clinical course.                           - Check liver enzymes (AST, ALT, alkaline                            phosphatase, bilirubin) in 2 weeks.                           - Watch for pancreatitis, bleeding, perforation,                            and cholangitis.                           - Continue current PPI dosing.                           - Await path results.                           -  The findings and recommendations were discussed                            with the patient's family.                           - The findings and recommendations were discussed                            with the referring physician. Procedure Code(s):        --- Professional ---                           [phone], Endoscopic retrograde                            cholangiopancreatography (ERCP); with removal of                            calculi/debris from biliary/pancreatic duct(s)                           43262, Endoscopic retrograde                            cholangiopancreatography (ERCP); with                            sphincterotomy/papillotomy                           74328, 26, Endoscopic catheterization of the                            biliary ductal system, radiological supervision and                            interpretation Diagnosis Code(s):        --- Professional ---                           K44.9, Diaphragmatic hernia without obstruction or                            gangrene                           K31.89, Other diseases of stomach and duodenum                           R93.2, Abnormal findings on diagnostic imaging of                            liver  and biliary tract                           K80.50, Calculus of bile duct without cholangitis  or cholecystitis without obstruction                           K83.8, Other specified diseases of biliary tract CPT copyright 2022 American Medical Association. All rights reserved. The codes documented in this report are preliminary and upon coder review may  be revised to meet current compliance requirements. Corliss Parish, MD 08/23/2022 1:24:04 PM Number of Addenda: 0

## 2022-08-23 NOTE — H&P (Signed)
GASTROENTEROLOGY PROCEDURE H&P NOTE   Primary Care Physician: Junie Spencer, FNP  HPI: Cheryl Lee is a 77 y.o. female who presents for ERCP with combined Cholecystectomy in the OR for choledocholithiasis/cholelithiasis and biliary duct dilation.  Past Medical History:  Diagnosis Date   Acid reflux    Arthritis    Dyspnea    HNP (herniated nucleus pulposus), lumbar    Hypercholesteremia    Hypertension    Osteoporosis    Sinus congestion    Past Surgical History:  Procedure Laterality Date   CATARACT EXTRACTION     COLONOSCOPY     COLONOSCOPY N/A 01/21/2020   Procedure: COLONOSCOPY;  Surgeon: Malissa Hippo, MD;  Location: [facility];  Service: Endoscopy;  Laterality: N/A;  730   LUMBAR LAMINECTOMY/DECOMPRESSION MICRODISCECTOMY Left 05/01/2017   Procedure: Microdiscectomy - Lumbar four-five  - left;  Surgeon: Donalee Citrin, MD;  Location: [facility] OR;  Service: Neurosurgery;  Laterality: Left;   POLYPECTOMY  01/21/2020   Procedure: POLYPECTOMY;  Surgeon: Malissa Hippo, MD;  Location: [facility];  Service: Endoscopy;;   Right snkle repair     Current Facility-Administered Medications  Medication Dose Route Frequency Provider Last Rate Last Admin   acetaminophen (TYLENOL) tablet 1,000 mg  1,000 mg Oral On Call to OR Fritzi Mandes, MD       ceFAZolin (ANCEF) IVPB 2g/100 mL premix  2 g Intravenous On Call to OR Fritzi Mandes, MD       chlorhexidine (PERIDEX) 0.12 % solution 15 mL  15 mL Mouth/Throat Once Mal Amabile, MD       Or   Oral care mouth rinse  15 mL Mouth Rinse Once Mal Amabile, MD       lactated ringers infusion   Intravenous Continuous Mal Amabile, MD        Current Facility-Administered Medications:    acetaminophen (TYLENOL) tablet 1,000 mg, 1,000 mg, Oral, On Call to OR, Fritzi Mandes, MD   ceFAZolin (ANCEF) IVPB 2g/100 mL premix, 2 g, Intravenous, On Call to OR, Fritzi Mandes, MD   chlorhexidine (PERIDEX) 0.12 % solution 15 mL, 15  mL, Mouth/Throat, Once **OR** Oral care mouth rinse, 15 mL, Mouth Rinse, Once, Mal Amabile, MD   lactated ringers infusion, , Intravenous, Continuous, Mal Amabile, MD Allergies  Allergen Reactions   Lycopene Itching and Rash   Family History  Problem Relation Age of Onset   Cancer Mother    Alzheimer's disease Mother    Heart disease Mother    Early death Father    Diabetes Sister    Diabetes Sister    Cancer Sister 71       uterine   Hypertension Sister    Healthy Daughter    Stroke Brother    Breast cancer Neg Hx    Social History   Socioeconomic History   Marital status: Divorced    Spouse name: Not on file   Number of children: 1   Years of education: 12   Highest education level: 12th grade  Occupational History   Occupation: retired  Tobacco Use   Smoking status: Never   Smokeless tobacco: Never  Vaping Use   Vaping Use: Never used  Substance and Sexual Activity   Alcohol use: No   Drug use: No   Sexual activity: Not Currently  Other Topics Concern   Not on file  Social History Narrative   Lives alone. Daughter lives nearby   Brother lives next  door.   Social Determinants of Health   Financial Resource Strain: Low Risk  (01/06/2021)   Overall Financial Resource Strain (CARDIA)    Difficulty of Paying Living Expenses: Not hard at all  Food Insecurity: No Food Insecurity (07/09/2022)   Hunger Vital Sign    Worried About Running Out of Food in the Last Year: Never true    Ran Out of Food in the Last Year: Never true  Transportation Needs: No Transportation Needs (07/09/2022)   PRAPARE - Administrator, Civil Service (Medical): No    Lack of Transportation (Non-Medical): No  Physical Activity: Sufficiently Active (01/06/2021)   Exercise Vital Sign    Days of Exercise per Week: 6 days    Minutes of Exercise per Session: 30 min  Stress: No Stress Concern Present (01/06/2021)   [employer] of Occupational Health - Occupational Stress  Questionnaire    Feeling of Stress : Not at all  Social Connections: Moderately Integrated (01/06/2021)   Social Connection and Isolation Panel [NHANES]    Frequency of Communication with Friends and Family: More than three times a week    Frequency of Social Gatherings with Friends and Family: More than three times a week    Attends Religious Services: More than 4 times per year    Active Member of [employer] or Organizations: Yes    Attends Banker Meetings: More than 4 times per year    Marital Status: Divorced  Intimate Partner Violence: Not At Risk (01/06/2021)   Humiliation, Afraid, Rape, and Kick questionnaire    Fear of Current or Ex-Partner: No    Emotionally Abused: No    Physically Abused: No    Sexually Abused: No    Physical Exam: Today's Vitals   08/23/22 0930 08/23/22 1006  BP: 135/80   Pulse: 95   Resp: 18   Temp: 99 F (37.2 C)   TempSrc: Oral   SpO2: 99%   Weight:  59.4 kg  Height:  5' (1.524 m)  PainSc:  0-No pain   Body mass index is 25.58 kg/m. GEN: NAD EYE: Sclerae anicteric ENT: MMM CV: Non-tachycardic GI: Soft, NT/ND NEURO:  Alert & Oriented x 3  Lab Results: No results for input(s): "WBC", "HGB", "HCT", "PLT" in the last 72 hours. BMET No results for input(s): "NA", "K", "CL", "CO2", "GLUCOSE", "BUN", "CREATININE", "CALCIUM" in the last 72 hours. LFT No results for input(s): "PROT", "ALBUMIN", "AST", "ALT", "ALKPHOS", "BILITOT", "BILIDIR", "IBILI" in the last 72 hours. PT/INR No results for input(s): "LABPROT", "INR" in the last 72 hours.   Impression / Plan: This is a 77 y.o.female who presents for ERCP with combined Cholecystectomy in the OR for choledocholithiasis/cholelithiasis and biliary duct dilation.  The risks of an ERCP were discussed at length, including but not limited to the risk of perforation, bleeding, abdominal pain, post-ERCP pancreatitis (while usually mild can be severe and even life threatening).  The risks  and benefits of endoscopic evaluation/treatment were discussed with the patient and/or family; these include but are not limited to the risk of perforation, infection, bleeding, missed lesions, lack of diagnosis, severe illness requiring hospitalization, as well as anesthesia and sedation related illnesses.  The patient's history has been reviewed, patient examined, no change in status, and deemed stable for procedure.  The patient and/or family is agreeable to proceed.    Corliss Parish, MD [facility] Office # [phone]

## 2022-08-23 NOTE — Transfer of Care (Signed)
Immediate Anesthesia Transfer of Care Note  Patient: Cheryl Lee  Procedure(s) Performed: LAPAROSCOPIC CHOLECYSTECTOMY ENDOSCOPIC RETROGRADE CHOLANGIOPANCREATOGRAPHY (ERCP) SPHINCTEROTOMY REMOVAL OF STONES BIOPSY  Patient Location: PACU  Anesthesia Type:General  Level of Consciousness: drowsy and patient cooperative Report given to RN Airway & Oxygen Therapy: Patient Spontanous Breathing and Patient connected to face mask oxygen  Post-op Assessment: Report given to RN and Post -op Vital signs reviewed and stable  Post vital signs: Reviewed and stable  Last Vitals:  Vitals Value Taken Time  BP 154/84 08/23/22 1353  Temp    Pulse 90 08/23/22 1358  Resp 20 08/23/22 1358  SpO2 98 % 08/23/22 1358  Vitals shown include unvalidated device data.  Last Pain:  Vitals:   08/23/22 1006  TempSrc:   PainSc: 0-No pain         Complications: No notable events documented.

## 2022-08-23 NOTE — Anesthesia Preprocedure Evaluation (Addendum)
Anesthesia Evaluation  Patient identified by MRN, date of birth, ID band Patient awake    Reviewed: Allergy & Precautions, H&P , NPO status , Patient's Chart, lab work & pertinent test results  Airway Mallampati: III  TM Distance: >3 FB Neck ROM: Full    Dental  (+) Teeth Intact, Dental Advisory Given   Pulmonary neg pulmonary ROS   Pulmonary exam normal breath sounds clear to auscultation       Cardiovascular hypertension (135/80), Pt. on medications Normal cardiovascular exam Rhythm:Regular Rate:Normal     Neuro/Psych negative neurological ROS  negative psych ROS   GI/Hepatic Neg liver ROS,GERD  Medicated and Controlled,,  Endo/Other  negative endocrine ROS    Renal/GU Cr 2.5 on recent ED admission, thought to be AKI from dehydration- normal Cr on recheck today  negative genitourinary   Musculoskeletal  (+) Arthritis , Osteoarthritis,    Abdominal   Peds negative pediatric ROS (+)  Hematology negative hematology ROS (+)   Anesthesia Other Findings   Reproductive/Obstetrics negative OB ROS                             Anesthesia Physical Anesthesia Plan  ASA: 3  Anesthesia Plan: General   Post-op Pain Management: Tylenol PO (pre-op)*   Induction: Intravenous  PONV Risk Score and Plan: 3 and Ondansetron, Dexamethasone and Treatment may vary due to age or medical condition  Airway Management Planned: Oral ETT  Additional Equipment: None  Intra-op Plan:   Post-operative Plan: Extubation in OR  Informed Consent: I have reviewed the patients History and Physical, chart, labs and discussed the procedure including the risks, benefits and alternatives for the proposed anesthesia with the patient or authorized representative who has indicated his/her understanding and acceptance.     Dental advisory given  Plan Discussed with: CRNA  Anesthesia Plan Comments:         Anesthesia Quick Evaluation

## 2022-08-23 NOTE — Discharge Instructions (Addendum)
[facility] DISCHARGE INSTRUCTIONS  Activity No heavy lifting greater than 15 pounds for 4 weeks after surgery. Ok to shower in 24 hours, but do not bathe or submerge incisions underwater. Do not drive while taking narcotic pain medication.  Wound Care Your incisions are covered with skin glue called Dermabond. This will peel off on its own over time. You may shower and allow warm soapy water to run over your incisions. Gently pat dry. Do not submerge your incision underwater. Monitor your incision for any new redness, tenderness, or drainage.  When to Call us: Fever greater than 100.5 New redness, drainage, or swelling at incision site Severe pain, nausea, or vomiting Jaundice (yellowing of the whites of the eyes or skin)  Follow-up You have an appointment scheduled with Dr. Freida Busman on Sep 12, 2022 at 3:00pm. This will be at the [facility] Surgery office at [address], [place], [place]. Please arrive at least 15 minutes prior to your scheduled appointment time.  For questions or concerns, please call the office at [phone].

## 2022-08-23 NOTE — Interval H&P Note (Signed)
History and Physical Interval Note:  08/23/2022 11:39 AM  Cheryl Lee  has presented today for surgery, with the diagnosis of GALLSTONES, CHOLEDOCHOLITHIASIS.  The various methods of treatment have been discussed with the patient and family. After consideration of risks, benefits and other options for treatment, the patient has consented to  Procedure(s): LAPAROSCOPIC CHOLECYSTECTOMY (N/A) ENDOSCOPIC RETROGRADE CHOLANGIOPANCREATOGRAPHY (ERCP) (N/A) ESOPHAGOGASTRODUODENOSCOPY (EGD) (N/A) as a surgical intervention.  The patient's history has been reviewed, patient examined, no change in status, stable for surgery.  I have reviewed the patient's chart and labs.  Questions were answered to the patient's satisfaction.     Fritzi Mandes

## 2022-08-23 NOTE — Anesthesia Procedure Notes (Signed)
Procedure Name: Intubation Date/Time: 08/23/2022 12:05 PM  Performed by: Wynonia Sours, CRNAPre-anesthesia Checklist: Patient identified, Emergency Drugs available, Suction available, Patient being monitored and Timeout performed Patient Re-evaluated:Patient Re-evaluated prior to induction Oxygen Delivery Method: Circle system utilized Preoxygenation: Pre-oxygenation with 100% oxygen Induction Type: IV induction Ventilation: Mask ventilation without difficulty Laryngoscope Size: Mac and 3 Grade View: Grade I Tube type: Oral Tube size: 7.0 mm Number of attempts: 1 Airway Equipment and Method: Stylet Placement Confirmation: ETT inserted through vocal cords under direct vision, positive ETCO2, CO2 detector and breath sounds checked- equal and bilateral Secured at: 21 cm Tube secured with: Tape Dental Injury: Teeth and Oropharynx as per pre-operative assessment

## 2022-08-23 NOTE — Op Note (Signed)
Date: 08/23/22  Patient: Cheryl Lee MRN: [id number]  Preoperative Diagnosis: Cholelithiasis with choledocholithiasis Postoperative Diagnosis: Same  Procedure: Laparoscopic cholecystectomy  Surgeon: Sophronia Simas, MD  EBL: Minimal  Anesthesia: General endotracheal  Specimens: Gallbladder  Indications: Cheryl Lee is a 77 yo female who was admitted in February with pack pain secondary to a lumbar fracture. She was incidentally found to have a borderline dilated common bile duct. An MRCP showed choledocholithiasis, however her LFTs remained normal and she did not have abdominal pain. She opted for outpatient follow up with ERCP. I saw her in consultation as an outpatient, and recommended cholecystectomy. This was coordinated to be done concurrently with her ERCP for bile duct clearance. After an extensive discussion of the risks and benefits of surgery, she agreed to proceed.  Findings: No evidence of acute cholecystitis.  Procedure details: Informed consent was obtained in the preoperative area prior to the procedure. The patient was brought to the operating room and general anesthesia was induced. Prior to cholecystectomy, an ERCP was performed by Dr. Meridee Score; please see his separately dictated procedure note. Following the ERCP, the patient was placed in the supine position. Perioperative antibiotics were administered per SCIP guidelines. The abdomen was prepped and draped in the usual sterile fashion. A pre-procedure timeout was taken verifying patient identity, surgical site and procedure to be performed.  A small infraumbilical skin incision was made, the subcutaneous tissue was divided with cautery, and the umbilical stalk was grasped and elevated. The fascia was incised and the peritoneal cavity was directly visualized. A 30mm Hassan trocar was placed and the abdomen was insufflated. The peritoneal cavity was inspected with no evidence of visceral or vascular injury. Three 77mm ports  were placed in the right subcostal margin, all under direct visualization. The fundus of the gallbladder was grasped and retracted cephalad. There were minimal omental adhesions to the gallbladder, and adjacent liver, which were taken down with cautery to aid in retraction. The infundibulum of the gallbladder was retracted laterally. The peritoneum over the gallbladder was incised with cautery and the cystic triangle was dissected out using cautery and blunt dissection. The critical view of safety was obtained. The cystic duct and cystic artery were clipped and ligated, leaving two clips behind on the cystic duct stump. There was bleeding from a posterior branch of the cystic artery, which was controlled with placement of two clips. The gallbladder was taken off the liver using cautery, and the specimen was placed in an endocatch bag. The surgical site was irrigated with saline until the effluent was clear. The gallbladder fossa appeared hemostatic. The cystic duct and artery stumps were visually inspected and there was no evidence of bile leak or bleeding. The ports were removed under direct visualization and the abdomen was desufflated. The specimen was extracted via the umbilical port site. The umbilical port site fascia was closed with a 0 vicryl pursestring suture. The skin at all port sites was closed with 4-0 monocryl subcuticular suture. Dermabond was applied.  The patient tolerated the procedure well with no apparent complications. All counts were correct x2 at the end of the procedure. The patient was extubated and taken to PACU in stable condition.  Sophronia Simas, MD 08/23/22 1:48 PM

## 2022-08-24 ENCOUNTER — Encounter (HOSPITAL_COMMUNITY): Payer: Self-pay | Admitting: Surgery

## 2022-08-24 LAB — SURGICAL PATHOLOGY

## 2022-10-04 DIAGNOSIS — M5440 Lumbago with sciatica, unspecified side: Secondary | ICD-10-CM | POA: Diagnosis not present

## 2022-10-04 DIAGNOSIS — S32040A Wedge compression fracture of fourth lumbar vertebra, initial encounter for closed fracture: Secondary | ICD-10-CM | POA: Diagnosis not present

## 2022-11-01 DIAGNOSIS — M5440 Lumbago with sciatica, unspecified side: Secondary | ICD-10-CM | POA: Diagnosis not present

## 2022-11-01 DIAGNOSIS — S32040A Wedge compression fracture of fourth lumbar vertebra, initial encounter for closed fracture: Secondary | ICD-10-CM | POA: Diagnosis not present

## 2022-12-25 DIAGNOSIS — M5440 Lumbago with sciatica, unspecified side: Secondary | ICD-10-CM | POA: Diagnosis not present

## 2022-12-25 DIAGNOSIS — S32040A Wedge compression fracture of fourth lumbar vertebra, initial encounter for closed fracture: Secondary | ICD-10-CM | POA: Diagnosis not present

## 2022-12-25 DIAGNOSIS — Z6826 Body mass index (BMI) 26.0-26.9, adult: Secondary | ICD-10-CM | POA: Diagnosis not present

## 2022-12-27 DIAGNOSIS — M4856XA Collapsed vertebra, not elsewhere classified, lumbar region, initial encounter for fracture: Secondary | ICD-10-CM | POA: Diagnosis not present

## 2022-12-27 DIAGNOSIS — M5136 Other intervertebral disc degeneration, lumbar region: Secondary | ICD-10-CM | POA: Diagnosis not present

## 2022-12-27 DIAGNOSIS — M5440 Lumbago with sciatica, unspecified side: Secondary | ICD-10-CM | POA: Diagnosis not present

## 2022-12-27 DIAGNOSIS — M47816 Spondylosis without myelopathy or radiculopathy, lumbar region: Secondary | ICD-10-CM | POA: Diagnosis not present

## 2022-12-27 DIAGNOSIS — M5126 Other intervertebral disc displacement, lumbar region: Secondary | ICD-10-CM | POA: Diagnosis not present

## 2023-01-02 ENCOUNTER — Ambulatory Visit: Payer: Medicare Other | Attending: Neurosurgery

## 2023-01-02 ENCOUNTER — Other Ambulatory Visit: Payer: Self-pay

## 2023-01-02 DIAGNOSIS — M62830 Muscle spasm of back: Secondary | ICD-10-CM | POA: Insufficient documentation

## 2023-01-02 DIAGNOSIS — M5416 Radiculopathy, lumbar region: Secondary | ICD-10-CM | POA: Diagnosis not present

## 2023-01-02 DIAGNOSIS — M6281 Muscle weakness (generalized): Secondary | ICD-10-CM | POA: Diagnosis not present

## 2023-01-02 NOTE — Therapy (Signed)
OUTPATIENT PHYSICAL THERAPY THORACOLUMBAR EVALUATION   Patient Name: Cheryl Lee MRN: [id number] DOB:Apr 10, 1946, 77 y.o., female Today's Date: 01/02/2023  END OF SESSION:  PT End of Session - 01/02/23 0925     Visit Number 1    Number of Visits 12    Date for PT Re-Evaluation 03/08/23    PT Start Time 0931    PT Stop Time 1010    PT Time Calculation (min) 39 min    Activity Tolerance Patient tolerated treatment well    Behavior During Therapy [facility] for tasks assessed/performed             Past Medical History:  Diagnosis Date   Acid reflux    Arthritis    Dyspnea    HNP (herniated nucleus pulposus), lumbar    Hypercholesteremia    Hypertension    Osteoporosis    Sinus congestion    Past Surgical History:  Procedure Laterality Date   BIOPSY  08/23/2022   Procedure: BIOPSY;  Surgeon: Lemar Lofty., MD;  Location: [facility];  Service: Gastroenterology;;   CATARACT EXTRACTION     CHOLECYSTECTOMY N/A 08/23/2022   Procedure: LAPAROSCOPIC CHOLECYSTECTOMY;  Surgeon: Fritzi Mandes, MD;  Location: [facility];  Service: General;  Laterality: N/A;   COLONOSCOPY     COLONOSCOPY N/A 01/21/2020   Procedure: COLONOSCOPY;  Surgeon: Malissa Hippo, MD;  [facility];  Service: Endoscopy;  Laterality: N/A;  730   ERCP N/A 08/23/2022   Procedure: ENDOSCOPIC RETROGRADE CHOLANGIOPANCREATOGRAPHY (ERCP);  Surgeon: Lemar Lofty., MD;  Location: [facility];  Service: Gastroenterology;  Laterality: N/A;   LUMBAR LAMINECTOMY/DECOMPRESSION MICRODISCECTOMY Left 05/01/2017   Procedure: Microdiscectomy - Lumbar four-five  - left;  Surgeon: Donalee Citrin, MD;  Location: [facility];  Service: Neurosurgery;  Laterality: Left;   POLYPECTOMY  01/21/2020   Procedure: POLYPECTOMY;  Surgeon: Malissa Hippo, MD;  [facility];  Service: Endoscopy;;   REMOVAL OF STONES  08/23/2022   Procedure: REMOVAL OF STONES;  Surgeon: Lemar Lofty., MD;  Location: [facility];  Service:  Gastroenterology;;   Right snkle repair     SPHINCTEROTOMY  08/23/2022   Procedure: Dennison Mascot;  Surgeon: Lemar Lofty., MD;  Location: [facility];  Service: Gastroenterology;;   Patient Active Problem List   Diagnosis Date Noted   Tylenol overdose 07/05/2022   Abnormal MRI, liver 07/05/2022   Choledocholithiasis 07/05/2022   Lumbar compression fracture (HCC) 07/05/2022   Leukocytosis 07/05/2022   Stage 3a chronic kidney disease (CKD) (HCC) 07/05/2022   Lumbar nerve root impingement 07/08/2018   DDD (degenerative disc disease), lumbar 07/08/2018   Vitamin D deficiency 03/03/2018   HNP (herniated nucleus pulposus), lumbar 05/01/2017   Overweight (BMI 25.0-29.9) 12/21/2014   GERD (gastroesophageal reflux disease)    Hyperlipidemia with target LDL less than 100    Hypertension    Osteoporosis     PCP: Junie Spencer, FNP  REFERRING PROVIDER: Donalee Citrin, MD   REFERRING DIAG: Wedge compression fracture of fourth lumbar vertebra, initial encounter for closed fracture   Rationale for Evaluation and Treatment: Rehabilitation  THERAPY DIAG:  Radiculopathy, lumbar region  Muscle weakness (generalized)  ONSET DATE: "a while now"   SUBJECTIVE:  SUBJECTIVE STATEMENT: Patient reports that she has been having low back and left leg pain for a while now, but it has been getting worse. She notes that she typically does not have pain when she is sitting, but walking really hurts. She is unable to recall any cause of this pain. She feels that her back and legs are weak. She feels like her leg may be bruised on the inside. She feels that she cannot stand upright when she first gets up in the morning. She had a MRI and she is scheduled to get these results on 01/03/23.  PERTINENT HISTORY:   Hypertension, osteoporosis, arthritis, and chronic kidney disease  PAIN:  Are you having pain? Yes: NPRS scale: 7/10 Pain location: low back radiating to left ankle  Pain description: intermittent aching and throbbing Aggravating factors: walking, standing Relieving factors: sitting  PRECAUTIONS: None  RED FLAGS: None   WEIGHT BEARING RESTRICTIONS: No  FALLS:  Has patient fallen in last 6 months? No  LIVING ENVIRONMENT: Lives with: lives alone Lives in: House/apartment Stairs: Yes: Internal: "I avoid these steps" steps; on right going up Has following equipment at home: Quad cane large base  OCCUPATION: retired  PLOF: Independent with basic ADLs  PATIENT GOALS: be able to stand and walk longer (she was able to walk at least 30 minutes before this pain began, but unable to do this currently) to be able to do her housework  NEXT MD VISIT: 01/03/23  OBJECTIVE:   PATIENT SURVEYS:  FOTO 51.87  SCREENING FOR RED FLAGS: Bowel or bladder incontinence: No Spinal tumors: No Cauda equina syndrome: No Compression fracture: No Abdominal aneurysm: No  COGNITION: Overall cognitive status: Within functional limits for tasks assessed     SENSATION: Patient reports no numbness or tingling  POSTURE: rounded shoulders, forward head, and flexed trunk (stands with 6 degrees of lumbar flexion)   PALPATION: TTP: bilateral lumbar paraspinals and QL  LUMBAR JOINT MOBILITY:  Hypomobile and painful throughout   LUMBAR ROM:   AROM eval  Flexion 40; prior to knee flexion   Extension 12; low back pain   Right lateral flexion 75% limited; prior to lumbar flexion and rotation  Left lateral flexion 75% limited; prior to lumbar flexion and rotation with familiar low back and left hip pain  Right rotation 50% limited  Left rotation 50% limited; familiar low back left hip pain   (Blank rows = not tested)  LOWER EXTREMITY ROM:  WFL for activities assessed  LOWER EXTREMITY MMT:     MMT Right eval Left eval  Hip flexion 3/5 3/5  Hip extension    Hip abduction    Hip adduction    Hip internal rotation    Hip external rotation    Knee flexion 4/5 4/5  Knee extension 3+/5 4-/5  Ankle dorsiflexion 3+/5 3+/5; familiar pain below her knee  Ankle plantarflexion    Ankle inversion    Ankle eversion     (Blank rows = not tested)  GAIT: Assistive device utilized: Quad cane large base Level of assistance: Modified independence Comments: decreased gait speed and stride length  Gait speed: 0.78 m/s  TODAY'S TREATMENT:  DATE:     PATIENT EDUCATION:  Education details: POC, prognosis, healing, and goals for therapy  Person educated: Patient Education method: Explanation Education comprehension: verbalized understanding  HOME EXERCISE PROGRAM:   ASSESSMENT:  CLINICAL IMPRESSION: Patient is a 77 y.o. female who was seen today for physical therapy evaluation and treatment for chronic low back and left lower extremity pain. She presented with moderate to high pain severity and irritability with lumbar active range of motion being the most aggravating to her familiar symptoms. She also demonstrate reduced gait speed and decreased stride length bilaterally. Recommend that she continue with skilled physical therapy to address her impairments to return to her prior level of function.    OBJECTIVE IMPAIRMENTS: Abnormal gait, decreased activity tolerance, decreased mobility, difficulty walking, decreased ROM, decreased strength, hypomobility, impaired tone, postural dysfunction, and pain.   ACTIVITY LIMITATIONS: carrying, lifting, bending, standing, stairs, and locomotion level  PARTICIPATION LIMITATIONS: meal prep, cleaning, laundry, shopping, and community activity  PERSONAL FACTORS: Age, Time since onset of injury/illness/exacerbation, and 3+  comorbidities: Hypertension, osteoporosis, arthritis, and chronic kidney disease  are also affecting patient's functional outcome.   REHAB POTENTIAL: Good  CLINICAL DECISION MAKING: Evolving/moderate complexity  EVALUATION COMPLEXITY: Moderate   GOALS: Goals reviewed with patient? Yes  SHORT TERM GOALS: Target date: 01/23/23  Patient will be independent with her initial HEP.  Baseline: Goal status: INITIAL  2.  Patient will be able to complete her daily activities without her familiar pain exceeding 5/10. Baseline:  Goal status: INITIAL  3.  Patient will be able to stand and walk for at least 15 minutes without being limited by her familiar symptoms.  Baseline:  Goal status: INITIAL  LONG TERM GOALS: Target date: 02/14/23  Patient will be independent with her advanced HEP.  Baseline:  Goal status: INITIAL  2.  Patient will improve her gait speed to at least 1.0 m/s for improved community mobility.  Baseline:  Goal status: INITIAL  3.  Patient will be able to demonstrate proper lifting mechanics for improved function with household activities.  Baseline:  Goal status: INITIAL  4.  Patient will be able to walk for at least 30 minutes without being limited by her familiar symptoms.  Baseline:  Goal status: INITIAL  PLAN:  PT FREQUENCY: 2x/week  PT DURATION: 6 weeks  PLANNED INTERVENTIONS: Therapeutic exercises, Therapeutic activity, Neuromuscular re-education, Balance training, Gait training, Patient/Family education, Self Care, Joint mobilization, Stair training, Electrical stimulation, Spinal mobilization, Cryotherapy, Moist heat, Manual therapy, and Re-evaluation.  PLAN FOR NEXT SESSION: nustep, lower extremity and lumbar strengthening, bridging, lower trunk rotations, manual therapy, and modalities as needed   Granville Lewis, PT 01/02/2023, 2:26 PM

## 2023-01-03 DIAGNOSIS — Z6827 Body mass index (BMI) 27.0-27.9, adult: Secondary | ICD-10-CM | POA: Diagnosis not present

## 2023-01-03 DIAGNOSIS — M5440 Lumbago with sciatica, unspecified side: Secondary | ICD-10-CM | POA: Diagnosis not present

## 2023-01-04 ENCOUNTER — Encounter: Payer: Self-pay | Admitting: Family

## 2023-01-04 ENCOUNTER — Ambulatory Visit (INDEPENDENT_AMBULATORY_CARE_PROVIDER_SITE_OTHER): Payer: Medicare Other | Admitting: Family

## 2023-01-04 ENCOUNTER — Other Ambulatory Visit: Payer: Self-pay | Admitting: Student

## 2023-01-04 ENCOUNTER — Ambulatory Visit: Payer: Medicare Other | Admitting: *Deleted

## 2023-01-04 VITALS — BP 139/72 | HR 93 | Temp 97.8°F | Ht 60.0 in | Wt 138.0 lb

## 2023-01-04 DIAGNOSIS — M5136 Other intervertebral disc degeneration, lumbar region: Secondary | ICD-10-CM | POA: Diagnosis not present

## 2023-01-04 DIAGNOSIS — I10 Essential (primary) hypertension: Secondary | ICD-10-CM | POA: Diagnosis not present

## 2023-01-04 DIAGNOSIS — E663 Overweight: Secondary | ICD-10-CM | POA: Diagnosis not present

## 2023-01-04 DIAGNOSIS — M810 Age-related osteoporosis without current pathological fracture: Secondary | ICD-10-CM | POA: Diagnosis not present

## 2023-01-04 DIAGNOSIS — M5440 Lumbago with sciatica, unspecified side: Secondary | ICD-10-CM

## 2023-01-04 DIAGNOSIS — M5126 Other intervertebral disc displacement, lumbar region: Secondary | ICD-10-CM

## 2023-01-04 DIAGNOSIS — E785 Hyperlipidemia, unspecified: Secondary | ICD-10-CM

## 2023-01-04 DIAGNOSIS — M5416 Radiculopathy, lumbar region: Secondary | ICD-10-CM

## 2023-01-04 DIAGNOSIS — M6281 Muscle weakness (generalized): Secondary | ICD-10-CM

## 2023-01-04 DIAGNOSIS — M62830 Muscle spasm of back: Secondary | ICD-10-CM | POA: Diagnosis not present

## 2023-01-04 DIAGNOSIS — K219 Gastro-esophageal reflux disease without esophagitis: Secondary | ICD-10-CM | POA: Diagnosis not present

## 2023-01-04 DIAGNOSIS — S32020A Wedge compression fracture of second lumbar vertebra, initial encounter for closed fracture: Secondary | ICD-10-CM | POA: Diagnosis not present

## 2023-01-04 LAB — CBC WITH DIFFERENTIAL/PLATELET
Basophils Absolute: 0 10*3/uL (ref 0.0–0.2)
Basos: 1 %
EOS (ABSOLUTE): 0.2 10*3/uL (ref 0.0–0.4)
Eos: 2 %
Hematocrit: 39 % (ref 34.0–46.6)
Hemoglobin: 12.8 g/dL (ref 11.1–15.9)
Immature Grans (Abs): 0 10*3/uL (ref 0.0–0.1)
Immature Granulocytes: 0 %
Lymphocytes Absolute: 2 10*3/uL (ref 0.7–3.1)
Lymphs: 26 %
MCH: 32.2 pg (ref 26.6–33.0)
MCHC: 32.8 g/dL (ref 31.5–35.7)
MCV: 98 fL — ABNORMAL HIGH (ref 79–97)
Monocytes Absolute: 0.5 10*3/uL (ref 0.1–0.9)
Monocytes: 7 %
Neutrophils Absolute: 5.1 10*3/uL (ref 1.4–7.0)
Neutrophils: 64 %
Platelets: 321 10*3/uL (ref 150–450)
RBC: 3.97 x10E6/uL (ref 3.77–5.28)
RDW: 11.9 % (ref 11.7–15.4)
WBC: 7.9 10*3/uL (ref 3.4–10.8)

## 2023-01-04 LAB — CMP14+EGFR
ALT: 13 IU/L (ref 0–32)
AST: 16 IU/L (ref 0–40)
Albumin: 4.1 g/dL (ref 3.8–4.8)
Alkaline Phosphatase: 65 IU/L (ref 44–121)
BUN/Creatinine Ratio: 19 (ref 12–28)
BUN: 18 mg/dL (ref 8–27)
Bilirubin Total: 0.3 mg/dL (ref 0.0–1.2)
CO2: 21 mmol/L (ref 20–29)
Calcium: 9.6 mg/dL (ref 8.7–10.3)
Chloride: 105 mmol/L (ref 96–106)
Creatinine, Ser: 0.97 mg/dL (ref 0.57–1.00)
Globulin, Total: 2.2 g/dL (ref 1.5–4.5)
Glucose: 92 mg/dL (ref 70–99)
Potassium: 4.4 mmol/L (ref 3.5–5.2)
Sodium: 142 mmol/L (ref 134–144)
Total Protein: 6.3 g/dL (ref 6.0–8.5)
eGFR: 61 mL/min/{1.73_m2} (ref >59)

## 2023-01-04 MED ORDER — DENOSUMAB 60 MG/ML ~~LOC~~ SOSY
60.0000 mg | PREFILLED_SYRINGE | Freq: Once | SUBCUTANEOUS | 0 refills | Status: AC
Start: 2023-01-04 — End: 2023-01-04

## 2023-01-04 NOTE — Progress Notes (Signed)
Subjective:    Patient ID: Cheryl Lee, female    DOB: Aug 03, 1945, 77 y.o.   MRN: [id number]  Chief Complaint  Patient presents with   Medical Management of Chronic Issues   Pt presents to the office today for chronic follow up.  She has a hx of closed compression fracture and bulging lumbar disc. She continues to have intermittent aching pain of  3 out 10 when walking for a long distance, but 0 out 10 when sitting.    She had a cholecystectomy on 08/23/22. Doing well.   She has a wedge compression fracture of L4 with sciatica pain. She is doing PT and will be getting injections in the next few weeks. She reports her pain is a 5-7 when walking.    She has osteoporosis and takes fosamax weekly. Last dexa scan 01/27/20. Hypertension This is a chronic problem. The current episode started more than 1 year ago. The problem has been resolved since onset. The problem is controlled. Associated symptoms include malaise/fatigue, peripheral edema and shortness of breath ("after walking"). Risk factors for coronary artery disease include dyslipidemia and sedentary lifestyle. The current treatment provides moderate improvement.  Gastroesophageal Reflux She complains of belching and heartburn. She reports no abdominal pain. This is a chronic problem. The current episode started more than 1 year ago. The problem occurs occasionally. She has tried a PPI for the symptoms. The treatment provided moderate relief.  Hyperlipidemia This is a chronic problem. The current episode started more than 1 year ago. She has no history of obesity. Associated symptoms include shortness of breath ("after walking"). Current antihyperlipidemic treatment includes statins. The current treatment provides moderate improvement of lipids. Risk factors for coronary artery disease include dyslipidemia, hypertension, a sedentary lifestyle and post-menopausal.      Review of Systems  Constitutional:  Positive for malaise/fatigue.   Respiratory:  Positive for shortness of breath ("after walking").   Gastrointestinal:  Positive for heartburn. Negative for abdominal pain.  All other systems reviewed and are negative.      Objective:   Physical Exam Vitals reviewed.  Constitutional:      General: She is not in acute distress.    Appearance: She is well-developed. She is obese.  HENT:     Head: Normocephalic and atraumatic.     Right Ear: Tympanic membrane normal.     Left Ear: Tympanic membrane normal.  Eyes:     Pupils: Pupils are equal, round, and reactive to light.  Neck:     Thyroid: No thyromegaly.  Cardiovascular:     Rate and Rhythm: Normal rate and regular rhythm.     Heart sounds: Normal heart sounds. No murmur heard. Pulmonary:     Effort: Pulmonary effort is normal. No respiratory distress.     Breath sounds: Normal breath sounds. No wheezing.  Abdominal:     General: Bowel sounds are normal. There is no distension.     Palpations: Abdomen is soft.     Tenderness: There is no abdominal tenderness.  Musculoskeletal:        General: Tenderness present.     Cervical back: Normal range of motion and neck supple.     Comments: Pain in lumbar with standing and flexion, using cane   Skin:    General: Skin is warm and dry.  Neurological:     Mental Status: She is alert and oriented to person, place, and time.     Cranial Nerves: No cranial nerve deficit.  Deep Tendon Reflexes: Reflexes are normal and symmetric.  Psychiatric:        Behavior: Behavior normal.        Thought Content: Thought content normal.        Judgment: Judgment normal.       BP 139/72   Pulse 93   Temp 97.8 F (36.6 C) (Temporal)   Ht 5' (1.524 m)   Wt 138 lb (62.6 kg)   LMP 08/05/1991   SpO2 97%   BMI 26.95 kg/m      Assessment & Plan:   MARILUZ SHKOLNIK comes in today with chief complaint of Medical Management of Chronic Issues   Diagnosis and orders addressed:  1. DDD (degenerative disc disease),  lumbar - CBC with Differential/Platelet - CMP14+EGFR  2. HNP (herniated nucleus pulposus), lumbar - CBC with Differential/Platelet - CMP14+EGFR  3. Hyperlipidemia with target LDL less than 100 - CBC with Differential/Platelet - CMP14+EGFR  4. Primary hypertension - CBC with Differential/Platelet - CMP14+EGFR  5. Compression fracture of L2 vertebra, initial encounter (HCC) - CBC with Differential/Platelet - CMP14+EGFR - denosumab (PROLIA) 60 MG/ML SOSY injection; Inject 60 mg into the skin once for 1 dose.  Dispense: 1 mL; Refill: 0  6. Age-related osteoporosis without current pathological fracture - CBC with Differential/Platelet - CMP14+EGFR - denosumab (PROLIA) 60 MG/ML SOSY injection; Inject 60 mg into the skin once for 1 dose.  Dispense: 1 mL; Refill: 0  7. Overweight (BMI 25.0-29. - CBC with Differential/Platelet - CMP14+EGFR  8. Gastroesophageal reflux disease without esophagitis - CBC with Differential/Platelet - CMP14+EGFR   Labs pending Stop Fosamax and start Prolia  Health Maintenance reviewed Diet and exercise encouraged  Follow up plan: 3 months   Jannifer Rodney, FNP

## 2023-01-04 NOTE — Patient Instructions (Signed)

## 2023-01-04 NOTE — Therapy (Signed)
OUTPATIENT PHYSICAL THERAPY THORACOLUMBAR EVALUATION   Patient Name: Cheryl Lee MRN: [id number] DOB:17-Nov-1945, 77 y.o., female Today's Date: 01/04/2023  END OF SESSION:  PT End of Session - 01/04/23 1127     Visit Number 2    Number of Visits 12    Date for PT Re-Evaluation 03/08/23    PT Start Time 1100    PT Stop Time 1150    PT Time Calculation (min) 50 min             Past Medical History:  Diagnosis Date   Acid reflux    Arthritis    Dyspnea    HNP (herniated nucleus pulposus), lumbar    Hypercholesteremia    Hypertension    Osteoporosis    Sinus congestion    Past Surgical History:  Procedure Laterality Date   BIOPSY  08/23/2022   Procedure: BIOPSY;  Surgeon: Lemar Lofty., MD;  Location: [facility];  Service: Gastroenterology;;   CATARACT EXTRACTION     CHOLECYSTECTOMY N/A 08/23/2022   Procedure: LAPAROSCOPIC CHOLECYSTECTOMY;  Surgeon: Fritzi Mandes, MD;  Location: [facility];  Service: General;  Laterality: N/A;   COLONOSCOPY     COLONOSCOPY N/A 01/21/2020   Procedure: COLONOSCOPY;  Surgeon: Malissa Hippo, MD;  [facility];  Service: Endoscopy;  Laterality: N/A;  730   ERCP N/A 08/23/2022   Procedure: ENDOSCOPIC RETROGRADE CHOLANGIOPANCREATOGRAPHY (ERCP);  Surgeon: Lemar Lofty., MD;  Location: [facility];  Service: Gastroenterology;  Laterality: N/A;   LUMBAR LAMINECTOMY/DECOMPRESSION MICRODISCECTOMY Left 05/01/2017   Procedure: Microdiscectomy - Lumbar four-five  - left;  Surgeon: Donalee Citrin, MD;  Location: [facility];  Service: Neurosurgery;  Laterality: Left;   POLYPECTOMY  01/21/2020   Procedure: POLYPECTOMY;  Surgeon: Malissa Hippo, MD;  [facility];  Service: Endoscopy;;   REMOVAL OF STONES  08/23/2022   Procedure: REMOVAL OF STONES;  Surgeon: Lemar Lofty., MD;  [facility];  Service: Gastroenterology;;   Right snkle repair     SPHINCTEROTOMY  08/23/2022   Procedure: Dennison Mascot;  Surgeon:  Lemar Lofty., MD;  [facility]: [facility];  Service: Gastroenterology;;   Patient Active Problem List   Diagnosis Date Noted   Lumbar compression fracture (HCC) 07/05/2022   Leukocytosis 07/05/2022   Stage 3a chronic kidney disease (CKD) (HCC) 07/05/2022   Lumbar nerve root impingement 07/08/2018   DDD (degenerative disc disease), lumbar 07/08/2018   Vitamin D deficiency 03/03/2018   HNP (herniated nucleus pulposus), lumbar 05/01/2017   Overweight (BMI 25.0-29.9) 12/21/2014   GERD (gastroesophageal reflux disease)    Hyperlipidemia with target LDL less than 100    Hypertension    Osteoporosis     PCP: Junie Spencer, FNP  REFERRING PROVIDER: Donalee Citrin, MD   REFERRING DIAG: Wedge compression fracture of fourth lumbar vertebra, initial encounter for closed fracture   Rationale for Evaluation and Treatment: Rehabilitation  THERAPY DIAG:  Radiculopathy, lumbar region  Muscle weakness (generalized)  ONSET DATE: "a while now"   SUBJECTIVE:  SUBJECTIVE STATEMENT: Patient reports that she has been having low back and left leg pain for a while now, but it has been getting worse. MRI results show a herniated Disc and will have an injection soon PERTINENT HISTORY:  Hypertension, osteoporosis, arthritis, and chronic kidney disease  PAIN:  Are you having pain? Yes: NPRS scale: 7/10 Pain location: low back radiating to left ankle  Pain description: intermittent aching and throbbing Aggravating factors: walking, standing Relieving factors: sitting  PRECAUTIONS: None  RED FLAGS: None   WEIGHT BEARING RESTRICTIONS: No  FALLS:  Has patient fallen in last 6 months? No  LIVING ENVIRONMENT: Lives with: lives alone Lives in: House/apartment Stairs: Yes: Internal: "I avoid these steps"  steps; on right going up Has following equipment at home: Quad cane large base  OCCUPATION: retired  PLOF: Independent with basic ADLs  PATIENT GOALS: be able to stand and walk longer (she was able to walk at least 30 minutes before this pain began, but unable to do this currently) to be able to do her housework  NEXT MD VISIT: 01/03/23  OBJECTIVE:   PATIENT SURVEYS:  FOTO 51.87  SCREENING FOR RED FLAGS: Bowel or bladder incontinence: No Spinal tumors: No Cauda equina syndrome: No Compression fracture: No Abdominal aneurysm: No  COGNITION: Overall cognitive status: Within functional limits for tasks assessed     SENSATION: Patient reports no numbness or tingling  POSTURE: rounded shoulders, forward head, and flexed trunk (stands with 6 degrees of lumbar flexion)   PALPATION: TTP: bilateral lumbar paraspinals and QL  LUMBAR JOINT MOBILITY:  Hypomobile and painful throughout   LUMBAR ROM:   AROM eval  Flexion 40; prior to knee flexion   Extension 12; low back pain   Right lateral flexion 75% limited; prior to lumbar flexion and rotation  Left lateral flexion 75% limited; prior to lumbar flexion and rotation with familiar low back and left hip pain  Right rotation 50% limited  Left rotation 50% limited; familiar low back left hip pain   (Blank rows = not tested)  LOWER EXTREMITY ROM:  WFL for activities assessed  LOWER EXTREMITY MMT:    MMT Right eval Left eval  Hip flexion 3/5 3/5  Hip extension    Hip abduction    Hip adduction    Hip internal rotation    Hip external rotation    Knee flexion 4/5 4/5  Knee extension 3+/5 4-/5  Ankle dorsiflexion 3+/5 3+/5; familiar pain below her knee  Ankle plantarflexion    Ankle inversion    Ankle eversion     (Blank rows = not tested)  GAIT: Assistive device utilized: Quad cane large base Level of assistance: Modified independence Comments: decreased gait speed and stride length  Gait speed: 0.78  m/s  TODAY'S TREATMENT:  DATE:  01-04-23                                     EXERCISE LOG  Exercise Repetitions and Resistance Comments  Nustep L2 x 10 mins                     Blank cell = exercise not performed today  Manual STW to LT LB paras while sitting. IFC and HMP x 15 min 80-150hz  to LB paras  PATIENT EDUCATION:  Education details: POC, prognosis, healing, and goals for therapy  Person educated: Patient Education method: Explanation Education comprehension: verbalized understanding  HOME EXERCISE PROGRAM:   ASSESSMENT:  CLINICAL IMPRESSION: Pt arrived today doing fair, but reports MI results showed a herniated disc and will be set up for an injection. Rx focused on some therex, STW , as well as IFC and HMP to LB for pain reduction. Pt felt better after session.   OBJECTIVE IMPAIRMENTS: Abnormal gait, decreased activity tolerance, decreased mobility, difficulty walking, decreased ROM, decreased strength, hypomobility, impaired tone, postural dysfunction, and pain.   ACTIVITY LIMITATIONS: carrying, lifting, bending, standing, stairs, and locomotion level  PARTICIPATION LIMITATIONS: meal prep, cleaning, laundry, shopping, and community activity  PERSONAL FACTORS: Age, Time since onset of injury/illness/exacerbation, and 3+ comorbidities: Hypertension, osteoporosis, arthritis, and chronic kidney disease  are also affecting patient's functional outcome.   REHAB POTENTIAL: Good  CLINICAL DECISION MAKING: Evolving/moderate complexity  EVALUATION COMPLEXITY: Moderate   GOALS: Goals reviewed with patient? Yes  SHORT TERM GOALS: Target date: 01/23/23  Patient will be independent with her initial HEP.  Baseline: Goal status: INITIAL  2.  Patient will be able to complete her daily activities without her familiar pain exceeding 5/10. Baseline:   Goal status: INITIAL  3.  Patient will be able to stand and walk for at least 15 minutes without being limited by her familiar symptoms.  Baseline:  Goal status: INITIAL  LONG TERM GOALS: Target date: 02/14/23  Patient will be independent with her advanced HEP.  Baseline:  Goal status: INITIAL  2.  Patient will improve her gait speed to at least 1.0 m/s for improved community mobility.  Baseline:  Goal status: INITIAL  3.  Patient will be able to demonstrate proper lifting mechanics for improved function with household activities.  Baseline:  Goal status: INITIAL  4.  Patient will be able to walk for at least 30 minutes without being limited by her familiar symptoms.  Baseline:  Goal status: INITIAL  PLAN:  PT FREQUENCY: 2x/week  PT DURATION: 6 weeks  PLANNED INTERVENTIONS: Therapeutic exercises, Therapeutic activity, Neuromuscular re-education, Balance training, Gait training, Patient/Family education, Self Care, Joint mobilization, Stair training, Electrical stimulation, Spinal mobilization, Cryotherapy, Moist heat, Manual therapy, and Re-evaluation.  PLAN FOR NEXT SESSION: nustep, lower extremity and lumbar strengthening, bridging, lower trunk rotations, manual therapy, and modalities as needed   Markas Aldredge,CHRIS, PTA 01/04/2023, 12:52 PM

## 2023-01-07 ENCOUNTER — Telehealth: Payer: Self-pay | Admitting: Family

## 2023-01-07 ENCOUNTER — Telehealth: Payer: Self-pay

## 2023-01-07 NOTE — Telephone Encounter (Signed)
Prolia VOB initiated via [email or web address] 

## 2023-01-07 NOTE — Telephone Encounter (Signed)
Created new encounter for Prolia BIV. Will route encounter back once benefit verification is complete.  

## 2023-01-07 NOTE — Telephone Encounter (Signed)
Please do PA. Spoke with daughter and patient.

## 2023-01-08 ENCOUNTER — Ambulatory Visit: Payer: Medicare Other

## 2023-01-08 DIAGNOSIS — M5416 Radiculopathy, lumbar region: Secondary | ICD-10-CM | POA: Diagnosis not present

## 2023-01-08 DIAGNOSIS — M6281 Muscle weakness (generalized): Secondary | ICD-10-CM | POA: Diagnosis not present

## 2023-01-08 DIAGNOSIS — M62830 Muscle spasm of back: Secondary | ICD-10-CM | POA: Diagnosis not present

## 2023-01-08 NOTE — Therapy (Signed)
OUTPATIENT PHYSICAL THERAPY THORACOLUMBAR TREATMENT   Patient Name: Cheryl Lee MRN: [id number] DOB:[birth year], 77 y.o., female Today's Date: 01/08/2023  END OF SESSION:  PT End of Session - 01/08/23 0933     Visit Number 3    Number of Visits 12    Date for PT Re-Evaluation 03/08/23    PT Start Time 0930    PT Stop Time 1011    PT Time Calculation (min) 41 min             Past Medical History:  Diagnosis Date   Acid reflux    Arthritis    Dyspnea    HNP (herniated nucleus pulposus), lumbar    Hypercholesteremia    Hypertension    Osteoporosis    Sinus congestion    Past Surgical History:  Procedure Laterality Date   BIOPSY  08/23/2022   Procedure: BIOPSY;  Surgeon: Lemar Lofty., MD;  Location: [facility];  Service: Gastroenterology;;   CATARACT EXTRACTION     CHOLECYSTECTOMY N/A 08/23/2022   Procedure: LAPAROSCOPIC CHOLECYSTECTOMY;  Surgeon: Fritzi Mandes, MD;  Location: [facility];  Service: General;  Laterality: N/A;   COLONOSCOPY     COLONOSCOPY N/A 01/21/2020   Procedure: COLONOSCOPY;  Surgeon: Malissa Hippo, MD;  Location: [facility];  Service: Endoscopy;  Laterality: N/A;  730   ERCP N/A 08/23/2022   Procedure: ENDOSCOPIC RETROGRADE CHOLANGIOPANCREATOGRAPHY (ERCP);  Surgeon: Lemar Lofty., MD;  Location: [facility];  Service: Gastroenterology;  Laterality: N/A;   LUMBAR LAMINECTOMY/DECOMPRESSION MICRODISCECTOMY Left 05/01/2017   Procedure: Microdiscectomy - Lumbar four-five  - left;  Surgeon: Donalee Citrin, MD;  Location: [facility];  Service: Neurosurgery;  Laterality: Left;   POLYPECTOMY  01/21/2020   Procedure: POLYPECTOMY;  Surgeon: Malissa Hippo, MD;  Location: [facility];  Service: Endoscopy;;   REMOVAL OF STONES  08/23/2022   Procedure: REMOVAL OF STONES;  Surgeon: Lemar Lofty., MD;  Location: [facility];  Service: Gastroenterology;;   Right snkle repair     SPHINCTEROTOMY  08/23/2022   Procedure: Dennison Mascot;  Surgeon:  Lemar Lofty., MD;  Location: [facility];  Service: Gastroenterology;;   Patient Active Problem List   Diagnosis Date Noted   Lumbar compression fracture (HCC) 07/05/2022   Leukocytosis 07/05/2022   Stage 3a chronic kidney disease (CKD) (HCC) 07/05/2022   Lumbar nerve root impingement 07/08/2018   DDD (degenerative disc disease), lumbar 07/08/2018   Vitamin D deficiency 03/03/2018   HNP (herniated nucleus pulposus), lumbar 05/01/2017   Overweight (BMI 25.0-29.9) 12/21/2014   GERD (gastroesophageal reflux disease)    Hyperlipidemia with target LDL less than 100    Hypertension    Osteoporosis     PCP: Junie Spencer, FNP  REFERRING PROVIDER: Donalee Citrin, MD   REFERRING DIAG: Wedge compression fracture of fourth lumbar vertebra, initial encounter for closed fracture   Rationale for Evaluation and Treatment: Rehabilitation  THERAPY DIAG:  Radiculopathy, lumbar region  Muscle weakness (generalized)  ONSET DATE: "a while now"   SUBJECTIVE:  SUBJECTIVE STATEMENT: Patient reports left leg pain, 1-2/10.  Pt reports she is getting an injection on Sept 5th.   PERTINENT HISTORY:  Hypertension, osteoporosis, arthritis, and chronic kidney disease  PAIN:  Are you having pain? Yes: NPRS scale: 1-2/10 Pain location: left LE; knee to foot Pain description: intermittent aching and throbbing Aggravating factors: walking, standing Relieving factors: sitting  PRECAUTIONS: None  RED FLAGS: None   WEIGHT BEARING RESTRICTIONS: No  FALLS:  Has patient fallen in last 6 months? No  LIVING ENVIRONMENT: Lives with: lives alone Lives in: House/apartment Stairs: Yes: Internal: "I avoid these steps" steps; on right going up Has following equipment at home: Quad cane large base  OCCUPATION:  retired  PLOF: Independent with basic ADLs  PATIENT GOALS: be able to stand and walk longer (she was able to walk at least 30 minutes before this pain began, but unable to do this currently) to be able to do her housework  NEXT MD VISIT: 01/03/23  OBJECTIVE:   PATIENT SURVEYS:  FOTO 51.87  SCREENING FOR RED FLAGS: Bowel or bladder incontinence: No Spinal tumors: No Cauda equina syndrome: No Compression fracture: No Abdominal aneurysm: No  COGNITION: Overall cognitive status: Within functional limits for tasks assessed     SENSATION: Patient reports no numbness or tingling  POSTURE: rounded shoulders, forward head, and flexed trunk (stands with 6 degrees of lumbar flexion)   PALPATION: TTP: bilateral lumbar paraspinals and QL  LUMBAR JOINT MOBILITY:  Hypomobile and painful throughout   LUMBAR ROM:   AROM eval  Flexion 40; prior to knee flexion   Extension 12; low back pain   Right lateral flexion 75% limited; prior to lumbar flexion and rotation  Left lateral flexion 75% limited; prior to lumbar flexion and rotation with familiar low back and left hip pain  Right rotation 50% limited  Left rotation 50% limited; familiar low back left hip pain   (Blank rows = not tested)  LOWER EXTREMITY ROM:  WFL for activities assessed  LOWER EXTREMITY MMT:    MMT Right eval Left eval  Hip flexion 3/5 3/5  Hip extension    Hip abduction    Hip adduction    Hip internal rotation    Hip external rotation    Knee flexion 4/5 4/5  Knee extension 3+/5 4-/5  Ankle dorsiflexion 3+/5 3+/5; familiar pain below her knee  Ankle plantarflexion    Ankle inversion    Ankle eversion     (Blank rows = not tested)  GAIT: Assistive device utilized: Quad cane large base Level of assistance: Modified independence Comments: decreased gait speed and stride length  Gait speed: 0.78 m/s  TODAY'S TREATMENT:                                                                                                                               DATE:  01-08-23  EXERCISE LOG  Exercise Repetitions and Resistance Comments  Nustep L3 x 15 mins    Heel/Toe Raises 20 reps each   Rockerboard 3 mins   LAQs 2# x20 reps bil   Seated Marches 2# x20 reps bil   Clams Red x3 mins   Ball Squeeze x3 mins   Seated Ham Curls Red x20 reps bil   STS x 10 reps    Blank cell = exercise not performed today    PATIENT EDUCATION:  Education details: POC, prognosis, healing, and goals for therapy  Person educated: Patient Education method: Explanation Education comprehension: verbalized understanding  HOME EXERCISE PROGRAM:   ASSESSMENT:  CLINICAL IMPRESSION: Pt arrives for today's treatment session reporting 1-2/10 left lower extremity pain.  Pt denies any low back pain today.  Pt instructed in several standing and seated exercises to increase strength and function while decreasing pain.  Pt requiring min cues for all newly added exercises for proper technique and pacing.  Pt denied any change in pain at completion of today's treatment session.   OBJECTIVE IMPAIRMENTS: Abnormal gait, decreased activity tolerance, decreased mobility, difficulty walking, decreased ROM, decreased strength, hypomobility, impaired tone, postural dysfunction, and pain.   ACTIVITY LIMITATIONS: carrying, lifting, bending, standing, stairs, and locomotion level  PARTICIPATION LIMITATIONS: meal prep, cleaning, laundry, shopping, and community activity  PERSONAL FACTORS: Age, Time since onset of injury/illness/exacerbation, and 3+ comorbidities: Hypertension, osteoporosis, arthritis, and chronic kidney disease  are also affecting patient's functional outcome.   REHAB POTENTIAL: Good  CLINICAL DECISION MAKING: Evolving/moderate complexity  EVALUATION COMPLEXITY: Moderate   GOALS: Goals reviewed with patient? Yes  SHORT TERM GOALS: Target date: 01/23/23  Patient will be independent with  her initial HEP.  Baseline: Goal status: INITIAL  2.  Patient will be able to complete her daily activities without her familiar pain exceeding 5/10. Baseline:  Goal status: INITIAL  3.  Patient will be able to stand and walk for at least 15 minutes without being limited by her familiar symptoms.  Baseline:  Goal status: INITIAL  LONG TERM GOALS: Target date: 02/14/23  Patient will be independent with her advanced HEP.  Baseline:  Goal status: INITIAL  2.  Patient will improve her gait speed to at least 1.0 m/s for improved community mobility.  Baseline:  Goal status: INITIAL  3.  Patient will be able to demonstrate proper lifting mechanics for improved function with household activities.  Baseline:  Goal status: INITIAL  4.  Patient will be able to walk for at least 30 minutes without being limited by her familiar symptoms.  Baseline:  Goal status: INITIAL  PLAN:  PT FREQUENCY: 2x/week  PT DURATION: 6 weeks  PLANNED INTERVENTIONS: Therapeutic exercises, Therapeutic activity, Neuromuscular re-education, Balance training, Gait training, Patient/Family education, Self Care, Joint mobilization, Stair training, Electrical stimulation, Spinal mobilization, Cryotherapy, Moist heat, Manual therapy, and Re-evaluation.  PLAN FOR NEXT SESSION: nustep, lower extremity and lumbar strengthening, bridging, lower trunk rotations, manual therapy, and modalities as needed   Newman Pies, PTA 01/08/2023, 10:14 AM

## 2023-01-10 ENCOUNTER — Ambulatory Visit: Payer: Medicare Other | Admitting: Physical Therapy

## 2023-01-10 DIAGNOSIS — M62830 Muscle spasm of back: Secondary | ICD-10-CM | POA: Diagnosis not present

## 2023-01-10 DIAGNOSIS — M5416 Radiculopathy, lumbar region: Secondary | ICD-10-CM | POA: Diagnosis not present

## 2023-01-10 DIAGNOSIS — M6281 Muscle weakness (generalized): Secondary | ICD-10-CM

## 2023-01-10 NOTE — Therapy (Signed)
OUTPATIENT PHYSICAL THERAPY THORACOLUMBAR TREATMENT   Patient Name: Cheryl Lee MRN: [id number] DOB:[birth year], 77 y.o., female Today's Date: 01/10/2023  END OF SESSION:  PT End of Session - 01/10/23 1006     Visit Number 4    Number of Visits 12    Date for PT Re-Evaluation 03/08/23    PT Start Time 0930    PT Stop Time 1023    PT Time Calculation (min) 53 min    Activity Tolerance Patient tolerated treatment well    Behavior During Therapy WFL for tasks assessed/performed             Past Medical History:  Diagnosis Date   Acid reflux    Arthritis    Dyspnea    HNP (herniated nucleus pulposus), lumbar    Hypercholesteremia    Hypertension    Osteoporosis    Sinus congestion    Past Surgical History:  Procedure Laterality Date   BIOPSY  08/23/2022   Procedure: BIOPSY;  Surgeon: Lemar Lofty., MD;  Location: [facility];  Service: Gastroenterology;;   CATARACT EXTRACTION     CHOLECYSTECTOMY N/A 08/23/2022   Procedure: LAPAROSCOPIC CHOLECYSTECTOMY;  Surgeon: Fritzi Mandes, MD;  Location: [facility];  Service: General;  Laterality: N/A;   COLONOSCOPY     COLONOSCOPY N/A 01/21/2020   Procedure: COLONOSCOPY;  Surgeon: Malissa Hippo, MD;  [facility]: [facility];  Service: Endoscopy;  Laterality: N/A;  730   ERCP N/A 08/23/2022   Procedure: ENDOSCOPIC RETROGRADE CHOLANGIOPANCREATOGRAPHY (ERCP);  Surgeon: Lemar Lofty., MD;  Location: [facility];  Service: Gastroenterology;  Laterality: N/A;   LUMBAR LAMINECTOMY/DECOMPRESSION MICRODISCECTOMY Left 05/01/2017   Procedure: Microdiscectomy - Lumbar four-five  - left;  Surgeon: Donalee Citrin, MD;  Location: [facility];  Service: Neurosurgery;  Laterality: Left;   POLYPECTOMY  01/21/2020   Procedure: POLYPECTOMY;  Surgeon: Malissa Hippo, MD;  [facility]: [facility];  Service: Endoscopy;;   REMOVAL OF STONES  08/23/2022   Procedure: REMOVAL OF STONES;  Surgeon: Lemar Lofty., MD;  Location: [facility];  Service:  Gastroenterology;;   Right snkle repair     SPHINCTEROTOMY  08/23/2022   Procedure: Dennison Mascot;  Surgeon: Lemar Lofty., MD;  Location: [facility];  Service: Gastroenterology;;   Patient Active Problem List   Diagnosis Date Noted   Lumbar compression fracture (HCC) 07/05/2022   Leukocytosis 07/05/2022   Stage 3a chronic kidney disease (CKD) (HCC) 07/05/2022   Lumbar nerve root impingement 07/08/2018   DDD (degenerative disc disease), lumbar 07/08/2018   Vitamin D deficiency 03/03/2018   HNP (herniated nucleus pulposus), lumbar 05/01/2017   Overweight (BMI 25.0-29.9) 12/21/2014   GERD (gastroesophageal reflux disease)    Hyperlipidemia with target LDL less than 100    Hypertension    Osteoporosis     PCP: Junie Spencer, FNP  REFERRING PROVIDER: Donalee Citrin, MD   REFERRING DIAG: Wedge compression fracture of fourth lumbar vertebra, initial encounter for closed fracture   Rationale for Evaluation and Treatment: Rehabilitation  THERAPY DIAG:  Radiculopathy, lumbar region  Muscle weakness (generalized)  Muscle spasm of back  ONSET DATE: "a while now"   SUBJECTIVE:  SUBJECTIVE STATEMENT: Patient reports left leg pain, 1-2/10.  Pt reports she is getting an injection on Sept 5th.   PERTINENT HISTORY:  Hypertension, osteoporosis, arthritis, and chronic kidney disease  PAIN:  Are you having pain? Yes: NPRS scale: 1-2/10 Pain location: left LE; knee to foot Pain description: intermittent aching and throbbing Aggravating factors: walking, standing Relieving factors: sitting  PRECAUTIONS: None  RED FLAGS: None   WEIGHT BEARING RESTRICTIONS: No  FALLS:  Has patient fallen in last 6 months? No  LIVING ENVIRONMENT: Lives with: lives alone Lives in: House/apartment Stairs:  Yes: Internal: "I avoid these steps" steps; on right going up Has following equipment at home: Quad cane large base  OCCUPATION: retired  PLOF: Independent with basic ADLs  PATIENT GOALS: be able to stand and walk longer (she was able to walk at least 30 minutes before this pain began, but unable to do this currently) to be able to do her housework  NEXT MD VISIT: 01/03/23  OBJECTIVE:   PATIENT SURVEYS:  FOTO 51.87  SCREENING FOR RED FLAGS: Bowel or bladder incontinence: No Spinal tumors: No Cauda equina syndrome: No Compression fracture: No Abdominal aneurysm: No  COGNITION: Overall cognitive status: Within functional limits for tasks assessed     SENSATION: Patient reports no numbness or tingling  POSTURE: rounded shoulders, forward head, and flexed trunk (stands with 6 degrees of lumbar flexion)   PALPATION: TTP: bilateral lumbar paraspinals and QL  LUMBAR JOINT MOBILITY:  Hypomobile and painful throughout   LUMBAR ROM:   AROM eval  Flexion 40; prior to knee flexion   Extension 12; low back pain   Right lateral flexion 75% limited; prior to lumbar flexion and rotation  Left lateral flexion 75% limited; prior to lumbar flexion and rotation with familiar low back and left hip pain  Right rotation 50% limited  Left rotation 50% limited; familiar low back left hip pain   (Blank rows = not tested)  LOWER EXTREMITY ROM:  WFL for activities assessed  LOWER EXTREMITY MMT:    MMT Right eval Left eval  Hip flexion 3/5 3/5  Hip extension    Hip abduction    Hip adduction    Hip internal rotation    Hip external rotation    Knee flexion 4/5 4/5  Knee extension 3+/5 4-/5  Ankle dorsiflexion 3+/5 3+/5; familiar pain below her knee  Ankle plantarflexion    Ankle inversion    Ankle eversion     (Blank rows = not tested)  GAIT: Assistive device utilized: Quad cane large base Level of assistance: Modified independence Comments: decreased gait speed and stride  length  Gait speed: 0.78 m/s  TODAY'S TREATMENT:                                                                                                                              DATE:  01-10-23  EXERCISE LOG  Exercise Repetitions and Resistance Comments  Nustep L3 x 16 mins        Patient in right SDLY position with folded pillow between knees for comfort:  STW/M x 7 minutes with focus on left QL with ischemic release technique utilized f/b HMP and IFC at 80-150 Hz on 40% scan x 20 minutes.      PATIENT EDUCATION:  Education details: POC, prognosis, healing, and goals for therapy  Person educated: Patient Education method: Explanation Education comprehension: verbalized understanding  HOME EXERCISE PROGRAM:   ASSESSMENT:  CLINICAL IMPRESSION: Left with increased tone and tautness.  Good response to STW/M.  She felt good after treatment.  OBJECTIVE IMPAIRMENTS: Abnormal gait, decreased activity tolerance, decreased mobility, difficulty walking, decreased ROM, decreased strength, hypomobility, impaired tone, postural dysfunction, and pain.   ACTIVITY LIMITATIONS: carrying, lifting, bending, standing, stairs, and locomotion level  PARTICIPATION LIMITATIONS: meal prep, cleaning, laundry, shopping, and community activity  PERSONAL FACTORS: Age, Time since onset of injury/illness/exacerbation, and 3+ comorbidities: Hypertension, osteoporosis, arthritis, and chronic kidney disease  are also affecting patient's functional outcome.   REHAB POTENTIAL: Good  CLINICAL DECISION MAKING: Evolving/moderate complexity  EVALUATION COMPLEXITY: Moderate   GOALS: Goals reviewed with patient? Yes  SHORT TERM GOALS: Target date: 01/23/23  Patient will be independent with her initial HEP.  Baseline: Goal status: INITIAL  2.  Patient will be able to complete her daily activities without her familiar pain exceeding 5/10. Baseline:  Goal status: INITIAL  3.   Patient will be able to stand and walk for at least 15 minutes without being limited by her familiar symptoms.  Baseline:  Goal status: INITIAL  LONG TERM GOALS: Target date: 02/14/23  Patient will be independent with her advanced HEP.  Baseline:  Goal status: INITIAL  2.  Patient will improve her gait speed to at least 1.0 m/s for improved community mobility.  Baseline:  Goal status: INITIAL  3.  Patient will be able to demonstrate proper lifting mechanics for improved function with household activities.  Baseline:  Goal status: INITIAL  4.  Patient will be able to walk for at least 30 minutes without being limited by her familiar symptoms.  Baseline:  Goal status: INITIAL  PLAN:  PT FREQUENCY: 2x/week  PT DURATION: 6 weeks  PLANNED INTERVENTIONS: Therapeutic exercises, Therapeutic activity, Neuromuscular re-education, Balance training, Gait training, Patient/Family education, Self Care, Joint mobilization, Stair training, Electrical stimulation, Spinal mobilization, Cryotherapy, Moist heat, Manual therapy, and Re-evaluation.  PLAN FOR NEXT SESSION: nustep, lower extremity and lumbar strengthening, bridging, lower trunk rotations, manual therapy, and modalities as needed   Hanford Lust, [place], PT 01/10/2023, 10:28 AM

## 2023-01-11 NOTE — Telephone Encounter (Signed)
Pt ready for scheduling for [facility] on or after : 01/11/23  Out-of-pocket cost due at time of visit: $0  Primary: MEDICARE [facility] co-insurance: 0% Admin fee co-insurance: 0%  Secondary: [employer] MED[employer] [facility] co-insurance:  Admin fee co-insurance:   Medical Benefit Details: Date Benefits were checked: 01/07/23 Deductible: $240 MET OF $240 REQUIRED/ Coinsurance: 0%/ Admin Fee: 0%  Prior Auth: N/A PA# Expiration Date:   # of doses approved:  Pharmacy benefit: Copay $--- If patient wants fill through the pharmacy benefit please send prescription to:  --- , and include estimated need by date in rx notes. Pharmacy will ship medication directly to the office.  Patient NOT eligible for [facility] Copay Card. Copay Card can make patient's cost as little as $25. Link to apply: [email or web address]://[email or web address]  ** This summary of benefits is an estimation of the patient's out-of-pocket cost. Exact cost may very based on individual plan coverage.

## 2023-01-15 ENCOUNTER — Ambulatory Visit: Payer: Medicare Other | Attending: Neurosurgery

## 2023-01-15 DIAGNOSIS — M5416 Radiculopathy, lumbar region: Secondary | ICD-10-CM | POA: Insufficient documentation

## 2023-01-15 DIAGNOSIS — M6281 Muscle weakness (generalized): Secondary | ICD-10-CM | POA: Insufficient documentation

## 2023-01-15 DIAGNOSIS — M62830 Muscle spasm of back: Secondary | ICD-10-CM | POA: Diagnosis not present

## 2023-01-15 NOTE — Telephone Encounter (Signed)
Daughter called and appt scheduled for 01/28/23.

## 2023-01-15 NOTE — Therapy (Signed)
OUTPATIENT PHYSICAL THERAPY THORACOLUMBAR TREATMENT   Patient Name: Cheryl Lee MRN: [id number] DOB:February 02, 1946, 77 y.o., female Today's Date: 01/15/2023  END OF SESSION:  PT End of Session - 01/15/23 0936     Visit Number 5    Number of Visits 12    Date for PT Re-Evaluation 03/08/23    PT Start Time 0930    PT Stop Time 1045    PT Time Calculation (min) 75 min    Activity Tolerance Patient tolerated treatment well    Behavior During Therapy [facility] for tasks assessed/performed             Past Medical History:  Diagnosis Date   Acid reflux    Arthritis    Dyspnea    HNP (herniated nucleus pulposus), lumbar    Hypercholesteremia    Hypertension    Osteoporosis    Sinus congestion    Past Surgical History:  Procedure Laterality Date   BIOPSY  08/23/2022   Procedure: BIOPSY;  Surgeon: Lemar Lofty., MD;  Location: [facility];  Service: Gastroenterology;;   CATARACT EXTRACTION     CHOLECYSTECTOMY N/A 08/23/2022   Procedure: LAPAROSCOPIC CHOLECYSTECTOMY;  Surgeon: Fritzi Mandes, MD;  Location: [facility];  Service: General;  Laterality: N/A;   COLONOSCOPY     COLONOSCOPY N/A 01/21/2020   Procedure: COLONOSCOPY;  Surgeon: Malissa Hippo, MD;  Location: [facility];  Service: Endoscopy;  Laterality: N/A;  730   ERCP N/A 08/23/2022   Procedure: ENDOSCOPIC RETROGRADE CHOLANGIOPANCREATOGRAPHY (ERCP);  Surgeon: Lemar Lofty., MD;  Location: [facility];  Service: Gastroenterology;  Laterality: N/A;   LUMBAR LAMINECTOMY/DECOMPRESSION MICRODISCECTOMY Left 05/01/2017   Procedure: Microdiscectomy - Lumbar four-five  - left;  Surgeon: Donalee Citrin, MD;  Location: [facility];  Service: Neurosurgery;  Laterality: Left;   POLYPECTOMY  01/21/2020   Procedure: POLYPECTOMY;  Surgeon: Malissa Hippo, MD;  Location: [facility];  Service: Endoscopy;;   REMOVAL OF STONES  08/23/2022   Procedure: REMOVAL OF STONES;  Surgeon: Lemar Lofty., MD;  Location: [facility];  Service:  Gastroenterology;;   Right snkle repair     SPHINCTEROTOMY  08/23/2022   Procedure: Dennison Mascot;  Surgeon: Lemar Lofty., MD;  Location: [facility];  Service: Gastroenterology;;   Patient Active Problem List   Diagnosis Date Noted   Lumbar compression fracture (HCC) 07/05/2022   Leukocytosis 07/05/2022   Stage 3a chronic kidney disease (CKD) (HCC) 07/05/2022   Lumbar nerve root impingement 07/08/2018   DDD (degenerative disc disease), lumbar 07/08/2018   Vitamin D deficiency 03/03/2018   HNP (herniated nucleus pulposus), lumbar 05/01/2017   Overweight (BMI 25.0-29.9) 12/21/2014   GERD (gastroesophageal reflux disease)    Hyperlipidemia with target LDL less than 100    Hypertension    Osteoporosis     PCP: Junie Spencer, FNP  REFERRING PROVIDER: Donalee Citrin, MD   REFERRING DIAG: Wedge compression fracture of fourth lumbar vertebra, initial encounter for closed fracture   Rationale for Evaluation and Treatment: Rehabilitation  THERAPY DIAG:  Radiculopathy, lumbar region  Muscle weakness (generalized)  Muscle spasm of back  ONSET DATE: "a while now"   SUBJECTIVE:  SUBJECTIVE STATEMENT: Patient reports left leg pain, 1-2/10.   PERTINENT HISTORY:  Hypertension, osteoporosis, arthritis, and chronic kidney disease  PAIN:  Are you having pain? Yes: NPRS scale: 1-2/10 Pain location: left LE; knee to foot Pain description: intermittent aching and throbbing Aggravating factors: walking, standing Relieving factors: sitting  PRECAUTIONS: None  RED FLAGS: None   WEIGHT BEARING RESTRICTIONS: No  FALLS:  Has patient fallen in last 6 months? No  LIVING ENVIRONMENT: Lives with: lives alone Lives in: House/apartment Stairs: Yes: Internal: "I avoid these steps" steps; on right  going up Has following equipment at home: Quad cane large base  OCCUPATION: retired  PLOF: Independent with basic ADLs  PATIENT GOALS: be able to stand and walk longer (she was able to walk at least 30 minutes before this pain began, but unable to do this currently) to be able to do her housework  NEXT MD VISIT: 01/03/23  OBJECTIVE:   PATIENT SURVEYS:  FOTO 51.87  SCREENING FOR RED FLAGS: Bowel or bladder incontinence: No Spinal tumors: No Cauda equina syndrome: No Compression fracture: No Abdominal aneurysm: No  COGNITION: Overall cognitive status: Within functional limits for tasks assessed     SENSATION: Patient reports no numbness or tingling  POSTURE: rounded shoulders, forward head, and flexed trunk (stands with 6 degrees of lumbar flexion)   PALPATION: TTP: bilateral lumbar paraspinals and QL  LUMBAR JOINT MOBILITY:  Hypomobile and painful throughout   LUMBAR ROM:   AROM eval  Flexion 40; prior to knee flexion   Extension 12; low back pain   Right lateral flexion 75% limited; prior to lumbar flexion and rotation  Left lateral flexion 75% limited; prior to lumbar flexion and rotation with familiar low back and left hip pain  Right rotation 50% limited  Left rotation 50% limited; familiar low back left hip pain   (Blank rows = not tested)  LOWER EXTREMITY ROM:  WFL for activities assessed  LOWER EXTREMITY MMT:    MMT Right eval Left eval  Hip flexion 3/5 3/5  Hip extension    Hip abduction    Hip adduction    Hip internal rotation    Hip external rotation    Knee flexion 4/5 4/5  Knee extension 3+/5 4-/5  Ankle dorsiflexion 3+/5 3+/5; familiar pain below her knee  Ankle plantarflexion    Ankle inversion    Ankle eversion     (Blank rows = not tested)  GAIT: Assistive device utilized: Quad cane large base Level of assistance: Modified independence Comments: decreased gait speed and stride length  Gait speed: 0.78 m/s  TODAY'S TREATMENT:                                                                                                                               DATE:  01-15-23  EXERCISE LOG  Exercise Repetitions and Resistance Comments  Nustep L3 x 16 mins    Rockerboard 3 mins   Standing Marches Airex 3 mins   LAQs 2# x 25 reps bil   Seated Marches 2# x 25 reps bil   Seated Hip Abduction Red 3 mins   Seated Hip Adduction 3 mins   Seated Ham Curls Red x 25 reps bil   STS     Modalities  Date:  Unattended Estim: Lumbar, IFC 80-150 Hz, 15 mins, Pain Hot Pack: Lumbar, 15 mins, Pain and Tone  PATIENT EDUCATION:  Education details: POC, prognosis, healing, and goals for therapy  Person educated: Patient Education method: Explanation Education comprehension: verbalized understanding  HOME EXERCISE PROGRAM:   ASSESSMENT:  CLINICAL IMPRESSION: Pt arrives for today's treatment session reporting minimal low back pain during ambulation.  Pt eager to get injection on Thursday.  Pt able to tolerate introduction to Airex balance activities today.  Pt requiring BUE support with all balance exercises today.  Pt able to tolerate increased time or reps with all seated exercises today with mild fatigue noted.  Normal responses to estim and MH noted upon removal.  Pt denied any pain at completion of today's treatment session.  OBJECTIVE IMPAIRMENTS: Abnormal gait, decreased activity tolerance, decreased mobility, difficulty walking, decreased ROM, decreased strength, hypomobility, impaired tone, postural dysfunction, and pain.   ACTIVITY LIMITATIONS: carrying, lifting, bending, standing, stairs, and locomotion level  PARTICIPATION LIMITATIONS: meal prep, cleaning, laundry, shopping, and community activity  PERSONAL FACTORS: Age, Time since onset of injury/illness/exacerbation, and 3+ comorbidities: Hypertension, osteoporosis, arthritis, and chronic kidney disease  are also affecting patient's  functional outcome.   REHAB POTENTIAL: Good  CLINICAL DECISION MAKING: Evolving/moderate complexity  EVALUATION COMPLEXITY: Moderate   GOALS: Goals reviewed with patient? Yes  SHORT TERM GOALS: Target date: 01/23/23  Patient will be independent with her initial HEP.  Baseline: Goal status: INITIAL  2.  Patient will be able to complete her daily activities without her familiar pain exceeding 5/10. Baseline:  Goal status: INITIAL  3.  Patient will be able to stand and walk for at least 15 minutes without being limited by her familiar symptoms.  Baseline:  Goal status: INITIAL  LONG TERM GOALS: Target date: 02/14/23  Patient will be independent with her advanced HEP.  Baseline:  Goal status: INITIAL  2.  Patient will improve her gait speed to at least 1.0 m/s for improved community mobility.  Baseline:  Goal status: INITIAL  3.  Patient will be able to demonstrate proper lifting mechanics for improved function with household activities.  Baseline:  Goal status: INITIAL  4.  Patient will be able to walk for at least 30 minutes without being limited by her familiar symptoms.  Baseline:  Goal status: INITIAL  PLAN:  PT FREQUENCY: 2x/week  PT DURATION: 6 weeks  PLANNED INTERVENTIONS: Therapeutic exercises, Therapeutic activity, Neuromuscular re-education, Balance training, Gait training, Patient/Family education, Self Care, Joint mobilization, Stair training, Electrical stimulation, Spinal mobilization, Cryotherapy, Moist heat, Manual therapy, and Re-evaluation.  PLAN FOR NEXT SESSION: nustep, lower extremity and lumbar strengthening, bridging, lower trunk rotations, manual therapy, and modalities as needed   Newman Pies, PTA 01/15/2023, 12:05 PM

## 2023-01-16 NOTE — Discharge Instructions (Signed)

## 2023-01-17 ENCOUNTER — Ambulatory Visit
Admission: RE | Admit: 2023-01-17 | Discharge: 2023-01-17 | Disposition: A | Discharge status: Home / Self Care | Payer: Medicare Other | Source: Ambulatory Visit | Attending: Student | Admitting: Student

## 2023-01-17 DIAGNOSIS — M5416 Radiculopathy, lumbar region: Secondary | ICD-10-CM | POA: Diagnosis not present

## 2023-01-17 DIAGNOSIS — M5440 Lumbago with sciatica, unspecified side: Secondary | ICD-10-CM

## 2023-01-17 MED ORDER — IOPAMIDOL (ISOVUE-M 200) INJECTION 41%
1.0000 mL | Freq: Once | INTRAMUSCULAR | Status: AC
  Administered 2023-01-17: 1 mL via EPIDURAL

## 2023-01-17 MED ORDER — METHYLPREDNISOLONE ACETATE 40 MG/ML INJ SUSP (RADIOLOG
80.0000 mg | Freq: Once | INTRAMUSCULAR | Status: AC
  Administered 2023-01-17: 80 mg via EPIDURAL

## 2023-01-22 ENCOUNTER — Encounter: Payer: Self-pay | Admitting: *Deleted

## 2023-01-22 ENCOUNTER — Ambulatory Visit: Payer: Medicare Other | Admitting: *Deleted

## 2023-01-22 DIAGNOSIS — M62830 Muscle spasm of back: Secondary | ICD-10-CM | POA: Diagnosis not present

## 2023-01-22 DIAGNOSIS — M6281 Muscle weakness (generalized): Secondary | ICD-10-CM | POA: Diagnosis not present

## 2023-01-22 DIAGNOSIS — M5416 Radiculopathy, lumbar region: Secondary | ICD-10-CM

## 2023-01-22 NOTE — Therapy (Signed)
OUTPATIENT PHYSICAL THERAPY THORACOLUMBAR TREATMENT   Patient Name: Cheryl Lee MRN: [id number] DOB:10-22-1945, 77 y.o., female Today's Date: 01/22/2023  END OF SESSION:  PT End of Session - 01/22/23 0934     Visit Number 6    Number of Visits 12    Date for PT Re-Evaluation 03/08/23    PT Start Time 0930    PT Stop Time 1020    PT Time Calculation (min) 50 min             Past Medical History:  Diagnosis Date   Acid reflux    Arthritis    Dyspnea    HNP (herniated nucleus pulposus), lumbar    Hypercholesteremia    Hypertension    Osteoporosis    Sinus congestion    Past Surgical History:  Procedure Laterality Date   BIOPSY  08/23/2022   Procedure: BIOPSY;  Surgeon: Lemar Lofty., MD;  Location: [facility];  Service: Gastroenterology;;   CATARACT EXTRACTION     CHOLECYSTECTOMY N/A 08/23/2022   Procedure: LAPAROSCOPIC CHOLECYSTECTOMY;  Surgeon: Fritzi Mandes, MD;  Location: [facility];  Service: General;  Laterality: N/A;   COLONOSCOPY     COLONOSCOPY N/A 01/21/2020   Procedure: COLONOSCOPY;  Surgeon: Malissa Hippo, MD;  [facility]: [facility];  Service: Endoscopy;  Laterality: N/A;  730   ERCP N/A 08/23/2022   Procedure: ENDOSCOPIC RETROGRADE CHOLANGIOPANCREATOGRAPHY (ERCP);  Surgeon: Lemar Lofty., MD;  Location: [facility];  Service: Gastroenterology;  Laterality: N/A;   LUMBAR LAMINECTOMY/DECOMPRESSION MICRODISCECTOMY Left 05/01/2017   Procedure: Microdiscectomy - Lumbar four-five  - left;  Surgeon: Donalee Citrin, MD;  Location: [facility];  Service: Neurosurgery;  Laterality: Left;   POLYPECTOMY  01/21/2020   Procedure: POLYPECTOMY;  Surgeon: Malissa Hippo, MD;  [facility];  Service: Endoscopy;;   REMOVAL OF STONES  08/23/2022   Procedure: REMOVAL OF STONES;  Surgeon: Lemar Lofty., MD;  Location: [facility];  Service: Gastroenterology;;   Right snkle repair     SPHINCTEROTOMY  08/23/2022   Procedure: Dennison Mascot;  Surgeon:  Lemar Lofty., MD;  Location: [facility];  Service: Gastroenterology;;   Patient Active Problem List   Diagnosis Date Noted   Lumbar compression fracture (HCC) 07/05/2022   Leukocytosis 07/05/2022   Stage 3a chronic kidney disease (CKD) (HCC) 07/05/2022   Lumbar nerve root impingement 07/08/2018   DDD (degenerative disc disease), lumbar 07/08/2018   Vitamin D deficiency 03/03/2018   HNP (herniated nucleus pulposus), lumbar 05/01/2017   Overweight (BMI 25.0-29.9) 12/21/2014   GERD (gastroesophageal reflux disease)    Hyperlipidemia with target LDL less than 100    Hypertension    Osteoporosis     PCP: Junie Spencer, FNP  REFERRING PROVIDER: Donalee Citrin, MD   REFERRING DIAG: Wedge compression fracture of fourth lumbar vertebra, initial encounter for closed fracture   Rationale for Evaluation and Treatment: Rehabilitation  THERAPY DIAG:  Radiculopathy, lumbar region  Muscle weakness (generalized)  Muscle spasm of back  ONSET DATE: "a while now"   SUBJECTIVE:  SUBJECTIVE STATEMENT: Patient reports having LB injection and doing good.    PERTINENT HISTORY:  Hypertension, osteoporosis, arthritis, and chronic kidney disease  PAIN:  Are you having pain? Yes: NPRS scale: 1-2/10 Pain location: left LE; knee to foot Pain description: intermittent aching and throbbing Aggravating factors: walking, standing Relieving factors: sitting  PRECAUTIONS: None  RED FLAGS: None   WEIGHT BEARING RESTRICTIONS: No  FALLS:  Has patient fallen in last 6 months? No  LIVING ENVIRONMENT: Lives with: lives alone Lives in: House/apartment Stairs: Yes: Internal: "I avoid these steps" steps; on right going up Has following equipment at home: Quad cane large base  OCCUPATION: retired  PLOF:  Independent with basic ADLs  PATIENT GOALS: be able to stand and walk longer (she was able to walk at least 30 minutes before this pain began, but unable to do this currently) to be able to do her housework  NEXT MD VISIT: 01/03/23  OBJECTIVE:   PATIENT SURVEYS:  FOTO 51.87  SCREENING FOR RED FLAGS: Bowel or bladder incontinence: No Spinal tumors: No Cauda equina syndrome: No Compression fracture: No Abdominal aneurysm: No  COGNITION: Overall cognitive status: Within functional limits for tasks assessed     SENSATION: Patient reports no numbness or tingling  POSTURE: rounded shoulders, forward head, and flexed trunk (stands with 6 degrees of lumbar flexion)   PALPATION: TTP: bilateral lumbar paraspinals and QL  LUMBAR JOINT MOBILITY:  Hypomobile and painful throughout   LUMBAR ROM:   AROM eval  Flexion 40; prior to knee flexion   Extension 12; low back pain   Right lateral flexion 75% limited; prior to lumbar flexion and rotation  Left lateral flexion 75% limited; prior to lumbar flexion and rotation with familiar low back and left hip pain  Right rotation 50% limited  Left rotation 50% limited; familiar low back left hip pain   (Blank rows = not tested)  LOWER EXTREMITY ROM:  WFL for activities assessed  LOWER EXTREMITY MMT:    MMT Right eval Left eval  Hip flexion 3/5 3/5  Hip extension    Hip abduction    Hip adduction    Hip internal rotation    Hip external rotation    Knee flexion 4/5 4/5  Knee extension 3+/5 4-/5  Ankle dorsiflexion 3+/5 3+/5; familiar pain below her knee  Ankle plantarflexion    Ankle inversion    Ankle eversion     (Blank rows = not tested)  GAIT: Assistive device utilized: Quad cane large base Level of assistance: Modified independence Comments: decreased gait speed and stride length  Gait speed: 0.78 m/s  TODAY'S TREATMENT:                                                                                                                               DATE:  01-22-23                                   EXERCISE LOG  Exercise Repetitions and Resistance Comments  Nustep L3 x 16 mins      Rockerboard    Standing Marches    LAQs 2# 3x10 reps bil   Seated Marches 2# 3 x 10 reps bil   Seated Hip Abduction Red 3 mins   Seated Hip Adduction Ball squeeze 3 mins   Seated Ham Curls    STS     Modalities  Date:  Unattended Estim: Lumbar, IFC 80-150 Hz, 15 mins, Pain Hot Pack: Lumbar, 15 mins, Pain and Tone  PATIENT EDUCATION:  Education details: POC, prognosis, healing, and goals for therapy  Person educated: Patient Education method: Explanation Education comprehension: verbalized understanding  HOME EXERCISE PROGRAM:   ASSESSMENT:  CLINICAL IMPRESSION: Pt arrives for today's treatment session reporting  having her injection with minimal low back  and leg pain. She was able to continue with seated and standing exs and did well.  STG #1 and 2 Met today. #3 NM due to pain. Standing up straight in the morning is getting easier.  Normal responses to estim and MH noted upon removal.  Pt denied any pain at completion of today's treatment session.  OBJECTIVE IMPAIRMENTS: Abnormal gait, decreased activity tolerance, decreased mobility, difficulty walking, decreased ROM, decreased strength, hypomobility, impaired tone, postural dysfunction, and pain.   ACTIVITY LIMITATIONS: carrying, lifting, bending, standing, stairs, and locomotion level  PARTICIPATION LIMITATIONS: meal prep, cleaning, laundry, shopping, and community activity  PERSONAL FACTORS: Age, Time since onset of injury/illness/exacerbation, and 3+ comorbidities: Hypertension, osteoporosis, arthritis, and chronic kidney disease  are also affecting patient's functional outcome.   REHAB POTENTIAL: Good  CLINICAL DECISION MAKING: Evolving/moderate complexity  EVALUATION COMPLEXITY:  Moderate   GOALS: Goals reviewed with patient? Yes  SHORT TERM GOALS: Target date: 01/23/23  Patient will be independent with her initial HEP.  Baseline: Goal status: MET  2.  Patient will be able to complete her daily activities without her familiar pain exceeding 5/10. Baseline:  Goal status: MET  3.  Patient will be able to stand and walk for at least 15 minutes without being limited by her familiar symptoms.  Baseline:  Goal status: On going  LONG TERM GOALS: Target date: 02/14/23  Patient will be independent with her advanced HEP.  Baseline:  Goal status: INITIAL  2.  Patient will improve her gait speed to at least 1.0 m/s for improved community mobility.  Baseline:  Goal status: INITIAL  3.  Patient will be able to demonstrate proper lifting mechanics for improved function with household activities.  Baseline:  Goal status: INITIAL  4.  Patient will be able to walk for at least 30 minutes without being limited by her familiar symptoms.  Baseline:  Goal status: INITIAL  PLAN:  PT FREQUENCY: 2x/week  PT DURATION: 6 weeks  PLANNED INTERVENTIONS: Therapeutic exercises, Therapeutic activity, Neuromuscular re-education, Balance training, Gait training, Patient/Family education, Self Care, Joint mobilization, Stair training, Electrical stimulation, Spinal mobilization, Cryotherapy, Moist heat, Manual therapy, and Re-evaluation.  PLAN FOR NEXT SESSION: nustep, lower extremity and lumbar strengthening, bridging, , manual therapy, and modalities as needed   Savir Blanke,CHRIS, PTA 01/22/2023, 1:04 PM

## 2023-01-24 ENCOUNTER — Ambulatory Visit: Payer: Medicare Other

## 2023-01-24 DIAGNOSIS — M62830 Muscle spasm of back: Secondary | ICD-10-CM

## 2023-01-24 DIAGNOSIS — M6281 Muscle weakness (generalized): Secondary | ICD-10-CM | POA: Diagnosis not present

## 2023-01-24 DIAGNOSIS — M5416 Radiculopathy, lumbar region: Secondary | ICD-10-CM

## 2023-01-24 NOTE — Therapy (Signed)
OUTPATIENT PHYSICAL THERAPY THORACOLUMBAR TREATMENT   Patient Name: Cheryl Lee MRN: [id number] DOB:03/26/1946, 77 y.o., female Today's Date: 01/24/2023  END OF SESSION:  PT End of Session - 01/24/23 0943     Visit Number 7    Number of Visits 12    Date for PT Re-Evaluation 03/08/23    PT Start Time 0930    PT Stop Time 1024    PT Time Calculation (min) 54 min             Past Medical History:  Diagnosis Date   Acid reflux    Arthritis    Dyspnea    HNP (herniated nucleus pulposus), lumbar    Hypercholesteremia    Hypertension    Osteoporosis    Sinus congestion    Past Surgical History:  Procedure Laterality Date   BIOPSY  08/23/2022   Procedure: BIOPSY;  Surgeon: Lemar Lofty., MD;  Location: [facility];  Service: Gastroenterology;;   CATARACT EXTRACTION     CHOLECYSTECTOMY N/A 08/23/2022   Procedure: LAPAROSCOPIC CHOLECYSTECTOMY;  Surgeon: Fritzi Mandes, MD;  Location: [facility];  Service: General;  Laterality: N/A;   COLONOSCOPY     COLONOSCOPY N/A 01/21/2020   Procedure: COLONOSCOPY;  Surgeon: Malissa Hippo, MD;  Location: [facility];  Service: Endoscopy;  Laterality: N/A;  730   ERCP N/A 08/23/2022   Procedure: ENDOSCOPIC RETROGRADE CHOLANGIOPANCREATOGRAPHY (ERCP);  Surgeon: Lemar Lofty., MD;  Location: [facility];  Service: Gastroenterology;  Laterality: N/A;   LUMBAR LAMINECTOMY/DECOMPRESSION MICRODISCECTOMY Left 05/01/2017   Procedure: Microdiscectomy - Lumbar four-five  - left;  Surgeon: Donalee Citrin, MD;  Location: [facility];  Service: Neurosurgery;  Laterality: Left;   POLYPECTOMY  01/21/2020   Procedure: POLYPECTOMY;  Surgeon: Malissa Hippo, MD;  Location: [facility];  Service: Endoscopy;;   REMOVAL OF STONES  08/23/2022   Procedure: REMOVAL OF STONES;  Surgeon: Lemar Lofty., MD;  Location: [facility];  Service: Gastroenterology;;   Right snkle repair     SPHINCTEROTOMY  08/23/2022   Procedure: Dennison Mascot;  Surgeon:  Lemar Lofty., MD;  Location: [facility];  Service: Gastroenterology;;   Patient Active Problem List   Diagnosis Date Noted   Lumbar compression fracture (HCC) 07/05/2022   Leukocytosis 07/05/2022   Stage 3a chronic kidney disease (CKD) (HCC) 07/05/2022   Lumbar nerve root impingement 07/08/2018   DDD (degenerative disc disease), lumbar 07/08/2018   Vitamin D deficiency 03/03/2018   HNP (herniated nucleus pulposus), lumbar 05/01/2017   Overweight (BMI 25.0-29.9) 12/21/2014   GERD (gastroesophageal reflux disease)    Hyperlipidemia with target LDL less than 100    Hypertension    Osteoporosis     PCP: Junie Spencer, FNP  REFERRING PROVIDER: Donalee Citrin, MD   REFERRING DIAG: Wedge compression fracture of fourth lumbar vertebra, initial encounter for closed fracture   Rationale for Evaluation and Treatment: Rehabilitation  THERAPY DIAG:  Radiculopathy, lumbar region  Muscle weakness (generalized)  Muscle spasm of back  ONSET DATE: "a while now"   SUBJECTIVE:  SUBJECTIVE STATEMENT: Pt reports that left LE feels "a little achy" today.   PERTINENT HISTORY:  Hypertension, osteoporosis, arthritis, and chronic kidney disease  PAIN:  Are you having pain? Yes: NPRS scale: 1/10 Pain location: left LE Pain description: intermittent aching and throbbing Aggravating factors: walking, standing Relieving factors: sitting  PRECAUTIONS: None  RED FLAGS: None   WEIGHT BEARING RESTRICTIONS: No  FALLS:  Has patient fallen in last 6 months? No  LIVING ENVIRONMENT: Lives with: lives alone Lives in: House/apartment Stairs: Yes: Internal: "I avoid these steps" steps; on right going up Has following equipment at home: Quad cane large base  OCCUPATION: retired  PLOF: Independent with  basic ADLs  PATIENT GOALS: be able to stand and walk longer (she was able to walk at least 30 minutes before this pain began, but unable to do this currently) to be able to do her housework  NEXT MD VISIT: 01/03/23  OBJECTIVE:   PATIENT SURVEYS:  FOTO 51.87  SCREENING FOR RED FLAGS: Bowel or bladder incontinence: No Spinal tumors: No Cauda equina syndrome: No Compression fracture: No Abdominal aneurysm: No  COGNITION: Overall cognitive status: Within functional limits for tasks assessed     SENSATION: Patient reports no numbness or tingling  POSTURE: rounded shoulders, forward head, and flexed trunk (stands with 6 degrees of lumbar flexion)   PALPATION: TTP: bilateral lumbar paraspinals and QL  LUMBAR JOINT MOBILITY:  Hypomobile and painful throughout   LUMBAR ROM:   AROM eval  Flexion 40; prior to knee flexion   Extension 12; low back pain   Right lateral flexion 75% limited; prior to lumbar flexion and rotation  Left lateral flexion 75% limited; prior to lumbar flexion and rotation with familiar low back and left hip pain  Right rotation 50% limited  Left rotation 50% limited; familiar low back left hip pain   (Blank rows = not tested)  LOWER EXTREMITY ROM:  WFL for activities assessed  LOWER EXTREMITY MMT:    MMT Right eval Left eval  Hip flexion 3/5 3/5  Hip extension    Hip abduction    Hip adduction    Hip internal rotation    Hip external rotation    Knee flexion 4/5 4/5  Knee extension 3+/5 4-/5  Ankle dorsiflexion 3+/5 3+/5; familiar pain below her knee  Ankle plantarflexion    Ankle inversion    Ankle eversion     (Blank rows = not tested)  GAIT: Assistive device utilized: Quad cane large base Level of assistance: Modified independence Comments: decreased gait speed and stride length  Gait speed: 0.78 m/s  TODAY'S TREATMENT:                                                                                                                               DATE:01-24-23  EXERCISE LOG  Exercise Repetitions and Resistance Comments  Nustep L3 x 16 mins      Rockerboard    Standing Marches    LAQs 3# x25 reps bil   Seated Marches 3# x25 reps bil   Seated Hip Abduction Red 3 mins   Seated Hip Adduction Ball squeeze 3 mins   Seated Ham Curls Red x25 reps bil   STS X10 reps BUE support    Modalities  Date:  Unattended Estim: Lumbar, IFC 80-150 Hz, 15 mins, Pain Hot Pack: Lumbar, 15 mins, Pain and Tone  PATIENT EDUCATION:  Education details: POC, prognosis, healing, and goals for therapy  Person educated: Patient Education method: Explanation Education comprehension: verbalized understanding  HOME EXERCISE PROGRAM:   ASSESSMENT:  CLINICAL IMPRESSION:  Pt arrives for today's treatment session reporting 1/10 left LE pain.  Pt reports that left quad feels as if "it wants to cramp up," but it doesn't actually crap.  Pt able to tolerate increased weight with seated marches and LAQs today with fatigue noted.  Pt requiring BUE support for all STS transfers, but reports that she feels as if she is getting stronger.  Normal responses to esitm and MH noted upon removal.  Pt with notable fatigue at completion of today's treatment session, but denies any pain.   OBJECTIVE IMPAIRMENTS: Abnormal gait, decreased activity tolerance, decreased mobility, difficulty walking, decreased ROM, decreased strength, hypomobility, impaired tone, postural dysfunction, and pain.   ACTIVITY LIMITATIONS: carrying, lifting, bending, standing, stairs, and locomotion level  PARTICIPATION LIMITATIONS: meal prep, cleaning, laundry, shopping, and community activity  PERSONAL FACTORS: Age, Time since onset of injury/illness/exacerbation, and 3+ comorbidities: Hypertension, osteoporosis, arthritis, and chronic kidney disease  are also affecting patient's functional outcome.   REHAB POTENTIAL: Good  CLINICAL DECISION MAKING:  Evolving/moderate complexity  EVALUATION COMPLEXITY: Moderate   GOALS: Goals reviewed with patient? Yes  SHORT TERM GOALS: Target date: 01/23/23  Patient will be independent with her initial HEP.  Baseline: Goal status: MET  2.  Patient will be able to complete her daily activities without her familiar pain exceeding 5/10. Baseline:  Goal status: MET  3.  Patient will be able to stand and walk for at least 15 minutes without being limited by her familiar symptoms.  Baseline:  Goal status: On going  LONG TERM GOALS: Target date: 02/14/23  Patient will be independent with her advanced HEP.  Baseline:  Goal status: INITIAL  2.  Patient will improve her gait speed to at least 1.0 m/s for improved community mobility.  Baseline:  Goal status: INITIAL  3.  Patient will be able to demonstrate proper lifting mechanics for improved function with household activities.  Baseline:  Goal status: INITIAL  4.  Patient will be able to walk for at least 30 minutes without being limited by her familiar symptoms.  Baseline:  Goal status: INITIAL  PLAN:  PT FREQUENCY: 2x/week  PT DURATION: 6 weeks  PLANNED INTERVENTIONS: Therapeutic exercises, Therapeutic activity, Neuromuscular re-education, Balance training, Gait training, Patient/Family education, Self Care, Joint mobilization, Stair training, Electrical stimulation, Spinal mobilization, Cryotherapy, Moist heat, Manual therapy, and Re-evaluation.  PLAN FOR NEXT SESSION: nustep, lower extremity and lumbar strengthening, bridging, , manual therapy, and modalities as needed   Newman Pies, PTA 01/24/2023, 11:34 AM

## 2023-01-28 ENCOUNTER — Ambulatory Visit (INDEPENDENT_AMBULATORY_CARE_PROVIDER_SITE_OTHER): Payer: Medicare Other

## 2023-01-28 DIAGNOSIS — M810 Age-related osteoporosis without current pathological fracture: Secondary | ICD-10-CM

## 2023-01-28 MED ORDER — DENOSUMAB 60 MG/ML ~~LOC~~ SOSY
60.0000 mg | PREFILLED_SYRINGE | Freq: Once | SUBCUTANEOUS | Status: AC
Start: 2023-01-28 — End: 2023-01-28
  Administered 2023-01-28: 60 mg via SUBCUTANEOUS

## 2023-01-28 NOTE — Progress Notes (Signed)
Prolia injection given to left upper arm.  Patient tolerated well.  Buy & bill

## 2023-01-29 ENCOUNTER — Ambulatory Visit: Payer: Medicare Other

## 2023-01-29 DIAGNOSIS — M62830 Muscle spasm of back: Secondary | ICD-10-CM | POA: Diagnosis not present

## 2023-01-29 DIAGNOSIS — M6281 Muscle weakness (generalized): Secondary | ICD-10-CM | POA: Diagnosis not present

## 2023-01-29 DIAGNOSIS — M5416 Radiculopathy, lumbar region: Secondary | ICD-10-CM

## 2023-01-29 NOTE — Therapy (Signed)
OUTPATIENT PHYSICAL THERAPY THORACOLUMBAR TREATMENT   Patient Name: Cheryl Lee MRN: [id number] DOB:09-Jun-1945, 77 y.o., female Today's Date: 01/29/2023  END OF SESSION:  PT End of Session - 01/29/23 0939     Visit Number 8    Number of Visits 12    Date for PT Re-Evaluation 03/08/23    PT Start Time 0930    PT Stop Time 1030    PT Time Calculation (min) 60 min             Past Medical History:  Diagnosis Date   Acid reflux    Arthritis    Dyspnea    HNP (herniated nucleus pulposus), lumbar    Hypercholesteremia    Hypertension    Osteoporosis    Sinus congestion    Past Surgical History:  Procedure Laterality Date   BIOPSY  08/23/2022   Procedure: BIOPSY;  Surgeon: Lemar Lofty., MD;  Location: [facility];  Service: Gastroenterology;;   CATARACT EXTRACTION     CHOLECYSTECTOMY N/A 08/23/2022   Procedure: LAPAROSCOPIC CHOLECYSTECTOMY;  Surgeon: Fritzi Mandes, MD;  Location: [facility];  Service: General;  Laterality: N/A;   COLONOSCOPY     COLONOSCOPY N/A 01/21/2020   Procedure: COLONOSCOPY;  Surgeon: Malissa Hippo, MD;  Location: [facility];  Service: Endoscopy;  Laterality: N/A;  730   ERCP N/A 08/23/2022   Procedure: ENDOSCOPIC RETROGRADE CHOLANGIOPANCREATOGRAPHY (ERCP);  Surgeon: Lemar Lofty., MD;  Location: [facility];  Service: Gastroenterology;  Laterality: N/A;   LUMBAR LAMINECTOMY/DECOMPRESSION MICRODISCECTOMY Left 05/01/2017   Procedure: Microdiscectomy - Lumbar four-five  - left;  Surgeon: Donalee Citrin, MD;  Location: [facility];  Service: Neurosurgery;  Laterality: Left;   POLYPECTOMY  01/21/2020   Procedure: POLYPECTOMY;  Surgeon: Malissa Hippo, MD;  [facility];  Service: Endoscopy;;   REMOVAL OF STONES  08/23/2022   Procedure: REMOVAL OF STONES;  Surgeon: Lemar Lofty., MD;  Location: [facility];  Service: Gastroenterology;;   Right snkle repair     SPHINCTEROTOMY  08/23/2022   Procedure: Dennison Mascot;  Surgeon:  Lemar Lofty., MD;  Location: [facility];  Service: Gastroenterology;;   Patient Active Problem List   Diagnosis Date Noted   Lumbar compression fracture (HCC) 07/05/2022   Leukocytosis 07/05/2022   Stage 3a chronic kidney disease (CKD) (HCC) 07/05/2022   Lumbar nerve root impingement 07/08/2018   DDD (degenerative disc disease), lumbar 07/08/2018   Vitamin D deficiency 03/03/2018   HNP (herniated nucleus pulposus), lumbar 05/01/2017   Overweight (BMI 25.0-29.9) 12/21/2014   GERD (gastroesophageal reflux disease)    Hyperlipidemia with target LDL less than 100    Hypertension    Osteoporosis     PCP: Junie Spencer, FNP  REFERRING PROVIDER: Donalee Citrin, MD   REFERRING DIAG: Wedge compression fracture of fourth lumbar vertebra, initial encounter for closed fracture   Rationale for Evaluation and Treatment: Rehabilitation  THERAPY DIAG:  Radiculopathy, lumbar region  Muscle weakness (generalized)  Muscle spasm of back  ONSET DATE: "a while now"   SUBJECTIVE:  SUBJECTIVE STATEMENT: Pt reports minimal low back pain today.   PERTINENT HISTORY:  Hypertension, osteoporosis, arthritis, and chronic kidney disease  PAIN:  Are you having pain? Yes: NPRS scale: 1/10 Pain location: low back Pain description: intermittent aching and throbbing Aggravating factors: walking, standing Relieving factors: sitting  PRECAUTIONS: None  RED FLAGS: None   WEIGHT BEARING RESTRICTIONS: No  FALLS:  Has patient fallen in last 6 months? No  LIVING ENVIRONMENT: Lives with: lives alone Lives in: House/apartment Stairs: Yes: Internal: "I avoid these steps" steps; on right going up Has following equipment at home: Quad cane large base  OCCUPATION: retired  PLOF: Independent with basic  ADLs  PATIENT GOALS: be able to stand and walk longer (she was able to walk at least 30 minutes before this pain began, but unable to do this currently) to be able to do her housework  NEXT MD VISIT: 01/03/23  OBJECTIVE:   PATIENT SURVEYS:  FOTO 51.87  SCREENING FOR RED FLAGS: Bowel or bladder incontinence: No Spinal tumors: No Cauda equina syndrome: No Compression fracture: No Abdominal aneurysm: No  COGNITION: Overall cognitive status: Within functional limits for tasks assessed     SENSATION: Patient reports no numbness or tingling  POSTURE: rounded shoulders, forward head, and flexed trunk (stands with 6 degrees of lumbar flexion)   PALPATION: TTP: bilateral lumbar paraspinals and QL  LUMBAR JOINT MOBILITY:  Hypomobile and painful throughout   LUMBAR ROM:   AROM eval  Flexion 40; prior to knee flexion   Extension 12; low back pain   Right lateral flexion 75% limited; prior to lumbar flexion and rotation  Left lateral flexion 75% limited; prior to lumbar flexion and rotation with familiar low back and left hip pain  Right rotation 50% limited  Left rotation 50% limited; familiar low back left hip pain   (Blank rows = not tested)  LOWER EXTREMITY ROM:  WFL for activities assessed  LOWER EXTREMITY MMT:    MMT Right eval Left eval  Hip flexion 3/5 3/5  Hip extension    Hip abduction    Hip adduction    Hip internal rotation    Hip external rotation    Knee flexion 4/5 4/5  Knee extension 3+/5 4-/5  Ankle dorsiflexion 3+/5 3+/5; familiar pain below her knee  Ankle plantarflexion    Ankle inversion    Ankle eversion     (Blank rows = not tested)  GAIT: Assistive device utilized: Quad cane large base Level of assistance: Modified independence Comments: decreased gait speed and stride length  Gait speed: 0.78 m/s  TODAY'S TREATMENT:                                                                                                                               DATE:01-29-23  EXERCISE LOG  Exercise Repetitions and Resistance Comments  Nustep L4 x 16 mins      Rockerboard 4 mins   Standing Marches Airex 3 mins   LAQs 3# x30 reps bil   Seated Marches 3# x30 reps bil   Seated Hip Abduction Red 3 mins   Seated Hip Adduction Ball squeeze 3.5 mins   Seated Ham Curls Red x25 reps bil   STS X10 reps BUE support    Modalities  Date:  Unattended Estim: Lumbar, IFC 80-150 Hz, 15 mins, Pain Hot Pack: Lumbar, 15 mins, Pain and Tone  PATIENT EDUCATION:  Education details: POC, prognosis, healing, and goals for therapy  Person educated: Patient Education method: Explanation Education comprehension: verbalized understanding  HOME EXERCISE PROGRAM:   ASSESSMENT:  CLINICAL IMPRESSION:  Pt arrives for today's treatment session reporting 1/10 low back pain.  Pt able to increase FOTO score to 60 today.  Pt able to tolerate increase reps or time with all exercises performed today.  Pt reports that with certain motions her left hamstring feels as if it "want to cramp," but she is able to avoid a cramp actually happening.  Normal responses to estim and MH noted upon removal.  Pt denied any pain at completion of today's treatment session.  OBJECTIVE IMPAIRMENTS: Abnormal gait, decreased activity tolerance, decreased mobility, difficulty walking, decreased ROM, decreased strength, hypomobility, impaired tone, postural dysfunction, and pain.   ACTIVITY LIMITATIONS: carrying, lifting, bending, standing, stairs, and locomotion level  PARTICIPATION LIMITATIONS: meal prep, cleaning, laundry, shopping, and community activity  PERSONAL FACTORS: Age, Time since onset of injury/illness/exacerbation, and 3+ comorbidities: Hypertension, osteoporosis, arthritis, and chronic kidney disease  are also affecting patient's functional outcome.   REHAB POTENTIAL: Good  CLINICAL DECISION MAKING: Evolving/moderate complexity  EVALUATION  COMPLEXITY: Moderate   GOALS: Goals reviewed with patient? Yes  SHORT TERM GOALS: Target date: 01/23/23  Patient will be independent with her initial HEP.  Baseline: Goal status: MET  2.  Patient will be able to complete her daily activities without her familiar pain exceeding 5/10. Baseline:  Goal status: MET  3.  Patient will be able to stand and walk for at least 15 minutes without being limited by her familiar symptoms.  Baseline:  Goal status: On going  LONG TERM GOALS: Target date: 02/14/23  Patient will be independent with her advanced HEP.  Baseline:  Goal status: IN PROGRESS  2.  Patient will improve her gait speed to at least 1.0 m/s for improved community mobility.  Baseline:  Goal status: IN PROGRESS  3.  Patient will be able to demonstrate proper lifting mechanics for improved function with household activities.  Baseline:  Goal status: IN PROGRESS  4.  Patient will be able to walk for at least 30 minutes without being limited by her familiar symptoms.  Baseline:  Goal status: IN PROGRESS  PLAN:  PT FREQUENCY: 2x/week  PT DURATION: 6 weeks  PLANNED INTERVENTIONS: Therapeutic exercises, Therapeutic activity, Neuromuscular re-education, Balance training, Gait training, Patient/Family education, Self Care, Joint mobilization, Stair training, Electrical stimulation, Spinal mobilization, Cryotherapy, Moist heat, Manual therapy, and Re-evaluation.  PLAN FOR NEXT SESSION: nustep, lower extremity and lumbar strengthening, bridging, , manual therapy, and modalities as needed   Newman Pies, PTA 01/29/2023, 11:11 AM

## 2023-01-31 ENCOUNTER — Ambulatory Visit: Payer: Medicare Other

## 2023-01-31 DIAGNOSIS — M5416 Radiculopathy, lumbar region: Secondary | ICD-10-CM

## 2023-01-31 DIAGNOSIS — M62830 Muscle spasm of back: Secondary | ICD-10-CM | POA: Diagnosis not present

## 2023-01-31 DIAGNOSIS — M6281 Muscle weakness (generalized): Secondary | ICD-10-CM | POA: Diagnosis not present

## 2023-01-31 NOTE — Therapy (Signed)
OUTPATIENT PHYSICAL THERAPY THORACOLUMBAR TREATMENT   Patient Name: Cheryl Lee MRN: [id number] DOB:Jan 14, 1946, 77 y.o., female Today's Date: 01/31/2023  END OF SESSION:  PT End of Session - 01/31/23 0934     Visit Number 9    Number of Visits 12    Date for PT Re-Evaluation 03/08/23    PT Start Time 0930    PT Stop Time 1015    PT Time Calculation (min) 45 min    Activity Tolerance Patient tolerated treatment well    Behavior During Therapy [facility] for tasks assessed/performed              Past Medical History:  Diagnosis Date   Acid reflux    Arthritis    Dyspnea    HNP (herniated nucleus pulposus), lumbar    Hypercholesteremia    Hypertension    Osteoporosis    Sinus congestion    Past Surgical History:  Procedure Laterality Date   BIOPSY  08/23/2022   Procedure: BIOPSY;  Surgeon: Lemar Lofty., MD;  Location: [facility];  Service: Gastroenterology;;   CATARACT EXTRACTION     CHOLECYSTECTOMY N/A 08/23/2022   Procedure: LAPAROSCOPIC CHOLECYSTECTOMY;  Surgeon: Fritzi Mandes, MD;  Location: [facility];  Service: General;  Laterality: N/A;   COLONOSCOPY     COLONOSCOPY N/A 01/21/2020   Procedure: COLONOSCOPY;  Surgeon: Malissa Hippo, MD;  Location: [facility];  Service: Endoscopy;  Laterality: N/A;  730   ERCP N/A 08/23/2022   Procedure: ENDOSCOPIC RETROGRADE CHOLANGIOPANCREATOGRAPHY (ERCP);  Surgeon: Lemar Lofty., MD;  Location: [facility];  Service: Gastroenterology;  Laterality: N/A;   LUMBAR LAMINECTOMY/DECOMPRESSION MICRODISCECTOMY Left 05/01/2017   Procedure: Microdiscectomy - Lumbar four-five  - left;  Surgeon: Donalee Citrin, MD;  Location: [facility] OR;  Service: Neurosurgery;  Laterality: Left;   POLYPECTOMY  01/21/2020   Procedure: POLYPECTOMY;  Surgeon: Malissa Hippo, MD;  Location: [facility];  Service: Endoscopy;;   REMOVAL OF STONES  08/23/2022   Procedure: REMOVAL OF STONES;  Surgeon: Lemar Lofty., MD;  Location: [facility];  Service:  Gastroenterology;;   Right snkle repair     SPHINCTEROTOMY  08/23/2022   Procedure: Dennison Mascot;  Surgeon: Lemar Lofty., MD;  Location: [facility];  Service: Gastroenterology;;   Patient Active Problem List   Diagnosis Date Noted   Lumbar compression fracture (HCC) 07/05/2022   Leukocytosis 07/05/2022   Stage 3a chronic kidney disease (CKD) (HCC) 07/05/2022   Lumbar nerve root impingement 07/08/2018   DDD (degenerative disc disease), lumbar 07/08/2018   Vitamin D deficiency 03/03/2018   HNP (herniated nucleus pulposus), lumbar 05/01/2017   Overweight (BMI 25.0-29.9) 12/21/2014   GERD (gastroesophageal reflux disease)    Hyperlipidemia with target LDL less than 100    Hypertension    Osteoporosis     PCP: Junie Spencer, FNP  REFERRING PROVIDER: Donalee Citrin, MD   REFERRING DIAG: Wedge compression fracture of fourth lumbar vertebra, initial encounter for closed fracture   Rationale for Evaluation and Treatment: Rehabilitation  THERAPY DIAG:  Radiculopathy, lumbar region  Muscle weakness (generalized)  ONSET DATE: "a while now"   SUBJECTIVE:  SUBJECTIVE STATEMENT: Patient reports that she is "hurting just enough to know it's there" today.   PERTINENT HISTORY:  Hypertension, osteoporosis, arthritis, and chronic kidney disease  PAIN:  Are you having pain? Yes: NPRS scale: 1/10 Pain location: low back Pain description: intermittent aching and throbbing Aggravating factors: walking, standing Relieving factors: sitting  PRECAUTIONS: None  RED FLAGS: None   WEIGHT BEARING RESTRICTIONS: No  FALLS:  Has patient fallen in last 6 months? No  LIVING ENVIRONMENT: Lives with: lives alone Lives in: House/apartment Stairs: Yes: Internal: "I avoid these steps" steps; on right  going up Has following equipment at home: Quad cane large base  OCCUPATION: retired  PLOF: Independent with basic ADLs  PATIENT GOALS: be able to stand and walk longer (she was able to walk at least 30 minutes before this pain began, but unable to do this currently) to be able to do her housework  NEXT MD VISIT: 01/03/23  OBJECTIVE:   PATIENT SURVEYS:  FOTO 51.87  SCREENING FOR RED FLAGS: Bowel or bladder incontinence: No Spinal tumors: No Cauda equina syndrome: No Compression fracture: No Abdominal aneurysm: No  COGNITION: Overall cognitive status: Within functional limits for tasks assessed     SENSATION: Patient reports no numbness or tingling  POSTURE: rounded shoulders, forward head, and flexed trunk (stands with 6 degrees of lumbar flexion)   PALPATION: TTP: bilateral lumbar paraspinals and QL  LUMBAR JOINT MOBILITY:  Hypomobile and painful throughout   LUMBAR ROM:   AROM eval  Flexion 40; prior to knee flexion   Extension 12; low back pain   Right lateral flexion 75% limited; prior to lumbar flexion and rotation  Left lateral flexion 75% limited; prior to lumbar flexion and rotation with familiar low back and left hip pain  Right rotation 50% limited  Left rotation 50% limited; familiar low back left hip pain   (Blank rows = not tested)  LOWER EXTREMITY ROM:  WFL for activities assessed  LOWER EXTREMITY MMT:    MMT Right eval Left eval  Hip flexion 3/5 3/5  Hip extension    Hip abduction    Hip adduction    Hip internal rotation    Hip external rotation    Knee flexion 4/5 4/5  Knee extension 3+/5 4-/5  Ankle dorsiflexion 3+/5 3+/5; familiar pain below her knee  Ankle plantarflexion    Ankle inversion    Ankle eversion     (Blank rows = not tested)  GAIT: Assistive device utilized: Quad cane large base Level of assistance: Modified independence Comments: decreased gait speed and stride length  Gait speed: 0.78 m/s  TODAY'S TREATMENT:                                                                                                                               DATE:  01/31/23 EXERCISE LOG  Exercise Repetitions and Resistance Comments  Nustep  L4 x 17 minutes   Rocker board  4 minutes   Isometric ball press  30 reps  Required maximal verbal and tactile cueing to limit trunk mobility   LAQ  3# x 3 minutes  Alternating LE every 10 reps   Seated hip ADD isometric  3 minutes w/ 5 second hold    Seated marching  3# x 2 minutes  Alternating LE   Seated HS curl  Red t-band x 1.5 minutes each     Blank cell = exercise not performed today                                     01-29-23 EXERCISE LOG  Exercise Repetitions and Resistance Comments  Nustep L4 x 16 mins      Rockerboard 4 mins   Standing Marches Airex 3 mins   LAQs 3# x30 reps bil   Seated Marches 3# x30 reps bil   Seated Hip Abduction Red 3 mins   Seated Hip Adduction Ball squeeze 3.5 mins   Seated Ham Curls Red x25 reps bil   STS X10 reps BUE support    Modalities  Date:  Unattended Estim: Lumbar, IFC 80-150 Hz, 15 mins, Pain Hot Pack: Lumbar, 15 mins, Pain and Tone  PATIENT EDUCATION:  Education details: POC, prognosis, healing, and goals for therapy  Person educated: Patient Education method: Explanation Education comprehension: verbalized understanding  HOME EXERCISE PROGRAM:   ASSESSMENT:  CLINICAL IMPRESSION: Patient was introduced to an isometric ball press for lumbar stability. However, she required significant multimodal cueing with this and today's other interventions for proper exercise performance and a slow and controlled movement pattern to limit altered movement patterns. She experienced no increase in pain or discomfort with any of today's interventions. However, fatigue was her primary limitation with today's interventions as evidenced her reduced arc of motion. She reported feeling tired upon the conclusion of  treatment. Recommend that she continue with skilled physical therapy to address her remaining impairments to maximize her functional mobility.    OBJECTIVE IMPAIRMENTS: Abnormal gait, decreased activity tolerance, decreased mobility, difficulty walking, decreased ROM, decreased strength, hypomobility, impaired tone, postural dysfunction, and pain.   ACTIVITY LIMITATIONS: carrying, lifting, bending, standing, stairs, and locomotion level  PARTICIPATION LIMITATIONS: meal prep, cleaning, laundry, shopping, and community activity  PERSONAL FACTORS: Age, Time since onset of injury/illness/exacerbation, and 3+ comorbidities: Hypertension, osteoporosis, arthritis, and chronic kidney disease  are also affecting patient's functional outcome.   REHAB POTENTIAL: Good  CLINICAL DECISION MAKING: Evolving/moderate complexity  EVALUATION COMPLEXITY: Moderate   GOALS: Goals reviewed with patient? Yes  SHORT TERM GOALS: Target date: 01/23/23  Patient will be independent with her initial HEP.  Baseline: Goal status: MET  2.  Patient will be able to complete her daily activities without her familiar pain exceeding 5/10. Baseline:  Goal status: MET  3.  Patient will be able to stand and walk for at least 15 minutes without being limited by her familiar symptoms.  Baseline:  Goal status: On going  LONG TERM GOALS: Target date: 02/14/23  Patient will be independent with her advanced HEP.  Baseline:  Goal status: IN PROGRESS  2.  Patient will improve her gait speed to at least 1.0 m/s for improved community mobility.  Baseline:  Goal status: IN PROGRESS  3.  Patient will be able to  demonstrate proper lifting mechanics for improved function with household activities.  Baseline:  Goal status: IN PROGRESS  4.  Patient will be able to walk for at least 30 minutes without being limited by her familiar symptoms.  Baseline:  Goal status: IN PROGRESS  PLAN:  PT FREQUENCY: 2x/week  PT DURATION:  6 weeks  PLANNED INTERVENTIONS: Therapeutic exercises, Therapeutic activity, Neuromuscular re-education, Balance training, Gait training, Patient/Family education, Self Care, Joint mobilization, Stair training, Electrical stimulation, Spinal mobilization, Cryotherapy, Moist heat, Manual therapy, and Re-evaluation.  PLAN FOR NEXT SESSION: progress report   Granville Lewis, PT 01/31/2023, 10:33 AM

## 2023-02-04 ENCOUNTER — Ambulatory Visit: Payer: Medicare Other | Admitting: Physical Therapy

## 2023-02-04 DIAGNOSIS — M6281 Muscle weakness (generalized): Secondary | ICD-10-CM

## 2023-02-04 DIAGNOSIS — M62830 Muscle spasm of back: Secondary | ICD-10-CM

## 2023-02-04 DIAGNOSIS — M5416 Radiculopathy, lumbar region: Secondary | ICD-10-CM | POA: Diagnosis not present

## 2023-02-04 NOTE — Therapy (Signed)
OUTPATIENT PHYSICAL THERAPY THORACOLUMBAR TREATMENT   Patient Name: Cheryl Lee MRN: [id number] DOB:01-08-1946, 77 y.o., female Today's Date: 02/04/2023  END OF SESSION:  PT End of Session - 02/04/23 1012     Visit Number 10    Number of Visits 12    Date for PT Re-Evaluation 03/08/23    PT Start Time 0930    PT Stop Time 1021    PT Time Calculation (min) 51 min    Activity Tolerance Patient tolerated treatment well    Behavior During Therapy WFL for tasks assessed/performed              Past Medical History:  Diagnosis Date   Acid reflux    Arthritis    Dyspnea    HNP (herniated nucleus pulposus), lumbar    Hypercholesteremia    Hypertension    Osteoporosis    Sinus congestion    Past Surgical History:  Procedure Laterality Date   BIOPSY  08/23/2022   Procedure: BIOPSY;  Surgeon: Lemar Lofty., MD;  Location: [facility];  Service: Gastroenterology;;   CATARACT EXTRACTION     CHOLECYSTECTOMY N/A 08/23/2022   Procedure: LAPAROSCOPIC CHOLECYSTECTOMY;  Surgeon: Fritzi Mandes, MD;  Location: [facility];  Service: General;  Laterality: N/A;   COLONOSCOPY     COLONOSCOPY N/A 01/21/2020   Procedure: COLONOSCOPY;  Surgeon: Malissa Hippo, MD;  [facility]: [facility];  Service: Endoscopy;  Laterality: N/A;  730   ERCP N/A 08/23/2022   Procedure: ENDOSCOPIC RETROGRADE CHOLANGIOPANCREATOGRAPHY (ERCP);  Surgeon: Lemar Lofty., MD;  Location: [facility];  Service: Gastroenterology;  Laterality: N/A;   LUMBAR LAMINECTOMY/DECOMPRESSION MICRODISCECTOMY Left 05/01/2017   Procedure: Microdiscectomy - Lumbar four-five  - left;  Surgeon: Donalee Citrin, MD;  Location: [facility] OR;  Service: Neurosurgery;  Laterality: Left;   POLYPECTOMY  01/21/2020   Procedure: POLYPECTOMY;  Surgeon: Malissa Hippo, MD;  [facility];  Service: Endoscopy;;   REMOVAL OF STONES  08/23/2022   Procedure: REMOVAL OF STONES;  Surgeon: Lemar Lofty., MD;  Location: [facility];  Service:  Gastroenterology;;   Right snkle repair     SPHINCTEROTOMY  08/23/2022   Procedure: Dennison Mascot;  Surgeon: Lemar Lofty., MD;  Location: [facility];  Service: Gastroenterology;;   Patient Active Problem List   Diagnosis Date Noted   Lumbar compression fracture (HCC) 07/05/2022   Leukocytosis 07/05/2022   Stage 3a chronic kidney disease (CKD) (HCC) 07/05/2022   Lumbar nerve root impingement 07/08/2018   DDD (degenerative disc disease), lumbar 07/08/2018   Vitamin D deficiency 03/03/2018   HNP (herniated nucleus pulposus), lumbar 05/01/2017   Overweight (BMI 25.0-29.9) 12/21/2014   GERD (gastroesophageal reflux disease)    Hyperlipidemia with target LDL less than 100    Hypertension    Osteoporosis     PCP: Junie Spencer, FNP  REFERRING PROVIDER: Donalee Citrin, MD   REFERRING DIAG: Wedge compression fracture of fourth lumbar vertebra, initial encounter for closed fracture   Rationale for Evaluation and Treatment: Rehabilitation  THERAPY DIAG:  Radiculopathy, lumbar region  Muscle weakness (generalized)  Muscle spasm of back  ONSET DATE: "a while now"   SUBJECTIVE:  SUBJECTIVE STATEMENT: The patient reports the injection she received on 9/5 was helpful.  Now having more pain on right.  No significant pain with sitting but increasing pain when up and moving/walking.  PERTINENT HISTORY:  Hypertension, osteoporosis, arthritis, and chronic kidney disease  PAIN:  Are you having pain? Yes: NPRS scale: 1/10 Pain location: low back Pain description: intermittent aching and throbbing Aggravating factors: walking, standing Relieving factors: sitting  PRECAUTIONS: None  RED FLAGS: None   WEIGHT BEARING RESTRICTIONS: No  FALLS:  Has patient fallen in last 6 months? No  LIVING  ENVIRONMENT: Lives with: lives alone Lives in: House/apartment Stairs: Yes: Internal: "I avoid these steps" steps; on right going up Has following equipment at home: Quad cane large base  OCCUPATION: retired  PLOF: Independent with basic ADLs  PATIENT GOALS: be able to stand and walk longer (she was able to walk at least 30 minutes before this pain began, but unable to do this currently) to be able to do her housework  NEXT MD VISIT: 01/03/23  OBJECTIVE:   PATIENT SURVEYS:  FOTO 51.87  SCREENING FOR RED FLAGS: Bowel or bladder incontinence: No Spinal tumors: No Cauda equina syndrome: No Compression fracture: No Abdominal aneurysm: No  COGNITION: Overall cognitive status: Within functional limits for tasks assessed     SENSATION: Patient reports no numbness or tingling  POSTURE: rounded shoulders, forward head, and flexed trunk (stands with 6 degrees of lumbar flexion)   PALPATION: TTP: bilateral lumbar paraspinals and QL  LUMBAR JOINT MOBILITY:  Hypomobile and painful throughout   LUMBAR ROM:   AROM eval  Flexion 40; prior to knee flexion   Extension 12; low back pain   Right lateral flexion 75% limited; prior to lumbar flexion and rotation  Left lateral flexion 75% limited; prior to lumbar flexion and rotation with familiar low back and left hip pain  Right rotation 50% limited  Left rotation 50% limited; familiar low back left hip pain   (Blank rows = not tested)  LOWER EXTREMITY ROM:  WFL for activities assessed  LOWER EXTREMITY MMT:    MMT Right eval Left eval  Hip flexion 3/5 3/5  Hip extension    Hip abduction    Hip adduction    Hip internal rotation    Hip external rotation    Knee flexion 4/5 4/5  Knee extension 3+/5 4-/5  Ankle dorsiflexion 3+/5 3+/5; familiar pain below her knee  Ankle plantarflexion    Ankle inversion    Ankle eversion     (Blank rows = not tested)  GAIT: Assistive device utilized: Quad cane large base Level of  assistance: Modified independence Comments: decreased gait speed and stride length  Gait speed: 0.78 m/s  TODAY'S TREATMENT:                                                                                                                              DATE:  02/04/23:  EXERCISE LOG  Exercise Repetitions and Resistance Comments  Nustep Level 3 x 16 minutes                   STW/M x 8 minutes to patient's right lumbar musculature with ischemic release technique utilized f/b  HMP and IFC at 80-150 Hz on 40% scan x 20 minutes.  Normal modality response following removal of modality.                                   01/31/23 EXERCISE LOG  Exercise Repetitions and Resistance Comments  Nustep  L4 x 17 minutes   Rocker board  4 minutes   Isometric ball press  30 reps  Required maximal verbal and tactile cueing to limit trunk mobility   LAQ  3# x 3 minutes  Alternating LE every 10 reps   Seated hip ADD isometric  3 minutes w/ 5 second hold    Seated marching  3# x 2 minutes  Alternating LE   Seated HS curl  Red t-band x 1.5 minutes each     Blank cell = exercise not performed today                                     01-29-23 EXERCISE LOG  Exercise Repetitions and Resistance Comments  Nustep L4 x 16 mins      Rockerboard 4 mins   Standing Marches Airex 3 mins   LAQs 3# x30 reps bil   Seated Marches 3# x30 reps bil   Seated Hip Abduction Red 3 mins   Seated Hip Adduction Ball squeeze 3.5 mins   Seated Ham Curls Red x25 reps bil   STS X10 reps BUE support    Modalities  Date:  Unattended Estim: Lumbar, IFC 80-150 Hz, 15 mins, Pain Hot Pack: Lumbar, 15 mins, Pain and Tone  PATIENT EDUCATION:  Education details: POC, prognosis, healing, and goals for therapy  Person educated: Patient Education method: Explanation Education comprehension: verbalized understanding  HOME EXERCISE PROGRAM:   ASSESSMENT:  CLINICAL IMPRESSION: Patient's right  lumbar musculature exhibited significant tone.  She had a good response to STW/M to this region with ischemic release technique utilized.    OBJECTIVE IMPAIRMENTS: Abnormal gait, decreased activity tolerance, decreased mobility, difficulty walking, decreased ROM, decreased strength, hypomobility, impaired tone, postural dysfunction, and pain.   ACTIVITY LIMITATIONS: carrying, lifting, bending, standing, stairs, and locomotion level  PARTICIPATION LIMITATIONS: meal prep, cleaning, laundry, shopping, and community activity  PERSONAL FACTORS: Age, Time since onset of injury/illness/exacerbation, and 3+ comorbidities: Hypertension, osteoporosis, arthritis, and chronic kidney disease  are also affecting patient's functional outcome.   REHAB POTENTIAL: Good  CLINICAL DECISION MAKING: Evolving/moderate complexity  EVALUATION COMPLEXITY: Moderate   GOALS: Goals reviewed with patient? Yes  SHORT TERM GOALS: Target date: 01/23/23  Patient will be independent with her initial HEP.  Baseline: Goal status: MET  2.  Patient will be able to complete her daily activities without her familiar pain exceeding 5/10. Baseline:  Goal status: MET  3.  Patient will be able to stand and walk for at least 15 minutes without being limited by her familiar symptoms.  Baseline:  Goal status: On going  LONG TERM GOALS: Target date: 02/14/23  Patient will be independent with her advanced HEP.  Baseline:  Goal status: IN PROGRESS  2.  Patient will improve her gait speed to at least 1.0 m/s for improved community mobility.  Baseline:  Goal status: IN PROGRESS  3.  Patient will be able to demonstrate proper lifting mechanics for improved function with household activities.  Baseline:  Goal status: IN PROGRESS  4.  Patient will be able to walk for at least 30 minutes without being limited by her familiar symptoms.  Baseline:  Goal status: IN PROGRESS  PLAN:  PT FREQUENCY: 2x/week  PT DURATION: 6  weeks  PLANNED INTERVENTIONS: Therapeutic exercises, Therapeutic activity, Neuromuscular re-education, Balance training, Gait training, Patient/Family education, Self Care, Joint mobilization, Stair training, Electrical stimulation, Spinal mobilization, Cryotherapy, Moist heat, Manual therapy, and Re-evaluation.  PLAN FOR NEXT SESSION: progress report  Progress Note Reporting Period 01/02/23 to 02/04/23  See note below for Objective Data and Assessment of Progress/Goals. Increased pain in right low back pain but better on left since receiving an injection on 01/17/23.  Goals are ongoing.      Breken Nazari, [place], PT 02/04/2023, 11:01 AM

## 2023-02-06 ENCOUNTER — Ambulatory Visit: Payer: Medicare Other

## 2023-02-06 DIAGNOSIS — M6281 Muscle weakness (generalized): Secondary | ICD-10-CM

## 2023-02-06 DIAGNOSIS — M62830 Muscle spasm of back: Secondary | ICD-10-CM

## 2023-02-06 DIAGNOSIS — M5416 Radiculopathy, lumbar region: Secondary | ICD-10-CM

## 2023-02-06 NOTE — Therapy (Signed)
OUTPATIENT PHYSICAL THERAPY THORACOLUMBAR TREATMENT   Patient Name: Cheryl Lee MRN: [id number] DOB:02-01-1946, 77 y.o., female Today's Date: 02/06/2023  END OF SESSION:  PT End of Session - 02/06/23 0935     Visit Number 11    Number of Visits 12    Date for PT Re-Evaluation 03/08/23    PT Start Time 0930    PT Stop Time 1025    PT Time Calculation (min) 55 min    Activity Tolerance Patient tolerated treatment well    Behavior During Therapy [facility] for tasks assessed/performed              Past Medical History:  Diagnosis Date   Acid reflux    Arthritis    Dyspnea    HNP (herniated nucleus pulposus), lumbar    Hypercholesteremia    Hypertension    Osteoporosis    Sinus congestion    Past Surgical History:  Procedure Laterality Date   BIOPSY  08/23/2022   Procedure: BIOPSY;  Surgeon: Lemar Lofty., MD;  Location: [facility];  Service: Gastroenterology;;   CATARACT EXTRACTION     CHOLECYSTECTOMY N/A 08/23/2022   Procedure: LAPAROSCOPIC CHOLECYSTECTOMY;  Surgeon: Fritzi Mandes, MD;  Location: [facility];  Service: General;  Laterality: N/A;   COLONOSCOPY     COLONOSCOPY N/A 01/21/2020   Procedure: COLONOSCOPY;  Surgeon: Malissa Hippo, MD;  [facility]: [facility];  Service: Endoscopy;  Laterality: N/A;  730   ERCP N/A 08/23/2022   Procedure: ENDOSCOPIC RETROGRADE CHOLANGIOPANCREATOGRAPHY (ERCP);  Surgeon: Lemar Lofty., MD;  Location: [facility];  Service: Gastroenterology;  Laterality: N/A;   LUMBAR LAMINECTOMY/DECOMPRESSION MICRODISCECTOMY Left 05/01/2017   Procedure: Microdiscectomy - Lumbar four-five  - left;  Surgeon: Donalee Citrin, MD;  Location: [facility];  Service: Neurosurgery;  Laterality: Left;   POLYPECTOMY  01/21/2020   Procedure: POLYPECTOMY;  Surgeon: Malissa Hippo, MD;  [facility];  Service: Endoscopy;;   REMOVAL OF STONES  08/23/2022   Procedure: REMOVAL OF STONES;  Surgeon: Lemar Lofty., MD;  Location: [facility];  Service:  Gastroenterology;;   Right snkle repair     SPHINCTEROTOMY  08/23/2022   Procedure: Dennison Mascot;  Surgeon: Lemar Lofty., MD;  Location: [facility];  Service: Gastroenterology;;   Patient Active Problem List   Diagnosis Date Noted   Lumbar compression fracture (HCC) 07/05/2022   Leukocytosis 07/05/2022   Stage 3a chronic kidney disease (CKD) (HCC) 07/05/2022   Lumbar nerve root impingement 07/08/2018   DDD (degenerative disc disease), lumbar 07/08/2018   Vitamin D deficiency 03/03/2018   HNP (herniated nucleus pulposus), lumbar 05/01/2017   Overweight (BMI 25.0-29.9) 12/21/2014   GERD (gastroesophageal reflux disease)    Hyperlipidemia with target LDL less than 100    Hypertension    Osteoporosis     PCP: Junie Spencer, FNP  REFERRING PROVIDER: Donalee Citrin, MD   REFERRING DIAG: Wedge compression fracture of fourth lumbar vertebra, initial encounter for closed fracture   Rationale for Evaluation and Treatment: Rehabilitation  THERAPY DIAG:  Radiculopathy, lumbar region  Muscle weakness (generalized)  Muscle spasm of back  ONSET DATE: "a while now"   SUBJECTIVE:  SUBJECTIVE STATEMENT: Pt reports 3/10 right low back pain today. Ready to discharge at next session.  PERTINENT HISTORY:  Hypertension, osteoporosis, arthritis, and chronic kidney disease  PAIN:  Are you having pain? Yes: NPRS scale: 3/10 Pain location: low back Pain description: intermittent aching and throbbing Aggravating factors: walking, standing Relieving factors: sitting  PRECAUTIONS: None  RED FLAGS: None   WEIGHT BEARING RESTRICTIONS: No  FALLS:  Has patient fallen in last 6 months? No  LIVING ENVIRONMENT: Lives with: lives alone Lives in: House/apartment Stairs: Yes: Internal: "I avoid these  steps" steps; on right going up Has following equipment at home: Quad cane large base  OCCUPATION: retired  PLOF: Independent with basic ADLs  PATIENT GOALS: be able to stand and walk longer (she was able to walk at least 30 minutes before this pain began, but unable to do this currently) to be able to do her housework  NEXT MD VISIT: 01/03/23  OBJECTIVE:   PATIENT SURVEYS:  FOTO 51.87  SCREENING FOR RED FLAGS: Bowel or bladder incontinence: No Spinal tumors: No Cauda equina syndrome: No Compression fracture: No Abdominal aneurysm: No  COGNITION: Overall cognitive status: Within functional limits for tasks assessed     SENSATION: Patient reports no numbness or tingling  POSTURE: rounded shoulders, forward head, and flexed trunk (stands with 6 degrees of lumbar flexion)   PALPATION: TTP: bilateral lumbar paraspinals and QL  LUMBAR JOINT MOBILITY:  Hypomobile and painful throughout   LUMBAR ROM:   AROM eval  Flexion 40; prior to knee flexion   Extension 12; low back pain   Right lateral flexion 75% limited; prior to lumbar flexion and rotation  Left lateral flexion 75% limited; prior to lumbar flexion and rotation with familiar low back and left hip pain  Right rotation 50% limited  Left rotation 50% limited; familiar low back left hip pain   (Blank rows = not tested)  LOWER EXTREMITY ROM:  WFL for activities assessed  LOWER EXTREMITY MMT:    MMT Right eval Left eval  Hip flexion 3/5 3/5  Hip extension    Hip abduction    Hip adduction    Hip internal rotation    Hip external rotation    Knee flexion 4/5 4/5  Knee extension 3+/5 4-/5  Ankle dorsiflexion 3+/5 3+/5; familiar pain below her knee  Ankle plantarflexion    Ankle inversion    Ankle eversion     (Blank rows = not tested)  GAIT: Assistive device utilized: Quad cane large base Level of assistance: Modified independence Comments: decreased gait speed and stride length  Gait speed: 0.78  m/s  TODAY'S TREATMENT:                                                                                                                              DATE:02/06/23:  EXERCISE LOG  Exercise Repetitions and Resistance Comments  Nustep Level 3 x 20 minutes        Manual Therapy Soft Tissue Mobilization: right lumbar, STW/M to right lumbar paraspinals and QL to decrease pain and tone with pt seated for comfort    Modalities  Date:  Unattended Estim: Lumbar, IFC 80-150 Hz, 15 mins, Pain Hot Pack: Lumbar, 15 mins, Pain and Tone                                    01/31/23 EXERCISE LOG  Exercise Repetitions and Resistance Comments  Nustep  L4 x 17 minutes   Rocker board  4 minutes   Isometric ball press  30 reps  Required maximal verbal and tactile cueing to limit trunk mobility   LAQ  3# x 3 minutes  Alternating LE every 10 reps   Seated hip ADD isometric  3 minutes w/ 5 second hold    Seated marching  3# x 2 minutes  Alternating LE   Seated HS curl  Red t-band x 1.5 minutes each     Blank cell = exercise not performed today                                     01-29-23 EXERCISE LOG  Exercise Repetitions and Resistance Comments  Nustep L4 x 16 mins      Rockerboard 4 mins   Standing Marches Airex 3 mins   LAQs 3# x30 reps bil   Seated Marches 3# x30 reps bil   Seated Hip Abduction Red 3 mins   Seated Hip Adduction Ball squeeze 3.5 mins   Seated Ham Curls Red x25 reps bil   STS X10 reps BUE support    Modalities  Date:  Unattended Estim: Lumbar, IFC 80-150 Hz, 15 mins, Pain Hot Pack: Lumbar, 15 mins, Pain and Tone  PATIENT EDUCATION:  Education details: POC, prognosis, healing, and goals for therapy  Person educated: Patient Education method: Explanation Education comprehension: verbalized understanding  HOME EXERCISE PROGRAM:   ASSESSMENT:  CLINICAL IMPRESSION: Pt arrives for today's treatment session reporting 3/10 right side low  back pain.  Pt reports that since her injection her left low back is feeling better and her right lower back has begun to tighten up.  Pt reports that she has a MD appointment tomorrow and plans to ask about her right low back.  STW/M performed to right lumbar paraspinals and QL to decrease pain and tone. Normal responses to estim and MH noted upon removal.  Pt denied any pain at completion of today's treatment session.   OBJECTIVE IMPAIRMENTS: Abnormal gait, decreased activity tolerance, decreased mobility, difficulty walking, decreased ROM, decreased strength, hypomobility, impaired tone, postural dysfunction, and pain.   ACTIVITY LIMITATIONS: carrying, lifting, bending, standing, stairs, and locomotion level  PARTICIPATION LIMITATIONS: meal prep, cleaning, laundry, shopping, and community activity  PERSONAL FACTORS: Age, Time since onset of injury/illness/exacerbation, and 3+ comorbidities: Hypertension, osteoporosis, arthritis, and chronic kidney disease  are also affecting patient's functional outcome.   REHAB POTENTIAL: Good  CLINICAL DECISION MAKING: Evolving/moderate complexity  EVALUATION COMPLEXITY: Moderate   GOALS: Goals reviewed with patient? Yes  SHORT TERM GOALS: Target date: 01/23/23  Patient will be independent with her initial HEP.  Baseline: Goal status: MET  2.  Patient will be able to complete her  daily activities without her familiar pain exceeding 5/10. Baseline:  Goal status: MET  3.  Patient will be able to stand and walk for at least 15 minutes without being limited by her familiar symptoms.  Baseline:  Goal status: On going  LONG TERM GOALS: Target date: 02/14/23  Patient will be independent with her advanced HEP.  Baseline:  Goal status: IN PROGRESS  2.  Patient will improve her gait speed to at least 1.0 m/s for improved community mobility.  Baseline:  Goal status: IN PROGRESS  3.  Patient will be able to demonstrate proper lifting mechanics for  improved function with household activities.  Baseline:  Goal status: IN PROGRESS  4.  Patient will be able to walk for at least 30 minutes without being limited by her familiar symptoms.  Baseline:  Goal status: IN PROGRESS  PLAN:  PT FREQUENCY: 2x/week  PT DURATION: 6 weeks  PLANNED INTERVENTIONS: Therapeutic exercises, Therapeutic activity, Neuromuscular re-education, Balance training, Gait training, Patient/Family education, Self Care, Joint mobilization, Stair training, Electrical stimulation, Spinal mobilization, Cryotherapy, Moist heat, Manual therapy, and Re-evaluation.  PLAN FOR NEXT SESSION: discharge   Newman Pies, PTA 02/06/2023, 10:24 AM

## 2023-02-07 DIAGNOSIS — M5440 Lumbago with sciatica, unspecified side: Secondary | ICD-10-CM | POA: Diagnosis not present

## 2023-02-07 DIAGNOSIS — Z6826 Body mass index (BMI) 26.0-26.9, adult: Secondary | ICD-10-CM | POA: Diagnosis not present

## 2023-02-11 ENCOUNTER — Ambulatory Visit: Payer: Medicare Other

## 2023-02-11 DIAGNOSIS — M6281 Muscle weakness (generalized): Secondary | ICD-10-CM

## 2023-02-11 DIAGNOSIS — M62830 Muscle spasm of back: Secondary | ICD-10-CM | POA: Diagnosis not present

## 2023-02-11 DIAGNOSIS — M5416 Radiculopathy, lumbar region: Secondary | ICD-10-CM | POA: Diagnosis not present

## 2023-02-11 NOTE — Therapy (Signed)
OUTPATIENT PHYSICAL THERAPY THORACOLUMBAR TREATMENT   Patient Name: Cheryl Lee MRN: [id number] DOB:Feb 20, 1946, 77 y.o., female Today's Date: 02/11/2023  END OF SESSION:  PT End of Session - 02/11/23 0936     Visit Number 12    Number of Visits 12    Date for PT Re-Evaluation 03/08/23    PT Start Time 0930    PT Stop Time 1045    PT Time Calculation (min) 75 min    Activity Tolerance Patient tolerated treatment well    Behavior During Therapy [facility] for tasks assessed/performed              Past Medical History:  Diagnosis Date   Acid reflux    Arthritis    Dyspnea    HNP (herniated nucleus pulposus), lumbar    Hypercholesteremia    Hypertension    Osteoporosis    Sinus congestion    Past Surgical History:  Procedure Laterality Date   BIOPSY  08/23/2022   Procedure: BIOPSY;  Surgeon: Lemar Lofty., MD;  Location: [facility];  Service: Gastroenterology;;   CATARACT EXTRACTION     CHOLECYSTECTOMY N/A 08/23/2022   Procedure: LAPAROSCOPIC CHOLECYSTECTOMY;  Surgeon: Fritzi Mandes, MD;  Location: [facility];  Service: General;  Laterality: N/A;   COLONOSCOPY     COLONOSCOPY N/A 01/21/2020   Procedure: COLONOSCOPY;  Surgeon: Malissa Hippo, MD;  [facility];  Service: Endoscopy;  Laterality: N/A;  730   ERCP N/A 08/23/2022   Procedure: ENDOSCOPIC RETROGRADE CHOLANGIOPANCREATOGRAPHY (ERCP);  Surgeon: Lemar Lofty., MD;  Location: [facility];  Service: Gastroenterology;  Laterality: N/A;   LUMBAR LAMINECTOMY/DECOMPRESSION MICRODISCECTOMY Left 05/01/2017   Procedure: Microdiscectomy - Lumbar four-five  - left;  Surgeon: Donalee Citrin, MD;  Location: [facility];  Service: Neurosurgery;  Laterality: Left;   POLYPECTOMY  01/21/2020   Procedure: POLYPECTOMY;  Surgeon: Malissa Hippo, MD;  [facility];  Service: Endoscopy;;   REMOVAL OF STONES  08/23/2022   Procedure: REMOVAL OF STONES;  Surgeon: Lemar Lofty., MD;  Location: [facility];  Service:  Gastroenterology;;   Right snkle repair     SPHINCTEROTOMY  08/23/2022   Procedure: Dennison Mascot;  Surgeon: Lemar Lofty., MD;  Location: [facility];  Service: Gastroenterology;;   Patient Active Problem List   Diagnosis Date Noted   Lumbar compression fracture (HCC) 07/05/2022   Leukocytosis 07/05/2022   Stage 3a chronic kidney disease (CKD) (HCC) 07/05/2022   Lumbar nerve root impingement 07/08/2018   DDD (degenerative disc disease), lumbar 07/08/2018   Vitamin D deficiency 03/03/2018   HNP (herniated nucleus pulposus), lumbar 05/01/2017   Overweight (BMI 25.0-29.9) 12/21/2014   GERD (gastroesophageal reflux disease)    Hyperlipidemia with target LDL less than 100    Hypertension    Osteoporosis     PCP: Junie Spencer, FNP  REFERRING PROVIDER: Donalee Citrin, MD   REFERRING DIAG: Wedge compression fracture of fourth lumbar vertebra, initial encounter for closed fracture   Rationale for Evaluation and Treatment: Rehabilitation  THERAPY DIAG:  Radiculopathy, lumbar region  Muscle weakness (generalized)  Muscle spasm of back  ONSET DATE: "a while now"   SUBJECTIVE:  SUBJECTIVE STATEMENT: Pt reports 3/10 right low back pain today.   PERTINENT HISTORY:  Hypertension, osteoporosis, arthritis, and chronic kidney disease  PAIN:  Are you having pain? Yes: NPRS scale: 3/10 Pain location: low back Pain description: intermittent aching and throbbing Aggravating factors: walking, standing Relieving factors: sitting  PRECAUTIONS: None  RED FLAGS: None   WEIGHT BEARING RESTRICTIONS: No  FALLS:  Has patient fallen in last 6 months? No  LIVING ENVIRONMENT: Lives with: lives alone Lives in: House/apartment Stairs: Yes: Internal: "I avoid these steps" steps; on right going up Has  following equipment at home: Quad cane large base  OCCUPATION: retired  PLOF: Independent with basic ADLs  PATIENT GOALS: be able to stand and walk longer (she was able to walk at least 30 minutes before this pain began, but unable to do this currently) to be able to do her housework  NEXT MD VISIT: 01/03/23  OBJECTIVE:   PATIENT SURVEYS:  FOTO 51.87  SCREENING FOR RED FLAGS: Bowel or bladder incontinence: No Spinal tumors: No Cauda equina syndrome: No Compression fracture: No Abdominal aneurysm: No  COGNITION: Overall cognitive status: Within functional limits for tasks assessed     SENSATION: Patient reports no numbness or tingling  POSTURE: rounded shoulders, forward head, and flexed trunk (stands with 6 degrees of lumbar flexion)   PALPATION: TTP: bilateral lumbar paraspinals and QL  LUMBAR JOINT MOBILITY:  Hypomobile and painful throughout   LUMBAR ROM:   AROM eval  Flexion 40; prior to knee flexion   Extension 12; low back pain   Right lateral flexion 75% limited; prior to lumbar flexion and rotation  Left lateral flexion 75% limited; prior to lumbar flexion and rotation with familiar low back and left hip pain  Right rotation 50% limited  Left rotation 50% limited; familiar low back left hip pain   (Blank rows = not tested)  LOWER EXTREMITY ROM:  WFL for activities assessed  LOWER EXTREMITY MMT:    MMT Right eval Left eval  Hip flexion 3/5 3/5  Hip extension    Hip abduction    Hip adduction    Hip internal rotation    Hip external rotation    Knee flexion 4/5 4/5  Knee extension 3+/5 4-/5  Ankle dorsiflexion 3+/5 3+/5; familiar pain below her knee  Ankle plantarflexion    Ankle inversion    Ankle eversion     (Blank rows = not tested)  GAIT: Assistive device utilized: Quad cane large base Level of assistance: Modified independence Comments: decreased gait speed and stride length  Gait speed: 0.78 m/s  TODAY'S TREATMENT:                                                                                                                               DATE:02/11/23:  EXERCISE LOG  Exercise Repetitions and Resistance Comments  Nustep Level 3 x 20 minutes   LAQs 3# x 3 mins   Seated marches 3# x 3 mins   Seated Hip Adduction 3 mins   Seated Hip Abduction Red x 3 mins   Seated Ham Curls Red x 25 reps bil   STS x10 reps    Modalities  Date:  Unattended Estim: Lumbar, IFC 80-150 Hz, 15 mins, Pain Hot Pack: Lumbar, 15 mins, Pain and Tone                                    01/31/23 EXERCISE LOG  Exercise Repetitions and Resistance Comments  Nustep  L4 x 17 minutes   Rocker board  4 minutes   Isometric ball press  30 reps  Required maximal verbal and tactile cueing to limit trunk mobility   LAQ  3# x 3 minutes  Alternating LE every 10 reps   Seated hip ADD isometric  3 minutes w/ 5 second hold    Seated marching  3# x 2 minutes  Alternating LE   Seated HS curl  Red t-band x 1.5 minutes each     Blank cell = exercise not performed today                                     PATIENT EDUCATION:  Education details: POC, prognosis, healing, and goals for therapy  Person educated: Patient Education method: Explanation Education comprehension: verbalized understanding  HOME EXERCISE PROGRAM:   ASSESSMENT:  CLINICAL IMPRESSION: Pt arrives for today's treatment session reporting 3/10 low back pain. Pt reports that she is able to tolerate standing or walking 20 minutes before requiring a seated rest break.  Pt requiring heavy verbal and tactile cues for proper body mechanics with lifting objects.  Pt reports that she will be getting another lumbar injection in the near future, but is not sure of the date.  Pt will go on hold at this time and will contact the facility in the next few weeks with an update on how her MD would like to progress.     OBJECTIVE IMPAIRMENTS: Abnormal gait, decreased  activity tolerance, decreased mobility, difficulty walking, decreased ROM, decreased strength, hypomobility, impaired tone, postural dysfunction, and pain.   ACTIVITY LIMITATIONS: carrying, lifting, bending, standing, stairs, and locomotion level  PARTICIPATION LIMITATIONS: meal prep, cleaning, laundry, shopping, and community activity  PERSONAL FACTORS: Age, Time since onset of injury/illness/exacerbation, and 3+ comorbidities: Hypertension, osteoporosis, arthritis, and chronic kidney disease  are also affecting patient's functional outcome.   REHAB POTENTIAL: Good  CLINICAL DECISION MAKING: Evolving/moderate complexity  EVALUATION COMPLEXITY: Moderate   GOALS: Goals reviewed with patient? Yes  SHORT TERM GOALS: Target date: 01/23/23  Patient will be independent with her initial HEP.  Baseline: Goal status: MET  2.  Patient will be able to complete her daily activities without her familiar pain exceeding 5/10. Baseline:  Goal status: MET  3.  Patient will be able to stand and walk for at least 15 minutes without being limited by her familiar symptoms.  Baseline:  Goal status: MET  LONG TERM GOALS: Target date: 02/14/23  Patient will be independent with her advanced HEP.  Baseline:  Goal status: MET  2.  Patient will improve her gait speed to at least 1.0 m/s for  improved community mobility.  Baseline:  Goal status: IN PROGRESS  3.  Patient will be able to demonstrate proper lifting mechanics for improved function with household activities.  Baseline: 9/30: heavy cues required for proper mechanics Goal status: IN PROGRESS  4.  Patient will be able to walk for at least 30 minutes without being limited by her familiar symptoms.  Baseline: 9/30: approximately 20 mins Goal status: IN PROGRESS  PLAN:  PT FREQUENCY: 2x/week  PT DURATION: 6 weeks  PLANNED INTERVENTIONS: Therapeutic exercises, Therapeutic activity, Neuromuscular re-education, Balance training, Gait  training, Patient/Family education, Self Care, Joint mobilization, Stair training, Electrical stimulation, Spinal mobilization, Cryotherapy, Moist heat, Manual therapy, and Re-evaluation.  PLAN FOR NEXT SESSION: discharge   Newman Pies, PTA 02/11/2023, 12:12 PM

## 2023-02-19 ENCOUNTER — Ambulatory Visit: Payer: Medicare Other | Attending: Neurosurgery

## 2023-02-19 DIAGNOSIS — M6281 Muscle weakness (generalized): Secondary | ICD-10-CM | POA: Diagnosis not present

## 2023-02-19 DIAGNOSIS — M5416 Radiculopathy, lumbar region: Secondary | ICD-10-CM | POA: Diagnosis not present

## 2023-02-19 DIAGNOSIS — M62830 Muscle spasm of back: Secondary | ICD-10-CM | POA: Diagnosis not present

## 2023-02-19 NOTE — Addendum Note (Signed)
Addended by: Granville Lewis on: 02/19/2023 05:03 PM   Modules accepted: Orders

## 2023-02-19 NOTE — Therapy (Addendum)
OUTPATIENT PHYSICAL THERAPY THORACOLUMBAR TREATMENT   Patient Name: Cheryl Lee MRN: [id number] DOB:1945-12-28, 77 y.o., female Today's Date: 02/19/2023  END OF SESSION:  PT End of Session - 02/19/23 1104     Visit Number 13    Number of Visits 18    Date for PT Re-Evaluation 03/15/23    PT Start Time 1100    PT Stop Time 1159    PT Time Calculation (min) 59 min    Activity Tolerance Patient tolerated treatment well    Behavior During Therapy WFL for tasks assessed/performed              Past Medical History:  Diagnosis Date   Acid reflux    Arthritis    Dyspnea    HNP (herniated nucleus pulposus), lumbar    Hypercholesteremia    Hypertension    Osteoporosis    Sinus congestion    Past Surgical History:  Procedure Laterality Date   BIOPSY  08/23/2022   Procedure: BIOPSY;  Surgeon: Lemar Lofty., MD;  Location: [facility];  Service: Gastroenterology;;   CATARACT EXTRACTION     CHOLECYSTECTOMY N/A 08/23/2022   Procedure: LAPAROSCOPIC CHOLECYSTECTOMY;  Surgeon: Fritzi Mandes, MD;  Location: [facility];  Service: General;  Laterality: N/A;   COLONOSCOPY     COLONOSCOPY N/A 01/21/2020   Procedure: COLONOSCOPY;  Surgeon: Malissa Hippo, MD;  Location: [facility];  Service: Endoscopy;  Laterality: N/A;  730   ERCP N/A 08/23/2022   Procedure: ENDOSCOPIC RETROGRADE CHOLANGIOPANCREATOGRAPHY (ERCP);  Surgeon: Lemar Lofty., MD;  Location: [facility];  Service: Gastroenterology;  Laterality: N/A;   LUMBAR LAMINECTOMY/DECOMPRESSION MICRODISCECTOMY Left 05/01/2017   Procedure: Microdiscectomy - Lumbar four-five  - left;  Surgeon: Donalee Citrin, MD;  Location: [facility] OR;  Service: Neurosurgery;  Laterality: Left;   POLYPECTOMY  01/21/2020   Procedure: POLYPECTOMY;  Surgeon: Malissa Hippo, MD;  Location: [facility];  Service: Endoscopy;;   REMOVAL OF STONES  08/23/2022   Procedure: REMOVAL OF STONES;  Surgeon: Lemar Lofty., MD;  Location: [facility];  Service:  Gastroenterology;;   Right snkle repair     SPHINCTEROTOMY  08/23/2022   Procedure: Dennison Mascot;  Surgeon: Lemar Lofty., MD;  Location: [facility];  Service: Gastroenterology;;   Patient Active Problem List   Diagnosis Date Noted   Lumbar compression fracture (HCC) 07/05/2022   Leukocytosis 07/05/2022   Stage 3a chronic kidney disease (CKD) (HCC) 07/05/2022   Lumbar nerve root impingement 07/08/2018   DDD (degenerative disc disease), lumbar 07/08/2018   Vitamin D deficiency 03/03/2018   HNP (herniated nucleus pulposus), lumbar 05/01/2017   Overweight (BMI 25.0-29.9) 12/21/2014   GERD (gastroesophageal reflux disease)    Hyperlipidemia with target LDL less than 100    Hypertension    Osteoporosis     PCP: Junie Spencer, FNP  REFERRING PROVIDER: Donalee Citrin, MD   REFERRING DIAG: Wedge compression fracture of fourth lumbar vertebra, initial encounter for closed fracture   Rationale for Evaluation and Treatment: Rehabilitation  THERAPY DIAG:  Radiculopathy, lumbar region  Muscle weakness (generalized)  Muscle spasm of back  ONSET DATE: "a while now"   SUBJECTIVE:  SUBJECTIVE STATEMENT: Pt reports 4-5/10 right low back pain today.   PERTINENT HISTORY:  Hypertension, osteoporosis, arthritis, and chronic kidney disease  PAIN:  Are you having pain? Yes: NPRS scale: 4-5/10 Pain location: low back Pain description: intermittent aching and throbbing Aggravating factors: walking, standing Relieving factors: sitting  PRECAUTIONS: None  RED FLAGS: None   WEIGHT BEARING RESTRICTIONS: No  FALLS:  Has patient fallen in last 6 months? No  LIVING ENVIRONMENT: Lives with: lives alone Lives in: House/apartment Stairs: Yes: Internal: "I avoid these steps" steps; on right going  up Has following equipment at home: Quad cane large base  OCCUPATION: retired  PLOF: Independent with basic ADLs  PATIENT GOALS: be able to stand and walk longer (she was able to walk at least 30 minutes before this pain began, but unable to do this currently) to be able to do her housework  NEXT MD VISIT: 01/03/23  OBJECTIVE:   PATIENT SURVEYS:  FOTO 51.87  SCREENING FOR RED FLAGS: Bowel or bladder incontinence: No Spinal tumors: No Cauda equina syndrome: No Compression fracture: No Abdominal aneurysm: No  COGNITION: Overall cognitive status: Within functional limits for tasks assessed     SENSATION: Patient reports no numbness or tingling  POSTURE: rounded shoulders, forward head, and flexed trunk (stands with 6 degrees of lumbar flexion)   PALPATION: TTP: bilateral lumbar paraspinals and QL  LUMBAR JOINT MOBILITY:  Hypomobile and painful throughout   LUMBAR ROM:   AROM eval  Flexion 40; prior to knee flexion   Extension 12; low back pain   Right lateral flexion 75% limited; prior to lumbar flexion and rotation  Left lateral flexion 75% limited; prior to lumbar flexion and rotation with familiar low back and left hip pain  Right rotation 50% limited  Left rotation 50% limited; familiar low back left hip pain   (Blank rows = not tested)  LOWER EXTREMITY ROM:  WFL for activities assessed  LOWER EXTREMITY MMT:    MMT Right eval Left eval  Hip flexion 3/5 3/5  Hip extension    Hip abduction    Hip adduction    Hip internal rotation    Hip external rotation    Knee flexion 4/5 4/5  Knee extension 3+/5 4-/5  Ankle dorsiflexion 3+/5 3+/5; familiar pain below her knee  Ankle plantarflexion    Ankle inversion    Ankle eversion     (Blank rows = not tested)  GAIT: Assistive device utilized: Quad cane large base Level of assistance: Modified independence Comments: decreased gait speed and stride length  Gait speed: 0.78 m/s  TODAY'S TREATMENT:                                                                                                                               DATE:02/14/23:  EXERCISE LOG  Exercise Repetitions and Resistance Comments  Nustep Level 3 x 20 minutes   LAQs 3# x 3.5 mins   Seated marches 3# x 3.5 mins   Seated Hip Adduction 3.5 mins   Seated Hip Abduction Green x 2 mins   Seated Ham Curls Red x 30 reps bil   STS x10 reps    Modalities  Date:  Unattended Estim: Lumbar, IFC 80-150 Hz, 15 mins, Pain Hot Pack: Lumbar, 15 mins, Pain and Tone                                    01/31/23 EXERCISE LOG  Exercise Repetitions and Resistance Comments  Nustep  L4 x 17 minutes   Rocker board  4 minutes   Isometric ball press  30 reps  Required maximal verbal and tactile cueing to limit trunk mobility   LAQ  3# x 3 minutes  Alternating LE every 10 reps   Seated hip ADD isometric  3 minutes w/ 5 second hold    Seated marching  3# x 2 minutes  Alternating LE   Seated HS curl  Red t-band x 1.5 minutes each     Blank cell = exercise not performed today                                     PATIENT EDUCATION:  Education details: POC, prognosis, healing, and goals for therapy  Person educated: Patient Education method: Explanation Education comprehension: verbalized understanding  HOME EXERCISE PROGRAM:   ASSESSMENT:  CLINICAL IMPRESSION: Pt arrives for today's treatment session reporting 4-5/10 right low back pain.   Pt reports that she has an appointment scheduled for Monday to get her second injection.  Pt able to tolerate increased time and/or resistance with all exercises performed today.  Normal responses to estim and MH noted upon removal. Pt is making progress towards her goals at this time and would benefit from continuation of current POC to address further deficits and limitations.  Pt reported 3/10 right low back pain at completion of today's treatment session.   OBJECTIVE  IMPAIRMENTS: Abnormal gait, decreased activity tolerance, decreased mobility, difficulty walking, decreased ROM, decreased strength, hypomobility, impaired tone, postural dysfunction, and pain.   ACTIVITY LIMITATIONS: carrying, lifting, bending, standing, stairs, and locomotion level  PARTICIPATION LIMITATIONS: meal prep, cleaning, laundry, shopping, and community activity  PERSONAL FACTORS: Age, Time since onset of injury/illness/exacerbation, and 3+ comorbidities: Hypertension, osteoporosis, arthritis, and chronic kidney disease  are also affecting patient's functional outcome.   REHAB POTENTIAL: Good  CLINICAL DECISION MAKING: Evolving/moderate complexity  EVALUATION COMPLEXITY: Moderate   GOALS: Goals reviewed with patient? Yes  SHORT TERM GOALS: Target date: 01/23/23  Patient will be independent with her initial HEP.  Baseline: Goal status: MET  2.  Patient will be able to complete her daily activities without her familiar pain exceeding 5/10. Baseline:  Goal status: MET  3.  Patient will be able to stand and walk for at least 15 minutes without being limited by her familiar symptoms.  Baseline:  Goal status: MET  LONG TERM GOALS: Target date: 02/14/23  Patient will be independent with her advanced HEP.  Baseline:  Goal status: MET  2.  Patient will improve her gait speed to at least 1.0 m/s for improved community mobility.  Baseline:  Goal status:  IN PROGRESS  3.  Patient will be able to demonstrate proper lifting mechanics for improved function with household activities.  Baseline: 9/30: heavy cues required for proper mechanics Goal status: IN PROGRESS  4.  Patient will be able to walk for at least 30 minutes without being limited by her familiar symptoms.  Baseline: 9/30: approximately 20 mins Goal status: IN PROGRESS  PLAN:  PT FREQUENCY: 2x/week  PT DURATION: 6 weeks  PLANNED INTERVENTIONS: Therapeutic exercises, Therapeutic activity, Neuromuscular  re-education, Balance training, Gait training, Patient/Family education, Self Care, Joint mobilization, Stair training, Electrical stimulation, Spinal mobilization, Cryotherapy, Moist heat, Manual therapy, and Re-evaluation.  PLAN FOR NEXT SESSION: discharge   Newman Pies, PTA 02/19/2023, 12:00 PM

## 2023-02-20 DIAGNOSIS — Z049 Encounter for examination and observation for unspecified reason: Secondary | ICD-10-CM | POA: Diagnosis not present

## 2023-02-20 DIAGNOSIS — S32059A Unspecified fracture of fifth lumbar vertebra, initial encounter for closed fracture: Secondary | ICD-10-CM | POA: Diagnosis not present

## 2023-02-20 DIAGNOSIS — S32049A Unspecified fracture of fourth lumbar vertebra, initial encounter for closed fracture: Secondary | ICD-10-CM | POA: Diagnosis not present

## 2023-02-20 DIAGNOSIS — M549 Dorsalgia, unspecified: Secondary | ICD-10-CM | POA: Diagnosis not present

## 2023-02-20 DIAGNOSIS — E785 Hyperlipidemia, unspecified: Secondary | ICD-10-CM | POA: Diagnosis not present

## 2023-02-20 DIAGNOSIS — I10 Essential (primary) hypertension: Secondary | ICD-10-CM | POA: Diagnosis not present

## 2023-02-20 DIAGNOSIS — K219 Gastro-esophageal reflux disease without esophagitis: Secondary | ICD-10-CM | POA: Diagnosis not present

## 2023-02-20 DIAGNOSIS — S161XXA Strain of muscle, fascia and tendon at neck level, initial encounter: Secondary | ICD-10-CM | POA: Diagnosis not present

## 2023-02-20 DIAGNOSIS — Y939 Activity, unspecified: Secondary | ICD-10-CM | POA: Diagnosis not present

## 2023-02-20 DIAGNOSIS — Y92410 Unspecified street and highway as the place of occurrence of the external cause: Secondary | ICD-10-CM | POA: Diagnosis not present

## 2023-02-20 DIAGNOSIS — S32029A Unspecified fracture of second lumbar vertebra, initial encounter for closed fracture: Secondary | ICD-10-CM | POA: Diagnosis not present

## 2023-02-25 DIAGNOSIS — M5416 Radiculopathy, lumbar region: Secondary | ICD-10-CM | POA: Diagnosis not present

## 2023-03-01 ENCOUNTER — Encounter: Payer: Self-pay | Admitting: *Deleted

## 2023-03-01 ENCOUNTER — Ambulatory Visit: Payer: Medicare Other | Admitting: *Deleted

## 2023-03-01 DIAGNOSIS — M6281 Muscle weakness (generalized): Secondary | ICD-10-CM

## 2023-03-01 DIAGNOSIS — M5416 Radiculopathy, lumbar region: Secondary | ICD-10-CM

## 2023-03-01 DIAGNOSIS — M62830 Muscle spasm of back: Secondary | ICD-10-CM

## 2023-03-01 NOTE — Therapy (Signed)
OUTPATIENT PHYSICAL THERAPY THORACOLUMBAR TREATMENT   Patient Name: Cheryl Lee MRN: [id number] DOB:[birth year], 77 y.o., female Today's Date: 03/01/2023  END OF SESSION:  PT End of Session - 03/01/23 0931     Visit Number 14    Number of Visits 18    Date for PT Re-Evaluation 03/15/23    PT Start Time 0931    PT Stop Time 1020    PT Time Calculation (min) 49 min              Past Medical History:  Diagnosis Date   Acid reflux    Arthritis    Dyspnea    HNP (herniated nucleus pulposus), lumbar    Hypercholesteremia    Hypertension    Osteoporosis    Sinus congestion    Past Surgical History:  Procedure Laterality Date   BIOPSY  08/23/2022   Procedure: BIOPSY;  Surgeon: Lemar Lofty., MD;  Location: [facility];  Service: Gastroenterology;;   CATARACT EXTRACTION     CHOLECYSTECTOMY N/A 08/23/2022   Procedure: LAPAROSCOPIC CHOLECYSTECTOMY;  Surgeon: Fritzi Mandes, MD;  Location: [facility];  Service: General;  Laterality: N/A;   COLONOSCOPY     COLONOSCOPY N/A 01/21/2020   Procedure: COLONOSCOPY;  Surgeon: Malissa Hippo, MD;  Location: [facility];  Service: Endoscopy;  Laterality: N/A;  730   ERCP N/A 08/23/2022   Procedure: ENDOSCOPIC RETROGRADE CHOLANGIOPANCREATOGRAPHY (ERCP);  Surgeon: Lemar Lofty., MD;  Location: [facility];  Service: Gastroenterology;  Laterality: N/A;   LUMBAR LAMINECTOMY/DECOMPRESSION MICRODISCECTOMY Left 05/01/2017   Procedure: Microdiscectomy - Lumbar four-five  - left;  Surgeon: Donalee Citrin, MD;  Location: [facility];  Service: Neurosurgery;  Laterality: Left;   POLYPECTOMY  01/21/2020   Procedure: POLYPECTOMY;  Surgeon: Malissa Hippo, MD;  Location: [facility];  Service: Endoscopy;;   REMOVAL OF STONES  08/23/2022   Procedure: REMOVAL OF STONES;  Surgeon: Lemar Lofty., MD;  Location: [facility];  Service: Gastroenterology;;   Right snkle repair     SPHINCTEROTOMY  08/23/2022   Procedure: Dennison Mascot;  Surgeon:  Lemar Lofty., MD;  Location: [facility];  Service: Gastroenterology;;   Patient Active Problem List   Diagnosis Date Noted   Lumbar compression fracture (HCC) 07/05/2022   Leukocytosis 07/05/2022   Stage 3a chronic kidney disease (CKD) (HCC) 07/05/2022   Lumbar nerve root impingement 07/08/2018   DDD (degenerative disc disease), lumbar 07/08/2018   Vitamin D deficiency 03/03/2018   HNP (herniated nucleus pulposus), lumbar 05/01/2017   Overweight (BMI 25.0-29.9) 12/21/2014   GERD (gastroesophageal reflux disease)    Hyperlipidemia with target LDL less than 100    Hypertension    Osteoporosis     PCP: Junie Spencer, FNP  REFERRING PROVIDER: Donalee Citrin, MD   REFERRING DIAG: Wedge compression fracture of fourth lumbar vertebra, initial encounter for closed fracture   Rationale for Evaluation and Treatment: Rehabilitation  THERAPY DIAG:  Radiculopathy, lumbar region  Muscle weakness (generalized)  Muscle spasm of back  ONSET DATE: "a while now"   SUBJECTIVE:  SUBJECTIVE STATEMENT: Pt reports 4-5/10 right low back pain today.   PERTINENT HISTORY:  Hypertension, osteoporosis, arthritis, and chronic kidney disease  PAIN:  Are you having pain? Yes: NPRS scale: 4-5/10 Pain location: low back Pain description: intermittent aching and throbbing Aggravating factors: walking, standing Relieving factors: sitting  PRECAUTIONS: None  RED FLAGS: None   WEIGHT BEARING RESTRICTIONS: No  FALLS:  Has patient fallen in last 6 months? No  LIVING ENVIRONMENT: Lives with: lives alone Lives in: House/apartment Stairs: Yes: Internal: "I avoid these steps" steps; on right going up Has following equipment at home: Quad cane large base  OCCUPATION: retired  PLOF: Independent with basic  ADLs  PATIENT GOALS: be able to stand and walk longer (she was able to walk at least 30 minutes before this pain began, but unable to do this currently) to be able to do her housework  NEXT MD VISIT: 01/03/23  OBJECTIVE:   PATIENT SURVEYS:  FOTO 51.87  SCREENING FOR RED FLAGS: Bowel or bladder incontinence: No Spinal tumors: No Cauda equina syndrome: No Compression fracture: No Abdominal aneurysm: No  COGNITION: Overall cognitive status: Within functional limits for tasks assessed     SENSATION: Patient reports no numbness or tingling  POSTURE: rounded shoulders, forward head, and flexed trunk (stands with 6 degrees of lumbar flexion)   PALPATION: TTP: bilateral lumbar paraspinals and QL  LUMBAR JOINT MOBILITY:  Hypomobile and painful throughout   LUMBAR ROM:   AROM eval  Flexion 40; prior to knee flexion   Extension 12; low back pain   Right lateral flexion 75% limited; prior to lumbar flexion and rotation  Left lateral flexion 75% limited; prior to lumbar flexion and rotation with familiar low back and left hip pain  Right rotation 50% limited  Left rotation 50% limited; familiar low back left hip pain   (Blank rows = not tested)  LOWER EXTREMITY ROM:  WFL for activities assessed  LOWER EXTREMITY MMT:    MMT Right eval Left eval  Hip flexion 3/5 3/5  Hip extension    Hip abduction    Hip adduction    Hip internal rotation    Hip external rotation    Knee flexion 4/5 4/5  Knee extension 3+/5 4-/5  Ankle dorsiflexion 3+/5 3+/5; familiar pain below her knee  Ankle plantarflexion    Ankle inversion    Ankle eversion     (Blank rows = not tested)  GAIT: Assistive device utilized: Quad cane large base Level of assistance: Modified independence Comments: decreased gait speed and stride length  Gait speed: 0.78 m/s  TODAY'S TREATMENT:                                                                                                                               DATE:  03/01/23:                                    EXERCISE LOG  Exercise Repetitions and Resistance Comments  Nustep Level 3 x 18 minutes   LAQs 2# x 3.5 mins   Standing marches  3x10   Standing Hip Adduction 3x10 bil.   Seated Hip clam shell Green x 2 mins   Seated Ham Curls    STS     Modalities  Date:  Unattended Estim: Lumbar, IFC 80-150 Hz, 15 mins, Pain Hot Pack: Lumbar, 15 mins, Pain and Tone                                    01/31/23 EXERCISE LOG  Exercise Repetitions and Resistance Comments  Nustep  L4 x 17 minutes   Rocker board  4 minutes   Isometric ball press  30 reps  Required maximal verbal and tactile cueing to limit trunk mobility   LAQ  3# x 3 minutes  Alternating LE every 10 reps   Seated hip ADD isometric  3 minutes w/ 5 second hold    Seated marching  3# x 2 minutes  Alternating LE   Seated HS curl  Red t-band x 1.5 minutes each     Blank cell = exercise not performed today                                     PATIENT EDUCATION:  Education details: POC, prognosis, healing, and goals for therapy  Person educated: Patient Education method: Explanation Education comprehension: verbalized understanding  HOME EXERCISE PROGRAM:   ASSESSMENT:  CLINICAL IMPRESSION:Pt arrived today doing fair with LBP 4-5/10. Rx focused on core activation and LE strengthening and was able to progress to standing exs today and did well. 2-3/10 pain end of session    OBJECTIVE IMPAIRMENTS: Abnormal gait, decreased activity tolerance, decreased mobility, difficulty walking, decreased ROM, decreased strength, hypomobility, impaired tone, postural dysfunction, and pain.   ACTIVITY LIMITATIONS: carrying, lifting, bending, standing, stairs, and locomotion level  PARTICIPATION LIMITATIONS: meal prep, cleaning, laundry, shopping, and community activity  PERSONAL FACTORS: Age, Time since onset of  injury/illness/exacerbation, and 3+ comorbidities: Hypertension, osteoporosis, arthritis, and chronic kidney disease  are also affecting patient's functional outcome.   REHAB POTENTIAL: Good  CLINICAL DECISION MAKING: Evolving/moderate complexity  EVALUATION COMPLEXITY: Moderate   GOALS: Goals reviewed with patient? Yes  SHORT TERM GOALS: Target date: 01/23/23  Patient will be independent with her initial HEP.  Baseline: Goal status: MET  2.  Patient will be able to complete her daily activities without her familiar pain exceeding 5/10. Baseline:  Goal status: MET  3.  Patient will be able to stand and walk for at least 15 minutes without being limited by her familiar symptoms.  Baseline:  Goal status: MET  LONG TERM GOALS: Target date: 02/14/23  Patient will be independent with her advanced HEP.  Baseline:  Goal status: MET  2.  Patient will improve her gait speed to at least 1.0 m/s for improved community mobility.  Baseline:  Goal status: IN PROGRESS  3.  Patient will be able to demonstrate proper lifting mechanics for improved function with household activities.  Baseline: 9/30: heavy cues required for proper  mechanics Goal status: IN PROGRESS  4.  Patient will be able to walk for at least 30 minutes without being limited by her familiar symptoms.  Baseline: 9/30: approximately 20 mins Goal status: IN PROGRESS  PLAN:  PT FREQUENCY: 2x/week  PT DURATION: 6 weeks  PLANNED INTERVENTIONS: Therapeutic exercises, Therapeutic activity, Neuromuscular re-education, Balance training, Gait training, Patient/Family education, Self Care, Joint mobilization, Stair training, Electrical stimulation, Spinal mobilization, Cryotherapy, Moist heat, Manual therapy, and Re-evaluation.  PLAN FOR NEXT SESSION: Nustep, therex, and modalities as needed   Gabreil [place],CHRIS, PTA 03/01/2023, 10:32 AM

## 2023-03-06 ENCOUNTER — Ambulatory Visit: Payer: Medicare Other

## 2023-03-06 DIAGNOSIS — M6281 Muscle weakness (generalized): Secondary | ICD-10-CM

## 2023-03-06 DIAGNOSIS — M62830 Muscle spasm of back: Secondary | ICD-10-CM

## 2023-03-06 DIAGNOSIS — M5416 Radiculopathy, lumbar region: Secondary | ICD-10-CM

## 2023-03-06 NOTE — Therapy (Signed)
OUTPATIENT PHYSICAL THERAPY THORACOLUMBAR TREATMENT   Patient Name: Cheryl Lee MRN: [id number] DOB:July 14, 1945, 77 y.o., female Today's Date: 03/06/2023  END OF SESSION:  PT End of Session - 03/06/23 1020     Visit Number 15    Number of Visits 18    Date for PT Re-Evaluation 03/15/23    PT Start Time 1015    PT Stop Time 1114    PT Time Calculation (min) 59 min              Past Medical History:  Diagnosis Date   Acid reflux    Arthritis    Dyspnea    HNP (herniated nucleus pulposus), lumbar    Hypercholesteremia    Hypertension    Osteoporosis    Sinus congestion    Past Surgical History:  Procedure Laterality Date   BIOPSY  08/23/2022   Procedure: BIOPSY;  Surgeon: Lemar Lofty., MD;  Location: [facility];  Service: Gastroenterology;;   CATARACT EXTRACTION     CHOLECYSTECTOMY N/A 08/23/2022   Procedure: LAPAROSCOPIC CHOLECYSTECTOMY;  Surgeon: Fritzi Mandes, MD;  Location: [facility];  Service: General;  Laterality: N/A;   COLONOSCOPY     COLONOSCOPY N/A 01/21/2020   Procedure: COLONOSCOPY;  Surgeon: Malissa Hippo, MD;  Location: [facility];  Service: Endoscopy;  Laterality: N/A;  730   ERCP N/A 08/23/2022   Procedure: ENDOSCOPIC RETROGRADE CHOLANGIOPANCREATOGRAPHY (ERCP);  Surgeon: Lemar Lofty., MD;  Location: [facility];  Service: Gastroenterology;  Laterality: N/A;   LUMBAR LAMINECTOMY/DECOMPRESSION MICRODISCECTOMY Left 05/01/2017   Procedure: Microdiscectomy - Lumbar four-five  - left;  Surgeon: Donalee Citrin, MD;  Location: [facility];  Service: Neurosurgery;  Laterality: Left;   POLYPECTOMY  01/21/2020   Procedure: POLYPECTOMY;  Surgeon: Malissa Hippo, MD;  Location: [facility];  Service: Endoscopy;;   REMOVAL OF STONES  08/23/2022   Procedure: REMOVAL OF STONES;  Surgeon: Lemar Lofty., MD;  Location: [facility];  Service: Gastroenterology;;   Right snkle repair     SPHINCTEROTOMY  08/23/2022   Procedure: Dennison Mascot;  Surgeon:  Lemar Lofty., MD;  Location: [facility];  Service: Gastroenterology;;   Patient Active Problem List   Diagnosis Date Noted   Lumbar compression fracture (HCC) 07/05/2022   Leukocytosis 07/05/2022   Stage 3a chronic kidney disease (CKD) (HCC) 07/05/2022   Lumbar nerve root impingement 07/08/2018   DDD (degenerative disc disease), lumbar 07/08/2018   Vitamin D deficiency 03/03/2018   HNP (herniated nucleus pulposus), lumbar 05/01/2017   Overweight (BMI 25.0-29.9) 12/21/2014   GERD (gastroesophageal reflux disease)    Hyperlipidemia with target LDL less than 100    Hypertension    Osteoporosis     PCP: Junie Spencer, FNP  REFERRING PROVIDER: Donalee Citrin, MD   REFERRING DIAG: Wedge compression fracture of fourth lumbar vertebra, initial encounter for closed fracture   Rationale for Evaluation and Treatment: Rehabilitation  THERAPY DIAG:  Radiculopathy, lumbar region  Muscle weakness (generalized)  Muscle spasm of back  ONSET DATE: "a while now"   SUBJECTIVE:  SUBJECTIVE STATEMENT: Pt reports 4-5/10 right low back pain today.   PERTINENT HISTORY:  Hypertension, osteoporosis, arthritis, and chronic kidney disease  PAIN:  Are you having pain? Yes: NPRS scale: 4-5/10 Pain location: low back Pain description: intermittent aching and throbbing Aggravating factors: walking, standing Relieving factors: sitting  PRECAUTIONS: None  RED FLAGS: None   WEIGHT BEARING RESTRICTIONS: No  FALLS:  Has patient fallen in last 6 months? No  LIVING ENVIRONMENT: Lives with: lives alone Lives in: House/apartment Stairs: Yes: Internal: "I avoid these steps" steps; on right going up Has following equipment at home: Quad cane large base  OCCUPATION: retired  PLOF: Independent with basic  ADLs  PATIENT GOALS: be able to stand and walk longer (she was able to walk at least 30 minutes before this pain began, but unable to do this currently) to be able to do her housework  NEXT MD VISIT: 01/03/23  OBJECTIVE:   PATIENT SURVEYS:  FOTO 51.87  SCREENING FOR RED FLAGS: Bowel or bladder incontinence: No Spinal tumors: No Cauda equina syndrome: No Compression fracture: No Abdominal aneurysm: No  COGNITION: Overall cognitive status: Within functional limits for tasks assessed     SENSATION: Patient reports no numbness or tingling  POSTURE: rounded shoulders, forward head, and flexed trunk (stands with 6 degrees of lumbar flexion)   PALPATION: TTP: bilateral lumbar paraspinals and QL  LUMBAR JOINT MOBILITY:  Hypomobile and painful throughout   LUMBAR ROM:   AROM eval  Flexion 40; prior to knee flexion   Extension 12; low back pain   Right lateral flexion 75% limited; prior to lumbar flexion and rotation  Left lateral flexion 75% limited; prior to lumbar flexion and rotation with familiar low back and left hip pain  Right rotation 50% limited  Left rotation 50% limited; familiar low back left hip pain   (Blank rows = not tested)  LOWER EXTREMITY ROM:  WFL for activities assessed  LOWER EXTREMITY MMT:    MMT Right eval Left eval  Hip flexion 3/5 3/5  Hip extension    Hip abduction    Hip adduction    Hip internal rotation    Hip external rotation    Knee flexion 4/5 4/5  Knee extension 3+/5 4-/5  Ankle dorsiflexion 3+/5 3+/5; familiar pain below her knee  Ankle plantarflexion    Ankle inversion    Ankle eversion     (Blank rows = not tested)  GAIT: Assistive device utilized: Quad cane large base Level of assistance: Modified independence Comments: decreased gait speed and stride length  Gait speed: 0.78 m/s  TODAY'S TREATMENT:                                                                                                                               DATE: 03/06/23:  EXERCISE LOG  Exercise Repetitions and Resistance Comments  Nustep Level 3 x 18 minutes   LAQs 3# x 3 mins   Standing marches Airex x 2 mins   Standing Hip Adduction 3x10 bil.   Seated Hip clam shell Green x 2 mins   Seated Ham Curls Green x 20 reps bil   Rockerboard 3 mins    Modalities  Date:  Unattended Estim: Lumbar, IFC 80-150 Hz, 15 mins, Pain Hot Pack: Lumbar, 15 mins, Pain and Tone                                    01/31/23 EXERCISE LOG  Exercise Repetitions and Resistance Comments  Nustep  L4 x 17 minutes   Rocker board  4 minutes   Isometric ball press  30 reps  Required maximal verbal and tactile cueing to limit trunk mobility   LAQ  3# x 3 minutes  Alternating LE every 10 reps   Seated hip ADD isometric  3 minutes w/ 5 second hold    Seated marching  3# x 2 minutes  Alternating LE   Seated HS curl  Red t-band x 1.5 minutes each     Blank cell = exercise not performed today                                     PATIENT EDUCATION:  Education details: POC, prognosis, healing, and goals for therapy  Person educated: Patient Education method: Explanation Education comprehension: verbalized understanding  HOME EXERCISE PROGRAM:   ASSESSMENT:  CLINICAL IMPRESSION:  Pt arrives for today's treatment session reporting 4-5/10 left low back pain.  Pt able to tolerate addition of Airex pad with standing marches today with use of BUE required for safety and stability.  Pt able to tolerate increased resistance with seated exercises today without issue.  Normal responses to estim and MH noted upon removal.  Pt reported decreased pain at completion of today's treatment session.   OBJECTIVE IMPAIRMENTS: Abnormal gait, decreased activity tolerance, decreased mobility, difficulty walking, decreased ROM, decreased strength, hypomobility, impaired tone, postural dysfunction, and pain.   ACTIVITY LIMITATIONS: carrying, lifting,  bending, standing, stairs, and locomotion level  PARTICIPATION LIMITATIONS: meal prep, cleaning, laundry, shopping, and community activity  PERSONAL FACTORS: Age, Time since onset of injury/illness/exacerbation, and 3+ comorbidities: Hypertension, osteoporosis, arthritis, and chronic kidney disease  are also affecting patient's functional outcome.   REHAB POTENTIAL: Good  CLINICAL DECISION MAKING: Evolving/moderate complexity  EVALUATION COMPLEXITY: Moderate   GOALS: Goals reviewed with patient? Yes  SHORT TERM GOALS: Target date: 01/23/23  Patient will be independent with her initial HEP.  Baseline: Goal status: MET  2.  Patient will be able to complete her daily activities without her familiar pain exceeding 5/10. Baseline:  Goal status: MET  3.  Patient will be able to stand and walk for at least 15 minutes without being limited by her familiar symptoms.  Baseline:  Goal status: MET  LONG TERM GOALS: Target date: 02/14/23  Patient will be independent with her advanced HEP.  Baseline:  Goal status: MET  2.  Patient will improve her gait speed to at least 1.0 m/s for improved community mobility.  Baseline:  Goal status: IN PROGRESS  3.  Patient will be able to demonstrate proper lifting mechanics for improved function with household activities.  Baseline: 9/30: heavy cues required for proper mechanics Goal status: IN PROGRESS  4.  Patient will be able to walk for at least 30 minutes without being limited by her familiar symptoms.  Baseline: 9/30: approximately 20 mins Goal status: IN PROGRESS  PLAN:  PT FREQUENCY: 2x/week  PT DURATION: 6 weeks  PLANNED INTERVENTIONS: Therapeutic exercises, Therapeutic activity, Neuromuscular re-education, Balance training, Gait training, Patient/Family education, Self Care, Joint mobilization, Stair training, Electrical stimulation, Spinal mobilization, Cryotherapy, Moist heat, Manual therapy, and Re-evaluation.  PLAN FOR NEXT  SESSION: Nustep, therex, and modalities as needed   Newman Pies, PTA 03/06/2023, 11:56 AM

## 2023-03-08 ENCOUNTER — Ambulatory Visit: Payer: Medicare Other | Admitting: *Deleted

## 2023-03-08 ENCOUNTER — Encounter: Payer: Self-pay | Admitting: *Deleted

## 2023-03-08 DIAGNOSIS — M6281 Muscle weakness (generalized): Secondary | ICD-10-CM

## 2023-03-08 DIAGNOSIS — M5416 Radiculopathy, lumbar region: Secondary | ICD-10-CM

## 2023-03-08 DIAGNOSIS — M62830 Muscle spasm of back: Secondary | ICD-10-CM | POA: Diagnosis not present

## 2023-03-08 NOTE — Therapy (Signed)
OUTPATIENT PHYSICAL THERAPY THORACOLUMBAR TREATMENT   Patient Name: Cheryl Lee MRN: [id number] DOB:11/27/1945, 77 y.o., female Today's Date: 03/08/2023  END OF SESSION:                                 PT End of Session - 03/08/23 0926     Visit Number 16    Number of Visits 18    Date for PT Re-Evaluation 03/15/23    PT Start Time 0926    PT Stop Time 1015    PT Time Calculation (min) 49 min              Past Medical History:  Diagnosis Date   Acid reflux    Arthritis    Dyspnea    HNP (herniated nucleus pulposus), lumbar    Hypercholesteremia    Hypertension    Osteoporosis    Sinus congestion    Past Surgical History:  Procedure Laterality Date   BIOPSY  08/23/2022   Procedure: BIOPSY;  Surgeon: Lemar Lofty., MD;  Location: [facility];  Service: Gastroenterology;;   CATARACT EXTRACTION     CHOLECYSTECTOMY N/A 08/23/2022   Procedure: LAPAROSCOPIC CHOLECYSTECTOMY;  Surgeon: Fritzi Mandes, MD;  Location: [facility];  Service: General;  Laterality: N/A;   COLONOSCOPY     COLONOSCOPY N/A 01/21/2020   Procedure: COLONOSCOPY;  Surgeon: Malissa Hippo, MD;  Location: [facility];  Service: Endoscopy;  Laterality: N/A;  730   ERCP N/A 08/23/2022   Procedure: ENDOSCOPIC RETROGRADE CHOLANGIOPANCREATOGRAPHY (ERCP);  Surgeon: Lemar Lofty., MD;  Location: [facility];  Service: Gastroenterology;  Laterality: N/A;   LUMBAR LAMINECTOMY/DECOMPRESSION MICRODISCECTOMY Left 05/01/2017   Procedure: Microdiscectomy - Lumbar four-five  - left;  Surgeon: Donalee Citrin, MD;  Location: [facility];  Service: Neurosurgery;  Laterality: Left;   POLYPECTOMY  01/21/2020   Procedure: POLYPECTOMY;  Surgeon: Malissa Hippo, MD;  [facility]: [facility];  Service: Endoscopy;;   REMOVAL OF STONES  08/23/2022   Procedure: REMOVAL OF STONES;  Surgeon: Lemar Lofty., MD;  Location: [facility];  Service: Gastroenterology;;   Right snkle repair     SPHINCTEROTOMY  08/23/2022   Procedure:  Dennison Mascot;  Surgeon: Lemar Lofty., MD;  Location: [facility];  Service: Gastroenterology;;   Patient Active Problem List   Diagnosis Date Noted   Lumbar compression fracture (HCC) 07/05/2022   Leukocytosis 07/05/2022   Stage 3a chronic kidney disease (CKD) (HCC) 07/05/2022   Lumbar nerve root impingement 07/08/2018   DDD (degenerative disc disease), lumbar 07/08/2018   Vitamin D deficiency 03/03/2018   HNP (herniated nucleus pulposus), lumbar 05/01/2017   Overweight (BMI 25.0-29.9) 12/21/2014   GERD (gastroesophageal reflux disease)    Hyperlipidemia with target LDL less than 100    Hypertension    Osteoporosis     PCP: Junie Spencer, FNP  REFERRING PROVIDER: Donalee Citrin, MD   REFERRING DIAG: Wedge compression fracture of fourth lumbar vertebra, initial encounter for closed fracture   Rationale for Evaluation and Treatment: Rehabilitation  THERAPY DIAG:  Radiculopathy, lumbar region  Muscle weakness (generalized)  Muscle spasm of back  ONSET DATE: "a while now"   SUBJECTIVE:  SUBJECTIVE STATEMENT: Pt reports 4-5/10 right low back pain today mainly with transitions. To MD Nov.5th  PERTINENT HISTORY:  Hypertension, osteoporosis, arthritis, and chronic kidney disease  PAIN:  Are you having pain? Yes: NPRS scale: 4-5/10 Pain location: low back Pain description: intermittent aching and throbbing Aggravating factors: walking, standing Relieving factors: sitting  PRECAUTIONS: None  RED FLAGS: None   WEIGHT BEARING RESTRICTIONS: No  FALLS:  Has patient fallen in last 6 months? No  LIVING ENVIRONMENT: Lives with: lives alone Lives in: House/apartment Stairs: Yes: Internal: "I avoid these steps" steps; on right going up Has following equipment at home: Quad cane large  base  OCCUPATION: retired  PLOF: Independent with basic ADLs  PATIENT GOALS: be able to stand and walk longer (she was able to walk at least 30 minutes before this pain began, but unable to do this currently) to be able to do her housework  NEXT MD VISIT: 01/03/23  OBJECTIVE:   PATIENT SURVEYS:  FOTO 51.87  SCREENING FOR RED FLAGS: Bowel or bladder incontinence: No Spinal tumors: No Cauda equina syndrome: No Compression fracture: No Abdominal aneurysm: No  COGNITION: Overall cognitive status: Within functional limits for tasks assessed     SENSATION: Patient reports no numbness or tingling  POSTURE: rounded shoulders, forward head, and flexed trunk (stands with 6 degrees of lumbar flexion)   PALPATION: TTP: bilateral lumbar paraspinals and QL  LUMBAR JOINT MOBILITY:  Hypomobile and painful throughout   LUMBAR ROM:   AROM eval  Flexion 40; prior to knee flexion   Extension 12; low back pain   Right lateral flexion 75% limited; prior to lumbar flexion and rotation  Left lateral flexion 75% limited; prior to lumbar flexion and rotation with familiar low back and left hip pain  Right rotation 50% limited  Left rotation 50% limited; familiar low back left hip pain   (Blank rows = not tested)  LOWER EXTREMITY ROM:  WFL for activities assessed  LOWER EXTREMITY MMT:    MMT Right eval Left eval  Hip flexion 3/5 3/5  Hip extension    Hip abduction    Hip adduction    Hip internal rotation    Hip external rotation    Knee flexion 4/5 4/5  Knee extension 3+/5 4-/5  Ankle dorsiflexion 3+/5 3+/5; familiar pain below her knee  Ankle plantarflexion    Ankle inversion    Ankle eversion     (Blank rows = not tested)  GAIT: Assistive device utilized: Quad cane large base Level of assistance: Modified independence Comments: decreased gait speed and stride length  Gait speed: 0.78 m/s  TODAY'S TREATMENT:                                                                                                                               DATE: 03/08/23:  EXERCISE LOG  Exercise Repetitions and Resistance Comments  Nustep Level 3 x 18 minutes   LAQs    Standing marches  x 2 mins   Standing Hip Abduction 3x10 bil.   Seated Hip clam shell    Seated Ham Curls    Rockerboard    Manual STW/TPR to LT side LB paras and QL Modalities  Date:  Unattended Estim: Lumbar, IFC 80-150 Hz, 15 mins, Pain Hot Pack: Lumbar, 15 mins, Pain and Tone                                    01/31/23 EXERCISE LOG  Exercise Repetitions and Resistance Comments  Nustep  L4 x 17 minutes   Rocker board  4 minutes   Isometric ball press  30 reps  Required maximal verbal and tactile cueing to limit trunk mobility   LAQ  3# x 3 minutes  Alternating LE every 10 reps   Seated hip ADD isometric  3 minutes w/ 5 second hold    Seated marching  3# x 2 minutes  Alternating LE   Seated HS curl  Red t-band x 1.5 minutes each     Blank cell = exercise not performed today                                     PATIENT EDUCATION:  Education details: POC, prognosis, healing, and goals for therapy  Person educated: Patient Education method: Explanation Education comprehension: verbalized understanding  HOME EXERCISE PROGRAM:   ASSESSMENT:  CLINICAL IMPRESSION:  KX  Pt arrives for today's treatment session reporting 4-5/10 left low back pain mainly with transitional movements. Pt was able to complete some exs today, but with LT LBP and mm tightening. STW/ and TPR performed to LT LB paras and QL with good release. HMP and IFC end of session tolerated well. LTG's are ongoing due to mobility deficits   OBJECTIVE IMPAIRMENTS: Abnormal gait, decreased activity tolerance, decreased mobility, difficulty walking, decreased ROM, decreased strength, hypomobility, impaired tone, postural dysfunction, and pain.   ACTIVITY LIMITATIONS: carrying, lifting, bending, standing,  stairs, and locomotion level  PARTICIPATION LIMITATIONS: meal prep, cleaning, laundry, shopping, and community activity  PERSONAL FACTORS: Age, Time since onset of injury/illness/exacerbation, and 3+ comorbidities: Hypertension, osteoporosis, arthritis, and chronic kidney disease  are also affecting patient's functional outcome.   REHAB POTENTIAL: Good  CLINICAL DECISION MAKING: Evolving/moderate complexity  EVALUATION COMPLEXITY: Moderate   GOALS: Goals reviewed with patient? Yes  SHORT TERM GOALS: Target date: 01/23/23  Patient will be independent with her initial HEP.  Baseline: Goal status: MET  2.  Patient will be able to complete her daily activities without her familiar pain exceeding 5/10. Baseline:  Goal status: MET  3.  Patient will be able to stand and walk for at least 15 minutes without being limited by her familiar symptoms.  Baseline:  Goal status: MET  LONG TERM GOALS: Target date: 02/14/23  Patient will be independent with her advanced HEP.  Baseline:  Goal status: MET  2.  Patient will improve her gait speed to at least 1.0 m/s for improved community mobility.  Baseline:  Goal status: IN PROGRESS  3.  Patient will be able to demonstrate proper lifting mechanics for improved function with household activities.  Baseline: 9/30: heavy cues required for proper mechanics Goal status:  IN PROGRESS  4.  Patient will be able to walk for at least 30 minutes without being limited by her familiar symptoms.  Baseline: 9/30: approximately 20 mins Goal status: IN PROGRESS  PLAN:  PT FREQUENCY: 2x/week  PT DURATION: 6 weeks  PLANNED INTERVENTIONS: Therapeutic exercises, Therapeutic activity, Neuromuscular re-education, Balance training, Gait training, Patient/Family education, Self Care, Joint mobilization, Stair training, Electrical stimulation, Spinal mobilization, Cryotherapy, Moist heat, Manual therapy, and Re-evaluation.  PLAN FOR NEXT SESSION: Nustep,  therex, and modalities as needed   Heavan Francom,CHRIS, PTA 03/08/2023, 10:24 AM

## 2023-03-11 ENCOUNTER — Ambulatory Visit: Payer: Medicare Other

## 2023-03-11 DIAGNOSIS — M62830 Muscle spasm of back: Secondary | ICD-10-CM | POA: Diagnosis not present

## 2023-03-11 DIAGNOSIS — M5416 Radiculopathy, lumbar region: Secondary | ICD-10-CM | POA: Diagnosis not present

## 2023-03-11 DIAGNOSIS — M6281 Muscle weakness (generalized): Secondary | ICD-10-CM | POA: Diagnosis not present

## 2023-03-11 NOTE — Therapy (Signed)
OUTPATIENT PHYSICAL THERAPY THORACOLUMBAR TREATMENT   Patient Name: Cheryl Lee MRN: [id number] DOB:1945-12-06, 77 y.o., female Today's Date: 03/11/2023  END OF SESSION:                                 PT End of Session - 03/11/23 0935     Visit Number 17    Number of Visits 18    Date for PT Re-Evaluation 03/15/23    PT Start Time 0930    PT Stop Time 1025    PT Time Calculation (min) 55 min              Past Medical History:  Diagnosis Date   Acid reflux    Arthritis    Dyspnea    HNP (herniated nucleus pulposus), lumbar    Hypercholesteremia    Hypertension    Osteoporosis    Sinus congestion    Past Surgical History:  Procedure Laterality Date   BIOPSY  08/23/2022   Procedure: BIOPSY;  Surgeon: Lemar Lofty., MD;  Location: [facility];  Service: Gastroenterology;;   CATARACT EXTRACTION     CHOLECYSTECTOMY N/A 08/23/2022   Procedure: LAPAROSCOPIC CHOLECYSTECTOMY;  Surgeon: Fritzi Mandes, MD;  Location: [facility];  Service: General;  Laterality: N/A;   COLONOSCOPY     COLONOSCOPY N/A 01/21/2020   Procedure: COLONOSCOPY;  Surgeon: Malissa Hippo, MD;  Location: [facility];  Service: Endoscopy;  Laterality: N/A;  730   ERCP N/A 08/23/2022   Procedure: ENDOSCOPIC RETROGRADE CHOLANGIOPANCREATOGRAPHY (ERCP);  Surgeon: Lemar Lofty., MD;  Location: [facility];  Service: Gastroenterology;  Laterality: N/A;   LUMBAR LAMINECTOMY/DECOMPRESSION MICRODISCECTOMY Left 05/01/2017   Procedure: Microdiscectomy - Lumbar four-five  - left;  Surgeon: Donalee Citrin, MD;  Location[facility];  Service: Neurosurgery;  Laterality: Left;   POLYPECTOMY  01/21/2020   Procedure: POLYPECTOMY;  Surgeon: Malissa Hippo, MD;  Location: [facility];  Service: Endoscopy;;   REMOVAL OF STONES  08/23/2022   Procedure: REMOVAL OF STONES;  Surgeon: Lemar Lofty., MD;  Location: [facility];  Service: Gastroenterology;;   Right snkle repair     SPHINCTEROTOMY  08/23/2022   Procedure:  Dennison Mascot;  Surgeon: Lemar Lofty., MD;  Location: [facility];  Service: Gastroenterology;;   Patient Active Problem List   Diagnosis Date Noted   Lumbar compression fracture (HCC) 07/05/2022   Leukocytosis 07/05/2022   Stage 3a chronic kidney disease (CKD) (HCC) 07/05/2022   Lumbar nerve root impingement 07/08/2018   DDD (degenerative disc disease), lumbar 07/08/2018   Vitamin D deficiency 03/03/2018   HNP (herniated nucleus pulposus), lumbar 05/01/2017   Overweight (BMI 25.0-29.9) 12/21/2014   GERD (gastroesophageal reflux disease)    Hyperlipidemia with target LDL less than 100    Hypertension    Osteoporosis     PCP: Junie Spencer, FNP  REFERRING PROVIDER: Donalee Citrin, MD   REFERRING DIAG: Wedge compression fracture of fourth lumbar vertebra, initial encounter for closed fracture   Rationale for Evaluation and Treatment: Rehabilitation  THERAPY DIAG:  Radiculopathy, lumbar region  Muscle weakness (generalized)  Muscle spasm of back  ONSET DATE: "a while now"   SUBJECTIVE:  SUBJECTIVE STATEMENT: Pt reports 3-4/10 left low back pain with transfers and ambulation, but 0/10 while at rest.   PERTINENT HISTORY:  Hypertension, osteoporosis, arthritis, and chronic kidney disease  PAIN:  Are you having pain? Yes: NPRS scale: 3-4/10 Pain location: low back Pain description: intermittent aching and throbbing Aggravating factors: walking, standing Relieving factors: sitting  PRECAUTIONS: None  RED FLAGS: None   WEIGHT BEARING RESTRICTIONS: No  FALLS:  Has patient fallen in last 6 months? No  LIVING ENVIRONMENT: Lives with: lives alone Lives in: House/apartment Stairs: Yes: Internal: "I avoid these steps" steps; on right going up Has following equipment at home: Quad  cane large base  OCCUPATION: retired  PLOF: Independent with basic ADLs  PATIENT GOALS: be able to stand and walk longer (she was able to walk at least 30 minutes before this pain began, but unable to do this currently) to be able to do her housework  NEXT MD VISIT: 01/03/23  OBJECTIVE:   PATIENT SURVEYS:  FOTO 51.87  SCREENING FOR RED FLAGS: Bowel or bladder incontinence: No Spinal tumors: No Cauda equina syndrome: No Compression fracture: No Abdominal aneurysm: No  COGNITION: Overall cognitive status: Within functional limits for tasks assessed     SENSATION: Patient reports no numbness or tingling  POSTURE: rounded shoulders, forward head, and flexed trunk (stands with 6 degrees of lumbar flexion)   PALPATION: TTP: bilateral lumbar paraspinals and QL  LUMBAR JOINT MOBILITY:  Hypomobile and painful throughout   LUMBAR ROM:   AROM eval  Flexion 40; prior to knee flexion   Extension 12; low back pain   Right lateral flexion 75% limited; prior to lumbar flexion and rotation  Left lateral flexion 75% limited; prior to lumbar flexion and rotation with familiar low back and left hip pain  Right rotation 50% limited  Left rotation 50% limited; familiar low back left hip pain   (Blank rows = not tested)  LOWER EXTREMITY ROM:  WFL for activities assessed  LOWER EXTREMITY MMT:    MMT Right eval Left eval  Hip flexion 3/5 3/5  Hip extension    Hip abduction    Hip adduction    Hip internal rotation    Hip external rotation    Knee flexion 4/5 4/5  Knee extension 3+/5 4-/5  Ankle dorsiflexion 3+/5 3+/5; familiar pain below her knee  Ankle plantarflexion    Ankle inversion    Ankle eversion     (Blank rows = not tested)  GAIT: Assistive device utilized: Quad cane large base Level of assistance: Modified independence Comments: decreased gait speed and stride length  Gait speed: 0.78 m/s  TODAY'S TREATMENT:                                                                                                                               DATE: 03/11/23:  EXERCISE LOG  Exercise Repetitions and Resistance Comments  Nustep Level 4 x 18 minutes   LAQs    Standing marches Airex x 2.5 mins   Standing Hip Abduction 2# x 25 reps bil   Seated Hip clam shell    Seated Ham Curls    Rockerboard 5 mins   Manual STW/TPR to LT side LB paras and QL Modalities  Date:  Unattended Estim: Lumbar, IFC 80-150 Hz, 15 mins, Pain Hot Pack: Lumbar, 15 mins, Pain and Tone                                    01/31/23 EXERCISE LOG  Exercise Repetitions and Resistance Comments  Nustep  L4 x 17 minutes   Rocker board  4 minutes   Isometric ball press  30 reps  Required maximal verbal and tactile cueing to limit trunk mobility   LAQ  3# x 3 minutes  Alternating LE every 10 reps   Seated hip ADD isometric  3 minutes w/ 5 second hold    Seated marching  3# x 2 minutes  Alternating LE   Seated HS curl  Red t-band x 1.5 minutes each     Blank cell = exercise not performed today                                     PATIENT EDUCATION:  Education details: POC, prognosis, healing, and goals for therapy  Person educated: Patient Education method: Explanation Education comprehension: verbalized understanding  HOME EXERCISE PROGRAM:   ASSESSMENT:  CLINICAL IMPRESSION:  KX  Pt arrives for today's treatment session reporting 3-4/10 left low back pain with transfers and ambulation.  Pt reports that her back is feeling a little better since her last injection. Pt able to tolerate increased time, resistance, or reps with all exercises performed today.  Pt ready for discharge at next treatment session.  Normal responses to estim and MH noted upon removal.  Pt reported 2/10 left low back pain at completion of today's treatment session.   OBJECTIVE IMPAIRMENTS: Abnormal gait, decreased activity tolerance, decreased mobility, difficulty walking,  decreased ROM, decreased strength, hypomobility, impaired tone, postural dysfunction, and pain.   ACTIVITY LIMITATIONS: carrying, lifting, bending, standing, stairs, and locomotion level  PARTICIPATION LIMITATIONS: meal prep, cleaning, laundry, shopping, and community activity  PERSONAL FACTORS: Age, Time since onset of injury/illness/exacerbation, and 3+ comorbidities: Hypertension, osteoporosis, arthritis, and chronic kidney disease  are also affecting patient's functional outcome.   REHAB POTENTIAL: Good  CLINICAL DECISION MAKING: Evolving/moderate complexity  EVALUATION COMPLEXITY: Moderate   GOALS: Goals reviewed with patient? Yes  SHORT TERM GOALS: Target date: 01/23/23  Patient will be independent with her initial HEP.  Baseline: Goal status: MET  2.  Patient will be able to complete her daily activities without her familiar pain exceeding 5/10. Baseline:  Goal status: MET  3.  Patient will be able to stand and walk for at least 15 minutes without being limited by her familiar symptoms.  Baseline:  Goal status: MET  LONG TERM GOALS: Target date: 02/14/23  Patient will be independent with her advanced HEP.  Baseline:  Goal status: MET  2.  Patient will improve her gait speed to at least 1.0 m/s for improved community mobility.  Baseline:  Goal status: IN PROGRESS  3.  Patient will be  able to demonstrate proper lifting mechanics for improved function with household activities.  Baseline: 9/30: heavy cues required for proper mechanics Goal status: IN PROGRESS  4.  Patient will be able to walk for at least 30 minutes without being limited by her familiar symptoms.  Baseline: 9/30: approximately 20 mins Goal status: IN PROGRESS  PLAN:  PT FREQUENCY: 2x/week  PT DURATION: 6 weeks  PLANNED INTERVENTIONS: Therapeutic exercises, Therapeutic activity, Neuromuscular re-education, Balance training, Gait training, Patient/Family education, Self Care, Joint  mobilization, Stair training, Electrical stimulation, Spinal mobilization, Cryotherapy, Moist heat, Manual therapy, and Re-evaluation.  PLAN FOR NEXT SESSION: Nustep, therex, and modalities as needed   Newman Pies, PTA 03/11/2023, 10:32 AM

## 2023-03-13 ENCOUNTER — Ambulatory Visit: Payer: Medicare Other

## 2023-03-13 DIAGNOSIS — M5416 Radiculopathy, lumbar region: Secondary | ICD-10-CM | POA: Diagnosis not present

## 2023-03-13 DIAGNOSIS — M6281 Muscle weakness (generalized): Secondary | ICD-10-CM | POA: Diagnosis not present

## 2023-03-13 DIAGNOSIS — M62830 Muscle spasm of back: Secondary | ICD-10-CM | POA: Diagnosis not present

## 2023-03-13 NOTE — Therapy (Signed)
OUTPATIENT PHYSICAL THERAPY THORACOLUMBAR TREATMENT   Patient Name: CRYSTALLE LEISS MRN: [id number] DOB:24-Mar-1946, 77 y.o., female Today's Date: 03/13/2023  END OF SESSION:                                 PT End of Session - 03/13/23 0933     Visit Number 18    Number of Visits 18    Date for PT Re-Evaluation 03/15/23    PT Start Time 0930    PT Stop Time 1013    PT Time Calculation (min) 43 min    Activity Tolerance Patient tolerated treatment well    Behavior During Therapy WFL for tasks assessed/performed               Past Medical History:  Diagnosis Date   Acid reflux    Arthritis    Dyspnea    HNP (herniated nucleus pulposus), lumbar    Hypercholesteremia    Hypertension    Osteoporosis    Sinus congestion    Past Surgical History:  Procedure Laterality Date   BIOPSY  08/23/2022   Procedure: BIOPSY;  Surgeon: Lemar Lofty., MD;  Location: [facility];  Service: Gastroenterology;;   CATARACT EXTRACTION     CHOLECYSTECTOMY N/A 08/23/2022   Procedure: LAPAROSCOPIC CHOLECYSTECTOMY;  Surgeon: Fritzi Mandes, MD;  Location: [facility];  Service: General;  Laterality: N/A;   COLONOSCOPY     COLONOSCOPY N/A 01/21/2020   Procedure: COLONOSCOPY;  Surgeon: Malissa Hippo, MD;  Location: [facility];  Service: Endoscopy;  Laterality: N/A;  730   ERCP N/A 08/23/2022   Procedure: ENDOSCOPIC RETROGRADE CHOLANGIOPANCREATOGRAPHY (ERCP);  Surgeon: Lemar Lofty., MD;  Location: [facility];  Service: Gastroenterology;  Laterality: N/A;   LUMBAR LAMINECTOMY/DECOMPRESSION MICRODISCECTOMY Left 05/01/2017   Procedure: Microdiscectomy - Lumbar four-five  - left;  Surgeon: Donalee Citrin, MD;  Location: [facility];  Service: Neurosurgery;  Laterality: Left;   POLYPECTOMY  01/21/2020   Procedure: POLYPECTOMY;  Surgeon: Malissa Hippo, MD;  Location: [facility];  Service: Endoscopy;;   REMOVAL OF STONES  08/23/2022   Procedure: REMOVAL OF STONES;  Surgeon: Lemar Lofty.,  MD;  Location: [facility];  Service: Gastroenterology;;   Right snkle repair     SPHINCTEROTOMY  08/23/2022   Procedure: Dennison Mascot;  Surgeon: Lemar Lofty., MD;  Location: [facility];  Service: Gastroenterology;;   Patient Active Problem List   Diagnosis Date Noted   Lumbar compression fracture (HCC) 07/05/2022   Leukocytosis 07/05/2022   Stage 3a chronic kidney disease (CKD) (HCC) 07/05/2022   Lumbar nerve root impingement 07/08/2018   DDD (degenerative disc disease), lumbar 07/08/2018   Vitamin D deficiency 03/03/2018   HNP (herniated nucleus pulposus), lumbar 05/01/2017   Overweight (BMI 25.0-29.9) 12/21/2014   GERD (gastroesophageal reflux disease)    Hyperlipidemia with target LDL less than 100    Hypertension    Osteoporosis     PCP: Junie Spencer, FNP  REFERRING PROVIDER: Donalee Citrin, MD   REFERRING DIAG: Wedge compression fracture of fourth lumbar vertebra, initial encounter for closed fracture   Rationale for Evaluation and Treatment: Rehabilitation  THERAPY DIAG:  Radiculopathy, lumbar region  Muscle weakness (generalized)  ONSET DATE: "a while now"   SUBJECTIVE:  SUBJECTIVE STATEMENT: Patient reports that her left hip is hurting a little when she is up and walking, but this is one of the side effects from her medications. She is unsure if she has gotten any better with therapy or not.   PERTINENT HISTORY:  Hypertension, osteoporosis, arthritis, and chronic kidney disease  PAIN:  Are you having pain? Yes: NPRS scale: 3-4/10 Pain location: low back Pain description: intermittent aching and throbbing Aggravating factors: walking, standing Relieving factors: sitting  PRECAUTIONS: None  RED FLAGS: None   WEIGHT BEARING RESTRICTIONS: No  FALLS:  Has patient fallen  in last 6 months? No  LIVING ENVIRONMENT: Lives with: lives alone Lives in: House/apartment Stairs: Yes: Internal: "I avoid these steps" steps; on right going up Has following equipment at home: Quad cane large base  OCCUPATION: retired  PLOF: Independent with basic ADLs  PATIENT GOALS: be able to stand and walk longer (she was able to walk at least 30 minutes before this pain began, but unable to do this currently) to be able to do her housework  NEXT MD VISIT: 03/19/23  OBJECTIVE:   PATIENT SURVEYS:  FOTO 51.87  SCREENING FOR RED FLAGS: Bowel or bladder incontinence: No Spinal tumors: No Cauda equina syndrome: No Compression fracture: No Abdominal aneurysm: No  COGNITION: Overall cognitive status: Within functional limits for tasks assessed     SENSATION: Patient reports no numbness or tingling  POSTURE: rounded shoulders, forward head, and flexed trunk (stands with 6 degrees of lumbar flexion)   PALPATION: TTP: bilateral lumbar paraspinals and QL  LUMBAR JOINT MOBILITY:  Hypomobile and painful throughout   LUMBAR ROM:   AROM eval 03/13/23  Flexion 40; prior to knee flexion  32  Extension 12; low back pain  20  Right lateral flexion 75% limited; prior to lumbar flexion and rotation 50% limited; "pulls" in left low back  Left lateral flexion 75% limited; prior to lumbar flexion and rotation with familiar low back and left hip pain 50% limited; "pulls" in left low back  Right rotation 50% limited 25% limited; "sore"   Left rotation 50% limited; familiar low back left hip pain 50% limited; "sore" (L rotation more than R)    (Blank rows = not tested)  LOWER EXTREMITY ROM:  WFL for activities assessed  LOWER EXTREMITY MMT:    MMT Right eval Left eval  Hip flexion 3/5 3/5  Hip extension    Hip abduction    Hip adduction    Hip internal rotation    Hip external rotation    Knee flexion 4/5 4/5  Knee extension 3+/5 4-/5  Ankle dorsiflexion 3+/5 3+/5;  familiar pain below her knee  Ankle plantarflexion    Ankle inversion    Ankle eversion     (Blank rows = not tested)  GAIT: Assistive device utilized: Quad cane large base Level of assistance: Modified independence Comments: decreased gait speed and stride length  Gait speed: 0.78 m/s  TODAY'S TREATMENT:  DATE:                                    03/13/23 EXERCISE LOG  Exercise Repetitions and Resistance Comments  Nustep L4 x 19 minutes    Seated hip ADD isometric  3 minutes w/ 5 second hold  Required cueing throughout to maintain a 5 second isometric hold   LAQ 4# x 3 minutes  Alternating LE  Seated marching  4# x 2 minutes  Alternating LE        Blank cell = exercise not performed today                                     03/11/23: EXERCISE LOG  Exercise Repetitions and Resistance Comments  Nustep Level 4 x 18 minutes   LAQs    Standing marches Airex x 2.5 mins   Standing Hip Abduction 2# x 25 reps bil   Seated Hip clam shell    Seated Ham Curls    Rockerboard 5 mins   Manual STW/TPR to LT side LB paras and QL Modalities  Date:  Unattended Estim: Lumbar, IFC 80-150 Hz, 15 mins, Pain Hot Pack: Lumbar, 15 mins, Pain and Tone                                    01/31/23 EXERCISE LOG  Exercise Repetitions and Resistance Comments  Nustep  L4 x 17 minutes   Rocker board  4 minutes   Isometric ball press  30 reps  Required maximal verbal and tactile cueing to limit trunk mobility   LAQ  3# x 3 minutes  Alternating LE every 10 reps   Seated hip ADD isometric  3 minutes w/ 5 second hold    Seated marching  3# x 2 minutes  Alternating LE   Seated HS curl  Red t-band x 1.5 minutes each     Blank cell = exercise not performed today                                     PATIENT EDUCATION:  Education details: POC, prognosis, healing, and goals for therapy   Person educated: Patient Education method: Explanation Education comprehension: verbalized understanding  HOME EXERCISE PROGRAM: [email or web address]  ASSESSMENT:  CLINICAL IMPRESSION:  KX  Patient has made fair progress with skilled physical therapy as evidenced by her subjective reports, objective measures, functional mobility, and progress toward her goals. She was able to meet all of her short term goals for physical therapy. However, she was unable to meet her long term goals for improved gait speed and function with activities such as lifting as she continues to exhibit significant trunk flexion despite significant cueing. Her HEP was reviewed and she reported feeling comfortable with these interventions. She reported feeling comfortable being discharged at this time.   PHYSICAL THERAPY DISCHARGE SUMMARY  Visits from Start of Care: 18  Current functional level related to goals / functional outcomes: Patient was able to meet all of her short term goals for physical therapy.    Remaining deficits: Pain, gait speed, lifting mechanics, and muscular endurance   Education / Equipment: HEP   Patient agrees to  discharge. Patient goals were partially met. Patient is being discharged due to maximized rehab potential.    OBJECTIVE IMPAIRMENTS: Abnormal gait, decreased activity tolerance, decreased mobility, difficulty walking, decreased ROM, decreased strength, hypomobility, impaired tone, postural dysfunction, and pain.   ACTIVITY LIMITATIONS: carrying, lifting, bending, standing, stairs, and locomotion level  PARTICIPATION LIMITATIONS: meal prep, cleaning, laundry, shopping, and community activity  PERSONAL FACTORS: Age, Time since onset of injury/illness/exacerbation, and 3+ comorbidities: Hypertension, osteoporosis, arthritis, and chronic kidney disease  are also affecting patient's functional outcome.   REHAB POTENTIAL: Good  CLINICAL DECISION MAKING: Evolving/moderate  complexity  EVALUATION COMPLEXITY: Moderate   GOALS: Goals reviewed with patient? Yes  SHORT TERM GOALS: Target date: 01/23/23  Patient will be independent with her initial HEP.  Baseline: Goal status: MET  2.  Patient will be able to complete her daily activities without her familiar pain exceeding 5/10. Baseline:  Goal status: MET  3.  Patient will be able to stand and walk for at least 15 minutes without being limited by her familiar symptoms.  Baseline:  Goal status: MET  LONG TERM GOALS: Target date: 02/14/23  Patient will be independent with her advanced HEP.  Baseline:  Goal status: MET  2.  Patient will improve her gait speed to at least 1.0 m/s for improved community mobility.  Baseline: 0.51 m/s without an assistive device Goal status: NOT MET  3.  Patient will be able to demonstrate proper lifting mechanics for improved function with household activities.  Baseline: 9/30: heavy cues required for proper mechanics, 03/13/23: exhibited significant trunk flexion with minimal knee flexion Goal status: NOT MET  4.  Patient will be able to walk for at least 30 minutes without being limited by her familiar symptoms.  Baseline: 9/30: approximately 20 mins Goal status: NOT MET  PLAN:  PT FREQUENCY: 2x/week  PT DURATION: 6 weeks  PLANNED INTERVENTIONS: Therapeutic exercises, Therapeutic activity, Neuromuscular re-education, Balance training, Gait training, Patient/Family education, Self Care, Joint mobilization, Stair training, Electrical stimulation, Spinal mobilization, Cryotherapy, Moist heat, Manual therapy, and Re-evaluation.  PLAN FOR NEXT SESSION: Nustep, therex, and modalities as needed   Granville Lewis, PT 03/13/2023, 12:05 PM

## 2023-03-19 DIAGNOSIS — Z6826 Body mass index (BMI) 26.0-26.9, adult: Secondary | ICD-10-CM | POA: Diagnosis not present

## 2023-03-19 DIAGNOSIS — M5416 Radiculopathy, lumbar region: Secondary | ICD-10-CM | POA: Diagnosis not present

## 2023-04-03 ENCOUNTER — Ambulatory Visit (INDEPENDENT_AMBULATORY_CARE_PROVIDER_SITE_OTHER): Payer: Medicare Other

## 2023-04-03 VITALS — Ht 60.0 in | Wt 135.0 lb

## 2023-04-03 DIAGNOSIS — Z0000 Encounter for general adult medical examination without abnormal findings: Secondary | ICD-10-CM

## 2023-04-03 DIAGNOSIS — Z780 Asymptomatic menopausal state: Secondary | ICD-10-CM

## 2023-04-03 NOTE — Progress Notes (Signed)
 Please attest and cosign this visit due to patients primary care provider not being in the office at the time the visit was completed.   Because this visit was a virtual/telehealth visit,  certain criteria was not obtained, such a blood pressure, CBG if applicable, and timed get up and go. Any medications not marked as "taking" were not mentioned during the medication reconciliation part of the visit. Any vitals not documented were not able to be obtained due to this being a telehealth visit or patient was unable to self-report a recent blood pressure reading due to a lack of equipment at home via telehealth. Vitals that have been documented are verbally provided by the patient.   Subjective:   Cheryl Lee is a 77 y.o. female who presents for Medicare Annual (Subsequent) preventive examination.  Visit Complete: Virtual I connected with  [name] on 04/03/23 by a audio enabled telemedicine application and verified that I am speaking with the correct person using two identifiers.  Patient Location: Home  Provider Location: Home Office  I discussed the limitations of evaluation and management by telemedicine. The patient expressed understanding and agreed to proceed.  Vital Signs: Because this visit was a virtual/telehealth visit, some criteria may be missing or patient reported. Any vitals not documented were not able to be obtained and vitals that have been documented are patient reported.  Patient Medicare AWV questionnaire was completed by the patient on na; I have confirmed that all information answered by patient is correct and no changes since this date.  Cardiac Risk Factors include: advanced age (>46men, >38 women);dyslipidemia;hypertension;sedentary lifestyle     Objective:    Today's Vitals   04/03/23 0900  Weight: 135 lb (61.2 kg)  Height: 5' (1.524 m)   Body mass index is 26.37 kg/m.     04/03/2023    9:00 AM 01/02/2023    9:26 AM 08/23/2022    9:59 AM  08/14/2022    1:59 PM 08/14/2022    1:20 PM 07/05/2022    2:24 AM 01/09/2022   11:46 AM  Advanced Directives  Does Patient Have a Medical Advance Directive? No No No No No No No  Would patient like information on creating a medical advance directive? No - Patient declined  No - Patient declined   No - Patient declined No - Patient declined    Current Medications (verified) Outpatient Encounter Medications as of 04/03/2023  Medication Sig   acetaminophen (TYLENOL) 650 MG CR tablet Take 1,300 mg by mouth every 8 (eight) hours as needed for pain.   Ascorbic Acid (VITAMIN C) 500 MG CAPS Take 500 mg by mouth daily.   aspirin 81 MG tablet Take 1 tablet (81 mg total) by mouth daily.   atorvastatin (LIPITOR) 10 MG tablet Take 1 tablet (10 mg total) by mouth daily.   fluticasone (FLONASE) 50 MCG/ACT nasal spray Place 2 sprays into both nostrils daily. (Patient taking differently: Place 2 sprays into both nostrils in the morning and at bedtime.)   lisinopril-hydrochlorothiazide (ZESTORETIC) 20-12.5 MG tablet Take 0.5 tablets by mouth daily.   Multiple Vitamins-Minerals (MULTIVITAMINS THER. W/MINERALS) TABS Take 1 tablet by mouth daily.   omeprazole (PRILOSEC) 20 MG capsule Take 1 capsule (20 mg total) by mouth daily.   Polyethyl Glycol-Propyl Glycol (SYSTANE) 0.4-0.3 % SOLN Place 1 drop into both eyes daily as needed (Dry eye).   PROLIA 60 MG/ML SOSY injection Inject 60 mg into the skin once.   tiZANidine (ZANAFLEX) 2 MG tablet Take  2 mg by mouth every 8 (eight) hours as needed for muscle spasms. As needed for back pain   trolamine salicylate (ASPERCREME) 10 % cream Apply 1 Application topically daily as needed for muscle pain (cramps).   No facility-administered encounter medications on file as of 04/03/2023.    Allergies (verified) Lycopene   History: Past Medical History:  Diagnosis Date   Acid reflux    Arthritis    Dyspnea    HNP (herniated nucleus pulposus), lumbar    Hypercholesteremia     Hypertension    Osteoporosis    Sinus congestion    Past Surgical History:  Procedure Laterality Date   BIOPSY  08/23/2022   Procedure: BIOPSY;  Surgeon: Lemar Lofty., MD;  Location: [facility];  Service: Gastroenterology;;   CATARACT EXTRACTION     CHOLECYSTECTOMY N/A 08/23/2022   Procedure: LAPAROSCOPIC CHOLECYSTECTOMY;  Surgeon: Fritzi Mandes, MD;  Location: [facility];  Service: General;  Laterality: N/A;   COLONOSCOPY     COLONOSCOPY N/A 01/21/2020   Procedure: COLONOSCOPY;  Surgeon: Malissa Hippo, MD;  Location: [facility];  Service: Endoscopy;  Laterality: N/A;  730   ERCP N/A 08/23/2022   Procedure: ENDOSCOPIC RETROGRADE CHOLANGIOPANCREATOGRAPHY (ERCP);  Surgeon: Lemar Lofty., MD;  Location: [facility];  Service: Gastroenterology;  Laterality: N/A;   LUMBAR LAMINECTOMY/DECOMPRESSION MICRODISCECTOMY Left 05/01/2017   Procedure: Microdiscectomy - Lumbar four-five  - left;  Surgeon: Donalee Citrin, MD;  Location: [facility];  Service: Neurosurgery;  Laterality: Left;   POLYPECTOMY  01/21/2020   Procedure: POLYPECTOMY;  Surgeon: Malissa Hippo, MD;  Location: [facility];  Service: Endoscopy;;   REMOVAL OF STONES  08/23/2022   Procedure: REMOVAL OF STONES;  Surgeon: Meridee Score Netty Starring., MD;  Location: [facility];  Service: Gastroenterology;;   Right snkle repair     SPHINCTEROTOMY  08/23/2022   Procedure: Dennison Mascot;  Surgeon: Mansouraty, Netty Starring., MD;  Location: [facility];  Service: Gastroenterology;;   Family History  Problem Relation Age of Onset   Cancer Mother    Alzheimer's disease Mother    Heart disease Mother    Early death Father    Diabetes Sister    Diabetes Sister    Cancer Sister 59       uterine   Hypertension Sister    Healthy Daughter    Stroke Brother    Breast cancer Neg Hx    Social History   Socioeconomic History   Marital status: Divorced    Spouse name: Not on file   Number of children: 1   Years of education: 12   Highest  education level: 12th grade  Occupational History   Occupation: retired  Tobacco Use   Smoking status: Never   Smokeless tobacco: Never  Vaping Use   Vaping status: Never Used  Substance and Sexual Activity   Alcohol use: No   Drug use: No   Sexual activity: Not Currently  Other Topics Concern   Not on file  Social History Narrative   Lives alone. Daughter lives nearby   Brother lives next door.   Social Determinants of Health   Financial Resource Strain: Low Risk  (04/03/2023)   Overall Financial Resource Strain (CARDIA)    Difficulty of Paying Living Expenses: Not hard at all  Food Insecurity: No Food Insecurity (04/03/2023)   Hunger Vital Sign    Worried About Running Out of Food in the Last Year: Never true    Ran Out of Food in the  Last Year: Never true  Transportation Needs: No Transportation Needs (04/03/2023)   PRAPARE - Administrator, Civil Service (Medical): No    Lack of Transportation (Non-Medical): No  Physical Activity: Insufficiently Active (04/03/2023)   Exercise Vital Sign    Days of Exercise per Week: 3 days    Minutes of Exercise per Session: 20 min  Stress: No Stress Concern Present (04/03/2023)   [employer] of Occupational Health - Occupational Stress Questionnaire    Feeling of Stress : Not at all  Social Connections: Moderately Isolated (04/03/2023)   Social Connection and Isolation Panel [NHANES]    Frequency of Communication with Friends and Family: More than three times a week    Frequency of Social Gatherings with Friends and Family: More than three times a week    Attends Religious Services: More than 4 times per year    Active Member of [employer] or Organizations: No    Attends Engineer, structural: Never    Marital Status: Divorced    Tobacco Counseling Counseling given: Yes   Clinical Intake:  Pre-visit preparation completed: Yes  Pain : No/denies pain (pt states her left leg and back hurt but only when she  walks)     BMI - recorded: 26.37 Nutritional Status: BMI 25 -29 Overweight Nutritional Risks: None Diabetes: No  How often do you need to have someone help you when you read instructions, pamphlets, or other written materials from your doctor or pharmacy?: 1 - Never  Interpreter Needed?: No  Information entered by ::  W. CMA   Activities of Daily Living    04/03/2023    9:16 AM 08/14/2022    1:23 PM  In your present state of health, do you have any difficulty performing the following activities:  Hearing? 0   Vision? 0   Difficulty concentrating or making decisions? 0   Walking or climbing stairs? 0   Dressing or bathing? 0   Doing errands, shopping? 0 0  Preparing Food and eating ? N   Using the Toilet? N   In the past six months, have you accidently leaked urine? N   Do you have problems with loss of bowel control? N   Managing your Medications? N   Managing your Finances? N   Housekeeping or managing your Housekeeping? N     Patient Care Team: Junie Spencer, FNP as PCP - General (Family Medicine) Michaelle Copas, MD as Referring Physician (Optometry) Rehman, Joline Maxcy, MD (Inactive) as Consulting Physician (Gastroenterology) Renaldo Fiddler, MD as Consulting Physician (Pain Medicine) Meyran, Tiana Loft, NP as Nurse Practitioner (Neurosurgery) Donalee Citrin, MD as Consulting Physician (Neurosurgery)  Indicate any recent Medical Services you may have received from other than [facility] providers in the past year (date may be approximate).     Assessment:   This is a routine wellness examination for Tundra.  Hearing/Vision screen Hearing Screening - Comments:: Patient denies any hearing difficulties.   Vision Screening - Comments:: Wears rx glasses - up to date with routine eye exams     Goals Addressed             This Visit's Progress    Patient Stated       Remain active, independent, and healthy       Depression Screen    04/03/2023     9:11 AM 01/04/2023    9:06 AM 01/04/2023    8:44 AM 01/04/2023    8:40 AM 07/10/2022  11:09 AM 01/09/2022   11:50 AM 01/04/2022    8:29 AM  PHQ 2/9 Scores  PHQ - 2 Score 0 0 0 0 0 0 0  PHQ- 9 Score 0  6  1      Fall Risk    04/03/2023    9:16 AM 07/10/2022   11:09 AM 01/09/2022   11:49 AM 01/04/2022    8:29 AM 07/07/2021    8:21 AM  Fall Risk   Falls in the past year? 0 0 0 0 0  Number falls in past yr: 0      Injury with Fall? 0      Risk for fall due to : No Fall Risks      Follow up Falls prevention discussed  Falls evaluation completed      MEDICARE RISK AT HOME: Medicare Risk at Home Any stairs in or around the home?: Yes (pt states there are steps, but she doesn't use them. If she has to go to the basement she walks to it outside and doesnt use steps) If so, are there any without handrails?: No Home free of loose throw rugs in walkways, pet beds, electrical cords, etc?: Yes Adequate lighting in your home to reduce risk of falls?: Yes Life alert?: No Use of a cane, walker or w/c?: Yes Grab bars in the bathroom?: No Shower chair or bench in shower?: No Elevated toilet seat or a handicapped toilet?: Yes  TIMED UP AND GO:  Was the test performed?  No    Cognitive Function:    12/31/2017   11:14 AM 02/28/2015    2:06 PM  MMSE - Mini Mental State Exam  Orientation to time 5 5  Orientation to Place 5 5  Registration 3 3  Attention/ Calculation 5 5  Recall 3 3  Language- name 2 objects 2 2  Language- repeat 1 1  Language- follow 3 step command 3 3  Language- read & follow direction 1 1  Write a sentence 1 1  Copy design 1 1  Total score 30 30        04/03/2023    9:07 AM 01/09/2022   11:47 AM 01/06/2021   10:27 AM 01/06/2020   10:46 AM 01/05/2019   10:21 AM  6CIT Screen  What Year? 0 points 0 points 0 points 0 points 0 points  What month? 0 points 0 points 0 points 0 points 0 points  What time? 0 points 0 points 0 points 0 points 0 points  Count back from 20  0 points 0 points 0 points 0 points 0 points  Months in reverse 0 points 0 points 2 points 0 points 2 points  Repeat phrase 0 points 2 points 0 points 0 points 0 points  Total Score 0 points 2 points 2 points 0 points 2 points    Immunizations Immunization History  Administered Date(s) Administered   Pneumococcal Conjugate-13 09/03/2014   Pneumococcal Polysaccharide-23 08/30/2012   Tdap 01/01/2012, 01/04/2022   Zoster Recombinant(Shingrix) 07/07/2021, 01/04/2022   Zoster, Live 01/01/2012    TDAP status: Up to date  Flu Vaccine status: Declined, Education has been provided regarding the importance of this vaccine but patient still declined. Advised may receive this vaccine at local pharmacy or Health Dept. Aware to provide a copy of the vaccination record if obtained from local pharmacy or Health Dept. Verbalized acceptance and understanding.  Pneumococcal vaccine status: Up to date  Covid-19 vaccine status: Declined, Education has been provided regarding the importance  of this vaccine but patient still declined. Advised may receive this vaccine at local pharmacy or Health Dept.or vaccine clinic. Aware to provide a copy of the vaccination record if obtained from local pharmacy or Health Dept. Verbalized acceptance and understanding.  Qualifies for Shingles Vaccine? Yes   Zostavax completed Yes   Shingrix Completed?: Yes  Screening Tests Health Maintenance  Topic Date Due   DEXA SCAN  01/27/2022   Medicare Annual Wellness (AWV)  01/10/2023   COVID-19 Vaccine (1 - 2023-24 season) Never done   INFLUENZA VACCINE  08/12/2023 (Originally 12/13/2022)   MAMMOGRAM  04/19/2023   Colonoscopy  01/20/2025   DTaP/Tdap/Td (3 - Td or Tdap) 01/05/2032   Pneumonia Vaccine 36+ Years old  Completed   Hepatitis C Screening  Completed   Zoster Vaccines- Shingrix  Completed   HPV VACCINES  Aged Out    Health Maintenance  Health Maintenance Due  Topic Date Due   DEXA SCAN  01/27/2022   Medicare  Annual Wellness (AWV)  01/10/2023   COVID-19 Vaccine (1 - 2023-24 season) Never done    Colorectal cancer screening: Type of screening: Colonoscopy. Completed 01/21/2020. Repeat every 5 years  Mammogram Status: Scheduled for  04/22/2023 with the mammogram bus  Bone Density status: Ordered 04/03/2023. Pt provided with contact info and advised to call to schedule appt.  Lung Cancer Screening: (Low Dose CT Chest recommended if Age 4-80 years, 20 pack-year currently smoking OR have quit w/in 15years.) does not qualify.   Lung Cancer Screening Referral: na  Additional Screening:  Hepatitis C Screening: does not qualify; Completed 03/31/2015  Vision Screening: Recommended annual ophthalmology exams for early detection of glaucoma and other disorders of the eye. Is the patient up to date with their annual eye exam?  Yes  Who is the provider or what is the name of the office in which the patient attends annual eye exams? [facility] [place] If pt is not established with a provider, would they like to be referred to a provider to establish care? No .   Dental Screening: Recommended annual dental exams for proper oral hygiene  Diabetic Foot Exam: na  Community Resource Referral / Chronic Care Management: CRR required this visit?  No   CCM required this visit?  No     Plan:     I have personally reviewed and noted the following in the patient's chart:   Medical and social history Use of alcohol, tobacco or illicit drugs  Current medications and supplements including opioid prescriptions. Patient is not currently taking opioid prescriptions. Functional ability and status Nutritional status Physical activity Advanced directives List of other physicians Hospitalizations, surgeries, and ER visits in previous 12 months Vitals Screenings to include cognitive, depression, and falls Referrals and appointments  In addition, I have reviewed and discussed with patient certain preventive  protocols, quality metrics, and best practice recommendations. A written personalized care plan for preventive services as well as general preventive health recommendations were provided to patient.     Jordan Hawks Ciro Tashiro, CMA   04/03/2023   After Visit Summary: (Mail) Due to this being a telephonic visit, the after visit summary with patients personalized plan was offered to patient via mail   Nurse Notes: see routing comment

## 2023-04-03 NOTE — Patient Instructions (Addendum)
Cheryl Lee , Thank you for taking time to come for your Medicare Wellness Visit. I appreciate your ongoing commitment to your health goals. Please review the following plan we discussed and let me know if I can assist you in the future.   Referrals/Orders/Follow-Ups/Clinician Recommendations:  Next Medicare Annual Wellness Visit: April 03, 2024 at 8:00 am virtual  You have an order for:  []   2D Mammogram  []   3D Mammogram  [x]   Bone Density   []   Lung Cancer Screening  Please call for appointment:   [facility] [address] [place], [place] [address] Phone: [phone]  Make sure to wear two-piece clothing.  No lotions powders or deodorants the day of the appointment Make sure to bring picture ID and insurance card.  Bring list of medications you are currently taking including any supplements.   Schedule your [place] screening mammogram through MyChart!   Log into your MyChart account.  Go to 'Visit' (or 'Appointments' if on mobile App) --> Schedule an Appointment  Under 'Select a Reason for Visit' choose the Mammogram Screening option.  Complete the pre-visit questions and select the time and place that best fits your schedule.    This is a list of the screening recommended for you and due dates:  Health Maintenance  Topic Date Due   DEXA scan (bone density measurement)  01/27/2022   COVID-19 Vaccine (1 - 2023-24 season) Never done   Flu Shot  08/12/2023*   Mammogram  04/19/2023   Medicare Annual Wellness Visit  04/02/2024   Colon Cancer Screening  01/20/2025   DTaP/Tdap/Td vaccine (3 - Td or Tdap) 01/05/2032   Pneumonia Vaccine  Completed   Hepatitis C Screening  Completed   Zoster (Shingles) Vaccine  Completed   HPV Vaccine  Aged Out  *Topic was postponed. The date shown is not the original due date.    Advanced directives: (ACP Link)Information on Advanced Care Planning can be found at [facility] Advance  Health Care Directives Advance Health Care Directives ([email or web address]   Next Medicare Annual Wellness Visit scheduled for next year: Yes  Preventive Care 65 Years and Older, Female Preventive care refers to lifestyle choices and visits with your health care provider that can promote health and wellness. Preventive care visits are also called wellness exams. What can I expect for my preventive care visit? Counseling Your health care provider may ask you questions about your: Medical history, including: Past medical problems. Family medical history. Pregnancy and menstrual history. History of falls. Current health, including: Memory and ability to understand (cognition). Emotional well-being. Home life and relationship well-being. Sexual activity and sexual health. Lifestyle, including: Alcohol, nicotine or tobacco, and drug use. Access to firearms. Diet, exercise, and sleep habits. Work and work Astronomer. Sunscreen use. Safety issues such as seatbelt and bike helmet use. Physical exam Your health care provider will check your: Height and weight. These may be used to calculate your BMI (body mass index). BMI is a measurement that tells if you are at a healthy weight. Waist circumference. This measures the distance around your waistline. This measurement also tells if you are at a healthy weight and may help predict your risk of certain diseases, such as type 2 diabetes and high blood pressure. Heart rate and blood pressure. Body temperature. Skin for abnormal spots. What immunizations do I need?  Vaccines are usually given at various ages, according to a schedule. Your health care provider will recommend vaccines  for you based on your age, medical history, and lifestyle or other factors, such as travel or where you work. What tests do I need? Screening Your health care provider may recommend screening tests for certain conditions. This may include: Lipid and cholesterol  levels. Hepatitis C test. Hepatitis B test. HIV (human immunodeficiency virus) test. STI (sexually transmitted infection) testing, if you are at risk. Lung cancer screening. Colorectal cancer screening. Diabetes screening. This is done by checking your blood sugar (glucose) after you have not eaten for a while (fasting). Mammogram. Talk with your health care provider about how often you should have regular mammograms. BRCA-related cancer screening. This may be done if you have a family history of breast, ovarian, tubal, or peritoneal cancers. Bone density scan. This is done to screen for osteoporosis. Talk with your health care provider about your test results, treatment options, and if necessary, the need for more tests. Follow these instructions at home: Eating and drinking  Eat a diet that includes fresh fruits and vegetables, whole grains, lean protein, and low-fat dairy products. Limit your intake of foods with high amounts of sugar, saturated fats, and salt. Take vitamin and mineral supplements as recommended by your health care provider. Do not drink alcohol if your health care provider tells you not to drink. If you drink alcohol: Limit how much you have to 0-1 drink a day. Know how much alcohol is in your drink. In the [place] one drink equals one 12 oz bottle of beer (355 mL), one 5 oz glass of wine (148 mL), or one 1 oz glass of hard liquor (44 mL). Lifestyle Brush your teeth every morning and night with fluoride toothpaste. Floss one time each day. Exercise for at least 30 minutes 5 or more days each week. Do not use any products that contain nicotine or tobacco. These products include cigarettes, chewing tobacco, and vaping devices, such as e-cigarettes. If you need help quitting, ask your health care provider. Do not use drugs. If you are sexually active, practice safe sex. Use a condom or other form of protection in order to prevent STIs. Take aspirin only as told by your  health care provider. Make sure that you understand how much to take and what form to take. Work with your health care provider to find out whether it is safe and beneficial for you to take aspirin daily. Ask your health care provider if you need to take a cholesterol-lowering medicine (statin). Find healthy ways to manage stress, such as: Meditation, yoga, or listening to music. Journaling. Talking to a trusted person. Spending time with friends and family. Minimize exposure to UV radiation to reduce your risk of skin cancer. Safety Always wear your seat belt while driving or riding in a vehicle. Do not drive: If you have been drinking alcohol. Do not ride with someone who has been drinking. When you are tired or distracted. While texting. If you have been using any mind-altering substances or drugs. Wear a helmet and other protective equipment during sports activities. If you have firearms in your house, make sure you follow all gun safety procedures. What's next? Visit your health care provider once a year for an annual wellness visit. Ask your health care provider how often you should have your eyes and teeth checked. Stay up to date on all vaccines. This information is not intended to replace advice given to you by your health care provider. Make sure you discuss any questions you have with your health care provider.  Document Revised: 10/26/2020 Document Reviewed: 10/26/2020 Elsevier Patient Education  2024 [employer]. Understanding Your Risk for Falls Millions of people have serious injuries from falls each year. It is important to understand your risk of falling. Talk with your health care provider about your risk and what you can do to lower it. If you do have a serious fall, make sure to tell your provider. Falling once raises your risk of falling again. How can falls affect me? Serious injuries from falls are common. These include: Broken bones, such as hip fractures. Head  injuries, such as traumatic brain injuries (TBI) or concussions. A fear of falling can cause you to avoid activities and stay at home. This can make your muscles weaker and raise your risk for a fall. What can increase my risk? There are a number of risk factors that increase your risk for falling. The more risk factors you have, the higher your risk of falling. Serious injuries from a fall happen most often to people who are older than 77 years old. Teenagers and young adults ages 63-29 are also at higher risk. Common risk factors include: Weakness in the lower body. Being generally weak or confused due to long-term (chronic) illness. Dizziness or balance problems. Poor vision. Medicines that cause dizziness or drowsiness. These may include: Medicines for your blood pressure, heart, anxiety, insomnia, or swelling (edema). Pain medicines. Muscle relaxants. Other risk factors include: Drinking alcohol. Having had a fall in the past. Having foot pain or wearing improper footwear. Working at a dangerous job. Having any of the following in your home: Tripping hazards, such as floor clutter or loose rugs. Poor lighting. Pets. Having dementia or memory loss. What actions can I take to lower my risk of falling?     Physical activity Stay physically fit. Do strength and balance exercises. Consider taking a regular class to build strength and balance. Yoga and tai chi are good options. Vision Have your eyes checked every year and your prescription for glasses or contacts updated as needed. Shoes and walking aids Wear non-skid shoes. Wear shoes that have rubber soles and low heels. Do not wear high heels. Do not walk around the house in socks or slippers. Use a cane or walker as told by your provider. Home safety Attach secure railings on both sides of your stairs. Install grab bars for your bathtub, shower, and toilet. Use a non-skid mat in your bathtub or shower. Attach bath mats  securely with double-sided, non-slip rug tape. Use good lighting in all rooms. Keep a flashlight near your bed. Make sure there is a clear path from your bed to the bathroom. Use night-lights. Do not use throw rugs. Make sure all carpeting is taped or tacked down securely. Remove all clutter from walkways and stairways, including extension cords. Repair uneven or broken steps and floors. Avoid walking on icy or slippery surfaces. Walk on the grass instead of on icy or slick sidewalks. Use ice melter to get rid of ice on walkways in the winter. Use a cordless phone. Questions to ask your health care provider Can you help me check my risk for a fall? Do any of my medicines make me more likely to fall? Should I take a vitamin D supplement? What exercises can I do to improve my strength and balance? Should I make an appointment to have my vision checked? Do I need a bone density test to check for weak bones (osteoporosis)? Would it help to use a cane or a  walker? Where to find more information Centers for Disease Control and Prevention, STEADI: TonerPromos.no Community-Based Fall Prevention Programs: TonerPromos.no [employer] on Aging: BaseRingTones.pl Contact a health care provider if: You fall at home. You are afraid of falling at home. You feel weak, drowsy, or dizzy. This information is not intended to replace advice given to you by your health care provider. Make sure you discuss any questions you have with your health care provider. Document Revised: 01/01/2022 Document Reviewed: 01/01/2022 Elsevier Patient Education  2024 [employer].

## 2023-04-08 ENCOUNTER — Telehealth: Payer: Self-pay | Admitting: Family

## 2023-04-08 NOTE — Telephone Encounter (Unsigned)
Copied from CRM [id number]. Topic: Appointments - Scheduling Inquiry for Clinic >> Apr 08, 2023  1:17 PM Herbert Seta B wrote: Reason for CRM: Patient called states needing to schedule Bone Density testing before 12/6. Please contact.

## 2023-04-10 DIAGNOSIS — M5416 Radiculopathy, lumbar region: Secondary | ICD-10-CM | POA: Diagnosis not present

## 2023-04-17 ENCOUNTER — Other Ambulatory Visit: Payer: Self-pay | Admitting: Family

## 2023-04-17 DIAGNOSIS — Z1231 Encounter for screening mammogram for malignant neoplasm of breast: Secondary | ICD-10-CM

## 2023-04-22 ENCOUNTER — Ambulatory Visit
Admission: RE | Admit: 2023-04-22 | Discharge: 2023-04-22 | Disposition: A | Discharge status: Home / Self Care | Payer: Medicare Other | Source: Ambulatory Visit | Attending: Family | Admitting: Family

## 2023-04-22 DIAGNOSIS — Z1231 Encounter for screening mammogram for malignant neoplasm of breast: Secondary | ICD-10-CM

## 2023-04-30 DIAGNOSIS — M5416 Radiculopathy, lumbar region: Secondary | ICD-10-CM | POA: Diagnosis not present

## 2023-05-06 ENCOUNTER — Ambulatory Visit (INDEPENDENT_AMBULATORY_CARE_PROVIDER_SITE_OTHER): Payer: Medicare Other | Admitting: Family

## 2023-05-06 ENCOUNTER — Other Ambulatory Visit: Payer: Medicare Other

## 2023-05-06 ENCOUNTER — Telehealth: Payer: Self-pay

## 2023-05-06 ENCOUNTER — Encounter: Payer: Self-pay | Admitting: Family

## 2023-05-06 VITALS — BP 137/75 | HR 100 | Temp 97.1°F | Ht 61.0 in | Wt 134.0 lb

## 2023-05-06 DIAGNOSIS — N1831 Chronic kidney disease, stage 3a: Secondary | ICD-10-CM | POA: Diagnosis not present

## 2023-05-06 DIAGNOSIS — E663 Overweight: Secondary | ICD-10-CM | POA: Diagnosis not present

## 2023-05-06 DIAGNOSIS — E559 Vitamin D deficiency, unspecified: Secondary | ICD-10-CM

## 2023-05-06 DIAGNOSIS — K219 Gastro-esophageal reflux disease without esophagitis: Secondary | ICD-10-CM | POA: Diagnosis not present

## 2023-05-06 DIAGNOSIS — I10 Essential (primary) hypertension: Secondary | ICD-10-CM | POA: Diagnosis not present

## 2023-05-06 DIAGNOSIS — M5136 Other intervertebral disc degeneration, lumbar region with discogenic back pain only: Secondary | ICD-10-CM | POA: Diagnosis not present

## 2023-05-06 DIAGNOSIS — E785 Hyperlipidemia, unspecified: Secondary | ICD-10-CM | POA: Diagnosis not present

## 2023-05-06 DIAGNOSIS — M810 Age-related osteoporosis without current pathological fracture: Secondary | ICD-10-CM | POA: Diagnosis not present

## 2023-05-06 MED ORDER — ATORVASTATIN CALCIUM 10 MG PO TABS
10.0000 mg | ORAL_TABLET | Freq: Every day | ORAL | 3 refills | Status: DC
Start: 2023-05-06 — End: 2024-03-24

## 2023-05-06 MED ORDER — OMEPRAZOLE 20 MG PO CPDR: 20.0000 mg | DELAYED_RELEASE_CAPSULE | Freq: Every day | ORAL | 3 refills | Status: DC

## 2023-05-06 MED ORDER — LISINOPRIL-HYDROCHLOROTHIAZIDE 20-12.5 MG PO TABS: 0.5000 | ORAL_TABLET | Freq: Every day | ORAL | 3 refills | Status: DC

## 2023-05-06 NOTE — Telephone Encounter (Signed)
Copied from CRM [id number]. Topic: Clinical - Medical Advice >> May 06, 2023 10:13 AM Carlatta H wrote: Reason for CRM: Patient had a appointment today and she did not have a bone density test done because the machine was not working//Please call patient to advise if she still needs to have test done//

## 2023-05-06 NOTE — Progress Notes (Signed)
Subjective:    Patient ID: Cheryl Lee, female    DOB: April 09, 1946, 77 y.o.   MRN: [id number]  Chief Complaint  Patient presents with   Medical Management of Chronic Issues    4 month follow up    Pt presents to the office today for chronic follow up.  She has a hx of closed compression fracture and bulging lumbar disc. She has a wedge compression fracture of L4 with sciatica pain.She continues to have intermittent aching pain of 4-5 out 10 when walking for a long distance down left leg, but 2 out 10 when sitting.  She completed PT and at this time does not want surgery.   She had a cholecystectomy on 08/23/22. Doing well.    She has osteoporosis and takes Prolia. Last dexa scan 01/27/20. Hypertension This is a chronic problem. The current episode started more than 1 year ago. The problem has been resolved since onset. The problem is controlled. Associated symptoms include malaise/fatigue, peripheral edema (slightly in left leg) and shortness of breath ("after walking"). Risk factors for coronary artery disease include dyslipidemia and sedentary lifestyle. The current treatment provides moderate improvement.  Gastroesophageal Reflux She complains of belching and heartburn. She reports no abdominal pain. This is a chronic problem. The current episode started more than 1 year ago. The problem occurs occasionally. She has tried a PPI for the symptoms. The treatment provided moderate relief.  Hyperlipidemia This is a chronic problem. The current episode started more than 1 year ago. She has no history of obesity. Associated symptoms include shortness of breath ("after walking"). Current antihyperlipidemic treatment includes statins. The current treatment provides moderate improvement of lipids. Risk factors for coronary artery disease include dyslipidemia, hypertension, a sedentary lifestyle and post-menopausal.      Review of Systems  Constitutional:  Positive for malaise/fatigue.   Respiratory:  Positive for shortness of breath ("after walking").   Gastrointestinal:  Positive for heartburn. Negative for abdominal pain.  All other systems reviewed and are negative.      Objective:   Physical Exam Vitals reviewed.  Constitutional:      General: She is not in acute distress.    Appearance: She is well-developed. She is obese.  HENT:     Head: Normocephalic and atraumatic.     Right Ear: Tympanic membrane normal.     Left Ear: Tympanic membrane normal.  Eyes:     Pupils: Pupils are equal, round, and reactive to light.  Neck:     Thyroid: No thyromegaly.  Cardiovascular:     Rate and Rhythm: Normal rate and regular rhythm.     Heart sounds: Normal heart sounds. No murmur heard. Pulmonary:     Effort: Pulmonary effort is normal. No respiratory distress.     Breath sounds: Normal breath sounds. No wheezing.  Abdominal:     General: Bowel sounds are normal. There is no distension.     Palpations: Abdomen is soft.     Tenderness: There is no abdominal tenderness.  Musculoskeletal:        General: Tenderness present.     Cervical back: Normal range of motion and neck supple.     Comments: Pain in lumbar with standing and flexion, using cane   Skin:    General: Skin is warm and dry.  Neurological:     Mental Status: She is alert and oriented to person, place, and time.     Cranial Nerves: No cranial nerve deficit.     Motor:  Weakness present.     Gait: Gait abnormal.     Deep Tendon Reflexes: Reflexes are normal and symmetric.  Psychiatric:        Behavior: Behavior normal.        Thought Content: Thought content normal.        Judgment: Judgment normal.       BP 137/75   Pulse 100   Temp (!) 97.1 F (36.2 C)   Ht 5\' 1"  (1.549 m)   Wt 134 lb (60.8 kg)   LMP 08/05/1991   SpO2 98%   BMI 25.32 kg/m      Assessment & Plan:   Cheryl Lee comes in today with chief complaint of Medical Management of Chronic Issues (4 month follow up  )   Diagnosis and orders addressed:  1. Primary hypertension (Primary) - Comprehensive metabolic panel - lisinopril-hydrochlorothiazide (ZESTORETIC) 20-12.5 MG tablet; Take 0.5 tablets by mouth daily.  Dispense: 45 tablet; Refill: 3  2. Gastroesophageal reflux disease without esophagitis - Comprehensive metabolic panel - omeprazole (PRILOSEC) 20 MG capsule; Take 1 capsule (20 mg total) by mouth daily.  Dispense: 90 capsule; Refill: 3  3. Hyperlipidemia with target LDL less than 100 - Comprehensive metabolic panel - Lipid panel - atorvastatin (LIPITOR) 10 MG tablet; Take 1 tablet (10 mg total) by mouth daily.  Dispense: 90 tablet; Refill: 3  4. Age-related osteoporosis without current pathological fracture - Comprehensive metabolic panel  5. Overweight (BMI 25.0-29.9) - Comprehensive metabolic panel  6. Vitamin D deficiency - Comprehensive metabolic panel - VITAMIN D 25 Hydroxy (Vit-D Deficiency, Fractures)  7. Degeneration of intervertebral disc of lumbar region with discogenic back pain - Comprehensive metabolic panel  8. Stage 3a chronic kidney disease (CKD) (HCC) - Comprehensive metabolic panel    Labs pending Continue current medications  Keep follow up with specialists  Health Maintenance reviewed Diet and exercise encouraged  Follow up plan: 4 months   Jannifer Rodney, FNP

## 2023-05-06 NOTE — Patient Instructions (Signed)

## 2023-05-07 LAB — COMPREHENSIVE METABOLIC PANEL
ALT: 12 [IU]/L (ref 0–32)
AST: 16 [IU]/L (ref 0–40)
Albumin: 4.5 g/dL (ref 3.8–4.8)
Alkaline Phosphatase: 62 [IU]/L (ref 44–121)
BUN/Creatinine Ratio: 19 (ref 12–28)
BUN: 21 mg/dL (ref 8–27)
Bilirubin Total: 0.4 mg/dL (ref 0.0–1.2)
CO2: 21 mmol/L (ref 20–29)
Calcium: 9.7 mg/dL (ref 8.7–10.3)
Chloride: 106 mmol/L (ref 96–106)
Creatinine, Ser: 1.11 mg/dL — ABNORMAL HIGH (ref 0.57–1.00)
Globulin, Total: 2.3 g/dL (ref 1.5–4.5)
Glucose: 99 mg/dL (ref 70–99)
Potassium: 4.8 mmol/L (ref 3.5–5.2)
Sodium: 143 mmol/L (ref 134–144)
Total Protein: 6.8 g/dL (ref 6.0–8.5)
eGFR: 51 mL/min/{1.73_m2} — ABNORMAL LOW (ref >59)

## 2023-05-07 LAB — LIPID PANEL
Chol/HDL Ratio: 2.5 {ratio} (ref 0.0–4.4)
Cholesterol, Total: 159 mg/dL (ref 100–199)
HDL: 63 mg/dL (ref >39)
LDL Chol Calc (NIH): 66 mg/dL (ref 0–99)
Triglycerides: 181 mg/dL — ABNORMAL HIGH (ref 0–149)
VLDL Cholesterol Cal: 30 mg/dL (ref 5–40)

## 2023-05-07 LAB — VITAMIN D 25 HYDROXY (VIT D DEFICIENCY, FRACTURES): Vit D, 25-Hydroxy: 42.7 ng/mL (ref 30.0–100.0)

## 2023-05-17 NOTE — Telephone Encounter (Signed)
 Patient declines at the moment due to possible weather - I will cal back back in 2-3 wks and see if she wants to come then or keep appt following her appt with [facility] in April 2025

## 2023-05-20 ENCOUNTER — Other Ambulatory Visit: Payer: Self-pay | Admitting: Family

## 2023-05-20 DIAGNOSIS — E785 Hyperlipidemia, unspecified: Secondary | ICD-10-CM

## 2023-05-20 DIAGNOSIS — K219 Gastro-esophageal reflux disease without esophagitis: Secondary | ICD-10-CM

## 2023-05-20 DIAGNOSIS — I10 Essential (primary) hypertension: Secondary | ICD-10-CM

## 2023-06-24 ENCOUNTER — Ambulatory Visit (INDEPENDENT_AMBULATORY_CARE_PROVIDER_SITE_OTHER): Payer: Medicare Other

## 2023-06-24 ENCOUNTER — Encounter: Payer: Self-pay | Admitting: Family

## 2023-06-24 ENCOUNTER — Ambulatory Visit (INDEPENDENT_AMBULATORY_CARE_PROVIDER_SITE_OTHER): Payer: Medicare Other | Admitting: Family

## 2023-06-24 VITALS — BP 140/70 | HR 77 | Temp 97.1°F | Ht 61.0 in | Wt 138.0 lb

## 2023-06-24 DIAGNOSIS — M25552 Pain in left hip: Secondary | ICD-10-CM

## 2023-06-24 DIAGNOSIS — M5126 Other intervertebral disc displacement, lumbar region: Secondary | ICD-10-CM

## 2023-06-24 DIAGNOSIS — Z0000 Encounter for general adult medical examination without abnormal findings: Secondary | ICD-10-CM

## 2023-06-24 DIAGNOSIS — M5416 Radiculopathy, lumbar region: Secondary | ICD-10-CM | POA: Diagnosis not present

## 2023-06-24 DIAGNOSIS — Z780 Asymptomatic menopausal state: Secondary | ICD-10-CM

## 2023-06-24 DIAGNOSIS — S32020S Wedge compression fracture of second lumbar vertebra, sequela: Secondary | ICD-10-CM | POA: Diagnosis not present

## 2023-06-24 DIAGNOSIS — M1612 Unilateral primary osteoarthritis, left hip: Secondary | ICD-10-CM | POA: Diagnosis not present

## 2023-06-24 DIAGNOSIS — M51369 Other intervertebral disc degeneration, lumbar region without mention of lumbar back pain or lower extremity pain: Secondary | ICD-10-CM | POA: Diagnosis not present

## 2023-06-24 NOTE — Progress Notes (Signed)
 Subjective:    Patient ID: Cheryl Lee, female    DOB: 1945-10-12, 78 y.o.   MRN: [id number]  Chief Complaint  Patient presents with   Back Pain   Hip Pain    On left side wants MRI   Pt presents to the office today with lower back, left hip, and left leg pain that started 2018. She is followed by Neurosurgery. She got injection without relief.  She completed PT.  Back Pain This is a chronic problem. The current episode started more than 1 year ago. The problem occurs intermittently. The problem has been gradually worsening since onset. The pain is present in the lumbar spine. The quality of the pain is described as aching. The pain is at a severity of 5/10 (0 when sitting, 2 when laying down, and 5-7 when walking around). The pain is moderate. The symptoms are aggravated by standing. Associated symptoms include leg pain and tingling. Risk factors include history of osteoporosis. The treatment provided mild relief.  Hip Pain  Associated symptoms include tingling.      Review of Systems  Musculoskeletal:  Positive for back pain.  Neurological:  Positive for tingling.  All other systems reviewed and are negative.   Social History   Socioeconomic History   Marital status: Divorced    Spouse name: Not on file   Number of children: 1   Years of education: 12   Highest education level: 12th grade  Occupational History   Occupation: retired  Tobacco Use   Smoking status: Never   Smokeless tobacco: Never  Vaping Use   Vaping status: Never Used  Substance and Sexual Activity   Alcohol  use: No   Drug use: No   Sexual activity: Not Currently  Other Topics Concern   Not on file  Social History Narrative   Lives alone. Daughter lives nearby   Brother lives next door.   Social Drivers of Corporate investment banker Strain: Low Risk  (04/03/2023)   Overall Financial Resource Strain (CARDIA)    Difficulty of Paying Living Expenses: Not hard at all  Food Insecurity: No  Food Insecurity (04/03/2023)   Hunger Vital Sign    Worried About Running Out of Food in the Last Year: Never true    Ran Out of Food in the Last Year: Never true  Transportation Needs: No Transportation Needs (04/03/2023)   PRAPARE - Administrator, Civil Service (Medical): No    Lack of Transportation (Non-Medical): No  Physical Activity: Insufficiently Active (04/03/2023)   Exercise Vital Sign    Days of Exercise per Week: 3 days    Minutes of Exercise per Session: 20 min  Stress: No Stress Concern Present (04/03/2023)   [employer] of Occupational Health - Occupational Stress Questionnaire    Feeling of Stress : Not at all  Social Connections: Moderately Isolated (04/03/2023)   Social Connection and Isolation Panel [NHANES]    Frequency of Communication with Friends and Family: More than three times a week    Frequency of Social Gatherings with Friends and Family: More than three times a week    Attends Religious Services: More than 4 times per year    Active Member of [employer] or Organizations: No    Attends Banker Meetings: Never    Marital Status: Divorced   Family History  Problem Relation Age of Onset   Cancer Mother    Alzheimer's disease Mother    Heart disease Mother  Early death Father    Diabetes Sister    Diabetes Sister    Cancer Sister 23       uterine   Hypertension Sister    Healthy Daughter    Stroke Brother    Breast cancer Neg Hx         Objective:   Physical Exam Vitals reviewed.  Constitutional:      General: She is not in acute distress.    Appearance: She is well-developed.  HENT:     Head: Normocephalic and atraumatic.     Right Ear: Tympanic membrane normal.     Left Ear: Tympanic membrane normal.  Eyes:     Pupils: Pupils are equal, round, and reactive to light.  Neck:     Thyroid : No thyromegaly.  Cardiovascular:     Rate and Rhythm: Normal rate and regular rhythm.     Heart sounds: Normal heart  sounds. No murmur heard. Pulmonary:     Effort: Pulmonary effort is normal. No respiratory distress.     Breath sounds: Normal breath sounds. No wheezing.  Abdominal:     General: Bowel sounds are normal. There is no distension.     Palpations: Abdomen is soft.     Tenderness: There is no abdominal tenderness.  Musculoskeletal:        General: Tenderness present.     Cervical back: Normal range of motion and neck supple.     Comments: Pain in lumbar with flexion and standing, pain down left leg when standing, positive SLR   Skin:    General: Skin is warm and dry.  Neurological:     Mental Status: She is alert and oriented to person, place, and time.     Cranial Nerves: No cranial nerve deficit.     Deep Tendon Reflexes: Reflexes are normal and symmetric.  Psychiatric:        Behavior: Behavior normal.        Thought Content: Thought content normal.        Judgment: Judgment normal.       BP (!) 140/70   Pulse 77   Temp (!) 97.1 F (36.2 C) (Temporal)   Ht 5\' 1"  (1.549 m)   Wt 138 lb (62.6 kg)   LMP 08/05/1991   SpO2 94%   BMI 26.07 kg/m      Assessment & Plan:  Cheryl Lee comes in today with chief complaint of Back Pain and Hip Pain (On left side wants MRI)   Diagnosis and orders addressed:  1. HNP (herniated nucleus pulposus), lumbar (Primary)  2. Compression fracture of L2 vertebra, sequela  3. Lumbar nerve root impingement  4. Left hip pain  - DG HIP UNILAT W OR W/O PELVIS 2-3 VIEWS LEFT; Future   Recommend follow up with [facility] to discuss options  X-ray pending  ROM exercises discussed and given  Continue current medications  Keep follow up with specialists  Health Maintenance reviewed Diet and exercise encouraged  No follow-ups on file.    Tommas Fragmin, FNP

## 2023-06-24 NOTE — Patient Instructions (Signed)

## 2023-06-27 DIAGNOSIS — Z780 Asymptomatic menopausal state: Secondary | ICD-10-CM | POA: Diagnosis not present

## 2023-06-27 DIAGNOSIS — M810 Age-related osteoporosis without current pathological fracture: Secondary | ICD-10-CM | POA: Diagnosis not present

## 2023-06-28 ENCOUNTER — Telehealth: Payer: Self-pay

## 2023-06-28 DIAGNOSIS — M51369 Other intervertebral disc degeneration, lumbar region without mention of lumbar back pain or lower extremity pain: Secondary | ICD-10-CM

## 2023-06-28 NOTE — Telephone Encounter (Signed)
Reviewed dexa results with pt per providers notes. Pt voiced understanding. She says her insurance does not cover the Prolia so she is unable to get it unless PCP knows another way or has another option.

## 2023-06-28 NOTE — Telephone Encounter (Signed)
Copied from CRM [id number]. Topic: Clinical - Lab/Test Results >> Jun 28, 2023  2:29 PM Cheryl Lee wrote: Reason for CRM: Patient asking about results of xray of the hips, also Prolia not covered by insurance, please call patient [phone]

## 2023-07-01 NOTE — Telephone Encounter (Signed)
Melissa can you check on the result of this x-ray? Thanks

## 2023-07-02 NOTE — Telephone Encounter (Signed)
I have called [facility] to get a read on the hip xray.

## 2023-07-03 NOTE — Telephone Encounter (Signed)
Report os bacl please review and advise

## 2023-07-03 NOTE — Telephone Encounter (Signed)
Please see patient message that shows that she wants a referral to [facility] and go ahead and place a referral to [facility] for degenerative disc disease lumbar

## 2023-07-03 NOTE — Addendum Note (Signed)
Addended by: Dorene Sorrow on: 07/03/2023 11:40 AM   Modules accepted: Orders

## 2023-07-03 NOTE — Telephone Encounter (Signed)
Referral placed to [facility]

## 2023-07-09 ENCOUNTER — Telehealth: Payer: Self-pay

## 2023-07-09 DIAGNOSIS — M810 Age-related osteoporosis without current pathological fracture: Secondary | ICD-10-CM

## 2023-07-09 MED ORDER — DENOSUMAB 60 MG/ML ~~LOC~~ SOSY
60.0000 mg | PREFILLED_SYRINGE | Freq: Once | SUBCUTANEOUS | Status: AC
Start: 2023-07-23 — End: 2023-07-30
  Administered 2023-07-30: 60 mg via SUBCUTANEOUS

## 2023-07-09 NOTE — Telephone Encounter (Signed)
 Prolia ordered for PA team to review verification benefits

## 2023-07-10 ENCOUNTER — Telehealth: Payer: Self-pay

## 2023-07-10 NOTE — Telephone Encounter (Signed)
 Prolia VOB initiated via [email or web address]  Next Prolia inj DUE: 07/29/23

## 2023-07-11 ENCOUNTER — Other Ambulatory Visit (HOSPITAL_COMMUNITY): Payer: Self-pay

## 2023-07-11 NOTE — Telephone Encounter (Signed)
 Cheryl Lee

## 2023-07-11 NOTE — Telephone Encounter (Signed)
 Pt ready for scheduling for PROLIA on or after : 07/29/23  Option# 1: Buy/Bill (Office supplied medication)  Out-of-pocket cost due at time of clinic visit: $0  Number of injection/visits approved: ---  Primary: MEDICARE Prolia co-insurance: 0% Admin fee co-insurance: 0%  Secondary: BCBS[place]-MED[place] Prolia co-insurance:  covers the Medicare Part B deductible, co-insurance and 100% of the excess charges Admin fee co-insurance:   Medical Benefit Details: Date Benefits were checked: 07/10/23 Deductible: $134.52 Met of $257 Required/ Coinsurance: 0%/ Admin Fee: 0%  Prior Auth: N/A PA# Expiration Date:   # of doses approved: ----------------------------------------------------------------------- Option# 2- Med Obtained from pharmacy:  Pharmacy benefit: Copay $1013.19 (Paid to pharmacy) Admin Fee: 0% (Pay at clinic)  Prior Auth: N/A PA# Expiration Date:   # of doses approved:   If patient wants fill through the pharmacy benefit please send prescription to:  [facility] , and include estimated need by date in rx notes. Pharmacy will ship medication directly to the office.  Patient NOT eligible for Prolia Copay Card. Copay Card can make patient's cost as little as $25. Link to apply: [email or web address]://[email or web address]  ** This summary of benefits is an estimation of the patient's out-of-pocket cost. Exact cost may very based on individual plan coverage.

## 2023-07-12 ENCOUNTER — Other Ambulatory Visit: Payer: Self-pay | Admitting: Family Medicine

## 2023-07-12 DIAGNOSIS — M810 Age-related osteoporosis without current pathological fracture: Secondary | ICD-10-CM

## 2023-07-30 ENCOUNTER — Ambulatory Visit (INDEPENDENT_AMBULATORY_CARE_PROVIDER_SITE_OTHER): Admitting: *Deleted

## 2023-07-30 DIAGNOSIS — M810 Age-related osteoporosis without current pathological fracture: Secondary | ICD-10-CM | POA: Diagnosis not present

## 2023-07-30 NOTE — Progress Notes (Signed)
Prolia injection given to right upper arm.  Patient tolerated well. Buy & bill

## 2023-08-06 ENCOUNTER — Other Ambulatory Visit: Payer: Self-pay | Admitting: Family

## 2023-08-07 DIAGNOSIS — M545 Low back pain, unspecified: Secondary | ICD-10-CM | POA: Diagnosis not present

## 2023-08-29 DIAGNOSIS — M545 Low back pain, unspecified: Secondary | ICD-10-CM | POA: Diagnosis not present

## 2023-09-03 DIAGNOSIS — M545 Low back pain, unspecified: Secondary | ICD-10-CM | POA: Diagnosis not present

## 2023-09-05 ENCOUNTER — Other Ambulatory Visit: Payer: Medicare Other

## 2023-09-05 ENCOUNTER — Ambulatory Visit (INDEPENDENT_AMBULATORY_CARE_PROVIDER_SITE_OTHER): Payer: Medicare Other | Admitting: Family

## 2023-09-05 ENCOUNTER — Encounter: Payer: Self-pay | Admitting: Family

## 2023-09-05 ENCOUNTER — Other Ambulatory Visit: Payer: Self-pay | Admitting: Family

## 2023-09-05 VITALS — BP 98/65 | HR 100 | Temp 97.6°F | Ht 61.0 in | Wt 130.0 lb

## 2023-09-05 DIAGNOSIS — M25511 Pain in right shoulder: Secondary | ICD-10-CM

## 2023-09-05 DIAGNOSIS — M810 Age-related osteoporosis without current pathological fracture: Secondary | ICD-10-CM | POA: Diagnosis not present

## 2023-09-05 DIAGNOSIS — N1831 Chronic kidney disease, stage 3a: Secondary | ICD-10-CM | POA: Diagnosis not present

## 2023-09-05 DIAGNOSIS — I10 Essential (primary) hypertension: Secondary | ICD-10-CM | POA: Diagnosis not present

## 2023-09-05 DIAGNOSIS — E663 Overweight: Secondary | ICD-10-CM | POA: Diagnosis not present

## 2023-09-05 DIAGNOSIS — M5416 Radiculopathy, lumbar region: Secondary | ICD-10-CM

## 2023-09-05 DIAGNOSIS — R300 Dysuria: Secondary | ICD-10-CM | POA: Diagnosis not present

## 2023-09-05 DIAGNOSIS — E559 Vitamin D deficiency, unspecified: Secondary | ICD-10-CM | POA: Diagnosis not present

## 2023-09-05 DIAGNOSIS — M5136 Other intervertebral disc degeneration, lumbar region with discogenic back pain only: Secondary | ICD-10-CM

## 2023-09-05 DIAGNOSIS — E785 Hyperlipidemia, unspecified: Secondary | ICD-10-CM

## 2023-09-05 DIAGNOSIS — K219 Gastro-esophageal reflux disease without esophagitis: Secondary | ICD-10-CM

## 2023-09-05 DIAGNOSIS — N898 Other specified noninflammatory disorders of vagina: Secondary | ICD-10-CM

## 2023-09-05 LAB — URINALYSIS, COMPLETE
Bilirubin, UA: NEGATIVE
Glucose, UA: NEGATIVE
Nitrite, UA: NEGATIVE
Protein,UA: NEGATIVE
Specific Gravity, UA: 1.025 (ref 1.005–1.030)
Urobilinogen, Ur: 0.2 mg/dL (ref 0.2–1.0)
pH, UA: 5 (ref 5.0–7.5)

## 2023-09-05 LAB — WET PREP FOR TRICH, YEAST, CLUE
Clue Cell Exam: POSITIVE — AB
Trichomonas Exam: NEGATIVE
Yeast Exam: NEGATIVE

## 2023-09-05 MED ORDER — LIDOCAINE HCL 2 % IJ SOLN
10.0000 mL | Freq: Once | INTRAMUSCULAR | Status: AC
  Administered 2023-09-05: 200 mg via INTRADERMAL

## 2023-09-05 MED ORDER — FLUTICASONE PROPIONATE 50 MCG/ACT NA SUSP: 2.0000 | Freq: Every day | NASAL | 3 refills | Status: AC

## 2023-09-05 MED ORDER — LISINOPRIL-HYDROCHLOROTHIAZIDE 20-12.5 MG PO TABS: 0.5000 | ORAL_TABLET | Freq: Every day | ORAL | 3 refills | Status: DC

## 2023-09-05 MED ORDER — FLUCONAZOLE 150 MG PO TABS: 150.0000 mg | ORAL_TABLET | ORAL | 0 refills | Status: DC | PRN

## 2023-09-05 MED ORDER — METHYLPREDNISOLONE ACETATE 80 MG/ML IJ SUSP
80.0000 mg | Freq: Once | INTRAMUSCULAR | Status: AC
  Administered 2023-09-05: 80 mg via INTRAMUSCULAR

## 2023-09-05 NOTE — Progress Notes (Signed)
 Subjective:    Patient ID: Cheryl Lee, female    DOB: 08/06/1945, 78 y.o.   MRN: [id number]  Chief Complaint  Patient presents with   Medical Management of Chronic Issues   Urinary Tract Infection   Pt presents to the office today for chronic follow up.    She has a hx of closed compression fracture and bulging lumbar disc. She has a wedge compression fracture of L4 with sciatica pain.She continues to have intermittent aching pain of 5 out 10 when walking for a long distance down left leg, but 2 out 10 when sitting.  She completed PT and at this time does not want surgery. She is followed by Ortho.   Has CKD and avoids NSAID's.   She had a cholecystectomy on 08/23/22. Doing well.    She has osteoporosis and takes Prolia . Last dexa scan 06/24/23. Hypertension This is a chronic problem. The current episode started more than 1 year ago. The problem has been resolved since onset. The problem is controlled. Associated symptoms include malaise/fatigue, peripheral edema (slightly in left leg) and shortness of breath ("after walking"). Risk factors for coronary artery disease include dyslipidemia and sedentary lifestyle. The current treatment provides moderate improvement.  Gastroesophageal Reflux She complains of belching and heartburn. She reports no abdominal pain. This is a chronic problem. The current episode started more than 1 year ago. The problem occurs occasionally. The symptoms are aggravated by certain foods. She has tried a PPI for the symptoms. The treatment provided moderate relief.  Hyperlipidemia This is a chronic problem. The current episode started more than 1 year ago. The problem is controlled. Recent lipid tests were reviewed and are normal. She has no history of obesity. Associated symptoms include shortness of breath ("after walking"). Current antihyperlipidemic treatment includes statins. The current treatment provides moderate improvement of lipids. Risk factors for  coronary artery disease include dyslipidemia, hypertension, a sedentary lifestyle and post-menopausal.  Vaginal Itching The patient's primary symptoms include genital itching and vaginal discharge. The patient's pertinent negatives include no vaginal bleeding. This is a chronic problem. The current episode started more than 1 year ago. The problem occurs intermittently. The pain is mild. Pertinent negatives include no abdominal pain. She has tried nothing for the symptoms. The treatment provided no relief.  Shoulder Pain  The pain is present in the right shoulder. This is a new problem. The current episode started 1 to 4 weeks ago. There has been no history of extremity trauma. The problem occurs constantly. The quality of the pain is described as aching. The pain is at a severity of 10/10 (when lifting up). The pain is moderate. She has tried rest for the symptoms. The treatment provided mild relief.      Review of Systems  Constitutional:  Positive for malaise/fatigue.  Respiratory:  Positive for shortness of breath ("after walking").   Gastrointestinal:  Positive for heartburn. Negative for abdominal pain.  Genitourinary:  Positive for vaginal discharge.  All other systems reviewed and are negative.      Objective:   Physical Exam Vitals reviewed.  Constitutional:      General: She is not in acute distress.    Appearance: She is well-developed. She is obese.  HENT:     Head: Normocephalic and atraumatic.     Right Ear: Tympanic membrane normal.     Left Ear: Tympanic membrane normal.  Eyes:     Pupils: Pupils are equal, round, and reactive to light.  Neck:  Thyroid : No thyromegaly.  Cardiovascular:     Rate and Rhythm: Normal rate and regular rhythm.     Heart sounds: Normal heart sounds. No murmur heard. Pulmonary:     Effort: Pulmonary effort is normal. No respiratory distress.     Breath sounds: Normal breath sounds. No wheezing.  Abdominal:     General: Bowel sounds  are normal. There is no distension.     Palpations: Abdomen is soft.     Tenderness: There is no abdominal tenderness.  Musculoskeletal:        General: Tenderness present.     Cervical back: Normal range of motion and neck supple.     Comments: Pain in lumbar with standing and flexion, using cane, pain in right shoulder with abduction   Skin:    General: Skin is warm and dry.  Neurological:     Mental Status: She is alert and oriented to person, place, and time.     Cranial Nerves: No cranial nerve deficit.     Motor: Weakness present.     Gait: Gait abnormal.     Deep Tendon Reflexes: Reflexes are normal and symmetric.  Psychiatric:        Behavior: Behavior normal.        Thought Content: Thought content normal.        Judgment: Judgment normal.     rightshoulder prepped with betadine Injected with Marcaine  .5% plain and methylprednisolone  with 25 guage needle x 1. Patient tolerated well.   BP 98/65   Pulse 100   Temp 97.6 F (36.4 C)   Ht 5\' 1"  (1.549 m)   Wt 130 lb (59 kg)   LMP 08/05/1991   SpO2 98%   BMI 24.56 kg/m      Assessment & Plan:   Cheryl Lee comes in today with chief complaint of Medical Management of Chronic Issues and Urinary Tract Infection   Diagnosis and orders addressed:  1. Primary hypertension - lisinopril -hydrochlorothiazide  (ZESTORETIC ) 20-12.5 MG tablet; Take 0.5 tablets by mouth daily.  Dispense: 45 tablet; Refill: 3 - CMP14+EGFR  2. Dysuria (Primary) - Urinalysis, Complete - CMP14+EGFR  3. Stage 3a chronic kidney disease (CKD) (HCC) - CMP14+EGFR  4. Degeneration of intervertebral disc of lumbar region with discogenic back pain - CMP14+EGFR - methylPREDNISolone  acetate (DEPO-MEDROL ) injection 80 mg - lidocaine  (XYLOCAINE ) 2 % (with pres) injection 200 mg  5. Lumbar nerve root impingement - CMP14+EGFR  6. Vitamin D  deficiency - CMP14+EGFR  7. Overweight (BMI 25.0-29.9) - CMP14+EGFR  8. Age-related osteoporosis  without current pathological fracture - CMP14+EGFR  9. Hyperlipidemia with target LDL less than 100 - CMP14+EGFR  10. Gastroesophageal reflux disease without esophagitis - CMP14+EGFR  11. Vagina itching - WET PREP FOR TRICH, YEAST, CLUE  12. Acute pain of right shoulder  - methylPREDNISolone  acetate (DEPO-MEDROL ) injection 80 mg - lidocaine  (XYLOCAINE ) 2 % (with pres) injection 200 mg    Labs pending Continue current medications  Keep follow up with specialists  Health Maintenance reviewed Diet and exercise encouraged  Follow up plan: 4 months   Tommas Fragmin, FNP

## 2023-09-05 NOTE — Patient Instructions (Signed)

## 2023-09-06 LAB — CMP14+EGFR
ALT: 14 IU/L (ref 0–32)
AST: 15 IU/L (ref 0–40)
Albumin: 4.2 g/dL (ref 3.8–4.8)
Alkaline Phosphatase: 64 IU/L (ref 44–121)
BUN/Creatinine Ratio: 18 (ref 12–28)
BUN: 21 mg/dL (ref 8–27)
Bilirubin Total: 0.4 mg/dL (ref 0.0–1.2)
CO2: 20 mmol/L (ref 20–29)
Calcium: 9.8 mg/dL (ref 8.7–10.3)
Chloride: 103 mmol/L (ref 96–106)
Creatinine, Ser: 1.14 mg/dL — ABNORMAL HIGH (ref 0.57–1.00)
Globulin, Total: 2.3 g/dL (ref 1.5–4.5)
Glucose: 99 mg/dL (ref 70–99)
Potassium: 4.3 mmol/L (ref 3.5–5.2)
Sodium: 141 mmol/L (ref 134–144)
Total Protein: 6.5 g/dL (ref 6.0–8.5)
eGFR: 50 mL/min/{1.73_m2} — ABNORMAL LOW (ref >59)

## 2023-09-17 DIAGNOSIS — M5416 Radiculopathy, lumbar region: Secondary | ICD-10-CM | POA: Diagnosis not present

## 2023-10-03 DIAGNOSIS — M5416 Radiculopathy, lumbar region: Secondary | ICD-10-CM | POA: Diagnosis not present

## 2023-10-03 DIAGNOSIS — M792 Neuralgia and neuritis, unspecified: Secondary | ICD-10-CM | POA: Diagnosis not present

## 2023-10-03 DIAGNOSIS — M549 Dorsalgia, unspecified: Secondary | ICD-10-CM | POA: Diagnosis not present

## 2023-10-03 DIAGNOSIS — M79605 Pain in left leg: Secondary | ICD-10-CM | POA: Diagnosis not present

## 2023-10-19 DIAGNOSIS — M7918 Myalgia, other site: Secondary | ICD-10-CM | POA: Diagnosis not present

## 2023-11-11 DIAGNOSIS — M5416 Radiculopathy, lumbar region: Secondary | ICD-10-CM | POA: Diagnosis not present

## 2023-11-12 DIAGNOSIS — H5789 Other specified disorders of eye and adnexa: Secondary | ICD-10-CM | POA: Diagnosis not present

## 2023-11-19 ENCOUNTER — Ambulatory Visit: Attending: Family Medicine | Admitting: Physical Therapy

## 2023-11-19 ENCOUNTER — Encounter: Payer: Self-pay | Admitting: Physical Therapy

## 2023-11-19 ENCOUNTER — Other Ambulatory Visit: Payer: Self-pay

## 2023-11-19 DIAGNOSIS — R293 Abnormal posture: Secondary | ICD-10-CM | POA: Diagnosis not present

## 2023-11-19 DIAGNOSIS — M5459 Other low back pain: Secondary | ICD-10-CM | POA: Diagnosis not present

## 2023-11-19 DIAGNOSIS — M6281 Muscle weakness (generalized): Secondary | ICD-10-CM | POA: Insufficient documentation

## 2023-11-19 DIAGNOSIS — M5416 Radiculopathy, lumbar region: Secondary | ICD-10-CM | POA: Insufficient documentation

## 2023-11-19 DIAGNOSIS — M62830 Muscle spasm of back: Secondary | ICD-10-CM | POA: Diagnosis not present

## 2023-11-19 NOTE — Therapy (Signed)
 OUTPATIENT PHYSICAL THERAPY THORACOLUMBAR EVALUATION   Patient Name: Cheryl Lee MRN: [id number] DOB:06/19/1945, 78 y.o., female Today's Date: 11/19/2023  END OF SESSION:  PT End of Session - 11/19/23 1211     Visit Number 1    Number of Visits 12    Date for PT Re-Evaluation 02/17/24    PT Start Time 0935    PT Stop Time 1018    PT Time Calculation (min) 43 min    Activity Tolerance Patient tolerated treatment well    Behavior During Therapy WFL for tasks assessed/performed          Past Medical History:  Diagnosis Date   Acid reflux    Arthritis    Dyspnea    HNP (herniated nucleus pulposus), lumbar    Hypercholesteremia    Hypertension    Osteoporosis    Sinus congestion    Past Surgical History:  Procedure Laterality Date   BIOPSY  08/23/2022   Procedure: BIOPSY;  Surgeon: Wilhelmenia Aloha Raddle., MD;  Location: [facility];  Service: Gastroenterology;;   CATARACT EXTRACTION     CHOLECYSTECTOMY N/A 08/23/2022   Procedure: LAPAROSCOPIC CHOLECYSTECTOMY;  Surgeon: Dasie Leonor CROME, MD;  Location: [facility];  Service: General;  Laterality: N/A;   COLONOSCOPY     COLONOSCOPY N/A 01/21/2020   Procedure: COLONOSCOPY;  Surgeon: Golda Claudis PENNER, MD;  Location: [facility];  Service: Endoscopy;  Laterality: N/A;  730   ERCP N/A 08/23/2022   Procedure: ENDOSCOPIC RETROGRADE CHOLANGIOPANCREATOGRAPHY (ERCP);  Surgeon: Wilhelmenia Aloha Raddle., MD;  Location: [facility];  Service: Gastroenterology;  Laterality: N/A;   LUMBAR LAMINECTOMY/DECOMPRESSION MICRODISCECTOMY Left 05/01/2017   Procedure: Microdiscectomy - Lumbar four-five  - left;  Surgeon: Onetha Kuba, MD;  Location: [facility];  Service: Neurosurgery;  Laterality: Left;   POLYPECTOMY  01/21/2020   Procedure: POLYPECTOMY;  Surgeon: Golda Claudis PENNER, MD;  Location: [facility];  Service: Endoscopy;;   REMOVAL OF STONES  08/23/2022   Procedure: REMOVAL OF STONES;  Surgeon: Wilhelmenia Aloha Raddle., MD;  Location: [facility];  Service:  Gastroenterology;;   Right snkle repair     SPHINCTEROTOMY  08/23/2022   Procedure: ANNETT;  Surgeon: Mansouraty, Aloha Raddle., MD;  Location: [facility];  Service: Gastroenterology;;   Patient Active Problem List   Diagnosis Date Noted   Lumbar compression fracture (HCC) 07/05/2022   Leukocytosis 07/05/2022   Stage 3a chronic kidney disease (CKD) (HCC) 07/05/2022   Lumbar nerve root impingement 07/08/2018   DDD (degenerative disc disease), lumbar 07/08/2018   Vitamin D  deficiency 03/03/2018   HNP (herniated nucleus pulposus), lumbar 05/01/2017   Overweight (BMI 25.0-29.9) 12/21/2014   GERD (gastroesophageal reflux disease)    Hyperlipidemia with target LDL less than 100    Hypertension    Osteoporosis    REFERRING PROVIDER: Radiculopathy, lumbar region.    REFERRING DIAG: Radiculopathy, lumbar region.  Rationale for Evaluation and Treatment: Rehabilitation  THERAPY DIAG:  Other low back pain - Plan: PT plan of care cert/re-cert  Abnormal posture - Plan: PT plan of care cert/re-cert  ONSET DATE: Over a year ago.'  SUBJECTIVE:  SUBJECTIVE STATEMENT: The patient presents to the clinic with c/o chronic low back and left lower pain.  Her CC is her left lower extremity pain.  Her pain increases with walking and standing.  Sitting of lying down decreases her pain.  She states sitting and lying down and sitting down decreases her pain.  She states she has a blown disc but is not a surgical candidate.    PERTINENT HISTORY:  Please see above.  OP.  Prior lumbar surgery.  PAIN:  Are you having pain? Yes: NPRS scale: 6/10. Pain location: Left LE. Pain description: Soreness, burning, stinging and achy. Aggravating factors: As above. Relieving factors: As above.  PRECAUTIONS: Other: OP.  Avoid  spinal loading.  Chronic L2 compression fracture with 33% height loss   RED FLAGS: None   WEIGHT BEARING RESTRICTIONS: No  FALLS:  Has patient fallen in last 6 months? No  LIVING ENVIRONMENT: Lives in: House/apartment Has following equipment at home: Quad cane.  OCCUPATION: Retired.    PLOF: Independent with basic ADLs  PATIENT GOALS: Walk and stand without left LE pain.   OBJECTIVE:  Note: Objective measures were completed at Evaluation unless otherwise noted.  DIAGNOSTICS:  Both hips are normally located. Mild degenerative changes but no fracture or AVN. The pubic symphysis and SI joints are intact. No pelvic fractures or bone lesions. Advanced degenerative disc disease noted at L4-5.   IMPRESSION: 1. Mild degenerative changes but no acute bony findings. 2. Advanced degenerative disc disease at L4-5.  Lumbar MRI: 08/29/2023  -severe deg. disc disease  -L4-5, left post-lateral disc protrusion causing left lateral recess and moderate central canal stenosis with displacement of the left L5 and S1 nerve roots  -L3-4 small right paracentral disc protrusion causing mod right and mild central canal stenosis  -spinal stenosis, L1-2 (mild); L2-3 (mild)  -facet arthropathy, L4-5, L5-S1  -chronic L2 compression fracture with 33% height loss    PATIENT SURVEYS:  Modified Owestry:  22/50.  POSTURE: rounded shoulders, forward head, decreased lumbar lordosis, and flexed trunk , kyphosis, scoliosis.    PALPATION: Minimal tenderness in left lower lumbar region. Increased tenderness over left SIJ region.     LUMBAR ROM:   Patient can achieve an upright posture.    LOWER EXTREMITY ROM:     WNL.  LOWER EXTREMITY MMT:    Bilateral hip flexion is a solid 4 to 4+/5.  Hip abduction is normal.  Patient able to provide a normal strength grade via MMTing for bilateral hip and ankle strength.  LUMBAR SPECIAL TESTS:  Some pain reported with bilateral SLR testing.  Equal leg  lengths.  (-) FABER testing.  Patellar reflexes are normal.  Bilateral Achilles are tarce.   GAIT: Patient walks safely with a quad cane.  She walks in a flexed trunk posture and tends to lean away from the painful side.  TREATMENT DATE: IFC at 80-150 Hz on 40% scan x 15 minutes to patient's left lower lumbar/SIJ region.    Normal modality response following removal of modality.  PATIENT EDUCATION:  Education details:  Person educated:  International aid/development worker:  Education comprehension:   HOME EXERCISE PROGRAM:   ASSESSMENT:  CLINICAL IMPRESSION: The patient presents to [facility] with chronic low back pain and left LE pain.  Her CC is left LE pain that increases to high levels with walking and standing. The pain will go to her lower leg.  She has multiple postural abnormalities.   She has minimal tenderness in left lower lumbar region and increased tenderness over left SIJ region.   She is walking safely with a quad cane.  Her ODI is 22/50. Patient may benefit from skilled physical therapy intervention to address pain and deficits.  OBJECTIVE IMPAIRMENTS: Abnormal gait, decreased activity tolerance, decreased strength, increased muscle spasms, postural dysfunction, and pain.   ACTIVITY LIMITATIONS: carrying, lifting, bending, standing, sleeping, and locomotion level  PARTICIPATION LIMITATIONS: meal prep, cleaning, laundry, shopping, community activity, and yard work  PERSONAL FACTORS: Time since onset of injury/illness/exacerbation and 1-2 comorbidities: prior lumbar surgery, OP are also affecting patient's functional outcome.   REHAB POTENTIAL: Fair plus/good minus.  CLINICAL DECISION MAKING: Evolving/moderate complexity  EVALUATION COMPLEXITY: Moderate   GOALS:  SHORT TERM GOALS: Target date: 12/03/23.  Ind with an initial HEP. Goal status: INITIAL  LONG TERM  GOALS: Target date: 02/17/24.  Patient stand 15-20 minutes with left lower extremity pain not > 3/10.  Goal status: INITIAL  2.  Walk a community distance with left lower extremity pain not > 3/10.   Goal status: INITIAL  3.  Perform ADL's with left lower extremity pain not > 3/10.   Goal status: INITIAL   PLAN:  PT FREQUENCY: 2x/week  PT DURATION: 6 weeks  PLANNED INTERVENTIONS: 97110-Therapeutic exercises, 97530- Therapeutic activity, [id number]- Neuromuscular re-education, 97535- Self Care, [id number]- Manual therapy, [id number]- Electrical stimulation (unattended), 97035- Ultrasound, Patient/Family education, and Moist heat.  PLAN FOR NEXT SESSION: Core exercise progression, modalities and STW/M as needed.     Jaison Petraglia, [place], PT 11/19/2023, 12:36 PM

## 2023-11-21 ENCOUNTER — Ambulatory Visit: Admitting: *Deleted

## 2023-11-21 ENCOUNTER — Encounter: Payer: Self-pay | Admitting: *Deleted

## 2023-11-21 DIAGNOSIS — M5459 Other low back pain: Secondary | ICD-10-CM | POA: Diagnosis not present

## 2023-11-21 DIAGNOSIS — M6281 Muscle weakness (generalized): Secondary | ICD-10-CM | POA: Diagnosis not present

## 2023-11-21 DIAGNOSIS — M62830 Muscle spasm of back: Secondary | ICD-10-CM

## 2023-11-21 DIAGNOSIS — M5416 Radiculopathy, lumbar region: Secondary | ICD-10-CM | POA: Diagnosis not present

## 2023-11-21 DIAGNOSIS — R293 Abnormal posture: Secondary | ICD-10-CM

## 2023-11-21 NOTE — Therapy (Signed)
 OUTPATIENT PHYSICAL THERAPY THORACOLUMBAR TREATMENT   Patient Name: Cheryl Lee MRN: [id number] DOB:Jul 01, 1945, 78 y.o., female Today's Date: 11/21/2023  END OF SESSION:  PT End of Session - 11/21/23 0929     Visit Number 2    Number of Visits 12    Date for PT Re-Evaluation 02/17/24    PT Start Time 0929    PT Stop Time 1021    PT Time Calculation (min) 52 min          Past Medical History:  Diagnosis Date   Acid reflux    Arthritis    Dyspnea    HNP (herniated nucleus pulposus), lumbar    Hypercholesteremia    Hypertension    Osteoporosis    Sinus congestion    Past Surgical History:  Procedure Laterality Date   BIOPSY  08/23/2022   Procedure: BIOPSY;  Surgeon: Wilhelmenia Aloha Raddle., MD;  Location: [facility];  Service: Gastroenterology;;   CATARACT EXTRACTION     CHOLECYSTECTOMY N/A 08/23/2022   Procedure: LAPAROSCOPIC CHOLECYSTECTOMY;  Surgeon: Dasie Leonor CROME, MD;  Location: [facility];  Service: General;  Laterality: N/A;   COLONOSCOPY     COLONOSCOPY N/A 01/21/2020   Procedure: COLONOSCOPY;  Surgeon: Golda Claudis PENNER, MD;  [facility]: [facility];  Service: Endoscopy;  Laterality: N/A;  730   ERCP N/A 08/23/2022   Procedure: ENDOSCOPIC RETROGRADE CHOLANGIOPANCREATOGRAPHY (ERCP);  Surgeon: Wilhelmenia Aloha Raddle., MD;  Location: [facility];  Service: Gastroenterology;  Laterality: N/A;   LUMBAR LAMINECTOMY/DECOMPRESSION MICRODISCECTOMY Left 05/01/2017   Procedure: Microdiscectomy - Lumbar four-five  - left;  Surgeon: Onetha Kuba, MD;  Location: [facility];  Service: Neurosurgery;  Laterality: Left;   POLYPECTOMY  01/21/2020   Procedure: POLYPECTOMY;  Surgeon: Golda Claudis PENNER, MD;  [facility];  Service: Endoscopy;;   REMOVAL OF STONES  08/23/2022   Procedure: REMOVAL OF STONES;  Surgeon: Wilhelmenia Aloha Raddle., MD;  Location: [facility];  Service: Gastroenterology;;   Right snkle repair     SPHINCTEROTOMY  08/23/2022   Procedure: ANNETT;  Surgeon: Mansouraty,  Aloha Raddle., MD;  Location: [facility];  Service: Gastroenterology;;   Patient Active Problem List   Diagnosis Date Noted   Lumbar compression fracture (HCC) 07/05/2022   Leukocytosis 07/05/2022   Stage 3a chronic kidney disease (CKD) (HCC) 07/05/2022   Lumbar nerve root impingement 07/08/2018   DDD (degenerative disc disease), lumbar 07/08/2018   Vitamin D  deficiency 03/03/2018   HNP (herniated nucleus pulposus), lumbar 05/01/2017   Overweight (BMI 25.0-29.9) 12/21/2014   GERD (gastroesophageal reflux disease)    Hyperlipidemia with target LDL less than 100    Hypertension    Osteoporosis    REFERRING PROVIDER: Radiculopathy, lumbar region.    REFERRING DIAG: Radiculopathy, lumbar region.  Rationale for Evaluation and Treatment: Rehabilitation  THERAPY DIAG:  Other low back pain  Abnormal posture  Radiculopathy, lumbar region  Muscle weakness (generalized)  Muscle spasm of back  ONSET DATE: Over a year ago.'  SUBJECTIVE:  SUBJECTIVE STATEMENT: The patient presents to the clinic with c/o chronic low back and left lower pain.  Her CC is her left lower extremity pain.  4-5/10 today PERTINENT HISTORY:  Please see above.  OP.  Prior lumbar surgery.  PAIN:  Are you having pain? Yes: NPRS scale: 4-5/10. Pain location: Left LE. Pain description: Soreness, burning, stinging and achy. Aggravating factors: As above. Relieving factors: As above.  PRECAUTIONS: Other: OP.  Avoid spinal loading.  Chronic L2 compression fracture with 33% height loss   RED FLAGS: None   WEIGHT BEARING RESTRICTIONS: No  FALLS:  Has patient fallen in last 6 months? No  LIVING ENVIRONMENT: Lives in: House/apartment Has following equipment at home: Quad cane.  OCCUPATION: Retired.    PLOF: Independent with  basic ADLs  PATIENT GOALS: Walk and stand without left LE pain.   OBJECTIVE:  Note: Objective measures were completed at Evaluation unless otherwise noted.  DIAGNOSTICS:  Both hips are normally located. Mild degenerative changes but no fracture or AVN. The pubic symphysis and SI joints are intact. No pelvic fractures or bone lesions. Advanced degenerative disc disease noted at L4-5.   IMPRESSION: 1. Mild degenerative changes but no acute bony findings. 2. Advanced degenerative disc disease at L4-5.  Lumbar MRI: 08/29/2023  -severe deg. disc disease  -L4-5, left post-lateral disc protrusion causing left lateral recess and moderate central canal stenosis with displacement of the left L5 and S1 nerve roots  -L3-4 small right paracentral disc protrusion causing mod right and mild central canal stenosis  -spinal stenosis, L1-2 (mild); L2-3 (mild)  -facet arthropathy, L4-5, L5-S1  -chronic L2 compression fracture with 33% height loss    PATIENT SURVEYS:  Modified Owestry:  22/50.  POSTURE: rounded shoulders, forward head, decreased lumbar lordosis, and flexed trunk , kyphosis, scoliosis.    PALPATION: Minimal tenderness in left lower lumbar region. Increased tenderness over left SIJ region.     LUMBAR ROM:   Patient can achieve an upright posture.    LOWER EXTREMITY ROM:     WNL.  LOWER EXTREMITY MMT:    Bilateral hip flexion is a solid 4 to 4+/5.  Hip abduction is normal.  Patient able to provide a normal strength grade via MMTing for bilateral hip and ankle strength.  LUMBAR SPECIAL TESTS:  Some pain reported with bilateral SLR testing.  Equal leg lengths.  (-) FABER testing.  Patellar reflexes are normal.  Bilateral Achilles are tarce.   GAIT: Patient walks safely with a quad cane.  She walks in a flexed trunk posture and tends to lean away from the painful side.  TREATMENT DATE: 11-21-23 Nustep L3 x 16 mins Manual STW/TPR to LT LB paras, QL, SIJ and glute while  seated leaning forward on plinth and pillows IFC at 80-150 Hz on 40% scan x 15 minutes to patient's left lower lumbar/SIJ region.  Seated    Normal modality response following removal of modality.  PATIENT EDUCATION:  Education details:  Person educated:  International aid/development worker:  Education comprehension:   HOME EXERCISE PROGRAM:   ASSESSMENT:  CLINICAL IMPRESSION: The patient presents to [facility] with chronic low back pain and left LE pain. Rx focused on therex, STW, as well as IFC . She did well with session, but had notable tenderness LT side in all areas. Pt did good with some decreased pain end of session.    OBJECTIVE IMPAIRMENTS: Abnormal gait, decreased activity tolerance, decreased strength, increased muscle spasms, postural dysfunction, and pain.   ACTIVITY LIMITATIONS: carrying, lifting, bending, standing, sleeping, and locomotion level  PARTICIPATION LIMITATIONS: meal prep, cleaning, laundry, shopping, community activity, and yard work  PERSONAL FACTORS: Time since onset of injury/illness/exacerbation and 1-2 comorbidities: prior lumbar surgery, OP are also affecting patient's functional outcome.   REHAB POTENTIAL: Fair plus/good minus.  CLINICAL DECISION MAKING: Evolving/moderate complexity  EVALUATION COMPLEXITY: Moderate   GOALS:  SHORT TERM GOALS: Target date: 12/03/23.  Ind with an initial HEP. Goal status: INITIAL  LONG TERM GOALS: Target date: 02/17/24.  Patient stand 15-20 minutes with left lower extremity pain not > 3/10.  Goal status: INITIAL  2.  Walk a community distance with left lower extremity pain not > 3/10.   Goal status: INITIAL  3.  Perform ADL's with left lower extremity pain not > 3/10.   Goal status: INITIAL   PLAN:  PT FREQUENCY: 2x/week  PT DURATION: 6 weeks  PLANNED INTERVENTIONS: 97110-Therapeutic exercises,  97530- Therapeutic activity, [id number]- Neuromuscular re-education, 97535- Self Care, 02859- Manual therapy, [id number]- Electrical stimulation (unattended), 97035- Ultrasound, Patient/Family education, and Moist heat.  PLAN FOR NEXT SESSION:       Core exercise progression, modalities and STW/M as needed.     Johndaniel Catlin,CHRIS, PTA 11/21/2023, 10:31 AM

## 2023-11-26 ENCOUNTER — Ambulatory Visit: Admitting: Physical Therapy

## 2023-11-26 DIAGNOSIS — M5416 Radiculopathy, lumbar region: Secondary | ICD-10-CM | POA: Diagnosis not present

## 2023-11-26 DIAGNOSIS — R293 Abnormal posture: Secondary | ICD-10-CM

## 2023-11-26 DIAGNOSIS — M5459 Other low back pain: Secondary | ICD-10-CM

## 2023-11-26 DIAGNOSIS — M62830 Muscle spasm of back: Secondary | ICD-10-CM | POA: Diagnosis not present

## 2023-11-26 DIAGNOSIS — M6281 Muscle weakness (generalized): Secondary | ICD-10-CM | POA: Diagnosis not present

## 2023-11-26 NOTE — Therapy (Signed)
 OUTPATIENT PHYSICAL THERAPY THORACOLUMBAR TREATMENT   Patient Name: Cheryl Lee MRN: [id number] DOB:Dec 25, 1945, 78 y.o., female Today's Date: 11/26/2023  END OF SESSION:  PT End of Session - 11/26/23 1002     Visit Number 3    Number of Visits 12    Date for PT Re-Evaluation 02/17/24    PT Start Time 0930    PT Stop Time 1024    PT Time Calculation (min) 54 min    Activity Tolerance Patient tolerated treatment well    Behavior During Therapy WFL for tasks assessed/performed          Past Medical History:  Diagnosis Date   Acid reflux    Arthritis    Dyspnea    HNP (herniated nucleus pulposus), lumbar    Hypercholesteremia    Hypertension    Osteoporosis    Sinus congestion    Past Surgical History:  Procedure Laterality Date   BIOPSY  08/23/2022   Procedure: BIOPSY;  Surgeon: Wilhelmenia Aloha Raddle., MD;  Location: [facility];  Service: Gastroenterology;;   CATARACT EXTRACTION     CHOLECYSTECTOMY N/A 08/23/2022   Procedure: LAPAROSCOPIC CHOLECYSTECTOMY;  Surgeon: Dasie Leonor CROME, MD;  Location: [facility];  Service: General;  Laterality: N/A;   COLONOSCOPY     COLONOSCOPY N/A 01/21/2020   Procedure: COLONOSCOPY;  Surgeon: Golda Claudis PENNER, MD;  [facility];  Service: Endoscopy;  Laterality: N/A;  730   ERCP N/A 08/23/2022   Procedure: ENDOSCOPIC RETROGRADE CHOLANGIOPANCREATOGRAPHY (ERCP);  Surgeon: Wilhelmenia Aloha Raddle., MD;  Location: [facility];  Service: Gastroenterology;  Laterality: N/A;   LUMBAR LAMINECTOMY/DECOMPRESSION MICRODISCECTOMY Left 05/01/2017   Procedure: Microdiscectomy - Lumbar four-five  - left;  Surgeon: Onetha Kuba, MD;  Location: [facility];  Service: Neurosurgery;  Laterality: Left;   POLYPECTOMY  01/21/2020   Procedure: POLYPECTOMY;  Surgeon: Golda Claudis PENNER, MD;  [facility];  Service: Endoscopy;;   REMOVAL OF STONES  08/23/2022   Procedure: REMOVAL OF STONES;  Surgeon: Wilhelmenia Aloha Raddle., MD;  Location: [facility];  Service:  Gastroenterology;;   Right snkle repair     SPHINCTEROTOMY  08/23/2022   Procedure: ANNETT;  Surgeon: Mansouraty, Aloha Raddle., MD;  Location: [facility];  Service: Gastroenterology;;   Patient Active Problem List   Diagnosis Date Noted   Lumbar compression fracture (HCC) 07/05/2022   Leukocytosis 07/05/2022   Stage 3a chronic kidney disease (CKD) (HCC) 07/05/2022   Lumbar nerve root impingement 07/08/2018   DDD (degenerative disc disease), lumbar 07/08/2018   Vitamin D  deficiency 03/03/2018   HNP (herniated nucleus pulposus), lumbar 05/01/2017   Overweight (BMI 25.0-29.9) 12/21/2014   GERD (gastroesophageal reflux disease)    Hyperlipidemia with target LDL less than 100    Hypertension    Osteoporosis    REFERRING PROVIDER: Radiculopathy, lumbar region.    REFERRING DIAG: Radiculopathy, lumbar region.  Rationale for Evaluation and Treatment: Rehabilitation  THERAPY DIAG:  Other low back pain  Abnormal posture  Radiculopathy, lumbar region  ONSET DATE: Over a year ago.'  SUBJECTIVE:  SUBJECTIVE STATEMENT:  Felt good after last treatment and walked better for awhile but then left leg pain came back.  Getting an injection tomorrow at L1-2.  PERTINENT HISTORY:  Please see above.  OP.  Prior lumbar surgery.  PAIN:  Are you having pain? Yes: NPRS scale: 4-5/10. Pain location: Left LE. Pain description: Soreness, burning, stinging and achy. Aggravating factors: As above. Relieving factors: As above.  PRECAUTIONS: Other: OP.  Avoid spinal loading.  Chronic L2 compression fracture with 33% height loss   RED FLAGS: None   WEIGHT BEARING RESTRICTIONS: No  FALLS:  Has patient fallen in last 6 months? No  LIVING ENVIRONMENT: Lives in: House/apartment Has following equipment at  home: Quad cane.  OCCUPATION: Retired.    PLOF: Independent with basic ADLs  PATIENT GOALS: Walk and stand without left LE pain.   OBJECTIVE:  Note: Objective measures were completed at Evaluation unless otherwise noted.  DIAGNOSTICS:  Both hips are normally located. Mild degenerative changes but no fracture or AVN. The pubic symphysis and SI joints are intact. No pelvic fractures or bone lesions. Advanced degenerative disc disease noted at L4-5.   IMPRESSION: 1. Mild degenerative changes but no acute bony findings. 2. Advanced degenerative disc disease at L4-5.  Lumbar MRI: 08/29/2023  -severe deg. disc disease  -L4-5, left post-lateral disc protrusion causing left lateral recess and moderate central canal stenosis with displacement of the left L5 and S1 nerve roots  -L3-4 small right paracentral disc protrusion causing mod right and mild central canal stenosis  -spinal stenosis, L1-2 (mild); L2-3 (mild)  -facet arthropathy, L4-5, L5-S1  -chronic L2 compression fracture with 33% height loss    PATIENT SURVEYS:  Modified Owestry:  22/50.  POSTURE: rounded shoulders, forward head, decreased lumbar lordosis, and flexed trunk , kyphosis, scoliosis.    PALPATION: Minimal tenderness in left lower lumbar region. Increased tenderness over left SIJ region.     LUMBAR ROM:   Patient can achieve an upright posture.    LOWER EXTREMITY ROM:     WNL.  LOWER EXTREMITY MMT:    Bilateral hip flexion is a solid 4 to 4+/5.  Hip abduction is normal.  Patient able to provide a normal strength grade via MMTing for bilateral hip and ankle strength.  LUMBAR SPECIAL TESTS:  Some pain reported with bilateral SLR testing.  Equal leg lengths.  (-) FABER testing.  Patellar reflexes are normal.  Bilateral Achilles are tarce.   GAIT: Patient walks safely with a quad cane.  She walks in a flexed trunk posture and tends to lean away from the painful side.  TREATMENT DATE:   11/26/23:   Nustep level 3 x 15 minutes f/b STW/M x 8 minuets to patient's left L1-3 region with patient in left sdly position with folded pillow between knees fro comfort f/b IFC at 80-150 Hz on 40% scan x 20 minutes    11-21-23 Nustep L3 x 16 mins Manual STW/TPR to LT LB paras, QL, SIJ and glute while seated leaning forward on plinth and pillows IFC at 80-150 Hz on 40% scan x 15 minutes to patient's left lower lumbar/SIJ region.  Seated    Normal modality response following removal of modality.  PATIENT EDUCATION:  Education details:  Person educated:  International aid/development worker:  Education comprehension:   HOME EXERCISE PROGRAM:   ASSESSMENT:  CLINICAL IMPRESSION: Patient had temporary pain-relief after last treatment with good response to STW/M this session.  Patient felt better following.    OBJECTIVE IMPAIRMENTS: Abnormal gait, decreased activity tolerance, decreased strength, increased muscle spasms, postural dysfunction, and pain.   ACTIVITY LIMITATIONS: carrying, lifting, bending, standing, sleeping, and locomotion level  PARTICIPATION LIMITATIONS: meal prep, cleaning, laundry, shopping, community activity, and yard work  PERSONAL FACTORS: Time since onset of injury/illness/exacerbation and 1-2 comorbidities: prior lumbar surgery, OP are also affecting patient's functional outcome.   REHAB POTENTIAL: Fair plus/good minus.  CLINICAL DECISION MAKING: Evolving/moderate complexity  EVALUATION COMPLEXITY: Moderate   GOALS:  SHORT TERM GOALS: Target date: 12/03/23.  Ind with an initial HEP. Goal status: INITIAL  LONG TERM GOALS: Target date: 02/17/24.  Patient stand 15-20 minutes with left lower extremity pain not > 3/10.  Goal status: INITIAL  2.  Walk a community distance with left lower extremity pain not > 3/10.   Goal status: INITIAL  3.  Perform ADL's with  left lower extremity pain not > 3/10.   Goal status: INITIAL   PLAN:  PT FREQUENCY: 2x/week  PT DURATION: 6 weeks  PLANNED INTERVENTIONS: 97110-Therapeutic exercises, 97530- Therapeutic activity, [id number]- Neuromuscular re-education, 97535- Self Care, 02859- Manual therapy, [id number]- Electrical stimulation (unattended), 97035- Ultrasound, Patient/Family education, and Moist heat.  PLAN FOR NEXT SESSION:       Core exercise progression, modalities and STW/M as needed.     Ailis Rigaud, [place], PT 11/26/2023, 10:25 AM

## 2023-11-27 DIAGNOSIS — M5416 Radiculopathy, lumbar region: Secondary | ICD-10-CM | POA: Diagnosis not present

## 2023-11-28 ENCOUNTER — Encounter: Payer: Self-pay | Admitting: *Deleted

## 2023-11-28 ENCOUNTER — Ambulatory Visit: Admitting: *Deleted

## 2023-11-28 DIAGNOSIS — M62830 Muscle spasm of back: Secondary | ICD-10-CM | POA: Diagnosis not present

## 2023-11-28 DIAGNOSIS — M5459 Other low back pain: Secondary | ICD-10-CM

## 2023-11-28 DIAGNOSIS — M5416 Radiculopathy, lumbar region: Secondary | ICD-10-CM

## 2023-11-28 DIAGNOSIS — R293 Abnormal posture: Secondary | ICD-10-CM | POA: Diagnosis not present

## 2023-11-28 DIAGNOSIS — M6281 Muscle weakness (generalized): Secondary | ICD-10-CM

## 2023-11-28 NOTE — Therapy (Signed)
 OUTPATIENT PHYSICAL THERAPY THORACOLUMBAR TREATMENT   Patient Name: Cheryl Lee MRN: [id number] DOB:Mar 29, 1946, 78 y.o., female Today's Date: 11/28/2023  END OF SESSION:  PT End of Session - 11/28/23 0928     Visit Number 4    Number of Visits 12    Date for PT Re-Evaluation 02/17/24    PT Start Time 0928          Past Medical History:  Diagnosis Date   Acid reflux    Arthritis    Dyspnea    HNP (herniated nucleus pulposus), lumbar    Hypercholesteremia    Hypertension    Osteoporosis    Sinus congestion    Past Surgical History:  Procedure Laterality Date   BIOPSY  08/23/2022   Procedure: BIOPSY;  Surgeon: Wilhelmenia Aloha Raddle., MD;  Location: [facility];  Service: Gastroenterology;;   CATARACT EXTRACTION     CHOLECYSTECTOMY N/A 08/23/2022   Procedure: LAPAROSCOPIC CHOLECYSTECTOMY;  Surgeon: Dasie Leonor CROME, MD;  Location: [facility];  Service: General;  Laterality: N/A;   COLONOSCOPY     COLONOSCOPY N/A 01/21/2020   Procedure: COLONOSCOPY;  Surgeon: Golda Claudis PENNER, MD;  [facility];  Service: Endoscopy;  Laterality: N/A;  730   ERCP N/A 08/23/2022   Procedure: ENDOSCOPIC RETROGRADE CHOLANGIOPANCREATOGRAPHY (ERCP);  Surgeon: Wilhelmenia Aloha Raddle., MD;  Location: [facility];  Service: Gastroenterology;  Laterality: N/A;   LUMBAR LAMINECTOMY/DECOMPRESSION MICRODISCECTOMY Left 05/01/2017   Procedure: Microdiscectomy - Lumbar four-five  - left;  Surgeon: Onetha Kuba, MD;  Location: [facility];  Service: Neurosurgery;  Laterality: Left;   POLYPECTOMY  01/21/2020   Procedure: POLYPECTOMY;  Surgeon: Golda Claudis PENNER, MD;  [facility];  Service: Endoscopy;;   REMOVAL OF STONES  08/23/2022   Procedure: REMOVAL OF STONES;  Surgeon: Wilhelmenia Aloha Raddle., MD;  Location: [facility];  Service: Gastroenterology;;   Right snkle repair     SPHINCTEROTOMY  08/23/2022   Procedure: ANNETT;  Surgeon: Mansouraty, Aloha Raddle., MD;  Location: [facility];  Service:  Gastroenterology;;   Patient Active Problem List   Diagnosis Date Noted   Lumbar compression fracture (HCC) 07/05/2022   Leukocytosis 07/05/2022   Stage 3a chronic kidney disease (CKD) (HCC) 07/05/2022   Lumbar nerve root impingement 07/08/2018   DDD (degenerative disc disease), lumbar 07/08/2018   Vitamin D  deficiency 03/03/2018   HNP (herniated nucleus pulposus), lumbar 05/01/2017   Overweight (BMI 25.0-29.9) 12/21/2014   GERD (gastroesophageal reflux disease)    Hyperlipidemia with target LDL less than 100    Hypertension    Osteoporosis    REFERRING PROVIDER: Radiculopathy, lumbar region.    REFERRING DIAG: Radiculopathy, lumbar region.  Rationale for Evaluation and Treatment: Rehabilitation  THERAPY DIAG:  Other low back pain  Abnormal posture  Radiculopathy, lumbar region  Muscle weakness (generalized)  Muscle spasm of back  ONSET DATE: Over a year ago.'  SUBJECTIVE:  SUBJECTIVE STATEMENT: Had an injection yesterday at L1-2. Doing Okay today  PERTINENT HISTORY:  Please see above.  OP.  Prior lumbar surgery.  PAIN:  Are you having pain? Yes: NPRS scale: 4-5/10. Pain location: Left LE. Pain description: Soreness, burning, stinging and achy. Aggravating factors: As above. Relieving factors: As above.  PRECAUTIONS: Other: OP.  Avoid spinal loading.  Chronic L2 compression fracture with 33% height loss   RED FLAGS: None   WEIGHT BEARING RESTRICTIONS: No  FALLS:  Has patient fallen in last 6 months? No  LIVING ENVIRONMENT: Lives in: House/apartment Has following equipment at home: Quad cane.  OCCUPATION: Retired.    PLOF: Independent with basic ADLs  PATIENT GOALS: Walk and stand without left LE pain.   OBJECTIVE:  Note: Objective measures were completed at  Evaluation unless otherwise noted.  DIAGNOSTICS:  Both hips are normally located. Mild degenerative changes but no fracture or AVN. The pubic symphysis and SI joints are intact. No pelvic fractures or bone lesions. Advanced degenerative disc disease noted at L4-5.   IMPRESSION: 1. Mild degenerative changes but no acute bony findings. 2. Advanced degenerative disc disease at L4-5.  Lumbar MRI: 08/29/2023  -severe deg. disc disease  -L4-5, left post-lateral disc protrusion causing left lateral recess and moderate central canal stenosis with displacement of the left L5 and S1 nerve roots  -L3-4 small right paracentral disc protrusion causing mod right and mild central canal stenosis  -spinal stenosis, L1-2 (mild); L2-3 (mild)  -facet arthropathy, L4-5, L5-S1  -chronic L2 compression fracture with 33% height loss    PATIENT SURVEYS:  Modified Owestry:  22/50.  POSTURE: rounded shoulders, forward head, decreased lumbar lordosis, and flexed trunk , kyphosis, scoliosis.    PALPATION: Minimal tenderness in left lower lumbar region. Increased tenderness over left SIJ region.     LUMBAR ROM:   Patient can achieve an upright posture.    LOWER EXTREMITY ROM:     WNL.  LOWER EXTREMITY MMT:    Bilateral hip flexion is a solid 4 to 4+/5.  Hip abduction is normal.  Patient able to provide a normal strength grade via MMTing for bilateral hip and ankle strength.  LUMBAR SPECIAL TESTS:  Some pain reported with bilateral SLR testing.  Equal leg lengths.  (-) FABER testing.  Patellar reflexes are normal.  Bilateral Achilles are tarce.   GAIT: Patient walks safely with a quad cane.  She walks in a flexed trunk posture and tends to lean away from the painful side.  TREATMENT DATE:   11/28/23:  Nustep level 3 x 18 minutes STW/M x 12  minuets to patient's left LB paras and SIJ with patient in RT sdly position with folded pillow between knee IFC at 80-150 Hz on 40% scan x 15 minutes     11-21-23 Nustep L3 x 16 mins Manual STW/TPR to LT LB paras, QL, SIJ and glute while seated leaning forward on plinth and pillows IFC at 80-150 Hz on 40% scan x 15 minutes to patient's left lower lumbar/SIJ region.  Seated    Normal modality response following removal of modality.  PATIENT EDUCATION:  Education details:  Person educated:  International aid/development worker:  Education comprehension:   HOME EXERCISE PROGRAM:   ASSESSMENT:  CLINICAL IMPRESSION: Patient reports having Injection yesterday and doing okay today. She was able to continue with light ex f/b STW to LT paras and SIJ and did well. Some decreased pain/ soreness end of session.  OBJECTIVE IMPAIRMENTS: Abnormal gait, decreased activity tolerance, decreased strength, increased muscle spasms, postural dysfunction, and pain.   ACTIVITY LIMITATIONS: carrying, lifting, bending, standing, sleeping, and locomotion level  PARTICIPATION LIMITATIONS: meal prep, cleaning, laundry, shopping, community activity, and yard work  PERSONAL FACTORS: Time since onset of injury/illness/exacerbation and 1-2 comorbidities: prior lumbar surgery, OP are also affecting patient's functional outcome.   REHAB POTENTIAL: Fair plus/good minus.  CLINICAL DECISION MAKING: Evolving/moderate complexity  EVALUATION COMPLEXITY: Moderate   GOALS:  SHORT TERM GOALS: Target date: 12/03/23.  Ind with an initial HEP. Goal status: MET  LONG TERM GOALS: Target date: 02/17/24.  Patient stand 15-20 minutes with left lower extremity pain not > 3/10.  Goal status: INITIAL  2.  Walk a community distance with left lower extremity pain not > 3/10.   Goal status: INITIAL  3.  Perform ADL's with left lower extremity pain not > 3/10.   Goal status: INITIAL   PLAN:  PT FREQUENCY: 2x/week  PT DURATION: 6 weeks  PLANNED INTERVENTIONS:  97110-Therapeutic exercises, 97530- Therapeutic activity, [id number]- Neuromuscular re-education, 97535- Self Care, 02859- Manual therapy, [id number]- Electrical stimulation (unattended), 97035- Ultrasound, Patient/Family education, and Moist heat.  PLAN FOR NEXT SESSION:       Core exercise progression, modalities and STW/M as needed.     Tajanae Guilbault,CHRIS, PTA 11/28/2023, 9:28 AM

## 2023-12-03 ENCOUNTER — Encounter: Payer: Self-pay | Admitting: *Deleted

## 2023-12-03 ENCOUNTER — Ambulatory Visit: Admitting: *Deleted

## 2023-12-03 DIAGNOSIS — M5459 Other low back pain: Secondary | ICD-10-CM | POA: Diagnosis not present

## 2023-12-03 DIAGNOSIS — M6281 Muscle weakness (generalized): Secondary | ICD-10-CM

## 2023-12-03 DIAGNOSIS — M5416 Radiculopathy, lumbar region: Secondary | ICD-10-CM | POA: Diagnosis not present

## 2023-12-03 DIAGNOSIS — M62830 Muscle spasm of back: Secondary | ICD-10-CM | POA: Diagnosis not present

## 2023-12-03 DIAGNOSIS — R293 Abnormal posture: Secondary | ICD-10-CM

## 2023-12-03 NOTE — Therapy (Signed)
 OUTPATIENT PHYSICAL THERAPY THORACOLUMBAR TREATMENT   Patient Name: Cheryl Lee MRN: [id number] DOB:[birth year], 78 y.o., female Today's Date: 12/03/2023  END OF SESSION:  PT End of Session - 12/03/23 0945     Visit Number 5    Number of Visits 12    Date for PT Re-Evaluation 02/17/24    PT Start Time 0930    PT Stop Time 1020    PT Time Calculation (min) 50 min          Past Medical History:  Diagnosis Date   Acid reflux    Arthritis    Dyspnea    HNP (herniated nucleus pulposus), lumbar    Hypercholesteremia    Hypertension    Osteoporosis    Sinus congestion    Past Surgical History:  Procedure Laterality Date   BIOPSY  08/23/2022   Procedure: BIOPSY;  Surgeon: Wilhelmenia Aloha Raddle., MD;  Location: [facility];  Service: Gastroenterology;;   CATARACT EXTRACTION     CHOLECYSTECTOMY N/A 08/23/2022   Procedure: LAPAROSCOPIC CHOLECYSTECTOMY;  Surgeon: Dasie Leonor CROME, MD;  Location: [facility];  Service: General;  Laterality: N/A;   COLONOSCOPY     COLONOSCOPY N/A 01/21/2020   Procedure: COLONOSCOPY;  Surgeon: Golda Claudis PENNER, MD;  [facility]: [facility];  Service: Endoscopy;  Laterality: N/A;  730   ERCP N/A 08/23/2022   Procedure: ENDOSCOPIC RETROGRADE CHOLANGIOPANCREATOGRAPHY (ERCP);  Surgeon: Wilhelmenia Aloha Raddle., MD;  Location: [facility];  Service: Gastroenterology;  Laterality: N/A;   LUMBAR LAMINECTOMY/DECOMPRESSION MICRODISCECTOMY Left 05/01/2017   Procedure: Microdiscectomy - Lumbar four-five  - left;  Surgeon: Onetha Kuba, MD;  Location: [facility];  Service: Neurosurgery;  Laterality: Left;   POLYPECTOMY  01/21/2020   Procedure: POLYPECTOMY;  Surgeon: Golda Claudis PENNER, MD;  [facility];  Service: Endoscopy;;   REMOVAL OF STONES  08/23/2022   Procedure: REMOVAL OF STONES;  Surgeon: Wilhelmenia Aloha Raddle., MD;  Location: [facility];  Service: Gastroenterology;;   Right snkle repair     SPHINCTEROTOMY  08/23/2022   Procedure: ANNETT;  Surgeon: Mansouraty,  Aloha Raddle., MD;  Location: [facility];  Service: Gastroenterology;;   Patient Active Problem List   Diagnosis Date Noted   Lumbar compression fracture (HCC) 07/05/2022   Leukocytosis 07/05/2022   Stage 3a chronic kidney disease (CKD) (HCC) 07/05/2022   Lumbar nerve root impingement 07/08/2018   DDD (degenerative disc disease), lumbar 07/08/2018   Vitamin D  deficiency 03/03/2018   HNP (herniated nucleus pulposus), lumbar 05/01/2017   Overweight (BMI 25.0-29.9) 12/21/2014   GERD (gastroesophageal reflux disease)    Hyperlipidemia with target LDL less than 100    Hypertension    Osteoporosis    REFERRING PROVIDER: Radiculopathy, lumbar region.    REFERRING DIAG: Radiculopathy, lumbar region.  Rationale for Evaluation and Treatment: Rehabilitation  THERAPY DIAG:  Other low back pain  Abnormal posture  Radiculopathy, lumbar region  Muscle weakness (generalized)  Muscle spasm of back  ONSET DATE: Over a year ago.'  SUBJECTIVE:  SUBJECTIVE STATEMENT: LBP is about the same. TO MD 12-13-23  PERTINENT HISTORY:  Please see above.  OP.  Prior lumbar surgery.  PAIN:  Are you having pain? Yes: NPRS scale: 4-5/10. Pain location: Left LE. Pain description: Soreness, burning, stinging and achy. Aggravating factors: As above. Relieving factors: As above.  PRECAUTIONS: Other: OP.  Avoid spinal loading.  Chronic L2 compression fracture with 33% height loss   RED FLAGS: None   WEIGHT BEARING RESTRICTIONS: No  FALLS:  Has patient fallen in last 6 months? No  LIVING ENVIRONMENT: Lives in: House/apartment Has following equipment at home: Quad cane.  OCCUPATION: Retired.    PLOF: Independent with basic ADLs  PATIENT GOALS: Walk and stand without left LE pain.   OBJECTIVE:  Note: Objective  measures were completed at Evaluation unless otherwise noted.  DIAGNOSTICS:  Both hips are normally located. Mild degenerative changes but no fracture or AVN. The pubic symphysis and SI joints are intact. No pelvic fractures or bone lesions. Advanced degenerative disc disease noted at L4-5.   IMPRESSION: 1. Mild degenerative changes but no acute bony findings. 2. Advanced degenerative disc disease at L4-5.  Lumbar MRI: 08/29/2023  -severe deg. disc disease  -L4-5, left post-lateral disc protrusion causing left lateral recess and moderate central canal stenosis with displacement of the left L5 and S1 nerve roots  -L3-4 small right paracentral disc protrusion causing mod right and mild central canal stenosis  -spinal stenosis, L1-2 (mild); L2-3 (mild)  -facet arthropathy, L4-5, L5-S1  -chronic L2 compression fracture with 33% height loss    PATIENT SURVEYS:  Modified Owestry:  22/50.  POSTURE: rounded shoulders, forward head, decreased lumbar lordosis, and flexed trunk , kyphosis, scoliosis.    PALPATION: Minimal tenderness in left lower lumbar region. Increased tenderness over left SIJ region.     LUMBAR ROM:   Patient can achieve an upright posture.    LOWER EXTREMITY ROM:     WNL.  LOWER EXTREMITY MMT:    Bilateral hip flexion is a solid 4 to 4+/5.  Hip abduction is normal.  Patient able to provide a normal strength grade via MMTing for bilateral hip and ankle strength.  LUMBAR SPECIAL TESTS:  Some pain reported with bilateral SLR testing.  Equal leg lengths.  (-) FABER testing.  Patellar reflexes are normal.  Bilateral Achilles are tarce.   GAIT: Patient walks safely with a quad cane.  She walks in a flexed trunk posture and tends to lean away from the painful side.  TREATMENT DATE:   12/03/23:  Nustep level 1 x 18 minutes STW/M x 14  minuets to patient's left LB paras and SIJ/ upper glute with patient in RT sdly position with folded pillow between knee IFC at  80-150 Hz on 40% scan x 20 minutes    11-21-23 Nustep L3 x 16 mins Manual STW/TPR to LT LB paras, QL, SIJ and glute while seated leaning forward on plinth and pillows IFC at 80-150 Hz on 40% scan x 15 minutes to patient's left lower lumbar/SIJ region.  Seated    Normal modality response following removal of modality.  PATIENT EDUCATION:  Education details:  Person educated:  International aid/development worker:  Education comprehension:   HOME EXERCISE PROGRAM:   ASSESSMENT:  CLINICAL IMPRESSION: Patient arrived to clinic doing about the same with LT side LBP. She was able to perform exs without increased pain. Manual STW to LT side upper glute, SIJ, and LB paras with Pt in RT side lying. Some decreased pain/ soreness end of session and was able to walk better .  OBJECTIVE IMPAIRMENTS: Abnormal gait, decreased activity tolerance, decreased strength, increased muscle spasms, postural dysfunction, and pain.   ACTIVITY LIMITATIONS: carrying, lifting, bending, standing, sleeping, and locomotion level  PARTICIPATION LIMITATIONS: meal prep, cleaning, laundry, shopping, community activity, and yard work  PERSONAL FACTORS: Time since onset of injury/illness/exacerbation and 1-2 comorbidities: prior lumbar surgery, OP are also affecting patient's functional outcome.   REHAB POTENTIAL: Fair plus/good minus.  CLINICAL DECISION MAKING: Evolving/moderate complexity  EVALUATION COMPLEXITY: Moderate   GOALS:  SHORT TERM GOALS: Target date: 12/03/23.  Ind with an initial HEP. Goal status: MET  LONG TERM GOALS: Target date: 02/17/24.  Patient stand 15-20 minutes with left lower extremity pain not > 3/10.  Goal status: On going  2.  Walk a community distance with left lower extremity pain not > 3/10.   Goal status: On going  3.  Perform ADL's with left lower extremity pain not >  3/10.   Goal status: On going   PLAN:  PT FREQUENCY: 2x/week  PT DURATION: 6 weeks  PLANNED INTERVENTIONS: 97110-Therapeutic exercises, 97530- Therapeutic activity, [id number]- Neuromuscular re-education, 97535- Self Care, 02859- Manual therapy, [id number]- Electrical stimulation (unattended), 97035- Ultrasound, Patient/Family education, and Moist heat.  PLAN FOR NEXT SESSION:       Core exercise progression, modalities and STW/M as needed.     Mahlani Berninger,CHRIS, PTA 12/03/2023, 1:16 PM

## 2023-12-05 ENCOUNTER — Ambulatory Visit: Admitting: *Deleted

## 2023-12-05 ENCOUNTER — Encounter: Payer: Self-pay | Admitting: *Deleted

## 2023-12-05 DIAGNOSIS — M6281 Muscle weakness (generalized): Secondary | ICD-10-CM

## 2023-12-05 DIAGNOSIS — M5459 Other low back pain: Secondary | ICD-10-CM

## 2023-12-05 DIAGNOSIS — R293 Abnormal posture: Secondary | ICD-10-CM

## 2023-12-05 DIAGNOSIS — M5416 Radiculopathy, lumbar region: Secondary | ICD-10-CM

## 2023-12-05 DIAGNOSIS — M62830 Muscle spasm of back: Secondary | ICD-10-CM | POA: Diagnosis not present

## 2023-12-05 NOTE — Therapy (Signed)
 OUTPATIENT PHYSICAL THERAPY THORACOLUMBAR TREATMENT   Patient Name: Cheryl Lee MRN: [id number] DOB:09/27/1945, 78 y.o., female Today's Date: 12/05/2023  END OF SESSION:  PT End of Session - 12/05/23 1403     Visit Number 6    Number of Visits 12    Date for PT Re-Evaluation 02/17/24    PT Start Time 1300    PT Stop Time 1350    PT Time Calculation (min) 50 min          Past Medical History:  Diagnosis Date   Acid reflux    Arthritis    Dyspnea    HNP (herniated nucleus pulposus), lumbar    Hypercholesteremia    Hypertension    Osteoporosis    Sinus congestion    Past Surgical History:  Procedure Laterality Date   BIOPSY  08/23/2022   Procedure: BIOPSY;  Surgeon: Wilhelmenia Aloha Raddle., MD;  Location: [facility];  Service: Gastroenterology;;   CATARACT EXTRACTION     CHOLECYSTECTOMY N/A 08/23/2022   Procedure: LAPAROSCOPIC CHOLECYSTECTOMY;  Surgeon: Dasie Leonor CROME, MD;  Location: [facility];  Service: General;  Laterality: N/A;   COLONOSCOPY     COLONOSCOPY N/A 01/21/2020   Procedure: COLONOSCOPY;  Surgeon: Golda Claudis PENNER, MD;  [facility]: [facility];  Service: Endoscopy;  Laterality: N/A;  730   ERCP N/A 08/23/2022   Procedure: ENDOSCOPIC RETROGRADE CHOLANGIOPANCREATOGRAPHY (ERCP);  Surgeon: Wilhelmenia Aloha Raddle., MD;  Location: [facility];  Service: Gastroenterology;  Laterality: N/A;   LUMBAR LAMINECTOMY/DECOMPRESSION MICRODISCECTOMY Left 05/01/2017   Procedure: Microdiscectomy - Lumbar four-five  - left;  Surgeon: Onetha Kuba, MD;  Location: [facility];  Service: Neurosurgery;  Laterality: Left;   POLYPECTOMY  01/21/2020   Procedure: POLYPECTOMY;  Surgeon: Golda Claudis PENNER, MD;  [facility];  Service: Endoscopy;;   REMOVAL OF STONES  08/23/2022   Procedure: REMOVAL OF STONES;  Surgeon: Wilhelmenia Aloha Raddle., MD;  Location: [facility];  Service: Gastroenterology;;   Right snkle repair     SPHINCTEROTOMY  08/23/2022   Procedure: ANNETT;  Surgeon: Mansouraty,  Aloha Raddle., MD;  Location: [facility];  Service: Gastroenterology;;   Patient Active Problem List   Diagnosis Date Noted   Lumbar compression fracture (HCC) 07/05/2022   Leukocytosis 07/05/2022   Stage 3a chronic kidney disease (CKD) (HCC) 07/05/2022   Lumbar nerve root impingement 07/08/2018   DDD (degenerative disc disease), lumbar 07/08/2018   Vitamin D  deficiency 03/03/2018   HNP (herniated nucleus pulposus), lumbar 05/01/2017   Overweight (BMI 25.0-29.9) 12/21/2014   GERD (gastroesophageal reflux disease)    Hyperlipidemia with target LDL less than 100    Hypertension    Osteoporosis    REFERRING PROVIDER: Radiculopathy, lumbar region.    REFERRING DIAG: Radiculopathy, lumbar region.  Rationale for Evaluation and Treatment: Rehabilitation  THERAPY DIAG:  Other low back pain  Abnormal posture  Radiculopathy, lumbar region  Muscle weakness (generalized)  Muscle spasm of back  ONSET DATE: Over a year ago.'  SUBJECTIVE:  SUBJECTIVE STATEMENT: LBP is the worst when walking. TO MD 12-13-23. Chiropractor consult on Monday  PERTINENT HISTORY:  Please see above.  OP.  Prior lumbar surgery.  PAIN:  Are you having pain? Yes: NPRS scale: 4-5/10. Pain location: Left LE. Pain description: Soreness, burning, stinging and achy. Aggravating factors: As above. Relieving factors: As above.  PRECAUTIONS: Other: OP.  Avoid spinal loading.  Chronic L2 compression fracture with 33% height loss   RED FLAGS: None   WEIGHT BEARING RESTRICTIONS: No  FALLS:  Has patient fallen in last 6 months? No  LIVING ENVIRONMENT: Lives in: House/apartment Has following equipment at home: Quad cane.  OCCUPATION: Retired.    PLOF: Independent with basic ADLs  PATIENT GOALS: Walk and stand without left LE  pain.   OBJECTIVE:  Note: Objective measures were completed at Evaluation unless otherwise noted.  DIAGNOSTICS:  Both hips are normally located. Mild degenerative changes but no fracture or AVN. The pubic symphysis and SI joints are intact. No pelvic fractures or bone lesions. Advanced degenerative disc disease noted at L4-5.   IMPRESSION: 1. Mild degenerative changes but no acute bony findings. 2. Advanced degenerative disc disease at L4-5.  Lumbar MRI: 08/29/2023  -severe deg. disc disease  -L4-5, left post-lateral disc protrusion causing left lateral recess and moderate central canal stenosis with displacement of the left L5 and S1 nerve roots  -L3-4 small right paracentral disc protrusion causing mod right and mild central canal stenosis  -spinal stenosis, L1-2 (mild); L2-3 (mild)  -facet arthropathy, L4-5, L5-S1  -chronic L2 compression fracture with 33% height loss    PATIENT SURVEYS:  Modified Owestry:  22/50.  POSTURE: rounded shoulders, forward head, decreased lumbar lordosis, and flexed trunk , kyphosis, scoliosis.    PALPATION: Minimal tenderness in left lower lumbar region. Increased tenderness over left SIJ region.     LUMBAR ROM:   Patient can achieve an upright posture.    LOWER EXTREMITY ROM:     WNL.  LOWER EXTREMITY MMT:    Bilateral hip flexion is a solid 4 to 4+/5.  Hip abduction is normal.  Patient able to provide a normal strength grade via MMTing for bilateral hip and ankle strength.  LUMBAR SPECIAL TESTS:  Some pain reported with bilateral SLR testing.  Equal leg lengths.  (-) FABER testing.  Patellar reflexes are normal.  Bilateral Achilles are tarce.   GAIT: Patient walks safely with a quad cane.  She walks in a flexed trunk posture and tends to lean away from the painful side.  TREATMENT DATE:   12/03/23:  Nustep level 3 x 17 minutes STW/M x 14  minuets to patient's left LB paras and SIJ/ upper glute with patient in RT sdly position over  small bolster for positional traction during STW and Estim.  IFC at 80-150 Hz on 40% scan x 16 minutes    11-21-23 Nustep L3 x 16 mins Manual STW/TPR to LT LB paras, QL, SIJ and glute while seated leaning forward on plinth and pillows IFC at 80-150 Hz on 40% scan x 15 minutes to patient's left lower lumbar/SIJ region.  Seated    Normal modality response following removal of modality.  PATIENT EDUCATION:  Education details:  Person educated:  International aid/development worker:  Education comprehension:   HOME EXERCISE PROGRAM:   ASSESSMENT:  CLINICAL IMPRESSION: Patient arrived to clinic doing about the same with LT side LBP. She was able to perform exs without increased pain. Manual STW to LT side upper glute, SIJ, and LB paras with Pt in RT side lying over bolster for positional traction. Some decreased pain/ soreness end of session.   OBJECTIVE IMPAIRMENTS: Abnormal gait, decreased activity tolerance, decreased strength, increased muscle spasms, postural dysfunction, and pain.   ACTIVITY LIMITATIONS: carrying, lifting, bending, standing, sleeping, and locomotion level  PARTICIPATION LIMITATIONS: meal prep, cleaning, laundry, shopping, community activity, and yard work  PERSONAL FACTORS: Time since onset of injury/illness/exacerbation and 1-2 comorbidities: prior lumbar surgery, OP are also affecting patient's functional outcome.   REHAB POTENTIAL: Fair plus/good minus.  CLINICAL DECISION MAKING: Evolving/moderate complexity  EVALUATION COMPLEXITY: Moderate   GOALS:  SHORT TERM GOALS: Target date: 12/03/23.  Ind with an initial HEP. Goal status: MET  LONG TERM GOALS: Target date: 02/17/24.  Patient stand 15-20 minutes with left lower extremity pain not > 3/10.  Goal status: On going  2.  Walk a community distance with left lower extremity pain not > 3/10.   Goal  status: On going  3.  Perform ADL's with left lower extremity pain not > 3/10.   Goal status: On going   PLAN:  PT FREQUENCY: 2x/week  PT DURATION: 6 weeks  PLANNED INTERVENTIONS: 97110-Therapeutic exercises, 97530- Therapeutic activity, [id number]- Neuromuscular re-education, 97535- Self Care, 02859- Manual therapy, G0283- Electrical stimulation (unattended), 97035- Ultrasound, Patient/Family education, and Moist heat.  PLAN FOR NEXT SESSION:       Core exercise progression, modalities and STW/M as needed.     Chris Cripps,CHRIS, PTA 12/05/2023, 6:25 PM

## 2023-12-10 ENCOUNTER — Ambulatory Visit

## 2023-12-10 DIAGNOSIS — M5459 Other low back pain: Secondary | ICD-10-CM | POA: Diagnosis not present

## 2023-12-10 DIAGNOSIS — M5416 Radiculopathy, lumbar region: Secondary | ICD-10-CM

## 2023-12-10 DIAGNOSIS — R293 Abnormal posture: Secondary | ICD-10-CM

## 2023-12-10 DIAGNOSIS — M6281 Muscle weakness (generalized): Secondary | ICD-10-CM | POA: Diagnosis not present

## 2023-12-10 DIAGNOSIS — M62830 Muscle spasm of back: Secondary | ICD-10-CM

## 2023-12-10 NOTE — Therapy (Signed)
 OUTPATIENT PHYSICAL THERAPY THORACOLUMBAR TREATMENT   Patient Name: Cheryl Lee MRN: [id number] DOB:06-Apr-1946, 78 y.o., female Today's Date: 12/10/2023  END OF SESSION:  PT End of Session - 12/10/23 1529     Visit Number 7    Number of Visits 12    Date for PT Re-Evaluation 02/17/24    PT Start Time 1515    PT Stop Time 1609    PT Time Calculation (min) 54 min          Past Medical History:  Diagnosis Date   Acid reflux    Arthritis    Dyspnea    HNP (herniated nucleus pulposus), lumbar    Hypercholesteremia    Hypertension    Osteoporosis    Sinus congestion    Past Surgical History:  Procedure Laterality Date   BIOPSY  08/23/2022   Procedure: BIOPSY;  Surgeon: Wilhelmenia Aloha Raddle., MD;  Location: [facility];  Service: Gastroenterology;;   CATARACT EXTRACTION     CHOLECYSTECTOMY N/A 08/23/2022   Procedure: LAPAROSCOPIC CHOLECYSTECTOMY;  Surgeon: Dasie Leonor CROME, MD;  Location: [facility];  Service: General;  Laterality: N/A;   COLONOSCOPY     COLONOSCOPY N/A 01/21/2020   Procedure: COLONOSCOPY;  Surgeon: Golda Claudis PENNER, MD;  [facility];  Service: Endoscopy;  Laterality: N/A;  730   ERCP N/A 08/23/2022   Procedure: ENDOSCOPIC RETROGRADE CHOLANGIOPANCREATOGRAPHY (ERCP);  Surgeon: Wilhelmenia Aloha Raddle., MD;  Location: [facility];  Service: Gastroenterology;  Laterality: N/A;   LUMBAR LAMINECTOMY/DECOMPRESSION MICRODISCECTOMY Left 05/01/2017   Procedure: Microdiscectomy - Lumbar four-five  - left;  Surgeon: Onetha Kuba, MD;  Location: [facility];  Service: Neurosurgery;  Laterality: Left;   POLYPECTOMY  01/21/2020   Procedure: POLYPECTOMY;  Surgeon: Golda Claudis PENNER, MD;  [facility];  Service: Endoscopy;;   REMOVAL OF STONES  08/23/2022   Procedure: REMOVAL OF STONES;  Surgeon: Wilhelmenia Aloha Raddle., MD;  Location: [facility];  Service: Gastroenterology;;   Right snkle repair     SPHINCTEROTOMY  08/23/2022   Procedure: ANNETT;  Surgeon: Mansouraty,  Aloha Raddle., MD;  Location: [facility];  Service: Gastroenterology;;   Patient Active Problem List   Diagnosis Date Noted   Lumbar compression fracture (HCC) 07/05/2022   Leukocytosis 07/05/2022   Stage 3a chronic kidney disease (CKD) (HCC) 07/05/2022   Lumbar nerve root impingement 07/08/2018   DDD (degenerative disc disease), lumbar 07/08/2018   Vitamin D  deficiency 03/03/2018   HNP (herniated nucleus pulposus), lumbar 05/01/2017   Overweight (BMI 25.0-29.9) 12/21/2014   GERD (gastroesophageal reflux disease)    Hyperlipidemia with target LDL less than 100    Hypertension    Osteoporosis    REFERRING PROVIDER: Radiculopathy, lumbar region.    REFERRING DIAG: Radiculopathy, lumbar region.  Rationale for Evaluation and Treatment: Rehabilitation  THERAPY DIAG:  Other low back pain  Abnormal posture  Radiculopathy, lumbar region  Muscle weakness (generalized)  Muscle spasm of back  ONSET DATE: Over a year ago.'  SUBJECTIVE:  SUBJECTIVE STATEMENT:  Pt reports 4-5/10 low back and left hip/LE pain.  Pt has financial meeting with chiropractor tomorrow.   PERTINENT HISTORY:  Please see above.  OP.  Prior lumbar surgery.  PAIN:  Are you having pain? Yes: NPRS scale: 4-5/10. Pain location: Left LE. Pain description: Soreness, burning, stinging and achy. Aggravating factors: As above. Relieving factors: As above.  PRECAUTIONS: Other: OP.  Avoid spinal loading.  Chronic L2 compression fracture with 33% height loss   RED FLAGS: None   WEIGHT BEARING RESTRICTIONS: No  FALLS:  Has patient fallen in last 6 months? No  LIVING ENVIRONMENT: Lives in: House/apartment Has following equipment at home: Quad cane.  OCCUPATION: Retired.    PLOF: Independent with basic ADLs  PATIENT GOALS: Walk  and stand without left LE pain.   OBJECTIVE:  Note: Objective measures were completed at Evaluation unless otherwise noted.  DIAGNOSTICS:  Both hips are normally located. Mild degenerative changes but no fracture or AVN. The pubic symphysis and SI joints are intact. No pelvic fractures or bone lesions. Advanced degenerative disc disease noted at L4-5.   IMPRESSION: 1. Mild degenerative changes but no acute bony findings. 2. Advanced degenerative disc disease at L4-5.  Lumbar MRI: 08/29/2023  -severe deg. disc disease  -L4-5, left post-lateral disc protrusion causing left lateral recess and moderate central canal stenosis with displacement of the left L5 and S1 nerve roots  -L3-4 small right paracentral disc protrusion causing mod right and mild central canal stenosis  -spinal stenosis, L1-2 (mild); L2-3 (mild)  -facet arthropathy, L4-5, L5-S1  -chronic L2 compression fracture with 33% height loss    PATIENT SURVEYS:  Modified Owestry:  22/50.  POSTURE: rounded shoulders, forward head, decreased lumbar lordosis, and flexed trunk , kyphosis, scoliosis.    PALPATION: Minimal tenderness in left lower lumbar region. Increased tenderness over left SIJ region.     LUMBAR ROM:   Patient can achieve an upright posture.    LOWER EXTREMITY ROM:     WNL.  LOWER EXTREMITY MMT:    Bilateral hip flexion is a solid 4 to 4+/5.  Hip abduction is normal.  Patient able to provide a normal strength grade via MMTing for bilateral hip and ankle strength.  LUMBAR SPECIAL TESTS:  Some pain reported with bilateral SLR testing.  Equal leg lengths.  (-) FABER testing.  Patellar reflexes are normal.  Bilateral Achilles are tarce.   GAIT: Patient walks safely with a quad cane.  She walks in a flexed trunk posture and tends to lean away from the painful side.  TREATMENT DATE:   12/10/23:   Nustep level 3 x 17 minutes STW/M  to patient's left LB paras and SIJ/ upper glute with patient in RT  sdly position over small bolster for positional traction during STW and Estim.  IFC at 80-150 Hz on 40% scan x 20 minutes    11-21-23 Nustep L3 x 16 mins Manual STW/TPR to LT LB paras, QL, SIJ and glute while seated leaning forward on plinth and pillows IFC at 80-150 Hz on 40% scan x 15 minutes to patient's left lower lumbar/SIJ region.  Seated    Normal modality response following removal of modality.  PATIENT EDUCATION:  Education details:  Person educated:  International aid/development worker:  Education comprehension:   HOME EXERCISE PROGRAM:   ASSESSMENT:  CLINICAL IMPRESSION: Pt arrives for today's treatment session reporting 4-5/10 low back and left LE/hip pain.  Pt states that she has a Research scientist (medical) with chiropractor tomorrow and will call the facility to lets us  know what she plans to do.  STW/M performed to left hip and glut musculature to decrease pain and tone.   Normal responses to estim noted upon removal.  Pt reported 3/10 left hip/LE pain at completion of today's treatment session.   OBJECTIVE IMPAIRMENTS: Abnormal gait, decreased activity tolerance, decreased strength, increased muscle spasms, postural dysfunction, and pain.   ACTIVITY LIMITATIONS: carrying, lifting, bending, standing, sleeping, and locomotion level  PARTICIPATION LIMITATIONS: meal prep, cleaning, laundry, shopping, community activity, and yard work  PERSONAL FACTORS: Time since onset of injury/illness/exacerbation and 1-2 comorbidities: prior lumbar surgery, OP are also affecting patient's functional outcome.   REHAB POTENTIAL: Fair plus/good minus.  CLINICAL DECISION MAKING: Evolving/moderate complexity  EVALUATION COMPLEXITY: Moderate   GOALS:  SHORT TERM GOALS: Target date: 12/03/23.  Ind with an initial HEP. Goal status: MET  LONG TERM GOALS: Target date: 02/17/24.  Patient  stand 15-20 minutes with left lower extremity pain not > 3/10.  Goal status: On going  2.  Walk a community distance with left lower extremity pain not > 3/10.   Goal status: On going  3.  Perform ADL's with left lower extremity pain not > 3/10.   Goal status: On going   PLAN:  PT FREQUENCY: 2x/week  PT DURATION: 6 weeks  PLANNED INTERVENTIONS: 97110-Therapeutic exercises, 97530- Therapeutic activity, [id number]- Neuromuscular re-education, 97535- Self Care, 02859- Manual therapy, G0283- Electrical stimulation (unattended), 97035- Ultrasound, Patient/Family education, and Moist heat.  PLAN FOR NEXT SESSION:       Core exercise progression, modalities and STW/M as needed.     Delon DELENA Gosling, PTA 12/10/2023, 4:14 PM

## 2023-12-11 DIAGNOSIS — M9903 Segmental and somatic dysfunction of lumbar region: Secondary | ICD-10-CM | POA: Diagnosis not present

## 2023-12-11 DIAGNOSIS — M51361 Other intervertebral disc degeneration, lumbar region with lower extremity pain only: Secondary | ICD-10-CM | POA: Diagnosis not present

## 2023-12-12 ENCOUNTER — Ambulatory Visit: Admitting: Physical Therapy

## 2023-12-12 DIAGNOSIS — M5416 Radiculopathy, lumbar region: Secondary | ICD-10-CM | POA: Diagnosis not present

## 2023-12-12 DIAGNOSIS — M5459 Other low back pain: Secondary | ICD-10-CM

## 2023-12-12 DIAGNOSIS — M6281 Muscle weakness (generalized): Secondary | ICD-10-CM | POA: Diagnosis not present

## 2023-12-12 DIAGNOSIS — M9903 Segmental and somatic dysfunction of lumbar region: Secondary | ICD-10-CM | POA: Diagnosis not present

## 2023-12-12 DIAGNOSIS — M62830 Muscle spasm of back: Secondary | ICD-10-CM | POA: Diagnosis not present

## 2023-12-12 DIAGNOSIS — R293 Abnormal posture: Secondary | ICD-10-CM

## 2023-12-12 DIAGNOSIS — M51361 Other intervertebral disc degeneration, lumbar region with lower extremity pain only: Secondary | ICD-10-CM | POA: Diagnosis not present

## 2023-12-12 NOTE — Therapy (Signed)
 OUTPATIENT PHYSICAL THERAPY THORACOLUMBAR TREATMENT   Patient Name: Cheryl Lee MRN: [id number] DOB:01/10/1946, 78 y.o., female Today's Date: 12/12/2023  END OF SESSION:  PT End of Session - 12/12/23 0908     Visit Number 8    Number of Visits 12    Date for PT Re-Evaluation 02/17/24    PT Start Time 0800    PT Stop Time 0850    PT Time Calculation (min) 50 min    Activity Tolerance Patient tolerated treatment well    Behavior During Therapy [facility] for tasks assessed/performed          Past Medical History:  Diagnosis Date   Acid reflux    Arthritis    Dyspnea    HNP (herniated nucleus pulposus), lumbar    Hypercholesteremia    Hypertension    Osteoporosis    Sinus congestion    Past Surgical History:  Procedure Laterality Date   BIOPSY  08/23/2022   Procedure: BIOPSY;  Surgeon: Wilhelmenia Aloha Raddle., MD;  Location: [facility];  Service: Gastroenterology;;   CATARACT EXTRACTION     CHOLECYSTECTOMY N/A 08/23/2022   Procedure: LAPAROSCOPIC CHOLECYSTECTOMY;  Surgeon: Dasie Leonor CROME, MD;  Location: [facility];  Service: General;  Laterality: N/A;   COLONOSCOPY     COLONOSCOPY N/A 01/21/2020   Procedure: COLONOSCOPY;  Surgeon: Golda Claudis PENNER, MD;  Location: [facility];  Service: Endoscopy;  Laterality: N/A;  730   ERCP N/A 08/23/2022   Procedure: ENDOSCOPIC RETROGRADE CHOLANGIOPANCREATOGRAPHY (ERCP);  Surgeon: Wilhelmenia Aloha Raddle., MD;  Location: [facility];  Service: Gastroenterology;  Laterality: N/A;   LUMBAR LAMINECTOMY/DECOMPRESSION MICRODISCECTOMY Left 05/01/2017   Procedure: Microdiscectomy - Lumbar four-five  - left;  Surgeon: Onetha Kuba, MD;  Location: [facility];  Service: Neurosurgery;  Laterality: Left;   POLYPECTOMY  01/21/2020   Procedure: POLYPECTOMY;  Surgeon: Golda Claudis PENNER, MD;  Location: [facility];  Service: Endoscopy;;   REMOVAL OF STONES  08/23/2022   Procedure: REMOVAL OF STONES;  Surgeon: Wilhelmenia Aloha Raddle., MD;  Location: [facility];  Service:  Gastroenterology;;   Right snkle repair     SPHINCTEROTOMY  08/23/2022   Procedure: ANNETT;  Surgeon: Mansouraty, Aloha Raddle., MD;  Location: [facility];  Service: Gastroenterology;;   Patient Active Problem List   Diagnosis Date Noted   Lumbar compression fracture (HCC) 07/05/2022   Leukocytosis 07/05/2022   Stage 3a chronic kidney disease (CKD) (HCC) 07/05/2022   Lumbar nerve root impingement 07/08/2018   DDD (degenerative disc disease), lumbar 07/08/2018   Vitamin D  deficiency 03/03/2018   HNP (herniated nucleus pulposus), lumbar 05/01/2017   Overweight (BMI 25.0-29.9) 12/21/2014   GERD (gastroesophageal reflux disease)    Hyperlipidemia with target LDL less than 100    Hypertension    Osteoporosis    REFERRING PROVIDER: Radiculopathy, lumbar region.    REFERRING DIAG: Radiculopathy, lumbar region.  Rationale for Evaluation and Treatment: Rehabilitation  THERAPY DIAG:  Other low back pain  Abnormal posture  ONSET DATE: Over a year ago.'  SUBJECTIVE:  SUBJECTIVE STATEMENT:  About the same.  Going into for an extended period of Chiropractic treatments.   PERTINENT HISTORY:  Please see above.  OP.  Prior lumbar surgery.  PAIN:  Are you having pain? Yes: NPRS scale: 4-5/10. Pain location: Left LE. Pain description: Soreness, burning, stinging and achy. Aggravating factors: As above. Relieving factors: As above.  PRECAUTIONS: Other: OP.  Avoid spinal loading.  Chronic L2 compression fracture with 33% height loss   RED FLAGS: None   WEIGHT BEARING RESTRICTIONS: No  FALLS:  Has patient fallen in last 6 months? No  LIVING ENVIRONMENT: Lives in: House/apartment Has following equipment at home: Quad cane.  OCCUPATION: Retired.    PLOF: Independent with basic ADLs  PATIENT  GOALS: Walk and stand without left LE pain.   OBJECTIVE:  Note: Objective measures were completed at Evaluation unless otherwise noted.  DIAGNOSTICS:  Both hips are normally located. Mild degenerative changes but no fracture or AVN. The pubic symphysis and SI joints are intact. No pelvic fractures or bone lesions. Advanced degenerative disc disease noted at L4-5.   IMPRESSION: 1. Mild degenerative changes but no acute bony findings. 2. Advanced degenerative disc disease at L4-5.  Lumbar MRI: 08/29/2023  -severe deg. disc disease  -L4-5, left post-lateral disc protrusion causing left lateral recess and moderate central canal stenosis with displacement of the left L5 and S1 nerve roots  -L3-4 small right paracentral disc protrusion causing mod right and mild central canal stenosis  -spinal stenosis, L1-2 (mild); L2-3 (mild)  -facet arthropathy, L4-5, L5-S1  -chronic L2 compression fracture with 33% height loss    PATIENT SURVEYS:  Modified Owestry:  22/50.  POSTURE: rounded shoulders, forward head, decreased lumbar lordosis, and flexed trunk , kyphosis, scoliosis.    PALPATION: Minimal tenderness in left lower lumbar region. Increased tenderness over left SIJ region.     LUMBAR ROM:   Patient can achieve an upright posture.    LOWER EXTREMITY ROM:     WNL.  LOWER EXTREMITY MMT:    Bilateral hip flexion is a solid 4 to 4+/5.  Hip abduction is normal.  Patient able to provide a normal strength grade via MMTing for bilateral hip and ankle strength.  LUMBAR SPECIAL TESTS:  Some pain reported with bilateral SLR testing.  Equal leg lengths.  (-) FABER testing.  Patellar reflexes are normal.  Bilateral Achilles are tarce.   GAIT: Patient walks safely with a quad cane.  She walks in a flexed trunk posture and tends to lean away from the painful side.  TREATMENT DATE:   12/12/23:  Nustep level 3 x 15 minutes then in supine 1-1 stretching into SKTC (both sides) 2 minute hold,  DKTC (2 minutes) and bilateral trunk rotation (2 minute each way) f/b IFC at 80-150 Hz on 40% scan x 20 minutes.  Normal modality response following removal of modality     12/10/23:   Nustep level 3 x 17 minutes STW/M  to patient's left LB paras and SIJ/ upper glute with patient in RT sdly position over small bolster for positional traction during STW and Estim.  IFC at 80-150 Hz on 40% scan x 20 minutes       PATIENT EDUCATION:  Education details:  Person educated:  International aid/development worker:  Education comprehension:   HOME EXERCISE PROGRAM:   ASSESSMENT:  CLINICAL IMPRESSION: See below.    OBJECTIVE IMPAIRMENTS: Abnormal gait, decreased activity tolerance, decreased strength, increased muscle spasms, postural dysfunction, and pain.   ACTIVITY LIMITATIONS: carrying,  lifting, bending, standing, sleeping, and locomotion level  PARTICIPATION LIMITATIONS: meal prep, cleaning, laundry, shopping, community activity, and yard work  PERSONAL FACTORS: Time since onset of injury/illness/exacerbation and 1-2 comorbidities: prior lumbar surgery, OP are also affecting patient's functional outcome.   REHAB POTENTIAL: Fair plus/good minus.  CLINICAL DECISION MAKING: Evolving/moderate complexity  EVALUATION COMPLEXITY: Moderate   GOALS:  SHORT TERM GOALS: Target date: 12/03/23.  Ind with an initial HEP. Goal status: MET  LONG TERM GOALS: Target date: 02/17/24.  Patient stand 15-20 minutes with left lower extremity pain not > 3/10.  Goal status: NM 2.  Walk a community distance with left lower extremity pain not > 3/10.   Goal status: NM  3.  Perform ADL's with left lower extremity pain not > 3/10.   Goal status: NM  PLAN:  PT FREQUENCY: 2x/week  PT DURATION: 6 weeks  PLANNED INTERVENTIONS: 97110-Therapeutic exercises, 97530- Therapeutic activity, [id number]- Neuromuscular re-education, 97535- Self Care, 02859- Manual therapy, G0283- Electrical stimulation (unattended), 97035-  Ultrasound, Patient/Family education, and Moist heat.  PLAN FOR NEXT SESSION:       Core exercise progression, modalities and STW/M as needed.    PHYSICAL THERAPY DISCHARGE SUMMARY  Visits from Start of Care: 8.  Current functional level related to goals / functional outcomes: See above.     Remaining deficits: Continued pain.     Education / Equipment: HEP.     Patient agrees to discharge. Patient goals were not met. Patient is being discharged due to lack of progress.  Patient plans to try an extended time of Chiropractic sessions.      Amarisa Wilinski, [place], PT 12/12/2023, 9:22 AM

## 2023-12-16 DIAGNOSIS — M51361 Other intervertebral disc degeneration, lumbar region with lower extremity pain only: Secondary | ICD-10-CM | POA: Diagnosis not present

## 2023-12-16 DIAGNOSIS — M9903 Segmental and somatic dysfunction of lumbar region: Secondary | ICD-10-CM | POA: Diagnosis not present

## 2023-12-17 ENCOUNTER — Ambulatory Visit

## 2023-12-17 DIAGNOSIS — M9903 Segmental and somatic dysfunction of lumbar region: Secondary | ICD-10-CM | POA: Diagnosis not present

## 2023-12-17 DIAGNOSIS — M51361 Other intervertebral disc degeneration, lumbar region with lower extremity pain only: Secondary | ICD-10-CM | POA: Diagnosis not present

## 2023-12-19 ENCOUNTER — Ambulatory Visit

## 2023-12-19 DIAGNOSIS — M9903 Segmental and somatic dysfunction of lumbar region: Secondary | ICD-10-CM | POA: Diagnosis not present

## 2023-12-19 DIAGNOSIS — M51361 Other intervertebral disc degeneration, lumbar region with lower extremity pain only: Secondary | ICD-10-CM | POA: Diagnosis not present

## 2023-12-20 ENCOUNTER — Encounter: Payer: Self-pay | Admitting: Family

## 2023-12-20 ENCOUNTER — Ambulatory Visit: Admitting: Family

## 2023-12-20 VITALS — BP 124/85 | HR 104 | Temp 97.8°F | Ht 61.0 in | Wt 133.0 lb

## 2023-12-20 DIAGNOSIS — M5136 Other intervertebral disc degeneration, lumbar region with discogenic back pain only: Secondary | ICD-10-CM | POA: Diagnosis not present

## 2023-12-20 DIAGNOSIS — E559 Vitamin D deficiency, unspecified: Secondary | ICD-10-CM

## 2023-12-20 DIAGNOSIS — L900 Lichen sclerosus et atrophicus: Secondary | ICD-10-CM | POA: Diagnosis not present

## 2023-12-20 DIAGNOSIS — E785 Hyperlipidemia, unspecified: Secondary | ICD-10-CM | POA: Diagnosis not present

## 2023-12-20 DIAGNOSIS — N1831 Chronic kidney disease, stage 3a: Secondary | ICD-10-CM

## 2023-12-20 DIAGNOSIS — N898 Other specified noninflammatory disorders of vagina: Secondary | ICD-10-CM

## 2023-12-20 DIAGNOSIS — K219 Gastro-esophageal reflux disease without esophagitis: Secondary | ICD-10-CM

## 2023-12-20 DIAGNOSIS — E663 Overweight: Secondary | ICD-10-CM | POA: Diagnosis not present

## 2023-12-20 DIAGNOSIS — M810 Age-related osteoporosis without current pathological fracture: Secondary | ICD-10-CM

## 2023-12-20 DIAGNOSIS — M48062 Spinal stenosis, lumbar region with neurogenic claudication: Secondary | ICD-10-CM

## 2023-12-20 DIAGNOSIS — I10 Essential (primary) hypertension: Secondary | ICD-10-CM

## 2023-12-20 LAB — WET PREP FOR TRICH, YEAST, CLUE
Clue Cell Exam: POSITIVE — AB
Trichomonas Exam: NEGATIVE
Yeast Exam: NEGATIVE

## 2023-12-20 LAB — LIPID PANEL

## 2023-12-20 MED ORDER — CLOBETASOL PROPIONATE 0.05 % EX CREA: 1.0000 | TOPICAL_CREAM | Freq: Two times a day (BID) | CUTANEOUS | 0 refills | Status: DC

## 2023-12-20 NOTE — Progress Notes (Signed)
 Subjective:    Patient ID: Cheryl Lee, female    DOB: 1945-10-20, 78 y.o.   MRN: [id number]  Chief Complaint  Patient presents with   Medical Management of Chronic Issues   Pt presents to the office today for chronic follow up.    She has a hx of closed compression fracture and bulging lumbar disc. She has a wedge compression fracture of L4 with sciatica pain.She continues to have intermittent aching pain of 4-5 out 10 when walking for a long distance down left leg, but 0 out 10 when sitting.  She completed PT and at this time does not want surgery. She is followed by Ortho.   Has CKD and avoids NSAID's.   She had a cholecystectomy on 08/23/22. Doing well.    She has osteoporosis and takes Prolia . Last dexa scan 06/24/23. Hypertension This is a chronic problem. The current episode started more than 1 year ago. The problem has been resolved since onset. The problem is controlled. Associated symptoms include peripheral edema (slightly in left leg) and shortness of breath (after walking). Pertinent negatives include no malaise/fatigue. Risk factors for coronary artery disease include dyslipidemia and sedentary lifestyle. The current treatment provides moderate improvement.  Gastroesophageal Reflux She complains of belching and heartburn. She reports no abdominal pain. This is a chronic problem. The current episode started more than 1 year ago. The problem occurs occasionally. The symptoms are aggravated by certain foods. She has tried a PPI for the symptoms. The treatment provided moderate relief.  Hyperlipidemia This is a chronic problem. The current episode started more than 1 year ago. The problem is controlled. Recent lipid tests were reviewed and are normal. She has no history of obesity. Associated symptoms include shortness of breath (after walking). Current antihyperlipidemic treatment includes statins. The current treatment provides moderate improvement of lipids. Risk factors  for coronary artery disease include dyslipidemia, hypertension, a sedentary lifestyle and post-menopausal.  Vaginal Itching The patient's primary symptoms include genital itching and vaginal discharge. The patient's pertinent negatives include no vaginal bleeding. This is a recurrent problem. The current episode started more than 1 month ago. The problem occurs intermittently. The pain is mild. Pertinent negatives include no abdominal pain. She has tried nothing for the symptoms. The treatment provided no relief.      Review of Systems  Constitutional:  Negative for malaise/fatigue.  Respiratory:  Positive for shortness of breath (after walking).   Gastrointestinal:  Positive for heartburn. Negative for abdominal pain.  Genitourinary:  Positive for vaginal discharge.  All other systems reviewed and are negative.      Objective:   Physical Exam Vitals reviewed.  Constitutional:      General: She is not in acute distress.    Appearance: She is well-developed. She is obese.  HENT:     Head: Normocephalic and atraumatic.     Right Ear: Tympanic membrane normal.     Left Ear: Tympanic membrane normal.  Eyes:     Pupils: Pupils are equal, round, and reactive to light.  Neck:     Thyroid : No thyromegaly.  Cardiovascular:     Rate and Rhythm: Normal rate and regular rhythm.     Heart sounds: Normal heart sounds. No murmur heard. Pulmonary:     Effort: Pulmonary effort is normal. No respiratory distress.     Breath sounds: Normal breath sounds. No wheezing.  Abdominal:     General: Bowel sounds are normal. There is no distension.     Palpations:  Abdomen is soft.     Tenderness: There is no abdominal tenderness.  Genitourinary:    Comments: Labia erythemas and white, labia minora fused into labia majora Musculoskeletal:        General: Tenderness present.     Cervical back: Normal range of motion and neck supple.     Comments: Pain in lumbar with standing and flexion, using cane,  pain in right shoulder with abduction   Skin:    General: Skin is warm and dry.  Neurological:     Mental Status: She is alert and oriented to person, place, and time.     Cranial Nerves: No cranial nerve deficit.     Motor: Weakness present.     Gait: Gait abnormal.     Deep Tendon Reflexes: Reflexes are normal and symmetric.  Psychiatric:        Behavior: Behavior normal.        Thought Content: Thought content normal.        Judgment: Judgment normal.      BP 124/85   Pulse (!) 104   Temp 97.8 F (36.6 C)   Ht 5' 1 (1.549 m)   Wt 133 lb (60.3 kg)   LMP 08/05/1991   SpO2 96%   BMI 25.13 kg/m      Assessment & Plan:   Cheryl Lee comes in today with chief complaint of Medical Management of Chronic Issues   Diagnosis and orders addressed: 1. Primary hypertension (Primary) - CMP14+EGFR - CBC with Differential/Platelet - TSH  2. Hyperlipidemia with target LDL less than 100 - CMP14+EGFR - CBC with Differential/Platelet - Lipid panel - TSH  3. Gastroesophageal reflux disease without esophagitis - CMP14+EGFR - CBC with Differential/Platelet  4. Age-related osteoporosis without current pathological fracture - CMP14+EGFR - CBC with Differential/Platelet  5. Stage 3a chronic kidney disease (CKD) (HCC) - CMP14+EGFR - CBC with Differential/Platelet  6. Degeneration of intervertebral disc of lumbar region with discogenic back pain - CMP14+EGFR - CBC with Differential/Platelet  7. Spinal stenosis of lumbar region with neurogenic claudication - CMP14+EGFR - CBC with Differential/Platelet  8. Vitamin D  deficiency - CMP14+EGFR - CBC with Differential/Platelet - VITAMIN D  25 Hydroxy (Vit-D Deficiency, Fractures)  9. Overweight (BMI 25.0-29.9) - CMP14+EGFR - CBC with Differential/Platelet  10. Vagina itching - WET PREP FOR TRICH, YEAST, CLUE - CMP14+EGFR - CBC with Differential/Platelet  11. Lichen sclerosus Start clobetasol  daily for next 4-5  days then three times a week or for flare  up - clobetasol  cream (TEMOVATE ) 0.05 %; Apply 1 Application topically 2 (two) times daily.  Dispense: 60 g; Refill: 0    Labs pending Continue current medications  Keep follow up with specialists  Health Maintenance reviewed Diet and exercise encouraged  Follow up plan: 4 months   Bari Learn, FNP

## 2023-12-20 NOTE — Patient Instructions (Addendum)
 Chronic Back Pain Chronic back pain is back pain that lasts longer than 3 months. The cause of your back pain may not be known. Some common causes include: Wear and tear (degenerative disease) of the bones, disks, or tissues that connect bones to each other (ligaments) in your back. Inflammation and stiffness in your back (arthritis). If you have chronic back pain, you may have times when the pain is more intense (flare-ups). You can also learn to manage the pain with home care. Follow these instructions at home: Watch for any changes in your symptoms. Take these actions to help with your pain: Managing pain and stiffness     If told, put ice on the painful area. You may be told to apply ice for the first 24-48 hours after a flare-up starts. Put ice in a plastic bag. Place a towel between your skin and the bag. Leave the ice on for 20 minutes, 2-3 times per day. If told, apply heat to the affected area as often as told by your health care provider. Use the heat source that your provider recommends, such as a moist heat pack or a heating pad. Place a towel between your skin and the heat source. Leave the heat on for 20-30 minutes. If your skin turns bright red, remove the ice or heat right away to prevent skin damage. The risk of damage is higher if you cannot feel pain, heat, or cold. Try soaking in a warm tub. Activity        Avoid bending and other activities that make the pain worse. Have good posture when you stand or sit. When you stand, keep your upper back and neck straight, with your shoulders pulled back. Avoid slouching. When you sit, keep your back straight. Relax your shoulders. Do not round your shoulders or pull them backward. Do not sit or stand in one place for too long. Take brief periods of rest during the day. This will reduce your pain. Resting in a lying or standing position is often better than sitting to rest. When you rest for longer periods, mix in some mild  activity or stretching between periods of rest. This will help to prevent stiffness and pain. Get regular exercise. Ask your provider what activities are safe for you. You may have to avoid lifting. Ask your provider how much you can safely lift. If you do lift, always use the right technique. This means you should: Bend your knees. Keep the load close to your body. Avoid twisting. Medicines Take over-the-counter and prescription medicines only as told by your provider. You may need to take medicines for pain and inflammation. These may be taken by mouth or put on the skin. You may also be given muscle relaxants. Ask your provider if the medicine prescribed to you: Requires you to avoid driving or using machinery. Can cause constipation. You may need to take these actions to prevent or treat constipation: Drink enough fluid to keep your pee (urine) pale yellow. Take over-the-counter or prescription medicines. Eat foods that are high in fiber, such as beans, whole grains, and fresh fruits and vegetables. Limit foods that are high in fat and processed sugars, such as fried or sweet foods. General instructions  Sleep on a firm mattress in a comfortable position. Try lying on your side with your knees slightly bent. If you lie on your back, put a pillow under your knees. Do not use any products that contain nicotine or tobacco. These products include cigarettes, chewing tobacco,  and vaping devices, such as e-cigarettes. If you need help quitting, ask your provider. Contact a health care provider if: You have pain that does not get better with rest or medicine. You have new pain. You have a fever. You lose weight quickly. You have trouble doing your normal activities. You feel weak or numb in one or both of your legs or feet. Get help right away if: You are not able to control when you pee or poop. You have severe back pain and: Nausea or vomiting. Pain in your chest or abdomen. Shortness  of breath. You faint. These symptoms may be an emergency. Get help right away. Call 911. Do not wait to see if the symptoms will go away. Do not drive yourself to the hospital. This information is not intended to replace advice given to you by your health care provider. Make sure you discuss any questions you have with your health care provider.   Itchy, Painful, White Skin Patches (Lichen Sclerosus): What to Know Lichen sclerosus is a skin problem. It can happen on any part of your body, but often affects the areas around your genitals or the opening of your butt (anus). Treatment can help to control symptoms. It can also help prevent scarring that may lead to other problems. Lichen sclerosus isn't an infection or a fungus. It can't be passed from person to person. What are the causes? The cause of this condition isn't known. It may be related to: The body's defense system, also called the immune system, reacting too strongly. A lack of certain hormones. What increases the risk? You're more likely to get this condition if: You're a female who has stopped having periods. This is called menopause. You're a female who wasn't circumcised. You're a child who hasn't reached puberty yet. What are the signs or symptoms?  Plaques. These are areas on the skin that are: White. Thin. Wrinkled. Thickened. Red and swollen patches on the skin. Tears or cracks in the skin. Bruising. Blood blisters. Very bad itching. Pain, itching, or burning when peeing. Trouble pooping (constipation), especially in children. How is this diagnosed? This condition may be diagnosed with a physical exam. Sometimes, a tissue sample is taken and checked under a microscope. This is called a biopsy. How is this treated? This condition may be treated with: Topical steroids. These are creams and ointments that are put on the skin. Medicines taken by mouth. Topical immunotherapy. These are creams and ointments that are  put on the skin to stimulate the body's defense system. Surgery. This may need to be done if there are problems like scarring. Follow these instructions at home: Medicines Take or apply your medicines only as told. Use creams or ointments as told by your health care provider. Skin care Do not scratch the affected areas. Keep the affected areas clean and dry. Clean the affected area gently with water  only. Pat your skin dry. Avoid using rough towels or toilet paper. Avoid skin products that irritate the skin. These include scented soaps, lotions, and bubble bath. Use thick cream or ointments to lessen itching as told. General instructions Your condition may cause trouble pooping. To help prevent or treat this, you may need to: Take medicines to help you poop. Eat foods high in fiber, like beans, whole grains, and fresh fruits and vegetables. Drink more fluids as told. Keep all follow-up visits to make sure the treatment plan is working. Contact a health care provider if: You have more redness, swelling, or pain  in the affected area. You have fluid, blood, or pus coming from the affected area. You have new patches on your skin. You have a fever. You have pain during sex. This information is not intended to replace advice given to you by your health care provider. Make sure you discuss any questions you have with your health care provider. Document Revised: 12/04/2022 Document Reviewed: 12/04/2022 Elsevier Patient Education  2024 Elsevier Inc. Document Revised: 12/18/2021 Document Reviewed: 12/18/2021 Elsevier Patient Education  2024 [employer].

## 2023-12-21 LAB — CMP14+EGFR
ALT: 13 IU/L (ref 0–32)
AST: 18 IU/L (ref 0–40)
Albumin: 4.2 g/dL (ref 3.8–4.8)
Alkaline Phosphatase: 62 IU/L (ref 44–121)
BUN/Creatinine Ratio: 15 (ref 12–28)
BUN: 17 mg/dL (ref 8–27)
Bilirubin Total: 0.3 mg/dL (ref 0.0–1.2)
CO2: 20 mmol/L (ref 20–29)
Calcium: 10.3 mg/dL (ref 8.7–10.3)
Chloride: 101 mmol/L (ref 96–106)
Creatinine, Ser: 1.13 mg/dL — ABNORMAL HIGH (ref 0.57–1.00)
Globulin, Total: 2.1 g/dL (ref 1.5–4.5)
Glucose: 98 mg/dL (ref 70–99)
Potassium: 5.1 mmol/L (ref 3.5–5.2)
Sodium: 140 mmol/L (ref 134–144)
Total Protein: 6.3 g/dL (ref 6.0–8.5)
eGFR: 50 mL/min/1.73 — ABNORMAL LOW (ref >59)

## 2023-12-21 LAB — CBC WITH DIFFERENTIAL/PLATELET
Basophils Absolute: 0 x10E3/uL (ref 0.0–0.2)
Basos: 1 %
EOS (ABSOLUTE): 0.3 x10E3/uL (ref 0.0–0.4)
Eos: 3 %
Hematocrit: 40.6 % (ref 34.0–46.6)
Hemoglobin: 13.1 g/dL (ref 11.1–15.9)
Immature Grans (Abs): 0 x10E3/uL (ref 0.0–0.1)
Immature Granulocytes: 0 %
Lymphocytes Absolute: 2.1 x10E3/uL (ref 0.7–3.1)
Lymphs: 25 %
MCH: 33 pg (ref 26.6–33.0)
MCHC: 32.3 g/dL (ref 31.5–35.7)
MCV: 102 fL — ABNORMAL HIGH (ref 79–97)
Monocytes Absolute: 0.5 x10E3/uL (ref 0.1–0.9)
Monocytes: 6 %
Neutrophils Absolute: 5.4 x10E3/uL (ref 1.4–7.0)
Neutrophils: 65 %
Platelets: 346 x10E3/uL (ref 150–450)
RBC: 3.97 x10E6/uL (ref 3.77–5.28)
RDW: 11.5 % — ABNORMAL LOW (ref 11.7–15.4)
WBC: 8.4 x10E3/uL (ref 3.4–10.8)

## 2023-12-21 LAB — LIPID PANEL
Chol/HDL Ratio: 2.5 ratio (ref 0.0–4.4)
Cholesterol, Total: 155 mg/dL (ref 100–199)
HDL: 62 mg/dL (ref >39)
LDL Chol Calc (NIH): 66 mg/dL (ref 0–99)
Triglycerides: 162 mg/dL — ABNORMAL HIGH (ref 0–149)
VLDL Cholesterol Cal: 27 mg/dL (ref 5–40)

## 2023-12-21 LAB — TSH: TSH: 2.8 u[IU]/mL (ref 0.450–4.500)

## 2023-12-21 LAB — VITAMIN D 25 HYDROXY (VIT D DEFICIENCY, FRACTURES): Vit D, 25-Hydroxy: 55.3 ng/mL (ref 30.0–100.0)

## 2023-12-23 ENCOUNTER — Ambulatory Visit: Payer: Self-pay | Admitting: Family

## 2023-12-23 ENCOUNTER — Other Ambulatory Visit: Payer: Self-pay | Admitting: Family

## 2023-12-23 DIAGNOSIS — M9903 Segmental and somatic dysfunction of lumbar region: Secondary | ICD-10-CM | POA: Diagnosis not present

## 2023-12-23 DIAGNOSIS — M51361 Other intervertebral disc degeneration, lumbar region with lower extremity pain only: Secondary | ICD-10-CM | POA: Diagnosis not present

## 2023-12-23 MED ORDER — METRONIDAZOLE 500 MG PO TABS: 500.0000 mg | ORAL_TABLET | Freq: Two times a day (BID) | ORAL | 0 refills | Status: AC

## 2023-12-25 DIAGNOSIS — M51361 Other intervertebral disc degeneration, lumbar region with lower extremity pain only: Secondary | ICD-10-CM | POA: Diagnosis not present

## 2023-12-25 DIAGNOSIS — M9903 Segmental and somatic dysfunction of lumbar region: Secondary | ICD-10-CM | POA: Diagnosis not present

## 2023-12-26 ENCOUNTER — Telehealth: Payer: Self-pay

## 2023-12-26 DIAGNOSIS — M51361 Other intervertebral disc degeneration, lumbar region with lower extremity pain only: Secondary | ICD-10-CM | POA: Diagnosis not present

## 2023-12-26 DIAGNOSIS — M9903 Segmental and somatic dysfunction of lumbar region: Secondary | ICD-10-CM | POA: Diagnosis not present

## 2023-12-26 DIAGNOSIS — M810 Age-related osteoporosis without current pathological fracture: Secondary | ICD-10-CM

## 2023-12-26 MED ORDER — DENOSUMAB 60 MG/ML ~~LOC~~ SOSY
60.0000 mg | PREFILLED_SYRINGE | Freq: Once | SUBCUTANEOUS | Status: AC
Start: 2024-01-26 — End: 2024-01-30
  Administered 2024-01-30: 60 mg via SUBCUTANEOUS

## 2023-12-26 NOTE — Telephone Encounter (Signed)
 Prolia  VOB initiated via [email or web address]  Next Prolia  inj DUE: 01/30/24

## 2023-12-26 NOTE — Telephone Encounter (Signed)
 Prolia  sent for benefit verification

## 2023-12-30 DIAGNOSIS — M9903 Segmental and somatic dysfunction of lumbar region: Secondary | ICD-10-CM | POA: Diagnosis not present

## 2023-12-30 DIAGNOSIS — M51361 Other intervertebral disc degeneration, lumbar region with lower extremity pain only: Secondary | ICD-10-CM | POA: Diagnosis not present

## 2023-12-30 NOTE — Telephone Encounter (Signed)
 Cheryl Lee

## 2023-12-30 NOTE — Telephone Encounter (Signed)
 Pt ready for scheduling for PROLIA  on or after : 01/30/24  Option# 1: Buy/Bill (Office supplied medication)  Out-of-pocket cost due at time of clinic visit: $0  Number of injection/visits approved: ---  Primary: MEDICARE Prolia  co-insurance: 0% Admin fee co-insurance: 0%  Secondary: [employer]MED[place] Prolia  co-insurance:  Admin fee co-insurance:   Medical Benefit Details: Date Benefits were checked: 12/26/23 Deductible: $257 Met of $257 Required/ Coinsurance: 0%/ Admin Fee: 0%  Prior Auth: N/A PA# Expiration Date:   # of doses approved: ----------------------------------------------------------------------- Option# 2- Med Obtained from pharmacy:  Pharmacy benefit: Copay $--- (Paid to pharmacy) Admin Fee: --- (Pay at clinic)  Prior Auth: --- PA# Expiration Date:   # of doses approved:   If patient wants fill through the pharmacy benefit please send prescription to: ---, and include estimated need by date in rx notes. Pharmacy will ship medication directly to the office.  Patient NOT eligible for Prolia  Copay Card. Copay Card can make patient's cost as little as $25. Link to apply: [email or web address]://[email or web address]  ** This summary of benefits is an estimation of the patient's out-of-pocket cost. Exact cost may very based on individual plan coverage.

## 2023-12-31 DIAGNOSIS — M51361 Other intervertebral disc degeneration, lumbar region with lower extremity pain only: Secondary | ICD-10-CM | POA: Diagnosis not present

## 2023-12-31 DIAGNOSIS — M9903 Segmental and somatic dysfunction of lumbar region: Secondary | ICD-10-CM | POA: Diagnosis not present

## 2023-12-31 NOTE — Telephone Encounter (Signed)
 Called patient - appt made for 01/30/2024

## 2024-01-01 DIAGNOSIS — M9903 Segmental and somatic dysfunction of lumbar region: Secondary | ICD-10-CM | POA: Diagnosis not present

## 2024-01-01 DIAGNOSIS — M51361 Other intervertebral disc degeneration, lumbar region with lower extremity pain only: Secondary | ICD-10-CM | POA: Diagnosis not present

## 2024-01-06 DIAGNOSIS — M51361 Other intervertebral disc degeneration, lumbar region with lower extremity pain only: Secondary | ICD-10-CM | POA: Diagnosis not present

## 2024-01-06 DIAGNOSIS — M9903 Segmental and somatic dysfunction of lumbar region: Secondary | ICD-10-CM | POA: Diagnosis not present

## 2024-01-07 DIAGNOSIS — M51361 Other intervertebral disc degeneration, lumbar region with lower extremity pain only: Secondary | ICD-10-CM | POA: Diagnosis not present

## 2024-01-07 DIAGNOSIS — M9903 Segmental and somatic dysfunction of lumbar region: Secondary | ICD-10-CM | POA: Diagnosis not present

## 2024-01-30 ENCOUNTER — Ambulatory Visit (INDEPENDENT_AMBULATORY_CARE_PROVIDER_SITE_OTHER)

## 2024-01-30 DIAGNOSIS — M810 Age-related osteoporosis without current pathological fracture: Secondary | ICD-10-CM

## 2024-01-30 NOTE — Progress Notes (Signed)
 Patient presented to clinic today for Prolia  injection. Given in right arm. Patient tolerated well.

## 2024-03-24 ENCOUNTER — Encounter: Payer: Self-pay | Admitting: Family

## 2024-03-24 ENCOUNTER — Ambulatory Visit (INDEPENDENT_AMBULATORY_CARE_PROVIDER_SITE_OTHER): Payer: Self-pay | Admitting: Family

## 2024-03-24 VITALS — BP 126/75 | HR 101 | Temp 97.8°F | Ht 61.0 in | Wt 136.0 lb

## 2024-03-24 DIAGNOSIS — I10 Essential (primary) hypertension: Secondary | ICD-10-CM

## 2024-03-24 DIAGNOSIS — E663 Overweight: Secondary | ICD-10-CM

## 2024-03-24 DIAGNOSIS — K219 Gastro-esophageal reflux disease without esophagitis: Secondary | ICD-10-CM | POA: Diagnosis not present

## 2024-03-24 DIAGNOSIS — M48062 Spinal stenosis, lumbar region with neurogenic claudication: Secondary | ICD-10-CM | POA: Diagnosis not present

## 2024-03-24 DIAGNOSIS — M5416 Radiculopathy, lumbar region: Secondary | ICD-10-CM | POA: Diagnosis not present

## 2024-03-24 DIAGNOSIS — M810 Age-related osteoporosis without current pathological fracture: Secondary | ICD-10-CM

## 2024-03-24 DIAGNOSIS — E785 Hyperlipidemia, unspecified: Secondary | ICD-10-CM | POA: Diagnosis not present

## 2024-03-24 DIAGNOSIS — L900 Lichen sclerosus et atrophicus: Secondary | ICD-10-CM

## 2024-03-24 DIAGNOSIS — E559 Vitamin D deficiency, unspecified: Secondary | ICD-10-CM | POA: Diagnosis not present

## 2024-03-24 DIAGNOSIS — M5136 Other intervertebral disc degeneration, lumbar region with discogenic back pain only: Secondary | ICD-10-CM

## 2024-03-24 DIAGNOSIS — N1831 Chronic kidney disease, stage 3a: Secondary | ICD-10-CM

## 2024-03-24 MED ORDER — OMEPRAZOLE 20 MG PO CPDR: 20.0000 mg | DELAYED_RELEASE_CAPSULE | Freq: Every day | ORAL | 3 refills | Status: DC

## 2024-03-24 MED ORDER — CLOBETASOL PROPIONATE 0.05 % EX CREA: 1.0000 | TOPICAL_CREAM | Freq: Two times a day (BID) | CUTANEOUS | 5 refills | Status: AC

## 2024-03-24 MED ORDER — ATORVASTATIN CALCIUM 10 MG PO TABS: 10.0000 mg | ORAL_TABLET | Freq: Every day | ORAL | 3 refills | Status: AC

## 2024-03-24 NOTE — Patient Instructions (Signed)
 Itchy, Painful, White Skin Patches (Lichen Sclerosus): What to Know Lichen sclerosus is a skin problem. It can happen on any part of your body, but often affects the areas around your genitals or the opening of your butt (anus). Treatment can help to control symptoms. It can also help prevent scarring that may lead to other problems. Lichen sclerosus isn't an infection or a fungus. It can't be passed from person to person. What are the causes? The cause of this condition isn't known. It may be related to: The body's defense system, also called the immune system, reacting too strongly. A lack of certain hormones. What increases the risk? You're more likely to get this condition if: You're a female who has stopped having periods. This is called menopause. You're a female who wasn't circumcised. You're a child who hasn't reached puberty yet. What are the signs or symptoms?  Plaques. These are areas on the skin that are: White. Thin. Wrinkled. Thickened. Red and swollen patches on the skin. Tears or cracks in the skin. Bruising. Blood blisters. Very bad itching. Pain, itching, or burning when peeing. Trouble pooping (constipation), especially in children. How is this diagnosed? This condition may be diagnosed with a physical exam. Sometimes, a tissue sample is taken and checked under a microscope. This is called a biopsy. How is this treated? This condition may be treated with: Topical steroids. These are creams and ointments that are put on the skin. Medicines taken by mouth. Topical immunotherapy. These are creams and ointments that are put on the skin to stimulate the body's defense system. Surgery. This may need to be done if there are problems like scarring. Follow these instructions at home: Medicines Take or apply your medicines only as told. Use creams or ointments as told by your health care provider. Skin care Do not scratch the affected areas. Keep the affected areas  clean and dry. Clean the affected area gently with water  only. Pat your skin dry. Avoid using rough towels or toilet paper. Avoid skin products that irritate the skin. These include scented soaps, lotions, and bubble bath. Use thick cream or ointments to lessen itching as told. General instructions Your condition may cause trouble pooping. To help prevent or treat this, you may need to: Take medicines to help you poop. Eat foods high in fiber, like beans, whole grains, and fresh fruits and vegetables. Drink more fluids as told. Keep all follow-up visits to make sure the treatment plan is working. Contact a health care provider if: You have more redness, swelling, or pain in the affected area. You have fluid, blood, or pus coming from the affected area. You have new patches on your skin. You have a fever. You have pain during sex. This information is not intended to replace advice given to you by your health care provider. Make sure you discuss any questions you have with your health care provider. Document Revised: 12/04/2022 Document Reviewed: 12/04/2022 Elsevier Patient Education  2024 [employer].

## 2024-03-24 NOTE — Progress Notes (Signed)
 Subjective:    Patient ID: Erminio DELENA Misty, female    DOB: 10-Dec-1945, 78 y.o.   MRN: [id number]  Chief Complaint  Patient presents with   Medical Management of Chronic Issues   Pt presents to the office today for chronic follow up.    She has a hx of closed compression fracture and bulging lumbar disc. She has a wedge compression fracture of L4 with sciatica pain.She continues to have intermittent aching pain of 1-2 out 10 when walking for a long distance down left leg, but 0 out 10 when sitting.  She completed PT and at this time does not want surgery. She is followed by Ortho and a chiropractor.   Has CKD and avoids NSAID's.   She had a cholecystectomy on 08/23/22. Doing well.    She has osteoporosis and takes Prolia . Last dexa scan 06/24/23. Hypertension This is a chronic problem. The current episode started more than 1 year ago. The problem has been resolved since onset. The problem is controlled. Associated symptoms include shortness of breath (after walking). Pertinent negatives include no malaise/fatigue or peripheral edema. Risk factors for coronary artery disease include dyslipidemia, sedentary lifestyle and post-menopausal state. The current treatment provides moderate improvement.  Gastroesophageal Reflux She complains of belching and heartburn. She reports no abdominal pain. This is a chronic problem. The current episode started more than 1 year ago. The problem occurs occasionally. The symptoms are aggravated by certain foods. She has tried a PPI for the symptoms. The treatment provided moderate relief.  Hyperlipidemia This is a chronic problem. The current episode started more than 1 year ago. The problem is controlled. Recent lipid tests were reviewed and are normal. She has no history of obesity. Associated symptoms include shortness of breath (after walking). Current antihyperlipidemic treatment includes statins. The current treatment provides moderate improvement of  lipids. Risk factors for coronary artery disease include dyslipidemia, hypertension, a sedentary lifestyle and post-menopausal.  Vaginal Itching The patient's primary symptoms include genital itching. The patient's pertinent negatives include no vaginal bleeding or vaginal discharge. This is a chronic problem. The current episode started more than 1 month ago. The problem occurs intermittently. The pain is mild. Pertinent negatives include no abdominal pain. Treatments tried: clobetasol  cream. The treatment provided moderate relief.      Review of Systems  Constitutional:  Negative for malaise/fatigue.  Respiratory:  Positive for shortness of breath (after walking).   Gastrointestinal:  Positive for heartburn. Negative for abdominal pain.  Genitourinary:  Negative for vaginal discharge.  All other systems reviewed and are negative.      Objective:   Physical Exam Vitals reviewed.  Constitutional:      General: She is not in acute distress.    Appearance: She is well-developed. She is obese.  HENT:     Head: Normocephalic and atraumatic.     Right Ear: Tympanic membrane normal.     Left Ear: Tympanic membrane normal.  Eyes:     Pupils: Pupils are equal, round, and reactive to light.  Neck:     Thyroid : No thyromegaly.  Cardiovascular:     Rate and Rhythm: Normal rate and regular rhythm.     Heart sounds: Normal heart sounds. No murmur heard. Pulmonary:     Effort: Pulmonary effort is normal. No respiratory distress.     Breath sounds: Normal breath sounds. No wheezing.  Abdominal:     General: Bowel sounds are normal. There is no distension.     Palpations: Abdomen is  soft.     Tenderness: There is no abdominal tenderness.  Genitourinary:    Comments: Labia erythemas and white, labia minora fused into labia majora Musculoskeletal:        General: Tenderness present.     Cervical back: Normal range of motion and neck supple.     Comments: Pain in lumbar with standing and  flexion, using cane, pain in right shoulder with abduction   Skin:    General: Skin is warm and dry.  Neurological:     Mental Status: She is alert and oriented to person, place, and time.     Cranial Nerves: No cranial nerve deficit.     Motor: Weakness present.     Gait: Gait abnormal.     Deep Tendon Reflexes: Reflexes are normal and symmetric.  Psychiatric:        Behavior: Behavior normal.        Thought Content: Thought content normal.        Judgment: Judgment normal.      BP 126/75   Pulse (!) 101   Temp 97.8 F (36.6 C) (Temporal)   Ht 5' 1 (1.549 m)   Wt 136 lb (61.7 kg)   LMP 08/05/1991   SpO2 98%   BMI 25.70 kg/m      Assessment & Plan:   JERICHO CIESLIK comes in today with chief complaint of Medical Management of Chronic Issues   Diagnosis and orders addressed: 1. Hyperlipidemia with target LDL less than 100 - atorvastatin  (LIPITOR) 10 MG tablet; Take 1 tablet (10 mg total) by mouth daily.  Dispense: 90 tablet; Refill: 3  2. Gastroesophageal reflux disease without esophagitis - omeprazole  (PRILOSEC) 20 MG capsule; Take 1 capsule (20 mg total) by mouth daily.  Dispense: 90 capsule; Refill: 3  3. Vitamin D  deficiency  4. Stage 3a chronic kidney disease (CKD) (HCC) -Avoid NSAID's  5. Spinal stenosis of lumbar region with neurogenic claudication Keep Ortho and chiropractor follow up  6. Overweight (BMI 25.0-29.9)  7. Age-related osteoporosis without current pathological fracture Continue Prolia    8. Lichen sclerosus - clobetasol  cream (TEMOVATE ) 0.05 %; Apply 1 Application topically 2 (two) times daily.  Dispense: 60 g; Refill: 5  9. Primary hypertension (Primary) At goal   10. Lumbar nerve root impingement Keep Ortho and chiropractor follow up  11. Degeneration of intervertebral disc of lumbar region with discogenic back pain Keep Ortho and chiropractor follow up   Labs reviewed Continue current medications  Keep follow up with  specialists  Health Maintenance reviewed Diet and exercise encouraged  Follow up plan: 4 months   Bari Learn, FNP

## 2024-04-02 ENCOUNTER — Other Ambulatory Visit: Payer: Self-pay | Admitting: *Deleted

## 2024-04-02 DIAGNOSIS — I10 Essential (primary) hypertension: Secondary | ICD-10-CM

## 2024-04-02 MED ORDER — LISINOPRIL-HYDROCHLOROTHIAZIDE 20-12.5 MG PO TABS: 0.5000 | ORAL_TABLET | Freq: Every day | ORAL | 1 refills | Status: AC

## 2024-04-06 ENCOUNTER — Other Ambulatory Visit: Payer: Self-pay | Admitting: Family

## 2024-04-06 DIAGNOSIS — E785 Hyperlipidemia, unspecified: Secondary | ICD-10-CM

## 2024-04-13 ENCOUNTER — Ambulatory Visit: Payer: Self-pay

## 2024-04-21 ENCOUNTER — Other Ambulatory Visit: Payer: Self-pay | Admitting: Family

## 2024-04-21 DIAGNOSIS — K219 Gastro-esophageal reflux disease without esophagitis: Secondary | ICD-10-CM

## 2024-04-24 ENCOUNTER — Ambulatory Visit

## 2024-04-24 VITALS — BP 126/75 | HR 101 | Ht 61.0 in | Wt 136.0 lb

## 2024-04-24 DIAGNOSIS — Z0000 Encounter for general adult medical examination without abnormal findings: Secondary | ICD-10-CM

## 2024-04-24 NOTE — Progress Notes (Signed)
 Chief Complaint  Patient presents with   Medicare Wellness     Subjective:   Cheryl Lee is a 78 y.o. female who presents for a Medicare Annual Wellness Visit.  Visit info / Clinical Intake: Medicare Wellness Visit Type:: Subsequent Annual Wellness Visit Persons participating in visit and providing information:: patient Medicare Wellness Visit Mode:: Telephone If telephone:: video declined Since this visit was completed virtually, some vitals may be partially provided or unavailable. Missing vitals are due to the limitations of the virtual format.: Documented vitals are patient reported If Telephone or Video please confirm:: I connected with patient using audio/video enable telemedicine. I verified patient identity with two identifiers, discussed telehealth limitations, and patient agreed to proceed. Patient Location:: home Provider Location:: home office Interpreter Needed?: No Pre-visit prep was completed: yes AWV questionnaire completed by patient prior to visit?: no Living arrangements:: (!) lives alone Patient's Overall Health Status Rating: very good Typical amount of pain: some (pt have siatic hip pain down the leg at L-side) Does pain affect daily life?: no Are you currently prescribed opioids?: no  Dietary Habits and Nutritional Risks How many meals a day?: 3 Eats fruit and vegetables daily?: yes Most meals are obtained by: preparing own meals In the last 2 weeks, have you had any of the following?: (!) nausea, vomiting, diarrhea Diabetic:: no  Functional Status Activities of Daily Living (to include ambulation/medication): Independent Ambulation: Independent with device- listed below Home Assistive Devices/Equipment: Cane Medication Administration: Independent Home Management (perform basic housework or laundry): Independent Manage your own finances?: yes Primary transportation is: driving Concerns about vision?: no *vision screening is required for WTM*  (last ov 1/25/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 Jama at [facility] in [facility]) Concerns about hearing?: no  Fall Screening Falls in the past year?: 0 Number of falls in past year: 0 Was there an injury with Fall?: 0 Fall Risk Category Calculator: 0 Patient Fall Risk Level: Low Fall Risk  Fall Risk Patient at Risk for Falls Due to: No Fall Risks Fall risk Follow up: Falls evaluation completed; Education provided  Home and Transportation Safety: All rugs have non-skid backing?: yes All stairs or steps have railings?: yes Grab bars in the bathtub or shower?: (!) no Have non-skid surface in bathtub or shower?: yes Good home lighting?: yes Regular seat belt use?: yes Hospital stays in the last year:: no  Cognitive Assessment Difficulty concentrating, remembering, or making decisions? : no Will 6CIT or Mini Cog be Completed: yes What year is it?: 0 points What month is it?: 0 points Give patient an address phrase to remember (5 components): [address], [place], [place] About what time is it?: 0 points Count backwards from 20 to 1: 0 points Say the months of the year in reverse: 0 points Repeat the address phrase from earlier: 0 points 6 CIT Score: 0 points  Advance Directives (For Healthcare) Does Patient Have a Medical Advance Directive?: No Would patient like information on creating a medical advance directive?: No - Patient declined  Reviewed/Updated  Reviewed/Updated: Reviewed All (Medical, Surgical, Family, Medications, Allergies, Care Teams, Patient Goals); Medical History; Surgical History; Family History; Medications; Allergies; Care Teams; Patient Goals    Allergies (verified) Lycopene   Current Medications (verified) Outpatient Encounter Medications as of 04/24/2024  Medication Sig   acetaminophen  (TYLENOL ) 650 MG CR tablet Take 1,300 mg by mouth every 8 (eight) hours as needed for pain.   Ascorbic Acid  (VITAMIN C ) 500 MG CAPS Take 500 mg by mouth daily.  aspirin  81  MG tablet Take 1 tablet (81 mg total) by mouth daily.   atorvastatin  (LIPITOR) 10 MG tablet Take 1 tablet (10 mg total) by mouth daily.   clobetasol  cream (TEMOVATE ) 0.05 % Apply 1 Application topically 2 (two) times daily.   fluticasone  (FLONASE ) 50 MCG/ACT nasal spray Place 2 sprays into both nostrils daily.   lisinopril -hydrochlorothiazide  (ZESTORETIC ) 20-12.5 MG tablet Take 0.5 tablets by mouth daily.   Multiple Vitamins-Minerals (MULTIVITAMINS THER. W/MINERALS) TABS Take 1 tablet by mouth daily.   omeprazole  (PRILOSEC) 20 MG capsule TAKE 1 CAPSULE BY MOUTH EVERY DAY   Polyethyl Glycol-Propyl Glycol (SYSTANE) 0.4-0.3 % SOLN Place 1 drop into both eyes daily as needed (Dry eye).   trolamine salicylate (ASPERCREME) 10 % cream Apply 1 Application topically daily as needed for muscle pain (cramps).   No facility-administered encounter medications on file as of 04/24/2024.    History: Past Medical History:  Diagnosis Date   Acid reflux    Arthritis    Dyspnea    HNP (herniated nucleus pulposus), lumbar    Hypercholesteremia    Hypertension    Osteoporosis    Sinus congestion    Past Surgical History:  Procedure Laterality Date   BIOPSY  08/23/2022   Procedure: BIOPSY;  Surgeon: Wilhelmenia Aloha Raddle., MD;  Location: [facility];  Service: Gastroenterology;;   CATARACT EXTRACTION     CHOLECYSTECTOMY N/A 08/23/2022   Procedure: LAPAROSCOPIC CHOLECYSTECTOMY;  Surgeon: Dasie Leonor CROME, MD;  Location: [facility];  Service: General;  Laterality: N/A;   COLONOSCOPY     COLONOSCOPY N/A 01/21/2020   Procedure: COLONOSCOPY;  Surgeon: Golda Claudis PENNER, MD;  Location: [facility];  Service: Endoscopy;  Laterality: N/A;  730   ERCP N/A 08/23/2022   Procedure: ENDOSCOPIC RETROGRADE CHOLANGIOPANCREATOGRAPHY (ERCP);  Surgeon: Wilhelmenia Aloha Raddle., MD;  Location: [facility];  Service: Gastroenterology;  Laterality: N/A;   LUMBAR LAMINECTOMY/DECOMPRESSION MICRODISCECTOMY Left 05/01/2017   Procedure:  Microdiscectomy - Lumbar four-five  - left;  Surgeon: Onetha Kuba, MD;  Location: [facility] OR;  Service: Neurosurgery;  Laterality: Left;   POLYPECTOMY  01/21/2020   Procedure: POLYPECTOMY;  Surgeon: Golda Claudis PENNER, MD;  Location: [facility];  Service: Endoscopy;;   REMOVAL OF STONES  08/23/2022   Procedure: REMOVAL OF STONES;  Surgeon: Wilhelmenia Aloha Raddle., MD;  Location: [facility];  Service: Gastroenterology;;   Right snkle repair     SPHINCTEROTOMY  08/23/2022   Procedure: ANNETT;  Surgeon: Mansouraty, Aloha Raddle., MD;  Location: [facility];  Service: Gastroenterology;;   Family History  Problem Relation Age of Onset   Cancer Mother    Alzheimer's disease Mother    Heart disease Mother    Early death Father    Diabetes Sister    Diabetes Sister    Cancer Sister 57       uterine   Hypertension Sister    Healthy Daughter    Stroke Brother    Breast cancer Neg Hx    Social History   Occupational History   Occupation: retired  Tobacco Use   Smoking status: Never   Smokeless tobacco: Never  Vaping Use   Vaping status: Never Used  Substance and Sexual Activity   Alcohol  use: No   Drug use: No   Sexual activity: Not Currently   Tobacco Counseling Counseling given: Yes  SDOH Screenings   Food Insecurity: No Food Insecurity (04/24/2024)  Housing: Low Risk (04/24/2024)  Transportation Needs: No Transportation Needs (04/24/2024)  Utilities: Not At  Risk (04/24/2024)  Alcohol  Screen: Low Risk (04/03/2023)  Depression (PHQ2-9): Low Risk (04/24/2024)  Financial Resource Strain: Low Risk (03/22/2024)  Physical Activity: Insufficiently Active (04/24/2024)  Social Connections: Moderately Isolated (04/24/2024)  Stress: No Stress Concern Present (04/24/2024)  Tobacco Use: Low Risk (04/24/2024)  Health Literacy: Adequate Health Literacy (04/24/2024)   See flowsheets for full screening details  Depression Screen PHQ 2 & 9 Depression Scale- Over the past 2 weeks, how often have  you been bothered by any of the following problems? Little interest or pleasure in doing things: 0 Feeling down, depressed, or hopeless (PHQ Adolescent also includes...irritable): 0 PHQ-2 Total Score: 0 Trouble falling or staying asleep, or sleeping too much: 0 Feeling tired or having little energy: 0 Poor appetite or overeating (PHQ Adolescent also includes...weight loss): 0 Feeling bad about yourself - or that you are a failure or have let yourself or your family down: 0 Trouble concentrating on things, such as reading the newspaper or watching television (PHQ Adolescent also includes...like school work): 0 Moving or speaking so slowly that other people could have noticed. Or the opposite - being so fidgety or restless that you have been moving around a lot more than usual: 0 Thoughts that you would be better off dead, or of hurting yourself in some way: 0 PHQ-9 Total Score: 0 If you checked off any problems, how difficult have these problems made it for you to do your work, take care of things at home, or get along with other people?: Not difficult at all  Depression Treatment Depression Interventions/Treatment : EYV7-0 Score <4 Follow-up Not Indicated     Goals Addressed             This Visit's Progress    Patient Stated   On track    Remain active, independent, and healthy             Objective:    Today's Vitals   04/24/24 0914  BP: 126/75  Pulse: (!) 101  Weight: 136 lb (61.7 kg)  Height: 5' 1 (1.549 m)   Body mass index is 25.7 kg/m.  Hearing/Vision screen No results found. Immunizations and Health Maintenance Health Maintenance  Topic Date Due   COVID-19 Vaccine (1 - 2025-26 season) Never done   Mammogram  04/21/2024   Influenza Vaccine  08/11/2024 (Originally 12/13/2023)   Medicare Annual Wellness (AWV)  04/24/2025   Bone Density Scan  07/21/2025   DTaP/Tdap/Td (3 - Td or Tdap) 01/05/2032   Pneumococcal Vaccine: 50+ Years  Completed   Hepatitis C  Screening  Completed   Zoster Vaccines- Shingrix   Completed   Meningococcal B Vaccine  Aged Out   Colonoscopy  Discontinued        Assessment/Plan:  This is a routine wellness examination for Fany.  Patient Care Team: Lavell Bari LABOR, FNP as PCP - General (Family Medicine) Ladora Ross Lacy Phebe, MD as Referring Physician (Optometry) Rehman, Claudis PENNER, MD (Inactive) as Consulting Physician (Gastroenterology) Darlis Deatrice RAMAN, MD as Consulting Physician (Pain Medicine) Meyran, Suzen Lacks, NP as Nurse Practitioner (Neurosurgery) Onetha Kuba, MD as Consulting Physician (Neurosurgery) Meyran, Suzen Lacks, NP as Nurse Practitioner (Neurosurgery)  I have personally reviewed and noted the following in the patients chart:   Medical and social history Use of alcohol , tobacco or illicit drugs  Current medications and supplements including opioid prescriptions. Functional ability and status Nutritional status Physical activity Advanced directives List of other physicians Hospitalizations, surgeries, and ER visits in previous 12 months Vitals  Screenings to include cognitive, depression, and falls Referrals and appointments  No orders of the defined types were placed in this encounter.  In addition, I have reviewed and discussed with patient certain preventive protocols, quality metrics, and best practice recommendations. A written personalized care plan for preventive services as well as general preventive health recommendations were provided to patient.   Ozie Ned, CMA   04/24/2024   Return in 1 year (on 04/24/2025).  After Visit Summary: (MyChart) Due to this being a telephonic visit, the after visit summary with patients personalized plan was offered to patient via MyChart   Nurse Notes: Mammogram scheduled for 05/04/24 and pt declined covid/flu vaccine

## 2024-04-24 NOTE — Patient Instructions (Signed)
 Ms. Cheryl Lee,  Thank you for taking the time for your Medicare Wellness Visit. I appreciate your continued commitment to your health goals. Please review the care plan we discussed, and feel free to reach out if I can assist you further.  Please note that Annual Wellness Visits do not include a physical exam. Some assessments may be limited, especially if the visit was conducted virtually. If needed, we may recommend an in-person follow-up with your provider.  Ongoing Care Seeing your primary care provider every 3 to 6 months helps us  monitor your health and provide consistent, personalized care.   Referrals If a referral was made during today's visit and you haven't received any updates within two weeks, please contact the referred provider directly to check on the status.  Recommended Screenings:  Health Maintenance  Topic Date Due   COVID-19 Vaccine (1 - 2025-26 season) Never done   Medicare Annual Wellness Visit  04/02/2024   Breast Cancer Screening  04/21/2024   Flu Shot  08/11/2024*   Osteoporosis screening with Bone Density Scan  07/21/2025   DTaP/Tdap/Td vaccine (3 - Td or Tdap) 01/05/2032   Pneumococcal Vaccine for age over 54  Completed   Hepatitis C Screening  Completed   Zoster (Shingles) Vaccine  Completed   Meningitis B Vaccine  Aged Out   Colon Cancer Screening  Discontinued  *Topic was postponed. The date shown is not the original due date.       04/24/2024    9:17 AM  Advanced Directives  Does Patient Have a Medical Advance Directive? No  Would patient like information on creating a medical advance directive? No - Patient declined    Vision: Annual vision screenings are recommended for early detection of glaucoma, cataracts, and diabetic retinopathy. These exams can also reveal signs of chronic conditions such as diabetes and high blood pressure.  Dental: Annual dental screenings help detect early signs of oral cancer, gum disease, and other conditions linked to  overall health, including heart disease and diabetes.  Please see the attached documents for additional preventive care recommendations.

## 2024-05-01 ENCOUNTER — Other Ambulatory Visit: Payer: Self-pay | Admitting: Family

## 2024-05-01 DIAGNOSIS — Z1231 Encounter for screening mammogram for malignant neoplasm of breast: Secondary | ICD-10-CM

## 2024-05-04 ENCOUNTER — Telehealth: Payer: Self-pay

## 2024-05-04 ENCOUNTER — Other Ambulatory Visit: Payer: Self-pay

## 2024-05-04 ENCOUNTER — Encounter (HOSPITAL_COMMUNITY): Payer: Self-pay

## 2024-05-04 ENCOUNTER — Ambulatory Visit: Payer: Self-pay

## 2024-05-04 ENCOUNTER — Inpatient Hospital Stay: Admission: RE | Admit: 2024-05-04 | Source: Ambulatory Visit

## 2024-05-04 ENCOUNTER — Emergency Department (HOSPITAL_COMMUNITY)
Admission: EM | Admit: 2024-05-04 | Discharge: 2024-05-04 | Disposition: A | Discharge status: Home / Self Care | Attending: Emergency Medicine | Admitting: Emergency Medicine

## 2024-05-04 DIAGNOSIS — N179 Acute kidney failure, unspecified: Secondary | ICD-10-CM | POA: Insufficient documentation

## 2024-05-04 DIAGNOSIS — R1032 Left lower quadrant pain: Secondary | ICD-10-CM | POA: Diagnosis present

## 2024-05-04 DIAGNOSIS — Z7982 Long term (current) use of aspirin: Secondary | ICD-10-CM | POA: Diagnosis not present

## 2024-05-04 DIAGNOSIS — B372 Candidiasis of skin and nail: Secondary | ICD-10-CM | POA: Diagnosis not present

## 2024-05-04 LAB — COMPREHENSIVE METABOLIC PANEL WITH GFR
ALT: 16 U/L (ref 0–44)
AST: 23 U/L (ref 15–41)
Albumin: 4.2 g/dL (ref 3.5–5.0)
Alkaline Phosphatase: 58 U/L (ref 38–126)
Anion gap: 13 (ref 5–15)
BUN: 27 mg/dL — ABNORMAL HIGH (ref 8–23)
CO2: 25 mmol/L (ref 22–32)
Calcium: 10.2 mg/dL (ref 8.9–10.3)
Chloride: 102 mmol/L (ref 98–111)
Creatinine, Ser: 1.52 mg/dL — ABNORMAL HIGH (ref 0.44–1.00)
GFR, Estimated: 35 mL/min — ABNORMAL LOW
Glucose, Bld: 97 mg/dL (ref 70–99)
Potassium: 4.5 mmol/L (ref 3.5–5.1)
Sodium: 140 mmol/L (ref 135–145)
Total Bilirubin: 0.5 mg/dL (ref 0.0–1.2)
Total Protein: 6.7 g/dL (ref 6.5–8.1)

## 2024-05-04 LAB — CBC WITH DIFFERENTIAL/PLATELET
Abs Immature Granulocytes: 0.03 K/uL (ref 0.00–0.07)
Basophils Absolute: 0 K/uL (ref 0.0–0.1)
Basophils Relative: 0 %
Eosinophils Absolute: 0 K/uL (ref 0.0–0.5)
Eosinophils Relative: 0 %
HCT: 36.9 % (ref 36.0–46.0)
Hemoglobin: 12 g/dL (ref 12.0–15.0)
Immature Granulocytes: 0 %
Lymphocytes Relative: 17 %
Lymphs Abs: 1.7 K/uL (ref 0.7–4.0)
MCH: 33.8 pg (ref 26.0–34.0)
MCHC: 32.5 g/dL (ref 30.0–36.0)
MCV: 103.9 fL — ABNORMAL HIGH (ref 80.0–100.0)
Monocytes Absolute: 0.6 K/uL (ref 0.1–1.0)
Monocytes Relative: 6 %
Neutro Abs: 8 K/uL — ABNORMAL HIGH (ref 1.7–7.7)
Neutrophils Relative %: 77 %
Platelets: 280 K/uL (ref 150–400)
RBC: 3.55 MIL/uL — ABNORMAL LOW (ref 3.87–5.11)
RDW: 12.8 % (ref 11.5–15.5)
WBC: 10.4 K/uL (ref 4.0–10.5)
nRBC: 0 % (ref 0.0–0.2)

## 2024-05-04 LAB — URINALYSIS, ROUTINE W REFLEX MICROSCOPIC
Bilirubin Urine: NEGATIVE
Glucose, UA: NEGATIVE mg/dL
Hgb urine dipstick: NEGATIVE
Ketones, ur: 20 mg/dL — AB
Nitrite: NEGATIVE
Protein, ur: NEGATIVE mg/dL
Specific Gravity, Urine: 1.013 (ref 1.005–1.030)
pH: 5 (ref 5.0–8.0)

## 2024-05-04 MED ORDER — CLOTRIMAZOLE 1 % EX CREA: TOPICAL_CREAM | CUTANEOUS | 0 refills | Status: AC

## 2024-05-04 MED ORDER — CLOTRIMAZOLE 1 % EX CREA
TOPICAL_CREAM | Freq: Once | CUTANEOUS | Status: AC
  Filled 2024-05-04: qty 15

## 2024-05-04 MED ORDER — CLOTRIMAZOLE 1 % VA CREA: 1.0000 | TOPICAL_CREAM | Freq: Every day | VAGINAL | 0 refills | Status: AC

## 2024-05-04 MED ADMIN — Lactated Ringer's Solution: 1000 mL | INTRAVENOUS | NDC 00264775000

## 2024-05-04 NOTE — Telephone Encounter (Signed)
Noted  -LS

## 2024-05-04 NOTE — Discharge Instructions (Addendum)
 Follow-up with your doctor to get your kidney function rechecked.  Make sure you drink increased amounts of fluids at home.  You may need some of your medications adjusted as well.  Follow-up with your primary care doctor for the rash.  Apply the cream twice per day.  If you develop fever, new or worsening rash, or any other new/concerning symptoms then return to the ER.

## 2024-05-04 NOTE — ED Provider Notes (Signed)
 " Cheryl Lee Provider Note   CSN: [id number] Arrival date & time: 05/04/24  9045     Patient presents with: Wound Check   Cheryl Lee is a 78 y.o. female.   HPI 78 year old female presents with left groin pain and a wound.  She first noticed it 4 days ago without trauma.  She has not had any systemic symptoms such as fever.  She is able to walk but is painful to move her leg or touch the area.  She thinks has been draining some clear fluid. No vaginal discharge but there has been a little redness around her vaginal opening.  Prior to Admission medications  Medication Sig Start Date End Date Taking? Authorizing Provider  acetaminophen  (TYLENOL ) 650 MG CR tablet Take 1,300 mg by mouth every 8 (eight) hours as needed for pain.    [provider]  Ascorbic Acid  (VITAMIN C ) 500 MG CAPS Take 500 mg by mouth daily.    [provider]  aspirin  81 MG tablet Take 1 tablet (81 mg total) by mouth daily. 01/22/20   Rehman, Claudis PENNER, MD  atorvastatin  (LIPITOR) 10 MG tablet Take 1 tablet (10 mg total) by mouth daily. 03/24/24   Lavell Lye A, FNP  clobetasol  cream (TEMOVATE ) 0.05 % Apply 1 Application topically 2 (two) times daily. 03/24/24   Lavell Lye DELENA, FNP  fluticasone  (FLONASE ) 50 MCG/ACT nasal spray Place 2 sprays into both nostrils daily. 09/05/23   Lavell Lye DELENA, FNP  lisinopril -hydrochlorothiazide  (ZESTORETIC ) 20-12.5 MG tablet Take 0.5 tablets by mouth daily. 04/02/24   Lavell Lye DELENA, FNP  Multiple Vitamins-Minerals (MULTIVITAMINS THER. W/MINERALS) TABS Take 1 tablet by mouth daily.    [provider]  omeprazole  (PRILOSEC) 20 MG capsule TAKE 1 CAPSULE BY MOUTH EVERY DAY 04/21/24   Hawks, Lye A, FNP  Polyethyl Glycol-Propyl Glycol (SYSTANE) 0.4-0.3 % SOLN Place 1 drop into both eyes daily as needed (Dry eye).    [provider]  trolamine salicylate (ASPERCREME) 10 % cream Apply 1 Application  topically daily as needed for muscle pain (cramps).    [provider]    Allergies: Lycopene    Review of Systems  Constitutional:  Negative for fever.  Skin:  Positive for wound.    Updated Vital Signs BP 117/73 (BP Location: Left Arm)   Pulse 97   Temp 97.8 F (36.6 C) (Oral)   Resp 16   Ht 5' (1.524 m)   Wt 61.2 kg   LMP 08/05/1991   SpO2 100%   BMI 26.37 kg/m   Physical Exam Vitals and nursing note reviewed. Exam conducted with a chaperone present.  Constitutional:      Appearance: She is well-developed.  HENT:     Head: Normocephalic and atraumatic.  Cardiovascular:     Rate and Rhythm: Normal rate and regular rhythm.     Pulses:          Dorsalis pedis pulses are 2+ on the left side.  Pulmonary:     Effort: Pulmonary effort is normal.  Genitourinary:    Comments: Small amount of erythema at her vaginal opening Musculoskeletal:     Left hip: No deformity or tenderness. Normal range of motion.       Legs:  Skin:    General: Skin is warm and dry.  Neurological:     Mental Status: She is alert.     (all labs ordered are listed, but only abnormal  results are displayed) Labs Reviewed  COMPREHENSIVE METABOLIC PANEL WITH GFR - Abnormal; Notable for the following components:      Result Value   BUN 27 (*)    Creatinine, Ser 1.52 (*)    GFR, Estimated 35 (*)    All other components within normal limits  CBC WITH DIFFERENTIAL/PLATELET  CBC WITH DIFFERENTIAL/PLATELET  URINALYSIS, ROUTINE W REFLEX MICROSCOPIC    EKG: None  Radiology: No results found.   Procedures   Medications Ordered in the ED  clotrimazole  (LOTRIMIN ) 1 % cream ( Topical Given 05/04/24 1104)  lactated ringers  bolus 1,000 mL (1,000 mLs Intravenous New Bag/Given 05/04/24 1205)                                    Medical Decision Making Amount and/or Complexity of Data Reviewed Labs: ordered.    Details: Mild AKI. Normal WBC.  Risk OTC drugs.   Exam is consistent  with candidal intertrigo.  She was started on topical therapy.  She had borderline tachycardia but reports no other complaint such as vomiting, fever, etc.  Is noted to have a mild AKI, so she was given some fluids.  She denies acute dehydration but states she has not really drank much today.  WBC is normal.  Exam is not consistent with cellulitis or abscess.  Highly doubt a deep space infection.  Vital signs are now normal.  Will have her follow-up with her PCP to get her kidney function rechecked.  She otherwise appears stable for discharge home with return precautions.  Urine shows some small leukocytes but not consistent with a UTI and she has no UTI symptoms.     Final diagnoses:  Candidal intertrigo    ED Discharge Orders          Ordered    clotrimazole  (LOTRIMIN ) 1 % cream        Pending    clotrimazole  (GYNE-LOTRIMIN ) 1 % vaginal cream  Daily at bedtime        Pending               [name], MD 05/04/24 1424  "

## 2024-05-04 NOTE — Telephone Encounter (Signed)
 Noted and added not to missed [facility] appt

## 2024-05-04 NOTE — Telephone Encounter (Signed)
 Copied from CRM [id number]. Topic: Appointments - Scheduling Inquiry for Clinic >> May 04, 2024  2:10 PM Donna BRAVO wrote: Reason for CRM: Patient daughter calling. Patient is in the ER and is  unable to make it to mammogram appt   Avelina Patient daughter will call back to reschedule appt   Called  [facility] no answer

## 2024-05-04 NOTE — ED Triage Notes (Signed)
 Pt reports wound to left inner thigh since Thursday. Wound is worsening, painful when walking. Draining clear fluid, reports the wound is the size of 2 quarters and is red all around the wound. Denies fever, chills, body aches.

## 2024-05-04 NOTE — Telephone Encounter (Signed)
 FYI Only or Action Required?: FYI only for provider: ED advised.  Patient was last seen in primary care on 03/24/2024 by Lavell Bari LABOR, FNP.  Called Nurse Triage reporting Sore.  Symptoms began several days ago.  Triage Disposition: Go to ED Now (or PCP Triage)  Patient/caregiver understands and will follow disposition?: Yes                  Copied from CRM #[id number]. Topic: Clinical - Red Word Triage >> May 04, 2024  8:05 AM Olam RAMAN wrote: Red Word that prompted transfer to Nurse Triage: Has a spot inside of L leg, bloody red and sore, cant walk Reason for Disposition  Black (necrotic), dark purple, or blisters develop in area of boil (abscess)  Answer Assessment - Initial Assessment Questions This RN recommends pt goes to ED and has another adult drive pt there. Pt states she will get her daughter to take her there.  Spot on inside of L leg, in crease next to groin Onset: started on Thursday Bloody red in color, drains some (feels sticky, color is probably clear), really sore (when walking 7-8/10 pain level), white blisters Hx of stenosis Size of 50 cent piece Can't hardly walk or raise leg  Denies: fever, rash surrounding it  Protocols used: Boil (Skin Abscess)-A-AH

## 2024-05-04 NOTE — ED Notes (Signed)
 Patient informed that urine sample is needed. Toileting offered. Patient states unable to void at this time

## 2024-05-08 ENCOUNTER — Encounter: Payer: Self-pay | Admitting: Family

## 2024-05-08 ENCOUNTER — Ambulatory Visit: Admitting: Family

## 2024-05-08 VITALS — BP 120/81 | HR 103 | Temp 97.1°F | Ht 60.0 in | Wt 137.4 lb

## 2024-05-08 DIAGNOSIS — Z09 Encounter for follow-up examination after completed treatment for conditions other than malignant neoplasm: Secondary | ICD-10-CM | POA: Diagnosis not present

## 2024-05-08 DIAGNOSIS — R7989 Other specified abnormal findings of blood chemistry: Secondary | ICD-10-CM | POA: Diagnosis not present

## 2024-05-08 DIAGNOSIS — B372 Candidiasis of skin and nail: Secondary | ICD-10-CM

## 2024-05-08 LAB — BMP8+EGFR
BUN/Creatinine Ratio: 19 (ref 12–28)
BUN: 22 mg/dL (ref 8–27)
CO2: 22 mmol/L (ref 20–29)
Calcium: 9.7 mg/dL (ref 8.7–10.3)
Chloride: 103 mmol/L (ref 96–106)
Creatinine, Ser: 1.13 mg/dL — ABNORMAL HIGH (ref 0.57–1.00)
Glucose: 101 mg/dL — ABNORMAL HIGH (ref 70–99)
Potassium: 4.2 mmol/L (ref 3.5–5.2)
Sodium: 140 mmol/L (ref 134–144)
eGFR: 50 mL/min/1.73 — ABNORMAL LOW

## 2024-05-08 MED ORDER — NYSTATIN 100000 UNIT/GM EX POWD: 1.0000 | Freq: Three times a day (TID) | CUTANEOUS | 0 refills | Status: AC

## 2024-05-08 MED ORDER — FLUCONAZOLE 150 MG PO TABS: 150.0000 mg | ORAL_TABLET | ORAL | 0 refills | Status: AC

## 2024-05-08 NOTE — Progress Notes (Signed)
 "  Subjective:    Patient ID: Cheryl Lee, female    DOB: 08/19/1945, 78 y.o.   MRN: [id number]  Chief Complaint  Patient presents with   Rash   PT presents to the office today for ED hospital follow up for rash in left groin.  She went to the ED on 05/04/24 and diagnosed with candidal intertrigo. She was given clotrimazole  cream with mild relief.   Reports the rash is slightly improve where it started, but has started spreading up her buttocks.   Rash This is a new problem. The current episode started 1 to 4 weeks ago. The problem has been waxing and waning since onset. The affected locations include the groin. The rash is characterized by redness and burning.      Review of Systems  Skin:  Positive for rash.  All other systems reviewed and are negative.   Social History   Socioeconomic History   Marital status: Divorced    Spouse name: Not on file   Number of children: 1   Years of education: 12   Highest education level: 12th grade  Occupational History   Occupation: retired  Tobacco Use   Smoking status: Never   Smokeless tobacco: Never  Vaping Use   Vaping status: Never Used  Substance and Sexual Activity   Alcohol  use: No   Drug use: No   Sexual activity: Not Currently  Other Topics Concern   Not on file  Social History Narrative   Lives alone. Daughter lives nearby   Brother lives next door.   Social Drivers of Health   Tobacco Use: Low Risk (05/04/2024)   Patient History    Smoking Tobacco Use: Never    Smokeless Tobacco Use: Never    Passive Exposure: Not on file  Financial Resource Strain: Low Risk (03/22/2024)   Overall Financial Resource Strain (CARDIA)    Difficulty of Paying Living Expenses: Not hard at all  Food Insecurity: No Food Insecurity (04/24/2024)   Epic    Worried About Radiation Protection Practitioner of Food in the Last Year: Never true    Ran Out of Food in the Last Year: Never true  Transportation Needs: No Transportation Needs (04/24/2024)    Epic    Lack of Transportation (Medical): No    Lack of Transportation (Non-Medical): No  Physical Activity: Insufficiently Active (04/24/2024)   Exercise Vital Sign    Days of Exercise per Week: 2 days    Minutes of Exercise per Session: 20 min  Stress: No Stress Concern Present (04/24/2024)   [employer] of Occupational Health - Occupational Stress Questionnaire    Feeling of Stress: Not at all  Social Connections: Moderately Isolated (04/24/2024)   Social Connection and Isolation Panel    Frequency of Communication with Friends and Family: Three times a week    Frequency of Social Gatherings with Friends and Family: Once a week    Attends Religious Services: 1 to 4 times per year    Active Member of Clubs or Organizations: No    Attends Banker Meetings: Never    Marital Status: Divorced  Depression (PHQ2-9): Medium Risk (05/08/2024)   Depression (PHQ2-9)    PHQ-2 Score: 7  Alcohol  Screen: Low Risk (04/03/2023)   Alcohol  Screen    Last Alcohol  Screening Score (AUDIT): 0  Housing: Low Risk (04/24/2024)   Epic    Unable to Pay for Housing in the Last Year: No    Number of Times Moved in the Last  Year: 0    Homeless in the Last Year: No  Utilities: Not At Risk (04/24/2024)   Epic    Threatened with loss of utilities: No  Health Literacy: Adequate Health Literacy (04/24/2024)   B1300 Health Literacy    Frequency of need for help with medical instructions: Never   Family History  Problem Relation Age of Onset   Cancer Mother    Alzheimer's disease Mother    Heart disease Mother    Early death Father    Diabetes Sister    Diabetes Sister    Cancer Sister 6       uterine   Hypertension Sister    Healthy Daughter    Stroke Brother    Breast cancer Neg Hx         Objective:   Physical Exam Vitals reviewed.  Constitutional:      General: She is not in acute distress.    Appearance: She is well-developed.  HENT:     Head: Normocephalic and  atraumatic.  Eyes:     Pupils: Pupils are equal, round, and reactive to light.  Neck:     Thyroid : No thyromegaly.  Cardiovascular:     Rate and Rhythm: Normal rate and regular rhythm.     Heart sounds: Normal heart sounds. No murmur heard. Pulmonary:     Effort: Pulmonary effort is normal. No respiratory distress.     Breath sounds: Normal breath sounds. No wheezing.  Abdominal:     General: Bowel sounds are normal. There is no distension.     Palpations: Abdomen is soft.     Tenderness: There is no abdominal tenderness.  Genitourinary:     Comments: Erythemas rash, left groin with broken skin,  Musculoskeletal:        General: No tenderness. Normal range of motion.     Cervical back: Normal range of motion and neck supple.  Skin:    General: Skin is warm and dry.  Neurological:     Mental Status: She is alert and oriented to person, place, and time.     Cranial Nerves: No cranial nerve deficit.     Deep Tendon Reflexes: Reflexes are normal and symmetric.  Psychiatric:        Behavior: Behavior normal.        Thought Content: Thought content normal.        Judgment: Judgment normal.       BP 120/81   Pulse (!) 103   Temp (!) 97.1 F (36.2 C) (Temporal)   Ht 5' (1.524 m)   Wt 137 lb 6.4 oz (62.3 kg)   LMP 08/05/1991   SpO2 96%   BMI 26.83 kg/m      Assessment & Plan:  Cheryl Lee comes in today with chief complaint of Rash   Diagnosis and orders addressed:  1. Candidal intertrigo (Primary) Continue clotrimazole  cream but add  nystatin  powder Start diflucan  every 3 days Keep clean and dry Open to air as much as possible  - nystatin  (MYCOSTATIN /NYSTOP ) powder; Apply 1 Application topically 3 (three) times daily.  Dispense: 60 g; Refill: 0 - fluconazole  (DIFLUCAN ) 150 MG tablet; Take 1 tablet (150 mg total) by mouth every 3 (three) days.  Dispense: 3 tablet; Refill: 0  2. Hospital discharge follow-up Hospital notes reviewed   3. Elevated serum  creatinine BMP pending  Avoid NSAID's  - BMP8+EGFR    Bari Learn, FNP   "

## 2024-05-08 NOTE — Patient Instructions (Signed)
Skin Yeast Infection  A skin yeast infection is a condition in which there is an overgrowth of yeast (Candida) that normally lives on the skin. This condition usually occurs in areas of the skin that are constantly warm and moist, such as the skin under the breasts or armpits, or in the groin and other body folds. What are the causes? This condition is caused by a change in the normal balance of the yeast that live on the skin. What increases the risk? You are more likely to develop this condition if you: Are obese. Are pregnant. Are 44 years of age or older. Wear tight clothing. Have any of the following conditions: Diabetes. Malnutrition. A weak body defense system (immune system). Take medicines such as: Birth control pills. Antibiotics. Steroid medicines. What are the signs or symptoms? The most common symptom of this condition is itchiness in the affected area. Other symptoms include: A red, swollen area of the skin. Bumps on the skin. How is this diagnosed? This condition is diagnosed with a medical history and physical exam. Your health care provider may check for yeast by taking scrapings of the skin to be viewed under a microscope. How is this treated? This condition is treated with medicine. Medicines may be prescribed or available over the counter. The medicines may be: Taken by mouth (orally). Applied as a cream or powder to your skin. Follow these instructions at home:  Take or apply over-the-counter and prescription medicines only as told by your health care provider. Maintain a healthy weight. If you need help losing weight, talk with your health care provider. Keep your skin clean and dry. Wear loose-fitting clothing. If you have diabetes, keep your blood sugar under control. Keep all follow-up visits. This is important. Contact a health care provider if: Your symptoms go away and then come back. Your symptoms do not get better with treatment. Your symptoms get  worse. Your rash spreads. You have a fever or chills. You have new symptoms. You have new warmth or redness of your skin. Your rash is painful or bleeding. Summary A skin yeast infection is a condition in which there is an overgrowth of yeast (Candida) that normally lives on the skin. Take or apply over-the-counter and prescription medicines only as told by your health care provider. Keep your skin clean and dry. Contact a health care provider if your symptoms do not get better with treatment. This information is not intended to replace advice given to you by your health care provider. Make sure you discuss any questions you have with your health care provider. Document Revised: 07/19/2020 Document Reviewed: 07/19/2020 Elsevier Patient Education  2024 [employer].

## 2024-05-11 ENCOUNTER — Ambulatory Visit: Payer: Self-pay | Admitting: Family

## 2024-05-18 ENCOUNTER — Ambulatory Visit
Admission: RE | Admit: 2024-05-18 | Discharge: 2024-05-18 | Disposition: A | Discharge status: Home / Self Care | Source: Ambulatory Visit | Attending: Family | Admitting: Family

## 2024-05-18 DIAGNOSIS — Z1231 Encounter for screening mammogram for malignant neoplasm of breast: Secondary | ICD-10-CM

## 2024-07-24 ENCOUNTER — Ambulatory Visit: Admitting: Family

## 2025-04-27 ENCOUNTER — Ambulatory Visit
